# Patient Record
Sex: Male | Born: 1960
Health system: Southern US, Community
[De-identification: ages and names within clinical notes are randomized; demographics above are authoritative.]

## PROBLEM LIST (undated history)

## (undated) DIAGNOSIS — K759 Inflammatory liver disease, unspecified: Secondary | ICD-10-CM

## (undated) DIAGNOSIS — E785 Hyperlipidemia, unspecified: Secondary | ICD-10-CM

## (undated) DIAGNOSIS — J189 Pneumonia, unspecified organism: Secondary | ICD-10-CM

## (undated) DIAGNOSIS — I10 Essential (primary) hypertension: Secondary | ICD-10-CM

## (undated) DIAGNOSIS — R7303 Prediabetes: Secondary | ICD-10-CM

---

## 2018-11-17 ENCOUNTER — Inpatient Hospital Stay (HOSPITAL_COMMUNITY)
Admission: EM | Admit: 2018-11-17 | Discharge: 2018-11-27 | DRG: 177 | Disposition: A | Discharge status: Home / Self Care | Payer: HRSA Program | Attending: Internal Medicine | Admitting: Internal Medicine

## 2018-11-17 ENCOUNTER — Other Ambulatory Visit: Payer: Self-pay

## 2018-11-17 ENCOUNTER — Encounter (HOSPITAL_COMMUNITY): Payer: Self-pay | Admitting: *Deleted

## 2018-11-17 ENCOUNTER — Emergency Department (HOSPITAL_COMMUNITY): Payer: HRSA Program

## 2018-11-17 DIAGNOSIS — R05 Cough: Secondary | ICD-10-CM

## 2018-11-17 DIAGNOSIS — J069 Acute upper respiratory infection, unspecified: Secondary | ICD-10-CM

## 2018-11-17 DIAGNOSIS — R0602 Shortness of breath: Secondary | ICD-10-CM | POA: Diagnosis not present

## 2018-11-17 DIAGNOSIS — R6889 Other general symptoms and signs: Secondary | ICD-10-CM

## 2018-11-17 DIAGNOSIS — J9601 Acute respiratory failure with hypoxia: Secondary | ICD-10-CM | POA: Diagnosis present

## 2018-11-17 DIAGNOSIS — R509 Fever, unspecified: Secondary | ICD-10-CM

## 2018-11-17 DIAGNOSIS — F17210 Nicotine dependence, cigarettes, uncomplicated: Secondary | ICD-10-CM | POA: Diagnosis present

## 2018-11-17 DIAGNOSIS — R059 Cough, unspecified: Secondary | ICD-10-CM

## 2018-11-17 DIAGNOSIS — J1289 Other viral pneumonia: Secondary | ICD-10-CM | POA: Diagnosis present

## 2018-11-17 DIAGNOSIS — Z20822 Contact with and (suspected) exposure to covid-19: Secondary | ICD-10-CM

## 2018-11-17 DIAGNOSIS — J9621 Acute and chronic respiratory failure with hypoxia: Secondary | ICD-10-CM

## 2018-11-17 DIAGNOSIS — E871 Hypo-osmolality and hyponatremia: Secondary | ICD-10-CM | POA: Diagnosis present

## 2018-11-17 DIAGNOSIS — E876 Hypokalemia: Secondary | ICD-10-CM | POA: Diagnosis present

## 2018-11-17 DIAGNOSIS — D6869 Other thrombophilia: Secondary | ICD-10-CM | POA: Diagnosis present

## 2018-11-17 DIAGNOSIS — R6510 Systemic inflammatory response syndrome (SIRS) of non-infectious origin without acute organ dysfunction: Secondary | ICD-10-CM

## 2018-11-17 LAB — CBC WITH DIFFERENTIAL/PLATELET
Abs Immature Granulocytes: 0.04 10*3/uL (ref 0.00–0.07)
Basophils Absolute: 0 10*3/uL (ref 0.0–0.1)
Basophils Relative: 0 %
Eosinophils Absolute: 0 10*3/uL (ref 0.0–0.5)
Eosinophils Relative: 0 %
HCT: 48.4 % (ref 39.0–52.0)
Hemoglobin: 16 g/dL (ref 13.0–17.0)
Immature Granulocytes: 0 %
Lymphocytes Relative: 21 %
Lymphs Abs: 2 10*3/uL (ref 0.7–4.0)
MCH: 32.1 pg (ref 26.0–34.0)
MCHC: 33.1 g/dL (ref 30.0–36.0)
MCV: 97.2 fL (ref 80.0–100.0)
Monocytes Absolute: 0.8 10*3/uL (ref 0.1–1.0)
Monocytes Relative: 9 %
Neutro Abs: 6.5 10*3/uL (ref 1.7–7.7)
Neutrophils Relative %: 70 %
Platelets: 429 10*3/uL — ABNORMAL HIGH (ref 150–400)
RBC: 4.98 MIL/uL (ref 4.22–5.81)
RDW: 12.3 % (ref 11.5–15.5)
WBC: 9.3 10*3/uL (ref 4.0–10.5)
nRBC: 0 % (ref 0.0–0.2)

## 2018-11-17 LAB — TROPONIN I: Troponin I: <0.03 ng/mL (ref <0.03)

## 2018-11-17 LAB — COMPREHENSIVE METABOLIC PANEL
ALT: 68 U/L — ABNORMAL HIGH (ref 0–44)
AST: 64 U/L — ABNORMAL HIGH (ref 15–41)
Albumin: 3.7 g/dL (ref 3.5–5.0)
Alkaline Phosphatase: 82 U/L (ref 38–126)
Anion gap: 13 (ref 5–15)
BUN: 14 mg/dL (ref 6–20)
CO2: 21 mmol/L — ABNORMAL LOW (ref 22–32)
Calcium: 8.8 mg/dL — ABNORMAL LOW (ref 8.9–10.3)
Chloride: 100 mmol/L (ref 98–111)
Creatinine, Ser: 1.13 mg/dL (ref 0.61–1.24)
GFR calc Af Amer: >60 mL/min (ref >60)
GFR calc non Af Amer: >60 mL/min (ref >60)
Glucose, Bld: 132 mg/dL — ABNORMAL HIGH (ref 70–99)
Potassium: 3.7 mmol/L (ref 3.5–5.1)
Sodium: 134 mmol/L — ABNORMAL LOW (ref 135–145)
Total Bilirubin: 1.5 mg/dL — ABNORMAL HIGH (ref 0.3–1.2)
Total Protein: 8.4 g/dL — ABNORMAL HIGH (ref 6.5–8.1)

## 2018-11-17 LAB — D-DIMER, QUANTITATIVE: D-Dimer, Quant: 0.83 ug/mL-FEU — ABNORMAL HIGH (ref 0.00–0.50)

## 2018-11-17 LAB — FERRITIN: Ferritin: 916 ng/mL — ABNORMAL HIGH (ref 24–336)

## 2018-11-17 LAB — LACTATE DEHYDROGENASE: LDH: 223 U/L — ABNORMAL HIGH (ref 98–192)

## 2018-11-17 LAB — C-REACTIVE PROTEIN: CRP: 11.7 mg/dL — ABNORMAL HIGH (ref <1.0)

## 2018-11-17 LAB — SARS CORONAVIRUS 2 BY RT PCR (HOSPITAL ORDER, PERFORMED IN ~~LOC~~ HOSPITAL LAB): SARS Coronavirus 2: POSITIVE — AB

## 2018-11-17 LAB — SEDIMENTATION RATE: Sed Rate: 64 mm/hr — ABNORMAL HIGH (ref 0–16)

## 2018-11-17 MED ORDER — HYDROXYCHLOROQUINE SULFATE 200 MG PO TABS: 200.0000 mg | ORAL_TABLET | Freq: Two times a day (BID) | ORAL | Status: DC

## 2018-11-17 MED ORDER — HYDROXYCHLOROQUINE SULFATE 200 MG PO TABS: 400.0000 mg | ORAL_TABLET | Freq: Two times a day (BID) | ORAL | Status: DC

## 2018-11-17 MED ORDER — AEROCHAMBER PLUS FLO-VU MEDIUM MISC
1.0000 | Freq: Once | Status: AC
  Administered 2018-11-17: 11:00:00 1
  Filled 2018-11-17: qty 1

## 2018-11-17 MED ORDER — ACETAMINOPHEN 325 MG PO TABS
650.0000 mg | ORAL_TABLET | Freq: Four times a day (QID) | ORAL | Status: DC | PRN
  Administered 2018-11-18 – 2018-11-22 (×5): 650 mg via ORAL
  Filled 2018-11-17 (×5): qty 2

## 2018-11-17 MED ORDER — ONDANSETRON HCL 4 MG/2ML IJ SOLN: 4.0000 mg | Freq: Four times a day (QID) | INTRAMUSCULAR | Status: DC | PRN

## 2018-11-17 MED ORDER — SODIUM CHLORIDE 0.9 % IV SOLN
500.0000 mg | Freq: Once | INTRAVENOUS | Status: AC
  Administered 2018-11-17: 12:00:00 500 mg via INTRAVENOUS
  Filled 2018-11-17: qty 500

## 2018-11-17 MED ORDER — HYDROXYCHLOROQUINE SULFATE 200 MG PO TABS
200.0000 mg | ORAL_TABLET | Freq: Two times a day (BID) | ORAL | Status: AC
  Administered 2018-11-18 – 2018-11-22 (×8): 200 mg via ORAL
  Filled 2018-11-17 (×9): qty 1

## 2018-11-17 MED ORDER — ALBUTEROL SULFATE HFA 108 (90 BASE) MCG/ACT IN AERS
2.0000 | INHALATION_SPRAY | Freq: Once | RESPIRATORY_TRACT | Status: AC
  Administered 2018-11-17: 11:00:00 2 via RESPIRATORY_TRACT
  Filled 2018-11-17: qty 6.7

## 2018-11-17 MED ORDER — ENOXAPARIN SODIUM 40 MG/0.4ML ~~LOC~~ SOLN
40.0000 mg | SUBCUTANEOUS | Status: DC
  Administered 2018-11-17 – 2018-11-23 (×7): 40 mg via SUBCUTANEOUS
  Filled 2018-11-17 (×7): qty 0.4

## 2018-11-17 MED ORDER — SODIUM CHLORIDE 0.9 % IV SOLN
1.0000 g | Freq: Once | INTRAVENOUS | Status: AC
  Administered 2018-11-17: 12:00:00 1 g via INTRAVENOUS
  Filled 2018-11-17: qty 10

## 2018-11-17 MED ORDER — SODIUM CHLORIDE 0.9 % IV SOLN
500.0000 mg | INTRAVENOUS | Status: DC
  Administered 2018-11-18 – 2018-11-24 (×7): 500 mg via INTRAVENOUS
  Filled 2018-11-17 (×8): qty 500

## 2018-11-17 MED ORDER — ALBUTEROL SULFATE HFA 108 (90 BASE) MCG/ACT IN AERS
2.0000 | INHALATION_SPRAY | Freq: Four times a day (QID) | RESPIRATORY_TRACT | Status: DC
  Administered 2018-11-17 – 2018-11-18 (×2): 2 via RESPIRATORY_TRACT
  Filled 2018-11-17: qty 6.7

## 2018-11-17 MED ORDER — NICOTINE 21 MG/24HR TD PT24
21.0000 mg | MEDICATED_PATCH | Freq: Every day | TRANSDERMAL | Status: DC
  Administered 2018-11-17 – 2018-11-27 (×11): 21 mg via TRANSDERMAL
  Filled 2018-11-17 (×13): qty 1

## 2018-11-17 MED ORDER — HYDROXYCHLOROQUINE SULFATE 200 MG PO TABS
400.0000 mg | ORAL_TABLET | Freq: Two times a day (BID) | ORAL | Status: AC
  Administered 2018-11-17 – 2018-11-18 (×2): 400 mg via ORAL
  Filled 2018-11-17: qty 2

## 2018-11-17 MED ORDER — SODIUM CHLORIDE 0.9 % IV SOLN
1.0000 g | INTRAVENOUS | Status: DC
  Administered 2018-11-18 – 2018-11-25 (×8): 1 g via INTRAVENOUS
  Filled 2018-11-17: qty 1
  Filled 2018-11-17: qty 10
  Filled 2018-11-17 (×2): qty 1
  Filled 2018-11-17: qty 10
  Filled 2018-11-17 (×3): qty 1

## 2018-11-17 MED ORDER — ALBUTEROL SULFATE HFA 108 (90 BASE) MCG/ACT IN AERS
2.0000 | INHALATION_SPRAY | Freq: Once | RESPIRATORY_TRACT | Status: AC
  Administered 2018-11-17: 12:00:00 2 via RESPIRATORY_TRACT
  Filled 2018-11-17: qty 6.7

## 2018-11-17 MED ORDER — ONDANSETRON HCL 4 MG PO TABS: 4.0000 mg | ORAL_TABLET | Freq: Four times a day (QID) | ORAL | Status: DC | PRN

## 2018-11-17 MED ORDER — POLYVINYL ALCOHOL 1.4 % OP SOLN
1.0000 [drp] | OPHTHALMIC | Status: DC | PRN
  Filled 2018-11-17 (×2): qty 15

## 2018-11-17 NOTE — ED Notes (Signed)
Report given to [facility]

## 2018-11-17 NOTE — ED Notes (Signed)
ED TO INPATIENT HANDOFF REPORT  ED Nurse Name and Phone #: [phone]  S Name/Age/Gender Micheal Watson 58 y.o. male Room/Bed: WA20/WA20  Code Status   Code Status: Not on file  Home/SNF/Other Home Patient oriented to: self, place, time and situation Is this baseline? Yes   Triage Complete: Triage complete  Chief Complaint headache;chest pain  Triage Note Pt complains of cough, shortness of breath, fever, body aches for the past 5 days. Pt states all 4 people that live with him have the same symptoms   Allergies No Known Allergies  Level of Care/Admitting Diagnosis ED Disposition    ED Disposition Condition [place]: [place] [[id number]]  Level of Care: Med-Surg [16]  Diagnosis: Suspected Covid-19 Virus Infection [[id number]]  Admitting Physician: Hosie Poisson [[id number]]  Attending Physician: Hosie Poisson [[id number]]  Possible Covid Disease Patient Isolation: High Risk (Aerosolizing procedure, nebulizer, intubated/ventilation, CPAP/BiPAP)  PT Class (Do Not Modify): Observation [104]  PT Acc Code (Do Not Modify): Observation [10022]       B Medical/Surgery History History reviewed. No pertinent past medical history. History reviewed. No pertinent surgical history.   A IV Location/Drains/Wounds Patient Lines/Drains/Airways Status   Active Line/Drains/Airways    Name:   Placement date:   Placement time:   Site:   Days:   Peripheral IV 11/17/18 Right Antecubital   11/17/18    1014    Antecubital   less than 1          Intake/Output Last 24 hours No intake or output data in the 24 hours ending 11/17/18 1342  Labs/Imaging Results for orders placed or performed during the hospital encounter of 11/17/18 (from the past 48 hour(s))  CBC with Differential     Status: Abnormal   Collection Time: 11/17/18  9:51 AM  Result Value Ref Range   WBC 9.3 4.0 - 10.5 K/uL   RBC 4.98 4.22 - 5.81 MIL/uL   Hemoglobin 16.0 13.0 -  17.0 g/dL   HCT 48.4 39.0 - 52.0 %   MCV 97.2 80.0 - 100.0 fL   MCH 32.1 26.0 - 34.0 pg   MCHC 33.1 30.0 - 36.0 g/dL   RDW 12.3 11.5 - 15.5 %   Platelets 429 (H) 150 - 400 K/uL   nRBC 0.0 0.0 - 0.2 %   Neutrophils Relative % 70 %   Neutro Abs 6.5 1.7 - 7.7 K/uL   Lymphocytes Relative 21 %   Lymphs Abs 2.0 0.7 - 4.0 K/uL   Monocytes Relative 9 %   Monocytes Absolute 0.8 0.1 - 1.0 K/uL   Eosinophils Relative 0 %   Eosinophils Absolute 0.0 0.0 - 0.5 K/uL   Basophils Relative 0 %   Basophils Absolute 0.0 0.0 - 0.1 K/uL   Immature Granulocytes 0 %   Abs Immature Granulocytes 0.04 0.00 - 0.07 K/uL    Comment: Performed at [facility], [place] [address], [place], [place] [address]  Comprehensive metabolic panel     Status: Abnormal   Collection Time: 11/17/18  9:51 AM  Result Value Ref Range   Sodium 134 (L) 135 - 145 mmol/L   Potassium 3.7 3.5 - 5.1 mmol/L   Chloride 100 98 - 111 mmol/L   CO2 21 (L) 22 - 32 mmol/L   Glucose, Bld 132 (H) 70 - 99 mg/dL   BUN 14 6 - 20 mg/dL   Creatinine, Ser 1.13 0.61 - 1.24 mg/dL   Calcium 8.8 (L) 8.9 -  10.3 mg/dL   Total Protein 8.4 (H) 6.5 - 8.1 g/dL   Albumin 3.7 3.5 - 5.0 g/dL   AST 64 (H) 15 - 41 U/L   ALT 68 (H) 0 - 44 U/L   Alkaline Phosphatase 82 38 - 126 U/L   Total Bilirubin 1.5 (H) 0.3 - 1.2 mg/dL   GFR calc non Af Amer >60 >60 mL/min   GFR calc Af Amer >60 >60 mL/min   Anion gap 13 5 - 15    Comment: Performed at [facility], [place] [address] [place], [place] [address]  Troponin I - Once     Status: None   Collection Time: 11/17/18  9:51 AM  Result Value Ref Range   Troponin I <0.03 <0.03 ng/mL    Comment: Performed at [facility], [place] [address], [place], [place] [address]   Dg Chest Port 1 View  Result Date: 11/17/2018 CLINICAL DATA:  Cough, shortness of breath and fever over the last 5 days. EXAM: PORTABLE CHEST 1 VIEW COMPARISON:  None FINDINGS: Heart size is normal.  Mediastinal shadows are normal. There is abnormal density in the left lateral costophrenic angle which could be due to lingular or lower lobe pneumonia. Question areas of hazy patchy infiltrate on the right, not definite. IMPRESSION: Abnormal density at the left lateral costophrenic angle worrisome for lingular or lower lobe pneumonia. Question other scattered areas of hazy infiltrate in the right, not definite. Electronically Signed   By: Nelson Chimes M.D.   On: 11/17/2018 10:33    Pending Labs Unresulted Labs (From admission, onward)    Start     Ordered   11/18/18 0500  CBC with Differential/Platelet  Tomorrow morning,   R     11/17/18 1341   11/18/18 0500  Comprehensive metabolic panel  Tomorrow morning,   R     11/17/18 1341   11/17/18 1339  Culture, blood (Routine X 2) w Reflex to ID Panel  BLOOD CULTURE X 2,   R     11/17/18 1338   11/17/18 1200  SARS Coronavirus 2 [facility] order, Performed in [facility] hospital lab)  (Novel Coronavirus, NAA ([place]) with precautions panel)  Once,   R     11/17/18 1200   Signed and Held  Creatinine, serum  (enoxaparin (LOVENOX)    CrCl >/= 30 ml/min)  Weekly,   R    Comments:  while on enoxaparin therapy    Signed and Held   Signed and Held  Interleukin-6, Plasma  Once,   R     Signed and Held   Signed and Held  Culture, sputum-assessment  Once,   R    Question:  Patient immune status  Answer:  Normal   Signed and Held   Signed and Held  Legionella Pneumophila Serogp 1 Ur Ag  Once,   R     Signed and Held   Signed and Held  Strep pneumoniae urinary antigen  Once,   R     Signed and Held   Visual merchandiser and Held  C-reactive protein  Daily,   R     Signed and Held   Signed and Held  CK  Daily,   R     Signed and Held   Signed and Held  Lactate dehydrogenase  Once,   R     Signed and Held   Signed and Held  Ferritin  Once,   R     Signed and  Held   Signed and Held  D-dimer, quantitative (not at [facility])  Once,   R     Signed and Held    Signed and Held  Sedimentation rate  Once,   R     Signed and Held   Signed and Held  C-reactive protein  Once,   R     Signed and Held          Vitals/Pain Today's Vitals   11/17/18 1140 11/17/18 1300 11/17/18 1307 11/17/18 1330  BP:  (!) 135/91 (!) 135/91   Pulse: 100 96 99 96  Resp: 18 (!) 21 16 (!) 27  Temp:      TempSrc:      SpO2: 91% 91% 92% 96%  PainSc:        Isolation Precautions Droplet and Contact precautions  Medications Medications  azithromycin (ZITHROMAX) 500 mg in sodium chloride 0.9 % 250 mL IVPB (has no administration in time range)  cefTRIAXone (ROCEPHIN) 1 g in sodium chloride 0.9 % 100 mL IVPB (has no administration in time range)  nicotine (NICODERM CQ - dosed in mg/24 hours) patch 21 mg (has no administration in time range)  albuterol (PROVENTIL HFA;VENTOLIN HFA) 108 (90 Base) MCG/ACT inhaler 2 puff (2 puffs Inhalation Given 11/17/18 1033)  AeroChamber Plus Flo-Vu Medium MISC 1 each (1 each Other Given 11/17/18 1033)  albuterol (PROVENTIL HFA;VENTOLIN HFA) 108 (90 Base) MCG/ACT inhaler 2 puff (2 puffs Inhalation Given 11/17/18 1200)  cefTRIAXone (ROCEPHIN) 1 g in sodium chloride 0.9 % 100 mL IVPB (1 g Intravenous New Bag/Given 11/17/18 1200)  azithromycin (ZITHROMAX) 500 mg in sodium chloride 0.9 % 250 mL IVPB (500 mg Intravenous New Bag/Given 11/17/18 1224)    Mobility walks     Focused Assessments Pulmonary Assessment Handoff:  Lung sounds: Bilateral Breath Sounds: Diminished O2 Device: Room Air O2 Flow Rate (L/min): 2 L/min      R Recommendations: See Admitting Provider Note  Report given to:   Additional Notes:

## 2018-11-17 NOTE — ED Notes (Signed)
Inhaler demonstration provided to the pt via video interpreter. Pt verbalizes understanding and denies any questions.

## 2018-11-17 NOTE — ED Provider Notes (Addendum)
[facility] DEPT Provider Note   CSN: [id number] Arrival date & time: 11/17/18  0931    History   Chief Complaint Chief Complaint  Patient presents with  . Cough  . Fever  . Shortness of Breath    HPI Micheal Watson is a 58 y.o. male.     The history is provided by the patient. A language interpreter was used ([place]).     Micheal Watson is a 58 y.o. male, with a history of tobacco use, presenting to the ED with productive cough for the past 3 to 4 days.  Accompanied by shortness of breath, subjective fever, body aches, and chest discomfort.  Chest discomfort is only present with coughing, is mild, and dissipates immediately. He has continued to work.  His housemates have similar symptoms. Denies syncope, N/V/D, consistent or exertional chest pain, dizziness, abdominal pain, upper respiratory systems, hemoptysis, or any other complaints.    History reviewed. No pertinent past medical history.  Patient Active Problem List   Diagnosis Date Noted  . Suspected Covid-19 Virus Infection 11/17/2018    History reviewed. No pertinent surgical history.      Home Medications    Prior to Admission medications   Medication Sig Start Date End Date Taking? Authorizing Provider  polyvinyl alcohol (LIQUIFILM TEARS) 1.4 % ophthalmic solution Place 1 drop into both eyes as needed for dry eyes (allergies).   Yes [provider]    Family History No family history on file.  Social History Social History   Tobacco Use  . Smoking status: Current Every Day Smoker    Packs/day: 0.30    Types: Cigarettes  Substance Use Topics  . Alcohol use: Not on file  . Drug use: Not on file     Allergies   Patient has no known allergies.   Review of Systems Review of Systems  Constitutional: Positive for fever.  HENT: Negative for congestion, rhinorrhea, sinus pressure, sinus pain, sore throat and trouble swallowing.    Respiratory: Positive for cough and shortness of breath.   Cardiovascular: Negative for leg swelling.  Gastrointestinal: Negative for abdominal pain, blood in stool, diarrhea, nausea and vomiting.  Genitourinary: Negative for dysuria and flank pain.  Musculoskeletal: Positive for myalgias. Negative for neck pain and neck stiffness.       Chest soreness with coughing.  Skin: Negative for color change and rash.  Neurological: Negative for dizziness, syncope, weakness, light-headedness and headaches.  All other systems reviewed and are negative.    Physical Exam Updated Vital Signs BP (!) 155/87 (BP Location: Left Arm)   Pulse 84   Temp 98.8 F (37.1 C) (Oral)   Resp (!) 22   Ht 5\' 9"  (1.753 m)   Wt 92.4 kg   SpO2 96%   BMI 30.08 kg/m   Physical Exam Vitals signs and nursing note reviewed.  Constitutional:      General: He is not in acute distress.    Appearance: He is well-developed. He is not diaphoretic.  HENT:     Head: Normocephalic and atraumatic.     Nose: Nose normal.     Mouth/Throat:     Mouth: Mucous membranes are moist.     Pharynx: Oropharynx is clear.  Eyes:     Conjunctiva/sclera: Conjunctivae normal.     Pupils: Pupils are equal, round, and reactive to light.  Neck:     Musculoskeletal: Normal range of motion and neck supple.  Cardiovascular:     Rate and Rhythm:  Normal rate and regular rhythm.     Pulses: Normal pulses.          Radial pulses are 2+ on the right side and 2+ on the left side.       Posterior tibial pulses are 2+ on the right side and 2+ on the left side.     Heart sounds: Normal heart sounds.     Comments: Tactile temperature in the extremities appropriate and equal bilaterally. Pulmonary:     Effort: Pulmonary effort is normal. No respiratory distress.     Breath sounds: Normal breath sounds.     Comments: No increased work of breathing.  Speaks in full sentences without difficulty.  SPO2 92 to 94% on room air. Abdominal:      Palpations: Abdomen is soft.     Tenderness: There is no abdominal tenderness. There is no guarding.  Musculoskeletal:     Right lower leg: No edema.     Left lower leg: No edema.  Lymphadenopathy:     Cervical: No cervical adenopathy.  Skin:    General: Skin is warm and dry.  Neurological:     Mental Status: He is alert and oriented to person, place, and time.  Psychiatric:        Mood and Affect: Mood and affect normal.        Speech: Speech normal.        Behavior: Behavior normal.      ED Treatments / Results  Labs (all labs ordered are listed, but only abnormal results are displayed) Labs Reviewed  SARS CORONAVIRUS 2 (HOSPITAL ORDER, [facility]) - Abnormal; Notable for the following components:      Result Value   SARS Coronavirus 2 POSITIVE (*)    All other components within normal limits  CBC WITH DIFFERENTIAL/PLATELET - Abnormal; Notable for the following components:   Platelets 429 (*)    All other components within normal limits  COMPREHENSIVE METABOLIC PANEL - Abnormal; Notable for the following components:   Sodium 134 (*)    CO2 21 (*)    Glucose, Bld 132 (*)    Calcium 8.8 (*)    Total Protein 8.4 (*)    AST 64 (*)    ALT 68 (*)    Total Bilirubin 1.5 (*)    All other components within normal limits  D-DIMER, QUANTITATIVE (NOT AT [facility]) - Abnormal; Notable for the following components:   D-Dimer, Quant 0.83 (*)    All other components within normal limits  C-REACTIVE PROTEIN - Abnormal; Notable for the following components:   CRP 11.7 (*)    All other components within normal limits  CULTURE, BLOOD (ROUTINE X 2)  CULTURE, BLOOD (ROUTINE X 2)  EXPECTORATED SPUTUM ASSESSMENT W REFEX TO RESP CULTURE  TROPONIN I  INTERLEUKIN-6, PLASMA  LEGIONELLA PNEUMOPHILA SEROGP 1 UR AG  STREP PNEUMONIAE URINARY ANTIGEN  FERRITIN  SEDIMENTATION RATE  LACTATE DEHYDROGENASE    EKG EKG Interpretation  Date/Time:  Tuesday November 17 2018  10:31:11 EDT Ventricular Rate:  85 PR Interval:    QRS Duration: 92 QT Interval:  387 QTC Calculation: 461 R Axis:   48 Text Interpretation:  Sinus rhythm No acute changes No significant change since last tracing No old tracing to compare Confirmed by Varney Biles ([id number]) on 11/17/2018 11:28:21 AM   Radiology Dg Chest Port 1 View  Result Date: 11/17/2018 CLINICAL DATA:  Cough, shortness of breath and fever over the last 5 days. EXAM:  PORTABLE CHEST 1 VIEW COMPARISON:  None FINDINGS: Heart size is normal. Mediastinal shadows are normal. There is abnormal density in the left lateral costophrenic angle which could be due to lingular or lower lobe pneumonia. Question areas of hazy patchy infiltrate on the right, not definite. IMPRESSION: Abnormal density at the left lateral costophrenic angle worrisome for lingular or lower lobe pneumonia. Question other scattered areas of hazy infiltrate in the right, not definite. Electronically Signed   By: Nelson Chimes M.D.   On: 11/17/2018 10:33    Procedures Procedures (including critical care time)  Medications Ordered in ED Medications  polyvinyl alcohol (LIQUIFILM TEARS) 1.4 % ophthalmic solution 1 drop (has no administration in time range)  enoxaparin (LOVENOX) injection 40 mg (has no administration in time range)  albuterol (PROVENTIL HFA;VENTOLIN HFA) 108 (90 Base) MCG/ACT inhaler 2 puff (has no administration in time range)  acetaminophen (TYLENOL) tablet 650 mg (has no administration in time range)  ondansetron (ZOFRAN) tablet 4 mg (has no administration in time range)    Or  ondansetron (ZOFRAN) injection 4 mg (has no administration in time range)  azithromycin (ZITHROMAX) 500 mg in sodium chloride 0.9 % 250 mL IVPB (has no administration in time range)  cefTRIAXone (ROCEPHIN) 1 g in sodium chloride 0.9 % 100 mL IVPB (has no administration in time range)  nicotine (NICODERM CQ - dosed in mg/24 hours) patch 21 mg (21 mg Transdermal Patch  Applied 11/17/18 1439)  albuterol (PROVENTIL HFA;VENTOLIN HFA) 108 (90 Base) MCG/ACT inhaler 2 puff (2 puffs Inhalation Given 11/17/18 1033)  AeroChamber Plus Flo-Vu Medium MISC 1 each (1 each Other Given 11/17/18 1033)  albuterol (PROVENTIL HFA;VENTOLIN HFA) 108 (90 Base) MCG/ACT inhaler 2 puff (2 puffs Inhalation Given 11/17/18 1200)  cefTRIAXone (ROCEPHIN) 1 g in sodium chloride 0.9 % 100 mL IVPB (0 g Intravenous Stopping Infusion hung by another clincian 11/17/18 1300)  azithromycin (ZITHROMAX) 500 mg in sodium chloride 0.9 % 250 mL IVPB (500 mg Intravenous New Bag/Given 11/17/18 1224)     Initial Impression / Assessment and Plan / ED Course  I have reviewed the triage vital signs and the nursing notes.  Pertinent labs & imaging results that were available during my care of the patient were reviewed by me and considered in my medical decision making (see chart for details).  Clinical Course as of Nov 16 1544  Tue Nov 17, 2018  1105 Patient reevaluated following albuterol inhaler treatment.  SP02 at 86%.  No increased work of breathing.  Patient places improvement in his shortness of breath.   [SJ]  1133 This may be due to dehydration with concurrent decrease in CO2 level. COVID-19 infection is a possibility for this patient and some LFT changes have been noted in other patients. He has no pain or tenderness in the right upper quadrant.  Total Bilirubin(!): 1.5 [SJ]  1203 Spoke with Dr. Karleen Hampshire, hospitalist. Agrees to admit the patient.   [SJ]  9150 Resulted after patient admitted and out of the ED.  SARS Coronavirus 2(!): POSITIVE [SJ]    Clinical Course User Index [SJ] Shawny Borkowski C, PA-C       Patient presents with cough and shortness of breath.  He is noted to have SPO2 in the high 80s to low 90s while on room air, though he is nontoxic-appearing and in no noted distress.  Required 2 L supplemental O2 to maintain adequate SPO2.  Patient voiced improvement with 4 puffs from albuterol  inhaler, though there is no improvement  in his SPO2. Chest x-ray with evidence of pneumonia.  There is suspicion for COVID-19 infection in this patient.  Due to his new oxygen requirement, he will be admitted to the hospital.  Findings and plan of care discussed with Varney Biles, MD. Dr. Kathrynn Humble personally evaluated and examined this patient.  Micheal Watson was evaluated in Emergency Department on 11/17/2018 for the symptoms described in the history of present illness. He was evaluated in the context of the global COVID-19 pandemic, which necessitated consideration that the patient might be at risk for infection with the SARS-CoV-2 virus that causes COVID-19. Institutional protocols and algorithms that pertain to the evaluation of patients at risk for COVID-19 are in a state of rapid change based on information released by regulatory bodies including the CDC and federal and state organizations. These policies and algorithms were followed during the patient's care in the ED.  Vitals:   11/17/18 0943 11/17/18 1045 11/17/18 1100 11/17/18 1140  BP: (!) 146/105  122/81   Pulse: 85 94 89 100  Resp: 18 (!) 23 (!) 21 18  Temp: 99.3 F (37.4 C)     TempSrc: Oral     SpO2: 92% (!) 86% (!) 86% 91%     Final Clinical Impressions(s) / ED Diagnoses   Final diagnoses:  Cough  Shortness of breath  Suspected Covid-19 Virus Infection    ED Discharge Orders    None       Layla Maw 11/17/18 1214    Lorayne Bender, PA-C 11/17/18 [place], Ankit, MD 11/20/18 1727

## 2018-11-17 NOTE — ED Provider Notes (Signed)
[facility] DEPT Provider Note   CSN: [id number] Arrival date & time: 11/17/18  0931    History   Chief Complaint Chief Complaint  Patient presents with  . Cough  . Fever  . Shortness of Breath    HPI Micheal Watson is a 58 y.o. male.     The history is provided by the patient. A language interpreter was used ([place]).    Micheal Watson is a 58 y.o. male, with a history of tobacco use, presenting to the ED with productive cough for the past 3 to 4 days.  Accompanied by shortness of breath, subjective fever, body aches, and chest discomfort.  Chest discomfort is only present with coughing, is mild, and dissipates immediately. He has continued to work.  His housemates have similar symptoms. Denies syncope, N/V/D, consistent or exertional chest pain, dizziness, abdominal pain, upper respiratory systems, hemoptysis, or any other complaints.  History reviewed. No pertinent past medical history.  Patient Active Problem List   Diagnosis Date Noted  . Suspected Covid-19 Virus Infection 11/17/2018    History reviewed. No pertinent surgical history.      Home Medications    Prior to Admission medications   Medication Sig Start Date End Date Taking? Authorizing Provider  polyvinyl alcohol (LIQUIFILM TEARS) 1.4 % ophthalmic solution Place 1 drop into both eyes as needed for dry eyes (allergies).   Yes [provider]    Family History No family history on file.  Social History Social History   Tobacco Use  . Smoking status: Current Every Day Smoker    Packs/day: 0.30    Types: Cigarettes  Substance Use Topics  . Alcohol use: Not on file  . Drug use: Not on file     Allergies   Patient has no known allergies.   Review of Systems Review of Systems  Constitutional: Positive for fever. Negative for diaphoresis.  HENT: Negative for congestion, rhinorrhea, sore throat and trouble swallowing.   Respiratory:  Positive for cough and shortness of breath.   Gastrointestinal: Negative for abdominal pain, blood in stool, diarrhea, nausea and vomiting.  Musculoskeletal: Positive for myalgias.       Chest soreness with coughing  Neurological: Negative for dizziness, syncope and weakness.  Psychiatric/Behavioral: Negative for confusion.  All other systems reviewed and are negative.    Physical Exam Updated Vital Signs BP (!) 146/105 (BP Location: Left Arm)   Pulse 85   Temp 99.3 F (37.4 C) (Oral)   Resp 18   SpO2 92%   Physical Exam Vitals signs and nursing note reviewed.  Constitutional:      General: He is not in acute distress.    Appearance: He is well-developed. He is not diaphoretic.  HENT:     Head: Normocephalic and atraumatic.     Nose: Nose normal.     Mouth/Throat:     Mouth: Mucous membranes are moist.     Pharynx: Oropharynx is clear.  Eyes:     Conjunctiva/sclera: Conjunctivae normal.  Neck:     Musculoskeletal: Neck supple.  Cardiovascular:     Rate and Rhythm: Normal rate and regular rhythm.     Pulses: Normal pulses.     Heart sounds: Normal heart sounds.     Comments: Tactile temperature in the extremities appropriate and equal bilaterally. Pulmonary:     Effort: Pulmonary effort is normal. No respiratory distress.     Breath sounds: Normal breath sounds.     Comments: No increased work of  breathing.  Speaks in full sentences without difficulty.  SPO2 92 to 94% on room air. Abdominal:     Palpations: Abdomen is soft.     Tenderness: There is no abdominal tenderness. There is no guarding.  Musculoskeletal:     Right lower leg: No edema.     Left lower leg: No edema.  Lymphadenopathy:     Cervical: No cervical adenopathy.  Skin:    General: Skin is warm and dry.  Neurological:     Mental Status: He is alert.  Psychiatric:        Mood and Affect: Mood and affect normal.        Speech: Speech normal.        Behavior: Behavior normal.      ED Treatments  / Results  Labs (all labs ordered are listed, but only abnormal results are displayed) Labs Reviewed  SARS CORONAVIRUS 2 (HOSPITAL ORDER, [facility]) - Abnormal; Notable for the following components:      Result Value   SARS Coronavirus 2 POSITIVE (*)    All other components within normal limits  CBC WITH DIFFERENTIAL/PLATELET - Abnormal; Notable for the following components:   Platelets 429 (*)    All other components within normal limits  COMPREHENSIVE METABOLIC PANEL - Abnormal; Notable for the following components:   Sodium 134 (*)    CO2 21 (*)    Glucose, Bld 132 (*)    Calcium 8.8 (*)    Total Protein 8.4 (*)    AST 64 (*)    ALT 68 (*)    Total Bilirubin 1.5 (*)    All other components within normal limits  D-DIMER, QUANTITATIVE (NOT AT [facility]) - Abnormal; Notable for the following components:   D-Dimer, Quant 0.83 (*)    All other components within normal limits  C-REACTIVE PROTEIN - Abnormal; Notable for the following components:   CRP 11.7 (*)    All other components within normal limits  CULTURE, BLOOD (ROUTINE X 2)  CULTURE, BLOOD (ROUTINE X 2)  EXPECTORATED SPUTUM ASSESSMENT W REFEX TO RESP CULTURE  TROPONIN I  INTERLEUKIN-6, PLASMA  LEGIONELLA PNEUMOPHILA SEROGP 1 UR AG  STREP PNEUMONIAE URINARY ANTIGEN  FERRITIN  SEDIMENTATION RATE  LACTATE DEHYDROGENASE    EKG EKG Interpretation  Date/Time:  Tuesday November 17 2018 10:31:11 EDT Ventricular Rate:  85 PR Interval:    QRS Duration: 92 QT Interval:  387 QTC Calculation: 461 R Axis:   48 Text Interpretation:  Sinus rhythm No acute changes No significant change since last tracing No old tracing to compare Confirmed by Varney Biles ([id number]) on 11/17/2018 11:28:21 AM   Radiology Dg Chest Port 1 View  Result Date: 11/17/2018 CLINICAL DATA:  Cough, shortness of breath and fever over the last 5 days. EXAM: PORTABLE CHEST 1 VIEW COMPARISON:  None FINDINGS: Heart size is normal.  Mediastinal shadows are normal. There is abnormal density in the left lateral costophrenic angle which could be due to lingular or lower lobe pneumonia. Question areas of hazy patchy infiltrate on the right, not definite. IMPRESSION: Abnormal density at the left lateral costophrenic angle worrisome for lingular or lower lobe pneumonia. Question other scattered areas of hazy infiltrate in the right, not definite. Electronically Signed   By: Nelson Chimes M.D.   On: 11/17/2018 10:33    Procedures Procedures (including critical care time)  Medications Ordered in ED Medications  polyvinyl alcohol (LIQUIFILM TEARS) 1.4 % ophthalmic solution 1 drop (has no  administration in time range)  enoxaparin (LOVENOX) injection 40 mg (has no administration in time range)  albuterol (PROVENTIL HFA;VENTOLIN HFA) 108 (90 Base) MCG/ACT inhaler 2 puff (has no administration in time range)  acetaminophen (TYLENOL) tablet 650 mg (has no administration in time range)  ondansetron (ZOFRAN) tablet 4 mg (has no administration in time range)    Or  ondansetron (ZOFRAN) injection 4 mg (has no administration in time range)  azithromycin (ZITHROMAX) 500 mg in sodium chloride 0.9 % 250 mL IVPB (has no administration in time range)  cefTRIAXone (ROCEPHIN) 1 g in sodium chloride 0.9 % 100 mL IVPB (has no administration in time range)  nicotine (NICODERM CQ - dosed in mg/24 hours) patch 21 mg (21 mg Transdermal Patch Applied 11/17/18 1439)  albuterol (PROVENTIL HFA;VENTOLIN HFA) 108 (90 Base) MCG/ACT inhaler 2 puff (2 puffs Inhalation Given 11/17/18 1033)  AeroChamber Plus Flo-Vu Medium MISC 1 each (1 each Other Given 11/17/18 1033)  albuterol (PROVENTIL HFA;VENTOLIN HFA) 108 (90 Base) MCG/ACT inhaler 2 puff (2 puffs Inhalation Given 11/17/18 1200)  cefTRIAXone (ROCEPHIN) 1 g in sodium chloride 0.9 % 100 mL IVPB (0 g Intravenous Stopping Infusion hung by another clincian 11/17/18 1300)  azithromycin (ZITHROMAX) 500 mg in sodium  chloride 0.9 % 250 mL IVPB (500 mg Intravenous New Bag/Given 11/17/18 1224)     Initial Impression / Assessment and Plan / ED Course  I have reviewed the triage vital signs and the nursing notes.  Pertinent labs & imaging results that were available during my care of the patient were reviewed by me and considered in my medical decision making (see chart for details).  Clinical Course as of Nov 17 1538  Tue Nov 17, 2018  1105 Patient reevaluated following albuterol inhaler treatment.  SP02 at 86%.  No increased work of breathing.  Patient places improvement in his shortness of breath.   [SJ]  1133 This may be due to dehydration with concurrent decrease in CO2 level. COVID-19 infection is a possibility for this patient and some LFT changes have been noted in other patients. He has no pain or tenderness in the right upper quadrant.  Total Bilirubin(!): 1.5 [SJ]  1203 Spoke with Dr. Karleen Hampshire, hospitalist. Agrees to admit the patient.   [SJ]  0630 Resulted after patient admitted and out of the ED.  SARS Coronavirus 2(!): POSITIVE [SJ]    Clinical Course User Index [SJ] Dominica Kent C, PA-C       Patient presents with cough and shortness of breath. Patient is nontoxic appearing, afebrile, not tachycardic, not tachypneic, not hypotensive, and is in no apparent distress.  Despite this, SPO2 noted to range from high 80s to low 90s on room air.  Patient voiced subjective improvement with albuterol inhaler, but no improvement in SPO2 until supplemental oxygen was added to patient's treatment. With 2 L supplemental O2, patient's SPO2 improved to 96%. Chest x-ray with evidence of pneumonia.  Suspicion for COVID-19 infection in this patient.  Thankfully no evidence of leukopenia, lymphopenia, or thrombocytopenia.   Patient admitted to the hospital for new oxygen requirement.  Findings and plan of care discussed with Varney Biles, MD. Dr. Kathrynn Humble personally evaluated and examined this patient.   Jonathyn Parrilla was evaluated in Emergency Department on 11/17/2018 for the symptoms described in the history of present illness. He was evaluated in the context of the global COVID-19 pandemic, which necessitated consideration that the patient might be at risk for infection with the SARS-CoV-2 virus that causes COVID-19. Institutional protocols  and algorithms that pertain to the evaluation of patients at risk for COVID-19 are in a state of rapid change based on information released by regulatory bodies including the CDC and federal and state organizations. These policies and algorithms were followed during the patient's care in the ED.    Final Clinical Impressions(s) / ED Diagnoses   Final diagnoses:  Cough  Shortness of breath  Suspected Covid-19 Virus Infection    ED Discharge Orders    None       Layla Maw 11/17/18 1545    Varney Biles, MD 11/20/18 1721

## 2018-11-17 NOTE — ED Notes (Signed)
Pt O2 saturation was 88-92 % on RA pt reposition upright given two more puffs of inhaler and placed on O2 therapy and o2 saturations improved to 100%. Pt appears comfortable and in breathing unlabored.

## 2018-11-17 NOTE — ED Triage Notes (Signed)
Pt complains of cough, shortness of breath, fever, body aches for the past 5 days. Pt states all 4 people that live with him have the same symptoms

## 2018-11-17 NOTE — ED Notes (Signed)
X-ray at bedside

## 2018-11-17 NOTE — ED Notes (Signed)
EDP at bedside  

## 2018-11-17 NOTE — H&P (Signed)
History and Physical    Micheal Watson HER:[id number] DOB: 03/04/1961 DOA: 11/17/2018  PCP: Patient, No Pcp Per  Patient coming from: Home.   I have personally briefly reviewed patient's old medical records in [place]  Chief Complaint: fever, sob and cough since 2 days.   HPI: Micheal Watson is a 58 y.o. male with no significant past medical history presents to ED with fever , chills, myalgias, sob at rest and on exertion and cough, no productive since 2 to 3 days. Pt reports 3 of his family members have the same symptoms. He reports smoking daily up to a pack of cigarettes a day. He denies any chest pain, nausea, vomiting , diarrhea, abdominal pain, headache, dizziness, lightheadedness. No dysuria. Pt denies any travel history. He denies any pedal edema, syncope or orthopnea.   ED Course: low grade temp, hypoxic on RA, requiring up to 2 lit of [place] oxygen, labs reveal mild hyponatremia, mildly elevated liver function tests and platelets of 429. CXR shows  Abnormal density at the left lateral costophrenic angle worrisome for lingular or lower lobe pneumonia. Question other scattered areas of hazy infiltrate in the right, not definite.  Troponin is negative.  EKG shows sinus rhythm without any ischemic changes.   Review of Systems: As per HPI otherwise 10 point review of systems negative.    History reviewed. No pertinent past medical history.  History reviewed. No pertinent surgical history.   reports that he has been smoking cigarettes. He has been smoking about 0.30 packs per day. He does not have any smokeless tobacco history on file. No history on file for alcohol and drug.  No Known Allergies  Family history is positive for STROKE.    Prior to Admission medications   Medication Sig Start Date End Date Taking? Authorizing Provider  polyvinyl alcohol (LIQUIFILM TEARS) 1.4 % ophthalmic solution Place 1 drop into both eyes as needed for dry eyes (allergies).    Yes [provider]    Physical Exam: Vitals:   11/17/18 1045 11/17/18 1100 11/17/18 1140 11/17/18 1307  BP:  122/81  (!) 135/91  Pulse: 94 89 100 99  Resp: (!) 23 (!) 21 18 16   Temp:      TempSrc:      SpO2: (!) 86% (!) 86% 91% 92%    Constitutional: in mild distress form coughing.  Vitals:   11/17/18 1045 11/17/18 1100 11/17/18 1140 11/17/18 1307  BP:  122/81  (!) 135/91  Pulse: 94 89 100 99  Resp: (!) 23 (!) 21 18 16   Temp:      TempSrc:      SpO2: (!) 86% (!) 86% 91% 92%   Eyes: PERRL, lids and conjunctivae normal ENMT: Mucous membranes are moist. Posterior pharynx clear of any exudate or lesions.Normal dentition.  Neck: normal, supple, no masses, no thyromegaly Respiratory: tachypnea, scattered rales. No wheezing heard.  Cardiovascular: Regular rate and rhythm, no murmurs / rubs / gallops. No extremity edema. 2+ pedal pulses. No carotid bruits.  Abdomen: no tenderness, no masses palpated. No hepatosplenomegaly. Bowel sounds positive.  Musculoskeletal: no clubbing / cyanosis. No joint deformity upper and lower extremities. Good ROM, no contractures. Normal muscle tone.  Skin: no rashes, lesions, ulcers. No induration Neurologic: CN 2-12 grossly intact. Sensation intact, DTR normal. Strength 5/5 in all 4.  Psychiatric:  Alert and oriented x 3. Normal mood.     Labs on Admission: I have personally reviewed following labs and imaging studies  CBC: Recent Labs  Lab 11/17/18 0951  WBC 9.3  NEUTROABS 6.5  HGB 16.0  HCT 48.4  MCV 97.2  PLT 409*   Basic Metabolic Panel: Recent Labs  Lab 11/17/18 0951  NA 134*  K 3.7  CL 100  CO2 21*  GLUCOSE 132*  BUN 14  CREATININE 1.13  CALCIUM 8.8*   GFR: CrCl cannot be calculated (Unknown ideal weight.). Liver Function Tests: Recent Labs  Lab 11/17/18 0951  AST 64*  ALT 68*  ALKPHOS 82  BILITOT 1.5*  PROT 8.4*  ALBUMIN 3.7   No results for input(s): LIPASE, AMYLASE in the last 168 hours. No results  for input(s): AMMONIA in the last 168 hours. Coagulation Profile: No results for input(s): INR, PROTIME in the last 168 hours. Cardiac Enzymes: Recent Labs  Lab 11/17/18 0951  TROPONINI <0.03   BNP (last 3 results) No results for input(s): PROBNP in the last 8760 hours. HbA1C: No results for input(s): HGBA1C in the last 72 hours. CBG: No results for input(s): GLUCAP in the last 168 hours. Lipid Profile: No results for input(s): CHOL, HDL, LDLCALC, TRIG, CHOLHDL, LDLDIRECT in the last 72 hours. Thyroid Function Tests: No results for input(s): TSH, T4TOTAL, FREET4, T3FREE, THYROIDAB in the last 72 hours. Anemia Panel: No results for input(s): VITAMINB12, FOLATE, FERRITIN, TIBC, IRON, RETICCTPCT in the last 72 hours. Urine analysis: No results found for: COLORURINE, APPEARANCEUR, LABSPEC, [place], GLUCOSEU, HGBUR, BILIRUBINUR, KETONESUR, PROTEINUR, UROBILINOGEN, NITRITE, LEUKOCYTESUR  Radiological Exams on Admission: Dg Chest Port 1 View  Result Date: 11/17/2018 CLINICAL DATA:  Cough, shortness of breath and fever over the last 5 days. EXAM: PORTABLE CHEST 1 VIEW COMPARISON:  None FINDINGS: Heart size is normal. Mediastinal shadows are normal. There is abnormal density in the left lateral costophrenic angle which could be due to lingular or lower lobe pneumonia. Question areas of hazy patchy infiltrate on the right, not definite. IMPRESSION: Abnormal density at the left lateral costophrenic angle worrisome for lingular or lower lobe pneumonia. Question other scattered areas of hazy infiltrate in the right, not definite. Electronically Signed   By: Nelson Chimes M.D.   On: 11/17/2018 10:33    EKG: Independently reviewed. Sinus rhythm  Assessment/Plan Active Problems:   Suspected Covid-19 Virus Infection    Lingular pneumonia/ suspected Covid 19 virus infection.  Admit to rule out and start the patient on IV antibiotics, IV rocephin and zithromax.  [place] oxygen to keep sats greater  than 90% Send urine for pneumococcal antigen.  Sputum culture if able.  proning for at least 16 hours a day if possible.  Get ferritin, d dimer, ldh, crp.   Mildly elevated liver enzymes:  Unclear etiology.  Repeat in am.  Pt denies any nausea, vomiting or abdominal pain.      DVT prophylaxis: lovenox.  Code Status: full code.  Family Communication: none at bedside,discussed the plan of care with the patient.  Disposition Plan: pending clinical improvement.  Consults called: none.  Admission status: med surg obs.   Hosie Poisson MD [facility] Pager [phone]   If 7PM-7AM, please contact night-coverage [email or web address] Password [email or web address]  11/17/2018, 1:14 PM

## 2018-11-17 NOTE — Progress Notes (Signed)
[place] [facility] called to clarify appropriate isolation needed for patient. Patient on 2L of O2, no nebs, and minimal coughing at times per RN. Per [place] does not include coughing as a need for Airborne isolation. Per recommendation will follow [place] isolation precaution recommendations and will place patient on droplet/contact isolation with facemask.

## 2018-11-17 NOTE — ED Notes (Signed)
Lab notified the need to add on labs

## 2018-11-18 ENCOUNTER — Encounter (HOSPITAL_COMMUNITY): Payer: Self-pay | Admitting: *Deleted

## 2018-11-18 DIAGNOSIS — R0602 Shortness of breath: Secondary | ICD-10-CM | POA: Diagnosis present

## 2018-11-18 DIAGNOSIS — J9601 Acute respiratory failure with hypoxia: Secondary | ICD-10-CM | POA: Diagnosis present

## 2018-11-18 DIAGNOSIS — R6510 Systemic inflammatory response syndrome (SIRS) of non-infectious origin without acute organ dysfunction: Secondary | ICD-10-CM | POA: Diagnosis present

## 2018-11-18 DIAGNOSIS — J9621 Acute and chronic respiratory failure with hypoxia: Secondary | ICD-10-CM | POA: Diagnosis present

## 2018-11-18 DIAGNOSIS — E876 Hypokalemia: Secondary | ICD-10-CM | POA: Diagnosis present

## 2018-11-18 DIAGNOSIS — J069 Acute upper respiratory infection, unspecified: Secondary | ICD-10-CM | POA: Diagnosis not present

## 2018-11-18 DIAGNOSIS — E871 Hypo-osmolality and hyponatremia: Secondary | ICD-10-CM | POA: Diagnosis present

## 2018-11-18 DIAGNOSIS — R6889 Other general symptoms and signs: Secondary | ICD-10-CM | POA: Diagnosis not present

## 2018-11-18 DIAGNOSIS — F17210 Nicotine dependence, cigarettes, uncomplicated: Secondary | ICD-10-CM | POA: Diagnosis present

## 2018-11-18 DIAGNOSIS — D6869 Other thrombophilia: Secondary | ICD-10-CM | POA: Diagnosis present

## 2018-11-18 DIAGNOSIS — J1289 Other viral pneumonia: Secondary | ICD-10-CM | POA: Diagnosis present

## 2018-11-18 LAB — STREP PNEUMONIAE URINARY ANTIGEN: Strep Pneumo Urinary Antigen: NEGATIVE

## 2018-11-18 LAB — COMPREHENSIVE METABOLIC PANEL
ALT: 63 U/L — ABNORMAL HIGH (ref 0–44)
AST: 50 U/L — ABNORMAL HIGH (ref 15–41)
Albumin: 3 g/dL — ABNORMAL LOW (ref 3.5–5.0)
Alkaline Phosphatase: 72 U/L (ref 38–126)
Anion gap: 10 (ref 5–15)
BUN: 12 mg/dL (ref 6–20)
CO2: 23 mmol/L (ref 22–32)
Calcium: 8.5 mg/dL — ABNORMAL LOW (ref 8.9–10.3)
Chloride: 103 mmol/L (ref 98–111)
Creatinine, Ser: 0.82 mg/dL (ref 0.61–1.24)
GFR calc Af Amer: >60 mL/min (ref >60)
GFR calc non Af Amer: >60 mL/min (ref >60)
Glucose, Bld: 120 mg/dL — ABNORMAL HIGH (ref 70–99)
Potassium: 3.4 mmol/L — ABNORMAL LOW (ref 3.5–5.1)
Sodium: 136 mmol/L (ref 135–145)
Total Bilirubin: 1.2 mg/dL (ref 0.3–1.2)
Total Protein: 7.1 g/dL (ref 6.5–8.1)

## 2018-11-18 LAB — CBC WITH DIFFERENTIAL/PLATELET
Abs Immature Granulocytes: 0.03 K/uL (ref 0.00–0.07)
Basophils Absolute: 0 K/uL (ref 0.0–0.1)
Basophils Relative: 0 %
Eosinophils Absolute: 0 K/uL (ref 0.0–0.5)
Eosinophils Relative: 0 %
HCT: 45.1 % (ref 39.0–52.0)
Hemoglobin: 14.7 g/dL (ref 13.0–17.0)
Immature Granulocytes: 0 %
Lymphocytes Relative: 17 %
Lymphs Abs: 1.2 K/uL (ref 0.7–4.0)
MCH: 32.2 pg (ref 26.0–34.0)
MCHC: 32.6 g/dL (ref 30.0–36.0)
MCV: 98.9 fL (ref 80.0–100.0)
Monocytes Absolute: 0.5 K/uL (ref 0.1–1.0)
Monocytes Relative: 8 %
Neutro Abs: 5.4 K/uL (ref 1.7–7.7)
Neutrophils Relative %: 75 %
Platelets: 384 K/uL (ref 150–400)
RBC: 4.56 MIL/uL (ref 4.22–5.81)
RDW: 12.5 % (ref 11.5–15.5)
WBC: 7.2 K/uL (ref 4.0–10.5)
nRBC: 0 % (ref 0.0–0.2)

## 2018-11-18 LAB — EXPECTORATED SPUTUM ASSESSMENT W GRAM STAIN, RFLX TO RESP C: Special Requests: NORMAL

## 2018-11-18 LAB — C-REACTIVE PROTEIN: CRP: 11.5 mg/dL — ABNORMAL HIGH (ref <1.0)

## 2018-11-18 LAB — INTERLEUKIN-6, PLASMA: Interleukin-6, Plasma: 56.2 pg/mL — ABNORMAL HIGH (ref 0.0–12.2)

## 2018-11-18 LAB — MAGNESIUM: Magnesium: 2 mg/dL (ref 1.7–2.4)

## 2018-11-18 LAB — CK: Total CK: 61 U/L (ref 49–397)

## 2018-11-18 MED ORDER — MAGNESIUM SULFATE IN D5W 1-5 GM/100ML-% IV SOLN
1.0000 g | Freq: Once | INTRAVENOUS | Status: AC
  Administered 2018-11-18: 20:00:00 1 g via INTRAVENOUS
  Filled 2018-11-18: qty 100

## 2018-11-18 MED ORDER — ALBUTEROL SULFATE HFA 108 (90 BASE) MCG/ACT IN AERS
2.0000 | INHALATION_SPRAY | RESPIRATORY_TRACT | Status: DC | PRN
  Administered 2018-11-25: 14:00:00 2 via RESPIRATORY_TRACT

## 2018-11-18 MED ORDER — ALBUTEROL SULFATE HFA 108 (90 BASE) MCG/ACT IN AERS
2.0000 | INHALATION_SPRAY | Freq: Three times a day (TID) | RESPIRATORY_TRACT | Status: DC
  Administered 2018-11-18 – 2018-11-25 (×23): 2 via RESPIRATORY_TRACT

## 2018-11-18 MED ORDER — POTASSIUM CHLORIDE CRYS ER 20 MEQ PO TBCR
30.0000 meq | EXTENDED_RELEASE_TABLET | ORAL | Status: AC
  Administered 2018-11-18 (×2): 30 meq via ORAL
  Filled 2018-11-18 (×2): qty 1

## 2018-11-18 MED ORDER — FUROSEMIDE 10 MG/ML IJ SOLN
60.0000 mg | Freq: Once | INTRAMUSCULAR | Status: AC
  Administered 2018-11-18: 60 mg via INTRAVENOUS
  Filled 2018-11-18: qty 6

## 2018-11-18 NOTE — Progress Notes (Signed)
PROGRESS NOTE                                                                                                                                                                                                             Patient Demographics:    Micheal Watson, is a 58 y.o. male, DOB - 05-13-1961, KVQ:[id number]  Admit date - 11/17/2018   Admitting Physician Hosie Poisson, MD  Outpatient Primary MD for the patient is Patient, No Pcp Per  LOS - 0   Chief Complaint  Patient presents with  . Cough  . Fever  . Shortness of Breath       Brief Narrative    58 y.o. male with no significant past medical history presents to ED with fever , chills, myalgias, sob at rest and on exertion and cough, no productive since 2 to 3 days. Pt reports 3 of his family members have the same symptoms, and was hypoxic, he is COVID-19 positive.   Subjective:    Micheal Watson today for some dyspnea, cough, nonproductive, denies any chest pain. .   Assessment  & Plan :    Active Problems:   Suspected Covid-19 Virus Infection  Hypoxic respiratory failure/pneumonia secondary to COVID-19 infection -Patient hypoxic on presentation, was 2 L nasal, yesterday, it was increased to 4 L nasal cannula, I have  discussed with the patient, and staff, he was encouraged to prone , out of bed to chair, and use incentive spirometry. -Continue with Plaquenil -Continue with IV Rocephin and azithromycin -Continue to follow inflammatory markers, will check D-dimers, CRP, ferritin tomorrow   Mildly elevated LFTs -No abdominal pain, mildly elevated, trending down.  Tobacco abuse -Was counseled, continue with nicotine patch  Hypokalemia -Repleted, recheck in a.m. patient was started on IV diuresis     Code Status : Full  Family Communication  : D/W patient  Disposition Plan  : home when stable  Barriers For Discharge : Remains significantly hypoxic, requiring 4 L  nasal cannula continue with  Consults  :  None  Procedures  : None  DVT Prophylaxis  :  [name] lovenox  Lab Results  Component Value Date   PLT 384 11/18/2018    Antibiotics  :    Anti-infectives (From admission, onward)   Start     Dose/Rate Route Frequency Ordered Stop  11/18/18 2200  hydroxychloroquine (PLAQUENIL) tablet 200 mg  Status:  Discontinued     200 mg Oral 2 times daily 11/17/18 1909 11/17/18 1914   11/18/18 2000  hydroxychloroquine (PLAQUENIL) tablet 200 mg     200 mg Oral 2 times daily 11/17/18 1914 11/22/18 1959   11/18/18 1400  azithromycin (ZITHROMAX) 500 mg in sodium chloride 0.9 % 250 mL IVPB     500 mg 250 mL/hr over 60 Minutes Intravenous Every 24 hours 11/17/18 1308     11/18/18 1400  cefTRIAXone (ROCEPHIN) 1 g in sodium chloride 0.9 % 100 mL IVPB     1 g 200 mL/hr over 30 Minutes Intravenous Every 24 hours 11/17/18 1308     11/17/18 2000  hydroxychloroquine (PLAQUENIL) tablet 400 mg     400 mg Oral 2 times daily 11/17/18 1914 11/18/18 1239   11/17/18 1915  hydroxychloroquine (PLAQUENIL) tablet 400 mg  Status:  Discontinued     400 mg Oral 2 times daily 11/17/18 1909 11/17/18 1914   11/17/18 1145  cefTRIAXone (ROCEPHIN) 1 g in sodium chloride 0.9 % 100 mL IVPB     1 g 200 mL/hr over 30 Minutes Intravenous  Once 11/17/18 1130 11/17/18 1300   11/17/18 1145  azithromycin (ZITHROMAX) 500 mg in sodium chloride 0.9 % 250 mL IVPB     500 mg 250 mL/hr over 60 Minutes Intravenous  Once 11/17/18 1130 11/17/18 1615        Objective:   Vitals:   11/17/18 1439 11/17/18 2102 11/18/18 0610 11/18/18 1243  BP: (!) 155/87 (!) 139/92 135/79 (!) 138/92  Pulse: 84 84 81 92  Resp: (!) 22 20 20  (!) 24  Temp: 98.8 F (37.1 C) 99 F (37.2 C) 99.5 F (37.5 C) 98.7 F (37.1 C)  TempSrc: Oral Oral  Oral  SpO2: 96% 98% 96% 93%  Weight: 92.4 kg     Height: 5\' 9"  (1.753 m)       Wt Readings from Last 3 Encounters:  11/17/18 92.4 kg     Intake/Output Summary (Last  24 hours) at 11/18/2018 1603 Last data filed at 11/18/2018 0229 Gross per 24 hour  Intake 600 ml  Output 450 ml  Net 150 ml     Physical Exam  Awake Alert, Oriented X 3, No new F.N deficits, Normal affect Symmetrical Chest wall movement, diminished air entry at the bases, no wheezing RRR,No Gallops,Rubs or new Murmurs, No Parasternal Heave +ve B.Sounds, Abd Soft, No tenderness,No rebound - guarding or rigidity. No Cyanosis, Clubbing or edema, No new Rash or bruise     Data Review:    CBC Recent Labs  Lab 11/17/18 0951 11/18/18 0535  WBC 9.3 7.2  HGB 16.0 14.7  HCT 48.4 45.1  PLT 429* 384  MCV 97.2 98.9  MCH 32.1 32.2  MCHC 33.1 32.6  RDW 12.3 12.5  LYMPHSABS 2.0 1.2  MONOABS 0.8 0.5  EOSABS 0.0 0.0  BASOSABS 0.0 0.0    Chemistries  Recent Labs  Lab 11/17/18 0951 11/18/18 0535  NA 134* 136  K 3.7 3.4*  CL 100 103  CO2 21* 23  GLUCOSE 132* 120*  BUN 14 12  CREATININE 1.13 0.82  CALCIUM 8.8* 8.5*  MG  --  2.0  AST 64* 50*  ALT 68* 63*  ALKPHOS 82 72  BILITOT 1.5* 1.2   ------------------------------------------------------------------------------------------------------------------ No results for input(s): CHOL, HDL, LDLCALC, TRIG, CHOLHDL, LDLDIRECT in the last 72 hours.  No results found for: HGBA1C ------------------------------------------------------------------------------------------------------------------ No  results for input(s): TSH, T4TOTAL, T3FREE, THYROIDAB in the last 72 hours.  Invalid input(s): FREET3 ------------------------------------------------------------------------------------------------------------------ Recent Labs    11/17/18 0951  FERRITIN 916*    Coagulation profile No results for input(s): INR, PROTIME in the last 168 hours.  Recent Labs    11/17/18 1413  DDIMER 0.83*    Cardiac Enzymes Recent Labs  Lab 11/17/18 0951  TROPONINI <0.03    ------------------------------------------------------------------------------------------------------------------ No results found for: BNP  Inpatient Medications  Scheduled Meds: . albuterol  2 puff Inhalation TID  . enoxaparin (LOVENOX) injection  40 mg Subcutaneous Q24H  . furosemide  60 mg Intravenous Once  . hydroxychloroquine  200 mg Oral BID  . nicotine  21 mg Transdermal Daily  . potassium chloride  30 mEq Oral Q4H   Continuous Infusions: . azithromycin    . cefTRIAXone (ROCEPHIN)  IV    . magnesium sulfate 1 - 4 g bolus IVPB     PRN Meds:.acetaminophen, albuterol, polyvinyl alcohol  Micro Results Recent Results (from the past 240 hour(s))  SARS Coronavirus 2 [facility] order, Performed in [place] hospital lab)     Status: Abnormal   Collection Time: 11/17/18 12:27 PM  Result Value Ref Range Status   SARS Coronavirus 2 POSITIVE (A) NEGATIVE Final    Comment: CRITICAL RESULT CALLED TO, READ BACK BY AND VERIFIED WITH: Simon Rhein RN 15:25 11/17/18 ([email or web address]) (NOTE) If result is NEGATIVE SARS-CoV-2 target nucleic acids are NOT DETECTED. The SARS-CoV-2 RNA is generally detectable in upper and lower  respiratory specimens during the acute phase of infection. The lowest  concentration of SARS-CoV-2 viral copies this assay can detect is 250  copies / mL. A negative result does not preclude SARS-CoV-2 infection  and should not be used as the sole basis for treatment or other  patient management decisions.  A negative result may occur with  improper specimen collection / handling, submission of specimen other  than nasopharyngeal swab, presence of viral mutation(s) within the  areas targeted by this assay, and inadequate number of viral copies  (<250 copies / mL). A negative result must be combined with clinical  observations, patient history, and epidemiological information. If result is POSITIVE SARS-CoV-2 target nucleic acids are DE TECTED. The SARS-CoV-2 RNA is  generally detectable in upper and lower  respiratory specimens during the acute phase of infection.  Positive  results are indicative of active infection with SARS-CoV-2.  Clinical  correlation with patient history and other diagnostic information is  necessary to determine patient infection status.  Positive results do  not rule out bacterial infection or co-infection with other viruses. If result is PRESUMPTIVE POSTIVE SARS-CoV-2 nucleic acids MAY BE PRESENT.   A presumptive positive result was obtained on the submitted specimen  and confirmed on repeat testing.  While 2019 novel coronavirus  (SARS-CoV-2) nucleic acids may be present in the submitted sample  additional confirmatory testing may be necessary for epidemiological  and / or clinical management purposes  to differentiate between  SARS-CoV-2 and other Sarbecovirus currently known to infect humans.  If clinically indicated additional testing with an alternate test  methodology (L [id number]) is advised. The SARS-CoV-2 RNA is generally  detectable in upper and lower respiratory specimens during the acute  phase of infection. The expected result is Negative. Fact Sheet for Patients:  StrictlyIdeas.no Fact Sheet for Healthcare Providers: BankingDealers.co.za This test is not yet approved or cleared by the [place] FDA and has been authorized for detection and/or diagnosis of SARS-CoV-2 by  FDA under an Emergency Use Authorization (EUA).  This EUA will remain in effect (meaning this test can be used) for the duration of the COVID-19 declaration under Section 564(b)(1) of the Act, 21 U.S.C. section 360bbb-3(b)(1), unless the authorization is terminated or revoked sooner. Performed at [facility], [place] [address], [place], [place] [address]   Culture, blood (Routine X 2) w Reflex to ID Panel     Status: None (Preliminary result)   Collection Time: 11/17/18  3:26 PM  Result  Value Ref Range Status   Specimen Description   Final    BLOOD LEFT ANTECUBITAL Performed at [facility] [address], [place], [place] [address]    Special Requests   Final    BOTTLES DRAWN AEROBIC AND ANAEROBIC Blood Culture adequate volume Performed at [facility] [address] [place], [place] [address]    Culture   Final    NO GROWTH < 24 HOURS Performed at [facility] [address] [place], [place] [address]    Report Status PENDING  Incomplete  Culture, blood (Routine X 2) w Reflex to ID Panel     Status: None (Preliminary result)   Collection Time: 11/17/18  3:27 PM  Result Value Ref Range Status   Specimen Description   Final    BLOOD LEFT HAND Performed at [facility] [address] [place], [place] [address]    Special Requests   Final    BOTTLES DRAWN AEROBIC AND ANAEROBIC Blood Culture adequate volume Performed at [facility] [address], [place], [place] [address]    Culture   Final    NO GROWTH < 24 HOURS Performed at [facility] [address], [place], [place] [address]    Report Status PENDING  Incomplete  Culture, sputum-assessment     Status: None   Collection Time: 11/18/18  3:00 AM  Result Value Ref Range Status   Specimen Description SPUTUM  Final   Special Requests Normal  Final   Sputum evaluation   Final    Sputum specimen not acceptable for testing.  Please recollect.   RBV RN Mechele Claude AT 7628 11/18/18 CRUICKSHANK A Performed at [facility], [place] [address], [place], [place] [address]    Report Status 11/18/2018 FINAL  Final    Radiology Reports Dg Chest Port 1 View  Result Date: 11/17/2018 CLINICAL DATA:  Cough, shortness of breath and fever over the last 5 days. EXAM: PORTABLE CHEST 1 VIEW COMPARISON:  None FINDINGS: Heart size is normal. Mediastinal shadows are normal. There is abnormal density in the left lateral  costophrenic angle which could be due to lingular or lower lobe pneumonia. Question areas of hazy patchy infiltrate on the right, not definite. IMPRESSION: Abnormal density at the left lateral costophrenic angle worrisome for lingular or lower lobe pneumonia. Question other scattered areas of hazy infiltrate in the right, not definite. Electronically Signed   By: Nelson Chimes M.D.   On: 11/17/2018 10:33     Phillips Climes M.D on 11/18/2018 at 4:03 PM  Between 7am to 7pm - Pager - [phone]  After 7pm go to [email or web address] - password [facility]  [facility] -  Office  [phone]

## 2018-11-18 NOTE — Progress Notes (Signed)
12 lead EKG showed NSR.  QTC was within Normal Limits.  After the Hydroxychloroquine was administered, the QTc was measured by CMT. The QTc was 470 ms. CMT was notified to check/document the QTc today at 12 noon and 2200 as a 2 hour follow up to the Hydroxychloroquine administration today.

## 2018-11-19 DIAGNOSIS — J9601 Acute respiratory failure with hypoxia: Secondary | ICD-10-CM

## 2018-11-19 LAB — COMPREHENSIVE METABOLIC PANEL
ALT: 72 U/L — ABNORMAL HIGH (ref 0–44)
AST: 57 U/L — ABNORMAL HIGH (ref 15–41)
Albumin: 2.9 g/dL — ABNORMAL LOW (ref 3.5–5.0)
Alkaline Phosphatase: 77 U/L (ref 38–126)
Anion gap: 11 (ref 5–15)
BUN: 11 mg/dL (ref 6–20)
CO2: 21 mmol/L — ABNORMAL LOW (ref 22–32)
Calcium: 8.4 mg/dL — ABNORMAL LOW (ref 8.9–10.3)
Chloride: 104 mmol/L (ref 98–111)
Creatinine, Ser: 0.95 mg/dL (ref 0.61–1.24)
GFR calc Af Amer: >60 mL/min (ref >60)
GFR calc non Af Amer: >60 mL/min (ref >60)
Glucose, Bld: 115 mg/dL — ABNORMAL HIGH (ref 70–99)
Potassium: 4 mmol/L (ref 3.5–5.1)
Sodium: 136 mmol/L (ref 135–145)
Total Bilirubin: 0.9 mg/dL (ref 0.3–1.2)
Total Protein: 7.3 g/dL (ref 6.5–8.1)

## 2018-11-19 LAB — C-REACTIVE PROTEIN: CRP: 11.4 mg/dL — ABNORMAL HIGH (ref <1.0)

## 2018-11-19 LAB — EXPECTORATED SPUTUM ASSESSMENT W GRAM STAIN, RFLX TO RESP C

## 2018-11-19 LAB — CBC
HCT: 46.3 % (ref 39.0–52.0)
Hemoglobin: 14.8 g/dL (ref 13.0–17.0)
MCH: 32 pg (ref 26.0–34.0)
MCHC: 32 g/dL (ref 30.0–36.0)
MCV: 100.2 fL — ABNORMAL HIGH (ref 80.0–100.0)
Platelets: 364 10*3/uL (ref 150–400)
RBC: 4.62 MIL/uL (ref 4.22–5.81)
RDW: 12.4 % (ref 11.5–15.5)
WBC: 7.4 10*3/uL (ref 4.0–10.5)
nRBC: 0 % (ref 0.0–0.2)

## 2018-11-19 LAB — CK: Total CK: 29 U/L — ABNORMAL LOW (ref 49–397)

## 2018-11-19 LAB — FERRITIN: Ferritin: 772 ng/mL — ABNORMAL HIGH (ref 24–336)

## 2018-11-19 LAB — MAGNESIUM: Magnesium: 2.3 mg/dL (ref 1.7–2.4)

## 2018-11-19 LAB — D-DIMER, QUANTITATIVE: D-Dimer, Quant: 1.43 ug/mL-FEU — ABNORMAL HIGH (ref 0.00–0.50)

## 2018-11-19 LAB — EXPECTORATED SPUTUM ASSESSMENT W REFEX TO RESP CULTURE

## 2018-11-19 LAB — LEGIONELLA PNEUMOPHILA SEROGP 1 UR AG: L. pneumophila Serogp 1 Ur Ag: NEGATIVE

## 2018-11-19 MED ORDER — FUROSEMIDE 10 MG/ML IJ SOLN: 40.0000 mg | Freq: Two times a day (BID) | INTRAMUSCULAR | Status: DC

## 2018-11-19 MED ORDER — FUROSEMIDE 10 MG/ML IJ SOLN
40.0000 mg | Freq: Two times a day (BID) | INTRAMUSCULAR | Status: DC
  Administered 2018-11-19 – 2018-11-21 (×5): 40 mg via INTRAVENOUS
  Filled 2018-11-19 (×5): qty 4

## 2018-11-19 NOTE — TOC Initial Note (Signed)
Transition of Care [facility]) - Initial/Assessment Note    Patient Details  Name: Micheal Watson MRN: [id number] Date of Birth: 1960/12/25  [facility] CM/SW Contact:    Purcell Mouton, RN Phone Number: 11/19/2018, 2:05 PM  Clinical Narrative:      Pt admitted with cough, fever and shortness of breath. COVID19 positive.               Expected Discharge Plan: Home/Self Care Barriers to Discharge: No Barriers Identified   Patient Goals and CMS Choice Patient states their goals for this hospitalization and ongoing recovery are:: To get better      Expected Discharge Plan and Services Expected Discharge Plan: Home/Self Care   Discharge Planning Services: CM Consult   Living arrangements for the past 2 months: Single Family Home Expected Discharge Date: (unknown)                        Prior Living Arrangements/Services Living arrangements for the past 2 months: Single Family Home Lives with:: Relatives Patient language and need for interpreter reviewed:: Yes(Spanish) Do you feel safe going back to the place where you live?: Yes               Activities of Daily Living Home Assistive Devices/Equipment: None ADL Screening (condition at time of admission) Patient's cognitive ability adequate to safely complete daily activities?: Yes Is the patient deaf or have difficulty hearing?: No Does the patient have difficulty seeing, even when wearing glasses/contacts?: No Does the patient have difficulty concentrating, remembering, or making decisions?: No Patient able to express need for assistance with ADLs?: Yes Does the patient have difficulty dressing or bathing?: No Independently performs ADLs?: Yes (appropriate for developmental age) Does the patient have difficulty walking or climbing stairs?: No Weakness of Legs: Both Weakness of Arms/Hands: Both  Permission Sought/Granted                  Emotional Assessment               Admission diagnosis:  Shortness of breath [R06.02] Cough [R05] Suspected Covid-19 Virus Infection [R68.89] Patient Active Problem List   Diagnosis Date Noted  . Suspected Covid-19 Virus Infection 11/17/2018   PCP:  Patient, No Pcp Per Pharmacy:   [id number] - [place], [place] [address] [place] [address] Phone: [phone] Fax: [phone]     Social Determinants of Health (SDOH) Interventions    Readmission Risk Interventions No flowsheet data found.

## 2018-11-19 NOTE — Progress Notes (Signed)
PROGRESS NOTE                                                                                                                                                                                                             Patient Demographics:    Micheal Watson, is a 58 y.o. male, DOB - [birth year], EUM:[id number]  Admit date - 11/17/2018   Admitting Physician Hosie Poisson, MD  Outpatient Primary MD for the patient is Patient, No Pcp Per  LOS - 1   Chief Complaint  Patient presents with  . Cough  . Fever  . Shortness of Breath       Brief Narrative    58 y.o. male with no significant past medical history presents to ED with fever , chills, myalgias, sob at rest and on exertion and cough, no productive since 2 to 3 days. Pt reports 3 of his family members have the same symptoms, and was hypoxic, he is COVID-19 positive.   Subjective:    Micheal Watson today reports some dyspnea, but report he is feeling better, denies fever, chill or chest pain .   Assessment  & Plan :    Active Problems:   Suspected Covid-19 Virus Infection  Hypoxic respiratory failure/pneumonia secondary to COVID-19 infection -Patient was on 4 L nasal cannula yesterday, he is on 2 L nasal cannula today , appears to be improving, decreased work of breathing today . -He was encouraged to continue proning, will instruct use incentive spirometry,. -Continue with IV diuresis, renal function are stable, . -Continue with Plaquenil, continue to monitor QTC closely especially he is on azithromycin as well, it is 440 today, keep magnesium > 2, and potassium >4. -Continue with IV Rocephin and azithromycin -Continue to follow inflammatory markers, including ferritin, CRP and D-dimers   Mildly elevated LFTs -No abdominal pain, mildly elevated, trending down.  Tobacco abuse -Was counseled, continue with nicotine patch  Hypokalemia -Repleted     Code Status : Full  Family Communication  : D/W patient  Disposition Plan  : home when stable  Barriers For Discharge : Remains hypoxic, requiring 2 L nasal cannula continue with  Consults  :  None  Procedures  : None  DVT Prophylaxis  :  [place] lovenox  Lab Results  Component Value Date   PLT 364 11/19/2018    Antibiotics  :  Anti-infectives (From admission, onward)   Start     Dose/Rate Route Frequency Ordered Stop   11/18/18 2200  hydroxychloroquine (PLAQUENIL) tablet 200 mg  Status:  Discontinued     200 mg Oral 2 times daily 11/17/18 1909 11/17/18 1914   11/18/18 2000  hydroxychloroquine (PLAQUENIL) tablet 200 mg     200 mg Oral 2 times daily 11/17/18 1914 11/22/18 1959   11/18/18 1400  azithromycin (ZITHROMAX) 500 mg in sodium chloride 0.9 % 250 mL IVPB     500 mg 250 mL/hr over 60 Minutes Intravenous Every 24 hours 11/17/18 1308     11/18/18 1400  cefTRIAXone (ROCEPHIN) 1 g in sodium chloride 0.9 % 100 mL IVPB     1 g 200 mL/hr over 30 Minutes Intravenous Every 24 hours 11/17/18 1308     11/17/18 2000  hydroxychloroquine (PLAQUENIL) tablet 400 mg     400 mg Oral 2 times daily 11/17/18 1914 11/18/18 1239   11/17/18 1915  hydroxychloroquine (PLAQUENIL) tablet 400 mg  Status:  Discontinued     400 mg Oral 2 times daily 11/17/18 1909 11/17/18 1914   11/17/18 1145  cefTRIAXone (ROCEPHIN) 1 g in sodium chloride 0.9 % 100 mL IVPB     1 g 200 mL/hr over 30 Minutes Intravenous  Once 11/17/18 1130 11/17/18 1300   11/17/18 1145  azithromycin (ZITHROMAX) 500 mg in sodium chloride 0.9 % 250 mL IVPB     500 mg 250 mL/hr over 60 Minutes Intravenous  Once 11/17/18 1130 11/17/18 1615        Objective:   Vitals:   11/18/18 1243 11/18/18 1955 11/19/18 0505 11/19/18 1028  BP: (!) 138/92 (!) 136/92 (!) 139/105 117/79  Pulse: 92 93 84 89  Resp: (!) 24 17 18 20   Temp: 98.7 F (37.1 C) (!) 101.2 F (38.4 C) (!) 100.9 F (38.3 C) 98.3 F (36.8 C)  TempSrc: Oral Oral  Oral  SpO2: 93% 92% 98% 94%   Weight:      Height:        Wt Readings from Last 3 Encounters:  11/17/18 92.4 kg     Intake/Output Summary (Last 24 hours) at 11/19/2018 1234 Last data filed at 11/18/2018 2027 Gross per 24 hour  Intake 240 ml  Output 840 ml  Net -600 ml     Physical Exam  Blood pressure 117/79, pulse 89, temperature 98.3 F (36.8 C), temperature source Oral, resp. rate 20, height 5\' 9"  (1.753 m), weight 92.4 kg, SpO2 94 %.  Awake Alert, Oriented X 3, No new F.N deficits, Normal affect Symmetrical Chest wall movement, scattered rails RRR,No Gallops,Rubs or new Murmurs, No Parasternal Heave +ve B.Sounds, Abd Soft, No tenderness, No rebound - guarding or rigidity. No Cyanosis, Clubbing or edema, No new Rash or bruise       Data Review:    CBC Recent Labs  Lab 11/17/18 0951 11/18/18 0535 11/19/18 0444  WBC 9.3 7.2 7.4  HGB 16.0 14.7 14.8  HCT 48.4 45.1 46.3  PLT 429* 384 364  MCV 97.2 98.9 100.2*  MCH 32.1 32.2 32.0  MCHC 33.1 32.6 32.0  RDW 12.3 12.5 12.4  LYMPHSABS 2.0 1.2  --   MONOABS 0.8 0.5  --   EOSABS 0.0 0.0  --   BASOSABS 0.0 0.0  --     Chemistries  Recent Labs  Lab 11/17/18 0951 11/18/18 0535 11/19/18 0444  NA 134* 136 136  K 3.7 3.4* 4.0  CL 100 103 104  CO2 21* 23 21*  GLUCOSE 132* 120* 115*  BUN 14 12 11   CREATININE 1.13 0.82 0.95  CALCIUM 8.8* 8.5* 8.4*  MG  --  2.0 2.3  AST 64* 50* 57*  ALT 68* 63* 72*  ALKPHOS 82 72 77  BILITOT 1.5* 1.2 0.9   ------------------------------------------------------------------------------------------------------------------ No results for input(s): CHOL, HDL, LDLCALC, TRIG, CHOLHDL, LDLDIRECT in the last 72 hours.  No results found for: HGBA1C ------------------------------------------------------------------------------------------------------------------ No results for input(s): TSH, T4TOTAL, T3FREE, THYROIDAB in the last 72 hours.  Invalid input(s): FREET3  ------------------------------------------------------------------------------------------------------------------ Recent Labs    11/17/18 0951 11/19/18 0444  FERRITIN 916* 772*    Coagulation profile No results for input(s): INR, PROTIME in the last 168 hours.  Recent Labs    11/17/18 1413 11/19/18 0444  DDIMER 0.83* 1.43*    Cardiac Enzymes Recent Labs  Lab 11/17/18 0951  TROPONINI <0.03   ------------------------------------------------------------------------------------------------------------------ No results found for: BNP  Inpatient Medications  Scheduled Meds: . albuterol  2 puff Inhalation TID  . enoxaparin (LOVENOX) injection  40 mg Subcutaneous Q24H  . hydroxychloroquine  200 mg Oral BID  . nicotine  21 mg Transdermal Daily   Continuous Infusions: . azithromycin 500 mg (11/18/18 1913)  . cefTRIAXone (ROCEPHIN)  IV 1 g (11/18/18 1743)   PRN Meds:.acetaminophen, albuterol, polyvinyl alcohol  Micro Results Recent Results (from the past 240 hour(s))  SARS Coronavirus 2 [facility] order, Performed in [place] hospital lab)     Status: Abnormal   Collection Time: 11/17/18 12:27 PM  Result Value Ref Range Status   SARS Coronavirus 2 POSITIVE (A) NEGATIVE Final    Comment: CRITICAL RESULT CALLED TO, READ BACK BY AND VERIFIED WITH: Simon Rhein RN 15:25 11/17/18 ([email or web address]) (NOTE) If result is NEGATIVE SARS-CoV-2 target nucleic acids are NOT DETECTED. The SARS-CoV-2 RNA is generally detectable in upper and lower  respiratory specimens during the acute phase of infection. The lowest  concentration of SARS-CoV-2 viral copies this assay can detect is 250  copies / mL. A negative result does not preclude SARS-CoV-2 infection  and should not be used as the sole basis for treatment or other  patient management decisions.  A negative result may occur with  improper specimen collection / handling, submission of specimen other  than nasopharyngeal swab, presence of  viral mutation(s) within the  areas targeted by this assay, and inadequate number of viral copies  (<250 copies / mL). A negative result must be combined with clinical  observations, patient history, and epidemiological information. If result is POSITIVE SARS-CoV-2 target nucleic acids are DE TECTED. The SARS-CoV-2 RNA is generally detectable in upper and lower  respiratory specimens during the acute phase of infection.  Positive  results are indicative of active infection with SARS-CoV-2.  Clinical  correlation with patient history and other diagnostic information is  necessary to determine patient infection status.  Positive results do  not rule out bacterial infection or co-infection with other viruses. If result is PRESUMPTIVE POSTIVE SARS-CoV-2 nucleic acids MAY BE PRESENT.   A presumptive positive result was obtained on the submitted specimen  and confirmed on repeat testing.  While 2019 novel coronavirus  (SARS-CoV-2) nucleic acids may be present in the submitted sample  additional confirmatory testing may be necessary for epidemiological  and / or clinical management purposes  to differentiate between  SARS-CoV-2 and other Sarbecovirus currently known to infect humans.  If clinically indicated additional testing with an alternate test  methodology (L [id number]) is advised. The SARS-CoV-2 RNA  is generally  detectable in upper and lower respiratory specimens during the acute  phase of infection. The expected result is Negative. Fact Sheet for Patients:  StrictlyIdeas.no Fact Sheet for Healthcare Providers: BankingDealers.co.za This test is not yet approved or cleared by the [place] FDA and has been authorized for detection and/or diagnosis of SARS-CoV-2 by FDA under an Emergency Use Authorization (EUA).  This EUA will remain in effect (meaning this test can be used) for the duration of the COVID-19 declaration under Section  564(b)(1) of the Act, 21 U.S.C. section 360bbb-3(b)(1), unless the authorization is terminated or revoked sooner. Performed at [facility], [place] [address] [place], [place] [address]   Culture, blood (Routine X 2) w Reflex to ID Panel     Status: None (Preliminary result)   Collection Time: 11/17/18  3:26 PM  Result Value Ref Range Status   Specimen Description   Final    BLOOD LEFT ANTECUBITAL Performed at [facility] [address], [place], [place] [address]    Special Requests   Final    BOTTLES DRAWN AEROBIC AND ANAEROBIC Blood Culture adequate volume Performed at [facility] [address], [place], [place] [address]    Culture   Final    NO GROWTH 2 DAYS Performed at [facility] [address], [place], [place] [address]    Report Status PENDING  Incomplete  Culture, blood (Routine X 2) w Reflex to ID Panel     Status: None (Preliminary result)   Collection Time: 11/17/18  3:27 PM  Result Value Ref Range Status   Specimen Description   Final    BLOOD LEFT HAND Performed at [facility] [address], [place], [place] [address]    Special Requests   Final    BOTTLES DRAWN AEROBIC AND ANAEROBIC Blood Culture adequate volume Performed at [facility] [address] [place], [place] [address]    Culture   Final    NO GROWTH 2 DAYS Performed at [facility] [address], [place], [place] [address]    Report Status PENDING  Incomplete  Culture, sputum-assessment     Status: None   Collection Time: 11/18/18  3:00 AM  Result Value Ref Range Status   Specimen Description SPUTUM  Final   Special Requests Normal  Final   Sputum evaluation   Final    Sputum specimen not acceptable for testing.  Please recollect.   RBV RN Mechele Claude AT 6759 11/18/18 CRUICKSHANK A Performed at [facility], [place] [address], [place], [place] [address]    Report Status  11/18/2018 FINAL  Final    Radiology Reports Dg Chest Port 1 View  Result Date: 11/17/2018 CLINICAL DATA:  Cough, shortness of breath and fever over the last 5 days. EXAM: PORTABLE CHEST 1 VIEW COMPARISON:  None FINDINGS: Heart size is normal. Mediastinal shadows are normal. There is abnormal density in the left lateral costophrenic angle which could be due to lingular or lower lobe pneumonia. Question areas of hazy patchy infiltrate on the right, not definite. IMPRESSION: Abnormal density at the left lateral costophrenic angle worrisome for lingular or lower lobe pneumonia. Question other scattered areas of hazy infiltrate in the right, not definite. Electronically Signed   By: Nelson Chimes M.D.   On: 11/17/2018 10:33     Phillips Climes M.D on 11/19/2018 at 12:34 PM  Between 7am to 7pm - Pager - [phone]  After 7pm go to [email or web address] - password [facility]  [facility] -  Office  [phone]

## 2018-11-19 NOTE — Plan of Care (Signed)
  Problem: Health Behavior/Discharge Planning: Goal: Ability to manage health-related needs will improve Outcome: Progressing   Problem: Clinical Measurements: Goal: Ability to maintain clinical measurements within normal limits will improve Outcome: Progressing Goal: Will remain free from infection Outcome: Progressing Goal: Diagnostic test results will improve Outcome: Progressing Goal: Respiratory complications will improve Outcome: Progressing Goal: Cardiovascular complication will be avoided Outcome: Progressing   Problem: Nutrition: Goal: Adequate nutrition will be maintained Outcome: Progressing   Problem: Coping: Goal: Level of anxiety will decrease Outcome: Progressing   Problem: Elimination: Goal: Will not experience complications related to bowel motility Outcome: Progressing Goal: Will not experience complications related to urinary retention Outcome: Progressing   Problem: Safety: Goal: Ability to remain free from injury will improve Outcome: Progressing   Problem: Skin Integrity: Goal: Risk for impaired skin integrity will decrease Outcome: Progressing   

## 2018-11-20 LAB — CBC
HCT: 47.7 % (ref 39.0–52.0)
Hemoglobin: 15.4 g/dL (ref 13.0–17.0)
MCH: 32.4 pg (ref 26.0–34.0)
MCHC: 32.3 g/dL (ref 30.0–36.0)
MCV: 100.4 fL — ABNORMAL HIGH (ref 80.0–100.0)
Platelets: 374 10*3/uL (ref 150–400)
RBC: 4.75 MIL/uL (ref 4.22–5.81)
RDW: 12.4 % (ref 11.5–15.5)
WBC: 8.6 10*3/uL (ref 4.0–10.5)
nRBC: 0 % (ref 0.0–0.2)

## 2018-11-20 LAB — COMPREHENSIVE METABOLIC PANEL
ALT: 76 U/L — ABNORMAL HIGH (ref 0–44)
AST: 54 U/L — ABNORMAL HIGH (ref 15–41)
Albumin: 3 g/dL — ABNORMAL LOW (ref 3.5–5.0)
Alkaline Phosphatase: 74 U/L (ref 38–126)
Anion gap: 12 (ref 5–15)
BUN: 12 mg/dL (ref 6–20)
CO2: 21 mmol/L — ABNORMAL LOW (ref 22–32)
Calcium: 8.6 mg/dL — ABNORMAL LOW (ref 8.9–10.3)
Chloride: 103 mmol/L (ref 98–111)
Creatinine, Ser: 0.87 mg/dL (ref 0.61–1.24)
GFR calc Af Amer: >60 mL/min (ref >60)
GFR calc non Af Amer: >60 mL/min (ref >60)
Glucose, Bld: 110 mg/dL — ABNORMAL HIGH (ref 70–99)
Potassium: 3.8 mmol/L (ref 3.5–5.1)
Sodium: 136 mmol/L (ref 135–145)
Total Bilirubin: 1.1 mg/dL (ref 0.3–1.2)
Total Protein: 7.3 g/dL (ref 6.5–8.1)

## 2018-11-20 LAB — C-REACTIVE PROTEIN: CRP: 13.4 mg/dL — ABNORMAL HIGH (ref <1.0)

## 2018-11-20 LAB — CK: Total CK: 29 U/L — ABNORMAL LOW (ref 49–397)

## 2018-11-20 LAB — FERRITIN: Ferritin: 928 ng/mL — ABNORMAL HIGH (ref 24–336)

## 2018-11-20 LAB — MAGNESIUM: Magnesium: 2.2 mg/dL (ref 1.7–2.4)

## 2018-11-20 LAB — D-DIMER, QUANTITATIVE: D-Dimer, Quant: 3.22 ug/mL-FEU — ABNORMAL HIGH (ref 0.00–0.50)

## 2018-11-20 NOTE — Progress Notes (Signed)
Unit leadership attempted to contact  point of contact friend about patients planned transfer to [facility] site today.No answer. Await call back from friend.r

## 2018-11-20 NOTE — Progress Notes (Signed)
Pt arrived from [facility] this afternoon at approximately 1730 this afternoon.  He is standby assist only to help with equipment to go to the bathroom. New PIV was started, due to infiltration and painful to site. Pt is stable and resting comfortable in bed watching TV

## 2018-11-20 NOTE — Progress Notes (Signed)
Patient to transfer to [facility]. Report called to receiving RN. Family notified.  

## 2018-11-20 NOTE — Progress Notes (Signed)
Attempted to update family, went to voice mail left message to return call.

## 2018-11-20 NOTE — Progress Notes (Signed)
PROGRESS NOTE                                                                                                                                                                                                             Micheal Watson Demographics:    Micheal Watson, is a 58 y.o. male, DOB - 1961-06-17, AQT:[id number]  Admit date - 11/17/2018   Admitting Physician Hosie Poisson, MD  Outpatient Primary MD for the Micheal Watson is Micheal Watson, No Pcp Per  LOS - 2   Chief Complaint  Micheal Watson presents with  . Cough  . Fever  . Shortness of Breath       Brief Narrative    58 y.o. male with no significant past medical history presents to ED with fever , chills, myalgias, sob at rest and on exertion and cough, no productive since 2 to 3 days. Pt reports 3 of his family members have the same symptoms, and was hypoxic, he is COVID-19 positive.   Subjective:    Micheal Watson today reports some dyspnea, but report he is feeling better, denies fever, chill or chest pain .   Assessment  & Plan :    Active Problems:   Suspected Covid-19 Virus Infection  Hypoxic respiratory failure/pneumonia secondary to COVID-19 infection -Micheal Watson weaned down to 2 L nasal cannula today from 4 L yesterday, he appears to be improving,. -Micheal Watson was instructed to continue with awake prone, incentive spirometry . -Continue with IV diuresis, will continue with Lasix 40 mg IV twice daily . -Since inflammatory markers trending up including ferritin, CRP, and D-dimers, but given improving respiratory status, so no indication for Actemra currently, but will continue to monitor closely, if he has worsening oxygen requirement then will need Actemra . -Continue with Plaquenil, azithromycin, monitor QTC closely, it is 480 today, keep magnesium more than 2, and potassium more than 4 . -Continue with IV Rocephin and azithromycin  -D Dimer is trending up, will continue to monitor, if level  exceeds 5, will need to increase his DVT prophylaxis to 0.5 mg/kgevery 12 hours   Mildly elevated LFTs -No abdominal pain, mildly elevated, trending down.  Tobacco abuse -Was counseled, continue with nicotine patch  Hypokalemia -Repleted     Code Status : Full  Family Communication  : D/W Micheal Watson  Disposition Plan  : home when stable  Barriers  For Discharge : Remains hypoxic, requiring 2 L nasal cannula , on IV diuresis  Consults  :  None  Procedures  : None  DVT Prophylaxis  :  [place] lovenox  Lab Results  Component Value Date   PLT 374 11/20/2018    Antibiotics  :    Anti-infectives (From admission, onward)   Start     Dose/Rate Route Frequency Ordered Stop   11/18/18 2200  hydroxychloroquine (PLAQUENIL) tablet 200 mg  Status:  Discontinued     200 mg Oral 2 times daily 11/17/18 1909 11/17/18 1914   11/18/18 2000  hydroxychloroquine (PLAQUENIL) tablet 200 mg     200 mg Oral 2 times daily 11/17/18 1914 11/22/18 1959   11/18/18 1400  azithromycin (ZITHROMAX) 500 mg in sodium chloride 0.9 % 250 mL IVPB     500 mg 250 mL/hr over 60 Minutes Intravenous Every 24 hours 11/17/18 1308     11/18/18 1400  cefTRIAXone (ROCEPHIN) 1 g in sodium chloride 0.9 % 100 mL IVPB     1 g 200 mL/hr over 30 Minutes Intravenous Every 24 hours 11/17/18 1308     11/17/18 2000  hydroxychloroquine (PLAQUENIL) tablet 400 mg     400 mg Oral 2 times daily 11/17/18 1914 11/18/18 1239   11/17/18 1915  hydroxychloroquine (PLAQUENIL) tablet 400 mg  Status:  Discontinued     400 mg Oral 2 times daily 11/17/18 1909 11/17/18 1914   11/17/18 1145  cefTRIAXone (ROCEPHIN) 1 g in sodium chloride 0.9 % 100 mL IVPB     1 g 200 mL/hr over 30 Minutes Intravenous  Once 11/17/18 1130 11/17/18 1300   11/17/18 1145  azithromycin (ZITHROMAX) 500 mg in sodium chloride 0.9 % 250 mL IVPB     500 mg 250 mL/hr over 60 Minutes Intravenous  Once 11/17/18 1130 11/17/18 1615        Objective:   Vitals:   11/19/18  2046 11/20/18 0000 11/20/18 0526 11/20/18 1048  BP: (!) 152/92  126/81 123/87  Pulse: 83  89 92  Resp: 20  20   Temp: (!) 101.6 F (38.7 C) 99.1 F (37.3 C) 100.3 F (37.9 C) 98.2 F (36.8 C)  TempSrc: Oral Oral Oral Oral  SpO2: 95%  94% 96%  Weight:      Height:        Wt Readings from Last 3 Encounters:  11/17/18 92.4 kg     Intake/Output Summary (Last 24 hours) at 11/20/2018 1301 Last data filed at 11/20/2018 0046 Gross per 24 hour  Intake 350 ml  Output 1300 ml  Net -950 ml     Physical Exam  Blood pressure 123/87, pulse 92, temperature 98.2 F (36.8 C), temperature source Oral, resp. rate 20, height 5\' 9"  (1.753 m), weight 92.4 kg, SpO2 96 %.  Awake Alert, Oriented X 3, No new F.N deficits, Normal affect Symmetrical Chest wall movement, Good air movement bilaterally, scattered bilateral rales RRR,No Gallops,Rubs or new Murmurs, No Parasternal Heave +ve B.Sounds, Abd Soft, No tenderness, No rebound - guarding or rigidity. No Cyanosis, Clubbing or edema, No new Rash or bruise        Data Review:    CBC Recent Labs  Lab 11/17/18 0951 11/18/18 0535 11/19/18 0444 11/20/18 0442  WBC 9.3 7.2 7.4 8.6  HGB 16.0 14.7 14.8 15.4  HCT 48.4 45.1 46.3 47.7  PLT 429* 384 364 374  MCV 97.2 98.9 100.2* 100.4*  MCH 32.1 32.2 32.0 32.4  MCHC 33.1 32.6 32.0  32.3  RDW 12.3 12.5 12.4 12.4  LYMPHSABS 2.0 1.2  --   --   MONOABS 0.8 0.5  --   --   EOSABS 0.0 0.0  --   --   BASOSABS 0.0 0.0  --   --     Chemistries  Recent Labs  Lab 11/17/18 0951 11/18/18 0535 11/19/18 0444 11/20/18 0442  NA 134* 136 136 136  K 3.7 3.4* 4.0 3.8  CL 100 103 104 103  CO2 21* 23 21* 21*  GLUCOSE 132* 120* 115* 110*  BUN 14 12 11 12   CREATININE 1.13 0.82 0.95 0.87  CALCIUM 8.8* 8.5* 8.4* 8.6*  MG  --  2.0 2.3 2.2  AST 64* 50* 57* 54*  ALT 68* 63* 72* 76*  ALKPHOS 82 72 77 74  BILITOT 1.5* 1.2 0.9 1.1    ------------------------------------------------------------------------------------------------------------------ No results for input(s): CHOL, HDL, LDLCALC, TRIG, CHOLHDL, LDLDIRECT in the last 72 hours.  No results found for: HGBA1C ------------------------------------------------------------------------------------------------------------------ No results for input(s): TSH, T4TOTAL, T3FREE, THYROIDAB in the last 72 hours.  Invalid input(s): FREET3 ------------------------------------------------------------------------------------------------------------------ Recent Labs    11/19/18 0444 11/20/18 0442  FERRITIN 772* 928*    Coagulation profile No results for input(s): INR, PROTIME in the last 168 hours.  Recent Labs    11/19/18 0444 11/20/18 0442  DDIMER 1.43* 3.22*    Cardiac Enzymes Recent Labs  Lab 11/17/18 0951  TROPONINI <0.03   ------------------------------------------------------------------------------------------------------------------ No results found for: BNP  Inpatient Medications  Scheduled Meds: . albuterol  2 puff Inhalation TID  . enoxaparin (LOVENOX) injection  40 mg Subcutaneous Q24H  . furosemide  40 mg Intravenous BID  . hydroxychloroquine  200 mg Oral BID  . nicotine  21 mg Transdermal Daily   Continuous Infusions: . azithromycin 500 mg (11/19/18 1634)  . cefTRIAXone (ROCEPHIN)  IV 1 g (11/19/18 1448)   PRN Meds:.acetaminophen, albuterol, polyvinyl alcohol  Micro Results Recent Results (from the past 240 hour(s))  SARS Coronavirus 2 [facility] order, Performed in [place] hospital lab)     Status: Abnormal   Collection Time: 11/17/18 12:27 PM  Result Value Ref Range Status   SARS Coronavirus 2 POSITIVE (A) NEGATIVE Final    Comment: CRITICAL RESULT CALLED TO, READ BACK BY AND VERIFIED WITH: Simon Rhein RN 15:25 11/17/18 ([email or web address]) (NOTE) If result is NEGATIVE SARS-CoV-2 target nucleic acids are NOT DETECTED. The SARS-CoV-2 RNA  is generally detectable in upper and lower  respiratory specimens during the acute phase of infection. The lowest  concentration of SARS-CoV-2 viral copies this assay can detect is 250  copies / mL. A negative result does not preclude SARS-CoV-2 infection  and should not be used as the sole basis for treatment or other  Micheal Watson management decisions.  A negative result may occur with  improper specimen collection / handling, submission of specimen other  than nasopharyngeal swab, presence of viral mutation(s) within the  areas targeted by this assay, and inadequate number of viral copies  (<250 copies / mL). A negative result must be combined with clinical  observations, Micheal Watson history, and epidemiological information. If result is POSITIVE SARS-CoV-2 target nucleic acids are DE TECTED. The SARS-CoV-2 RNA is generally detectable in upper and lower  respiratory specimens during the acute phase of infection.  Positive  results are indicative of active infection with SARS-CoV-2.  Clinical  correlation with Micheal Watson history and other diagnostic information is  necessary to determine Micheal Watson infection status.  Positive results do  not rule out  bacterial infection or co-infection with other viruses. If result is PRESUMPTIVE POSTIVE SARS-CoV-2 nucleic acids MAY BE PRESENT.   A presumptive positive result was obtained on the submitted specimen  and confirmed on repeat testing.  While 2019 novel coronavirus  (SARS-CoV-2) nucleic acids may be present in the submitted sample  additional confirmatory testing may be necessary for epidemiological  and / or clinical management purposes  to differentiate between  SARS-CoV-2 and other Sarbecovirus currently known to infect humans.  If clinically indicated additional testing with an alternate test  methodology (L [id number]) is advised. The SARS-CoV-2 RNA is generally  detectable in upper and lower respiratory specimens during the acute  phase of infection.  The expected result is Negative. Fact Sheet for Patients:  StrictlyIdeas.no Fact Sheet for Healthcare Providers: BankingDealers.co.za This test is not yet approved or cleared by the [place] FDA and has been authorized for detection and/or diagnosis of SARS-CoV-2 by FDA under an Emergency Use Authorization (EUA).  This EUA will remain in effect (meaning this test can be used) for the duration of the COVID-19 declaration under Section 564(b)(1) of the Act, 21 U.S.C. section 360bbb-3(b)(1), unless the authorization is terminated or revoked sooner. Performed at [facility], [place] [address], [place], [place] [address]   Culture, blood (Routine X 2) w Reflex to ID Panel     Status: None (Preliminary result)   Collection Time: 11/17/18  3:26 PM  Result Value Ref Range Status   Specimen Description   Final    BLOOD LEFT ANTECUBITAL Performed at [facility] [address] [place], [place] [address]    Special Requests   Final    BOTTLES DRAWN AEROBIC AND ANAEROBIC Blood Culture adequate volume Performed at [facility] [address] [place], [place] [address]    Culture   Final    NO GROWTH 3 DAYS Performed at [facility], [place] [address] [place], [place] [address]    Report Status PENDING  Incomplete  Culture, blood (Routine X 2) w Reflex to ID Panel     Status: None (Preliminary result)   Collection Time: 11/17/18  3:27 PM  Result Value Ref Range Status   Specimen Description   Final    BLOOD LEFT HAND Performed at [facility] [address] [place], [place] [address]    Special Requests   Final    BOTTLES DRAWN AEROBIC AND ANAEROBIC Blood Culture adequate volume Performed at [facility] [address] [place], [place] [address]    Culture   Final    NO GROWTH 3 DAYS Performed at [facility], [place] [address]  [place], [place] [address]    Report Status PENDING  Incomplete  Culture, sputum-assessment     Status: None   Collection Time: 11/18/18  3:00 AM  Result Value Ref Range Status   Specimen Description SPUTUM  Final   Special Requests Normal  Final   Sputum evaluation   Final    Sputum specimen not acceptable for testing.  Please recollect.   RBV RN Mechele Claude AT 8341 11/18/18 CRUICKSHANK A Performed at [facility], [place] [address], [place], [place] [address]    Report Status 11/18/2018 FINAL  Final  Expectorated sputum assessment w rflx to resp cult     Status: None   Collection Time: 11/18/18  8:53 PM  Result Value Ref Range Status   Specimen Description EXPECTORATED SPUTUM  Final  Special Requests NONE  Final   Sputum evaluation   Final    THIS SPECIMEN IS ACCEPTABLE FOR SPUTUM CULTURE Performed at [facility] [address], [place], [place] [address]    Report Status 11/19/2018 FINAL  Final  Culture, respiratory     Status: None (Preliminary result)   Collection Time: 11/18/18  8:53 PM  Result Value Ref Range Status   Specimen Description   Final    EXPECTORATED SPUTUM Performed at [facility] [address] [place], [place] [address]    Special Requests   Final    NONE Reflexed from [id number] Performed at [facility] [address] [place], [place] [address]    Gram Stain   Final    MODERATE WBC PRESENT, PREDOMINANTLY PMN MODERATE GRAM NEGATIVE RODS MODERATE GRAM POSITIVE COCCI    Culture   Final    TOO YOUNG TO READ Performed at [facility], [place] [address] [place], [place] [address]    Report Status PENDING  Incomplete    Radiology Reports Dg Chest Port 1 View  Result Date: 11/17/2018 CLINICAL DATA:  Cough, shortness of breath and fever over the last 5 days. EXAM: PORTABLE CHEST 1 VIEW COMPARISON:  None FINDINGS: Heart size is normal. Mediastinal shadows are normal. There is abnormal  density in the left lateral costophrenic angle which could be due to lingular or lower lobe pneumonia. Question areas of hazy patchy infiltrate on the right, not definite. IMPRESSION: Abnormal density at the left lateral costophrenic angle worrisome for lingular or lower lobe pneumonia. Question other scattered areas of hazy infiltrate in the right, not definite. Electronically Signed   By: Nelson Chimes M.D.   On: 11/17/2018 10:33     Phillips Climes M.D on 11/20/2018 at 1:01 PM  Between 7am to 7pm - Pager - [phone]  After 7pm go to [email or web address] - password [facility]  [facility] -  Office  [phone]

## 2018-11-21 DIAGNOSIS — J9621 Acute and chronic respiratory failure with hypoxia: Secondary | ICD-10-CM

## 2018-11-21 LAB — PROCALCITONIN: Procalcitonin: 1.27 ng/mL

## 2018-11-21 MED ORDER — FUROSEMIDE 10 MG/ML IJ SOLN
40.0000 mg | Freq: Two times a day (BID) | INTRAMUSCULAR | Status: AC
  Administered 2018-11-21 – 2018-11-22 (×2): 40 mg via INTRAVENOUS
  Filled 2018-11-21 (×2): qty 4

## 2018-11-21 NOTE — Progress Notes (Signed)
2000- Utilized interpreter services to assure patient understood medications and interventions for disease process. Also determined his contact person is his friend because his family is in [place]. Per patient, the friend is keeping his family updated.

## 2018-11-21 NOTE — Progress Notes (Addendum)
[facility] PROGRESS NOTE    Progress Note  Micheal Watson  PPJ:[id number] DOB: 04-21-1961 DOA: 11/17/2018 PCP: Patient, No Pcp Per     Brief Narrative:   Micheal Watson is an 58 y.o. male tobacco abuse about a pack per day comes to the ED with fever chills myalgia shortness of breath at rest and exertion and a productive cough that started 2 to 3 days prior to admission.  In the ED: Was found to have a low-grade temperature, hypoxia on room air requiring 2 L of oxygen mild hyponatremia mild elevation in LFTs and a platelet count of 429 with a chest x-ray showing a left lower lobe infiltrate  Assessment/Plan:   Acute respiratory failure due to Covid-19 Virus Infection: Patient started on nasal cannula 4 L, has been brought down to 2 L and appears to be stable. Prone patient at least 16 hours a day if possible with a minimum of 2 to 3 hours. COVID inflammatory markers are stable CRP 11.4>>13.4 Ferritin 772>>928 D-dimer 1.43>>3.22 Check pro calcitonin. Oxygen requirement has been improving so no need for a term as this point. Continue Plaquenil, azithromycin monitor QTC, keep magnesium greater than 2 potassium greater than 4. Started empirically on IV Rocephin and azithromycin. Continue IV diuresis, follow strict I's and O's Daily weights.  DVT prophylaxis: lovenox Family Communication:none Disposition Plan/Barrier to D/C: unable to determine Code Status:     Code Status Orders  (From admission, onward)         Start     Ordered   11/17/18 1413  Full code  Continuous     11/17/18 1412        Code Status History    This patient has a current code status but no historical code status.        IV Access:    Peripheral IV   Procedures and diagnostic studies:   No results found.   Medical Consultants:    None.  Anti-Infectives:   IV rocephin and azithro  Subjective:    Vi Cart he relates his breathing has not  worsened compared to yesterday.  Although he still feels mildly nauseated.  Objective:    Vitals:   11/20/18 2300 11/21/18 0537 11/21/18 0755 11/21/18 0811  BP:  126/82 118/82   Pulse:  89 92   Resp:  (!) 23 (!) 25   Temp: 100.2 F (37.9 C) 99.8 F (37.7 C)  99.7 F (37.6 C)  TempSrc: Oral Oral  Oral  SpO2:  92% 93%   Weight:      Height:       No intake or output data in the 24 hours ending 11/21/18 1031 Filed Weights   11/17/18 1439 11/20/18 1732  Weight: 92.4 kg 87.4 kg    Exam: General exam: In no acute distress Respiratory system: Good air movement and clear to auscultation Cardiovascular system: Regular rate and rhythm with positive S1-S2 no murmurs rubs gallops Gastrointestinal system: Positive bowel sounds soft nontender nondistended Central nervous system: Alert and oriented. No focal neurological deficits. Psychiatry: Judgement and insight appear normal. Mood & affect appropriate.     Data Reviewed:    Labs: Basic Metabolic Panel: Recent Labs  Lab 11/17/18 0951 11/18/18 0535 11/19/18 0444 11/20/18 0442  NA 134* 136 136 136  K 3.7 3.4* 4.0 3.8  CL 100 103 104 103  CO2 21* 23 21* 21*  GLUCOSE 132* 120* 115* 110*  BUN 14 12 11 12   CREATININE 1.13 0.82 0.95 0.87  CALCIUM 8.8* 8.5* 8.4* 8.6*  MG  --  2.0 2.3 2.2   GFR Estimated Creatinine Clearance: 101.3 mL/min (by C-G formula based on SCr of 0.87 mg/dL). Liver Function Tests: Recent Labs  Lab 11/17/18 0951 11/18/18 0535 11/19/18 0444 11/20/18 0442  AST 64* 50* 57* 54*  ALT 68* 63* 72* 76*  ALKPHOS 82 72 77 74  BILITOT 1.5* 1.2 0.9 1.1  PROT 8.4* 7.1 7.3 7.3  ALBUMIN 3.7 3.0* 2.9* 3.0*   No results for input(s): LIPASE, AMYLASE in the last 168 hours. No results for input(s): AMMONIA in the last 168 hours. Coagulation profile No results for input(s): INR, PROTIME in the last 168 hours.  CBC: Recent Labs  Lab 11/17/18 0951 11/18/18 0535 11/19/18 0444 11/20/18 0442  WBC 9.3 7.2  7.4 8.6  NEUTROABS 6.5 5.4  --   --   HGB 16.0 14.7 14.8 15.4  HCT 48.4 45.1 46.3 47.7  MCV 97.2 98.9 100.2* 100.4*  PLT 429* 384 364 374   Cardiac Enzymes: Recent Labs  Lab 11/17/18 0951 11/18/18 0535 11/19/18 0444 11/20/18 0442  CKTOTAL  --  61 29* 29*  TROPONINI <0.03  --   --   --    BNP (last 3 results) No results for input(s): PROBNP in the last 8760 hours. CBG: No results for input(s): GLUCAP in the last 168 hours. D-Dimer: Recent Labs    11/19/18 0444 11/20/18 0442  DDIMER 1.43* 3.22*   Hgb A1c: No results for input(s): HGBA1C in the last 72 hours. Lipid Profile: No results for input(s): CHOL, HDL, LDLCALC, TRIG, CHOLHDL, LDLDIRECT in the last 72 hours. Thyroid function studies: No results for input(s): TSH, T4TOTAL, T3FREE, THYROIDAB in the last 72 hours.  Invalid input(s): FREET3 Anemia work up: Recent Labs    11/19/18 0444 11/20/18 0442  FERRITIN 772* 928*   Sepsis Labs: Recent Labs  Lab 11/17/18 0951 11/18/18 0535 11/19/18 0444 11/20/18 0442  WBC 9.3 7.2 7.4 8.6   Microbiology Recent Results (from the past 240 hour(s))  SARS Coronavirus 2 [facility] order, Performed in [place] hospital lab)     Status: Abnormal   Collection Time: 11/17/18 12:27 PM  Result Value Ref Range Status   SARS Coronavirus 2 POSITIVE (A) NEGATIVE Final    Comment: CRITICAL RESULT CALLED TO, READ BACK BY AND VERIFIED WITH: Simon Rhein RN 15:25 11/17/18 ([email or web address]) (NOTE) If result is NEGATIVE SARS-CoV-2 target nucleic acids are NOT DETECTED. The SARS-CoV-2 RNA is generally detectable in upper and lower  respiratory specimens during the acute phase of infection. The lowest  concentration of SARS-CoV-2 viral copies this assay can detect is 250  copies / mL. A negative result does not preclude SARS-CoV-2 infection  and should not be used as the sole basis for treatment or other  patient management decisions.  A negative result may occur with  improper specimen  collection / handling, submission of specimen other  than nasopharyngeal swab, presence of viral mutation(s) within the  areas targeted by this assay, and inadequate number of viral copies  (<250 copies / mL). A negative result must be combined with clinical  observations, patient history, and epidemiological information. If result is POSITIVE SARS-CoV-2 target nucleic acids are DE TECTED. The SARS-CoV-2 RNA is generally detectable in upper and lower  respiratory specimens during the acute phase of infection.  Positive  results are indicative of active infection with SARS-CoV-2.  Clinical  correlation with patient history and other diagnostic information is  necessary to determine  patient infection status.  Positive results do  not rule out bacterial infection or co-infection with other viruses. If result is PRESUMPTIVE POSTIVE SARS-CoV-2 nucleic acids MAY BE PRESENT.   A presumptive positive result was obtained on the submitted specimen  and confirmed on repeat testing.  While 2019 novel coronavirus  (SARS-CoV-2) nucleic acids may be present in the submitted sample  additional confirmatory testing may be necessary for epidemiological  and / or clinical management purposes  to differentiate between  SARS-CoV-2 and other Sarbecovirus currently known to infect humans.  If clinically indicated additional testing with an alternate test  methodology (L [id number]) is advised. The SARS-CoV-2 RNA is generally  detectable in upper and lower respiratory specimens during the acute  phase of infection. The expected result is Negative. Fact Sheet for Patients:  StrictlyIdeas.no Fact Sheet for Healthcare Providers: BankingDealers.co.za This test is not yet approved or cleared by the [place] FDA and has been authorized for detection and/or diagnosis of SARS-CoV-2 by FDA under an Emergency Use Authorization (EUA).  This EUA will remain in effect  (meaning this test can be used) for the duration of the COVID-19 declaration under Section 564(b)(1) of the Act, 21 U.S.C. section 360bbb-3(b)(1), unless the authorization is terminated or revoked sooner. Performed at [facility], [place] [address] [place], [place] [address]   Culture, blood (Routine X 2) w Reflex to ID Panel     Status: None (Preliminary result)   Collection Time: 11/17/18  3:26 PM  Result Value Ref Range Status   Specimen Description   Final    BLOOD LEFT ANTECUBITAL Performed at [facility] [address], [place], [place] [address]    Special Requests   Final    BOTTLES DRAWN AEROBIC AND ANAEROBIC Blood Culture adequate volume Performed at [place] [address] [place], [place] [address]    Culture   Final    NO GROWTH 4 DAYS Performed at [facility], [place] [address] [place], [place] [address]    Report Status PENDING  Incomplete  Culture, blood (Routine X 2) w Reflex to ID Panel     Status: None (Preliminary result)   Collection Time: 11/17/18  3:27 PM  Result Value Ref Range Status   Specimen Description   Final    BLOOD LEFT HAND Performed at [facility] [address], [place], [place] [address]    Special Requests   Final    BOTTLES DRAWN AEROBIC AND ANAEROBIC Blood Culture adequate volume Performed at [facility] [address] [place], [place] [address]    Culture   Final    NO GROWTH 4 DAYS Performed at [facility], [place] [address] [place], [place] [address]    Report Status PENDING  Incomplete  Culture, sputum-assessment     Status: None   Collection Time: 11/18/18  3:00 AM  Result Value Ref Range Status   Specimen Description SPUTUM  Final   Special Requests Normal  Final   Sputum evaluation   Final    Sputum specimen not acceptable for testing.  Please recollect.   RBV RN Mechele Claude AT 7412 11/18/18 CRUICKSHANK A Performed at [facility], [place] [address], [place], [place] [address]    Report Status 11/18/2018 FINAL  Final  Expectorated sputum assessment w rflx to resp cult     Status: None   Collection Time: 11/18/18  8:53 PM  Result Value Ref  Range Status   Specimen Description EXPECTORATED SPUTUM  Final   Special Requests NONE  Final   Sputum evaluation   Final    THIS SPECIMEN IS ACCEPTABLE FOR SPUTUM CULTURE Performed at [facility], [place] [address], [place], [place] [address]    Report Status 11/19/2018 FINAL  Final  Culture, respiratory     Status: None (Preliminary result)   Collection Time: 11/18/18  8:53 PM  Result Value Ref Range Status   Specimen Description   Final    EXPECTORATED SPUTUM Performed at [facility] [address] [place], [place] [address]    Special Requests   Final    NONE Reflexed from [id number] Performed at [facility] [address], [place], [place] [address]    Gram Stain   Final    MODERATE WBC PRESENT, PREDOMINANTLY PMN MODERATE GRAM NEGATIVE RODS MODERATE GRAM POSITIVE COCCI    Culture   Final    TOO YOUNG TO READ Performed at [facility], [place] [address], [place], [place] [address]    Report Status PENDING  Incomplete     Medications:   . albuterol  2 puff Inhalation TID  . enoxaparin (LOVENOX) injection  40 mg Subcutaneous Q24H  . furosemide  40 mg Intravenous BID  . hydroxychloroquine  200 mg Oral BID  . nicotine  21 mg Transdermal Daily   Continuous Infusions: . azithromycin 500 mg (11/20/18 1350)  . cefTRIAXone (ROCEPHIN)  IV 1 g (11/20/18 1356)       LOS: 3 days   Charlynne Cousins  [facility]  11/21/2018, 10:31 AM

## 2018-11-22 LAB — COMPREHENSIVE METABOLIC PANEL
ALT: 87 U/L — ABNORMAL HIGH (ref 0–44)
AST: 74 U/L — ABNORMAL HIGH (ref 15–41)
Albumin: 3.1 g/dL — ABNORMAL LOW (ref 3.5–5.0)
Alkaline Phosphatase: 119 U/L (ref 38–126)
Anion gap: 14 (ref 5–15)
BUN: 21 mg/dL — ABNORMAL HIGH (ref 6–20)
CO2: 24 mmol/L (ref 22–32)
Calcium: 9 mg/dL (ref 8.9–10.3)
Chloride: 97 mmol/L — ABNORMAL LOW (ref 98–111)
Creatinine, Ser: 1.16 mg/dL (ref 0.61–1.24)
GFR calc Af Amer: >60 mL/min (ref >60)
GFR calc non Af Amer: >60 mL/min (ref >60)
Glucose, Bld: 103 mg/dL — ABNORMAL HIGH (ref 70–99)
Potassium: 3.8 mmol/L (ref 3.5–5.1)
Sodium: 135 mmol/L (ref 135–145)
Total Bilirubin: 1.6 mg/dL — ABNORMAL HIGH (ref 0.3–1.2)
Total Protein: 8.2 g/dL — ABNORMAL HIGH (ref 6.5–8.1)

## 2018-11-22 LAB — CBC
HCT: 45.9 % (ref 39.0–52.0)
Hemoglobin: 15 g/dL (ref 13.0–17.0)
MCH: 31.9 pg (ref 26.0–34.0)
MCHC: 32.7 g/dL (ref 30.0–36.0)
MCV: 97.7 fL (ref 80.0–100.0)
Platelets: 372 10*3/uL (ref 150–400)
RBC: 4.7 MIL/uL (ref 4.22–5.81)
RDW: 12.2 % (ref 11.5–15.5)
WBC: 7.9 10*3/uL (ref 4.0–10.5)
nRBC: 0 % (ref 0.0–0.2)

## 2018-11-22 LAB — C-REACTIVE PROTEIN: CRP: 14.5 mg/dL — ABNORMAL HIGH (ref <1.0)

## 2018-11-22 LAB — CULTURE, BLOOD (ROUTINE X 2)
Culture: NO GROWTH
Culture: NO GROWTH
Special Requests: ADEQUATE
Special Requests: ADEQUATE

## 2018-11-22 LAB — PROCALCITONIN: Procalcitonin: 1.5 ng/mL

## 2018-11-22 LAB — CULTURE, RESPIRATORY W GRAM STAIN: Culture: NORMAL

## 2018-11-22 LAB — CULTURE, RESPIRATORY

## 2018-11-22 LAB — CK: Total CK: 27 U/L — ABNORMAL LOW (ref 49–397)

## 2018-11-22 MED ORDER — POTASSIUM CHLORIDE CRYS ER 20 MEQ PO TBCR
40.0000 meq | EXTENDED_RELEASE_TABLET | Freq: Two times a day (BID) | ORAL | Status: AC
  Administered 2018-11-22 (×2): 40 meq via ORAL
  Filled 2018-11-22 (×2): qty 2

## 2018-11-22 MED ORDER — MAGNESIUM OXIDE 400 (241.3 MG) MG PO TABS
400.0000 mg | ORAL_TABLET | Freq: Two times a day (BID) | ORAL | Status: AC
  Administered 2018-11-22 (×2): 400 mg via ORAL
  Filled 2018-11-22 (×2): qty 1

## 2018-11-22 NOTE — Progress Notes (Addendum)
[facility] PROGRESS NOTE    Progress Note  Micheal Watson  KKX:[id number] DOB: 1961-06-23 DOA: 11/17/2018 PCP: Patient, No Pcp Per     Brief Narrative:   Tanush Drees is an 58 y.o. male tobacco abuse about a pack per day comes to the ED with fever chills myalgia shortness of breath at rest and exertion and a productive cough that started 2 to 3 days prior to admission.  In the ED: Was found to have a low-grade temperature, hypoxia on room air requiring 2 L of oxygen mild hyponatremia mild elevation in LFTs and a platelet count of 429 with a chest x-ray showing a left lower lobe infiltrate  Assessment/Plan:   Acute respiratory failure due to Covid-19 Virus Infection: Saturations remained greater than 93% on 2 L. Continue to prone patient at least 16 hours a day if possible with a minimum of 2 to 3 hours. COVID inflammatory markers: CRP 11.4>>13.4>14.5 Ferritin 772>>928 D-dimer 1.43>>3.22 Pro calcitonin is trending up, recheck tmrw a long with a CBC with Dif. Oxygen requirement has been improving so no need for Acterma at this point. Continue Plaquenil, azithromycin monitor QTC, keep magnesium greater than 2 potassium greater than 4. Cont.empirically on IV Rocephin and azithromycin for Possible CAP. Continue IV diuresis, follow strict I's and O's Daily weights.  DVT prophylaxis: lovenox Family Communication:none Disposition Plan/Barrier to D/C: unable to determine Code Status:     Code Status Orders  (From admission, onward)         Start     Ordered   11/17/18 1413  Full code  Continuous     11/17/18 1412        Code Status History    This patient has a current code status but no historical code status.        IV Access:    Peripheral IV   Procedures and diagnostic studies:   No results found.   Medical Consultants:    None.  Anti-Infectives:   IV rocephin and azithro  Subjective:    Ashaun Aleshire nausea has  resolved, he relates his shortness of breath is unchanged compared to yesterday.  Objective:    Vitals:   11/21/18 1554 11/21/18 2026 11/22/18 0349 11/22/18 0500  BP: 124/86 120/84 122/73   Pulse: (!) 104 93    Resp: (!) 29 (!) 27    Temp: 98.4 F (36.9 C) 99 F (37.2 C) (!) 101 F (38.3 C) 99.2 F (37.3 C)  TempSrc: Oral Oral Oral Oral  SpO2: 93% 94%    Weight:      Height:        Intake/Output Summary (Last 24 hours) at 11/22/2018 0739 Last data filed at 11/22/2018 0509 Gross per 24 hour  Intake 861.07 ml  Output 600 ml  Net 261.07 ml   Filed Weights   11/17/18 1439 11/20/18 1732  Weight: 92.4 kg 87.4 kg    Exam: General exam: No acute distress Respiratory system: Good air movement and clear to auscultation. Cardiovascular system: Negative JVD regular rate and rhythm Gastrointestinal system: Positive bowel sounds soft nontender nondistended Central nervous system: Wake alert and oriented x3 nonfocal Extremities: No lower extremity edema Skin: No rashes, lesions or ulcers Psychiatry: And insight appear normal mood and affect are appropriate.     Data Reviewed:    Labs: Basic Metabolic Panel: Recent Labs  Lab 11/17/18 0951 11/18/18 0535 11/19/18 0444 11/20/18 0442  NA 134* 136 136 136  K 3.7 3.4* 4.0 3.8  CL 100  103 104 103  CO2 21* 23 21* 21*  GLUCOSE 132* 120* 115* 110*  BUN 14 12 11 12   CREATININE 1.13 0.82 0.95 0.87  CALCIUM 8.8* 8.5* 8.4* 8.6*  MG  --  2.0 2.3 2.2   GFR Estimated Creatinine Clearance: 101.3 mL/min (by C-G formula based on SCr of 0.87 mg/dL). Liver Function Tests: Recent Labs  Lab 11/17/18 0951 11/18/18 0535 11/19/18 0444 11/20/18 0442  AST 64* 50* 57* 54*  ALT 68* 63* 72* 76*  ALKPHOS 82 72 77 74  BILITOT 1.5* 1.2 0.9 1.1  PROT 8.4* 7.1 7.3 7.3  ALBUMIN 3.7 3.0* 2.9* 3.0*   No results for input(s): LIPASE, AMYLASE in the last 168 hours. No results for input(s): AMMONIA in the last 168 hours. Coagulation  profile No results for input(s): INR, PROTIME in the last 168 hours.  CBC: Recent Labs  Lab 11/17/18 0951 11/18/18 0535 11/19/18 0444 11/20/18 0442  WBC 9.3 7.2 7.4 8.6  NEUTROABS 6.5 5.4  --   --   HGB 16.0 14.7 14.8 15.4  HCT 48.4 45.1 46.3 47.7  MCV 97.2 98.9 100.2* 100.4*  PLT 429* 384 364 374   Cardiac Enzymes: Recent Labs  Lab 11/17/18 0951 11/18/18 0535 11/19/18 0444 11/20/18 0442  CKTOTAL  --  61 29* 29*  TROPONINI <0.03  --   --   --    BNP (last 3 results) No results for input(s): PROBNP in the last 8760 hours. CBG: No results for input(s): GLUCAP in the last 168 hours. D-Dimer: Recent Labs    11/20/18 0442  DDIMER 3.22*   Hgb A1c: No results for input(s): HGBA1C in the last 72 hours. Lipid Profile: No results for input(s): CHOL, HDL, LDLCALC, TRIG, CHOLHDL, LDLDIRECT in the last 72 hours. Thyroid function studies: No results for input(s): TSH, T4TOTAL, T3FREE, THYROIDAB in the last 72 hours.  Invalid input(s): FREET3 Anemia work up: Recent Labs    11/20/18 [place] 928*   Sepsis Labs: Recent Labs  Lab 11/17/18 0951 11/18/18 0535 11/19/18 0444 11/20/18 0442 11/21/18 1400  PROCALCITON  --   --   --   --  1.27  WBC 9.3 7.2 7.4 8.6  --    Microbiology Recent Results (from the past 240 hour(s))  SARS Coronavirus 2 [facility] order, Performed in [place] hospital lab)     Status: Abnormal   Collection Time: 11/17/18 12:27 PM  Result Value Ref Range Status   SARS Coronavirus 2 POSITIVE (A) NEGATIVE Final    Comment: CRITICAL RESULT CALLED TO, READ BACK BY AND VERIFIED WITH: Simon Rhein RN 15:25 11/17/18 ([email or web address]) (NOTE) If result is NEGATIVE SARS-CoV-2 target nucleic acids are NOT DETECTED. The SARS-CoV-2 RNA is generally detectable in upper and lower  respiratory specimens during the acute phase of infection. The lowest  concentration of SARS-CoV-2 viral copies this assay can detect is 250  copies / mL. A negative result does not  preclude SARS-CoV-2 infection  and should not be used as the sole basis for treatment or other  patient management decisions.  A negative result may occur with  improper specimen collection / handling, submission of specimen other  than nasopharyngeal swab, presence of viral mutation(s) within the  areas targeted by this assay, and inadequate number of viral copies  (<250 copies / mL). A negative result must be combined with clinical  observations, patient history, and epidemiological information. If result is POSITIVE SARS-CoV-2 target nucleic acids are DE TECTED. The SARS-CoV-2 RNA is  generally detectable in upper and lower  respiratory specimens during the acute phase of infection.  Positive  results are indicative of active infection with SARS-CoV-2.  Clinical  correlation with patient history and other diagnostic information is  necessary to determine patient infection status.  Positive results do  not rule out bacterial infection or co-infection with other viruses. If result is PRESUMPTIVE POSTIVE SARS-CoV-2 nucleic acids MAY BE PRESENT.   A presumptive positive result was obtained on the submitted specimen  and confirmed on repeat testing.  While 2019 novel coronavirus  (SARS-CoV-2) nucleic acids may be present in the submitted sample  additional confirmatory testing may be necessary for epidemiological  and / or clinical management purposes  to differentiate between  SARS-CoV-2 and other Sarbecovirus currently known to infect humans.  If clinically indicated additional testing with an alternate test  methodology (L [id number]) is advised. The SARS-CoV-2 RNA is generally  detectable in upper and lower respiratory specimens during the acute  phase of infection. The expected result is Negative. Fact Sheet for Patients:  StrictlyIdeas.no Fact Sheet for Healthcare Providers: BankingDealers.co.za This test is not yet approved or cleared by  the [place] FDA and has been authorized for detection and/or diagnosis of SARS-CoV-2 by FDA under an Emergency Use Authorization (EUA).  This EUA will remain in effect (meaning this test can be used) for the duration of the COVID-19 declaration under Section 564(b)(1) of the Act, 21 U.S.C. section 360bbb-3(b)(1), unless the authorization is terminated or revoked sooner. Performed at [facility], [place] [address] [place], [place] [address]   Culture, blood (Routine X 2) w Reflex to ID Panel     Status: None (Preliminary result)   Collection Time: 11/17/18  3:26 PM  Result Value Ref Range Status   Specimen Description   Final    BLOOD LEFT ANTECUBITAL Performed at [facility] [address], [place], [place] [address]    Special Requests   Final    BOTTLES DRAWN AEROBIC AND ANAEROBIC Blood Culture adequate volume Performed at [facility] [address] [place], [place] [address]    Culture   Final    NO GROWTH 4 DAYS Performed at [facility], [place] [address] [place], [place] [address]    Report Status PENDING  Incomplete  Culture, blood (Routine X 2) w Reflex to ID Panel     Status: None (Preliminary result)   Collection Time: 11/17/18  3:27 PM  Result Value Ref Range Status   Specimen Description   Final    BLOOD LEFT HAND Performed at [facility] [address], [place], [place] [address]    Special Requests   Final    BOTTLES DRAWN AEROBIC AND ANAEROBIC Blood Culture adequate volume Performed at [facility] [address] [place], [place] [address]    Culture   Final    NO GROWTH 4 DAYS Performed at [facility], [place] [address], [place], [place] [address]    Report Status PENDING  Incomplete  Culture, sputum-assessment     Status: None   Collection Time: 11/18/18  3:00 AM  Result Value Ref Range Status   Specimen Description SPUTUM  Final   Special Requests  Normal  Final   Sputum evaluation   Final    Sputum specimen not acceptable for testing.  Please recollect.   RBV RN Mechele Claude AT 9924 11/18/18 CRUICKSHANK A Performed at [facility], [address] [place], [place] [address]    Report Status 11/18/2018 FINAL  Final  Expectorated sputum assessment w rflx to resp cult     Status: None   Collection Time: 11/18/18  8:53 PM  Result Value Ref Range Status   Specimen Description EXPECTORATED SPUTUM  Final   Special Requests NONE  Final   Sputum evaluation   Final    THIS SPECIMEN IS ACCEPTABLE FOR SPUTUM CULTURE Performed at [facility], [place] [address], [place], [place] [address]    Report Status 11/19/2018 FINAL  Final  Culture, respiratory     Status: None (Preliminary result)   Collection Time: 11/18/18  8:53 PM  Result Value Ref Range Status   Specimen Description   Final    EXPECTORATED SPUTUM Performed at [facility] [address] [place], [place] [address]    Special Requests   Final    NONE Reflexed from [id number] Performed at [facility] [address] [place], [place] [address]    Gram Stain   Final    MODERATE WBC PRESENT, PREDOMINANTLY PMN MODERATE GRAM NEGATIVE RODS MODERATE GRAM POSITIVE COCCI    Culture   Final    FEW Consistent with normal respiratory flora. Performed at [facility], [place] [address] [place], [place] [address]    Report Status PENDING  Incomplete     Medications:    albuterol  2 puff Inhalation TID   enoxaparin (LOVENOX) injection  40 mg Subcutaneous Q24H   furosemide  40 mg Intravenous BID   hydroxychloroquine  200 mg Oral BID   nicotine  21 mg Transdermal Daily   Continuous Infusions:  azithromycin 250 mL/hr at 11/21/18 1500   cefTRIAXone (ROCEPHIN)  IV 200 mL/hr at 11/21/18 1500       LOS: 4 days   Charlynne Cousins  [facility]  11/22/2018, 7:39 AM

## 2018-11-23 ENCOUNTER — Inpatient Hospital Stay (HOSPITAL_COMMUNITY): Payer: HRSA Program

## 2018-11-23 LAB — COMPREHENSIVE METABOLIC PANEL
ALT: 98 U/L — ABNORMAL HIGH (ref 0–44)
AST: 76 U/L — ABNORMAL HIGH (ref 15–41)
Albumin: 3.1 g/dL — ABNORMAL LOW (ref 3.5–5.0)
Alkaline Phosphatase: 119 U/L (ref 38–126)
Anion gap: 10 (ref 5–15)
BUN: 19 mg/dL (ref 6–20)
CO2: 25 mmol/L (ref 22–32)
Calcium: 9.1 mg/dL (ref 8.9–10.3)
Chloride: 101 mmol/L (ref 98–111)
Creatinine, Ser: 1.03 mg/dL (ref 0.61–1.24)
GFR calc Af Amer: >60 mL/min (ref >60)
GFR calc non Af Amer: >60 mL/min (ref >60)
Glucose, Bld: 103 mg/dL — ABNORMAL HIGH (ref 70–99)
Potassium: 4.8 mmol/L (ref 3.5–5.1)
Sodium: 136 mmol/L (ref 135–145)
Total Bilirubin: 1 mg/dL (ref 0.3–1.2)
Total Protein: 8.1 g/dL (ref 6.5–8.1)

## 2018-11-23 LAB — CBC WITH DIFFERENTIAL/PLATELET
Abs Immature Granulocytes: 0.05 10*3/uL (ref 0.00–0.07)
Basophils Absolute: 0 10*3/uL (ref 0.0–0.1)
Basophils Relative: 1 %
Eosinophils Absolute: 0 10*3/uL (ref 0.0–0.5)
Eosinophils Relative: 1 %
HCT: 47.2 % (ref 39.0–52.0)
Hemoglobin: 15.4 g/dL (ref 13.0–17.0)
Immature Granulocytes: 1 %
Lymphocytes Relative: 12 %
Lymphs Abs: 1 10*3/uL (ref 0.7–4.0)
MCH: 32.7 pg (ref 26.0–34.0)
MCHC: 32.6 g/dL (ref 30.0–36.0)
MCV: 100.2 fL — ABNORMAL HIGH (ref 80.0–100.0)
Monocytes Absolute: 0.6 10*3/uL (ref 0.1–1.0)
Monocytes Relative: 7 %
Neutro Abs: 6.4 10*3/uL (ref 1.7–7.7)
Neutrophils Relative %: 78 %
Platelets: 316 10*3/uL (ref 150–400)
RBC: 4.71 MIL/uL (ref 4.22–5.81)
RDW: 12.2 % (ref 11.5–15.5)
WBC: 8.1 10*3/uL (ref 4.0–10.5)
nRBC: 0 % (ref 0.0–0.2)

## 2018-11-23 LAB — PROCALCITONIN: Procalcitonin: 1.66 ng/mL

## 2018-11-23 LAB — MAGNESIUM: Magnesium: 2.3 mg/dL (ref 1.7–2.4)

## 2018-11-23 NOTE — Progress Notes (Signed)
[facility] PROGRESS NOTE    Progress Note  Micheal Watson  YSA:[id number] DOB: 19-Sep-1960 DOA: 11/17/2018 PCP: Patient, No Pcp Per     Brief Narrative:   Micheal Watson is an 58 y.o. male tobacco abuse about a pack per day comes to the ED with fever chills myalgia shortness of breath at rest and exertion and a productive cough that started 2 to 3 days prior to admission.  In the ED: Was found to have a low-grade temperature, hypoxia on room air requiring 2 L of oxygen mild hyponatremia mild elevation in LFTs and a platelet count of 429 with a chest x-ray showing a left lower lobe infiltrate  Assessment/Plan:   Acute respiratory failure due to Covid-19 Virus Infection: O2 saturations are remained greater than 94% on 2 L of oxygen, the patient continues to spike fevers. We will have to continue to prone patient for at least 16 hours a day, with a minimum of 2 to 3 hours at a time. COVID inflammatory markers: CRP 11.4>>13.4>14.5 Ferritin 772>>928 pending D-dimer 1.43>>3.22>pending His procalcitonin is trending up, he has no new or increasing home oxygen requirement, he spiked a fever yesterday. We will get a chest x-ray. Continue Plaquenil and azithro his QTC is preserved, keep potassium greater than 4, magnesium greater than 2. Cont.empirically on IV Rocephin and azithromycin for Possible CAP, he is on day 6 of IV antibiotics. Stop IV Lasix, continue to monitor strict I's and O's he has remained negative. His blood pressure stable.  He appears to be euvolemic on physical exam.  DVT prophylaxis: lovenox Family Communication:none Disposition Plan/Barrier to D/C: unable to determine Code Status:     Code Status Orders  (From admission, onward)         Start     Ordered   11/17/18 1413  Full code  Continuous     11/17/18 1412        Code Status History    This patient has a current code status but no historical code status.        IV Access:     Peripheral IV   Procedures and diagnostic studies:   No results found.   Medical Consultants:    None.  Anti-Infectives:   IV rocephin and azithro  Subjective:    Micheal Watson he relates no further nausea, relates his breathing is unchanged compared to yesterday.  Objective:    Vitals:   11/23/18 0000 11/23/18 0300 11/23/18 0352 11/23/18 0645  BP:   115/82   Pulse: 82 79  79  Resp: (!) 27 (!) 24  (!) 23  Temp:   (!) 100.5 F (38.1 C)   TempSrc:   Oral   SpO2: 94% 96% 95% 94%  Weight:      Height:        Intake/Output Summary (Last 24 hours) at 11/23/2018 1014 Last data filed at 11/23/2018 0901 Gross per 24 hour  Intake 240 ml  Output 800 ml  Net -560 ml   Filed Weights   11/17/18 1439 11/20/18 1732  Weight: 92.4 kg 87.4 kg    Exam: General exam: In no acute distress Respiratory system: Has good air movement and clear to auscultation. Cardiovascular system: Has a regular rate and rhythm with positive S1-S2 no JVD. Gastrointestinal system: Abdomen is nondistended, soft and nontender.  Central nervous system: Awake alert and oriented x3 nonfocal. Extremities: No lower extremity edema. Skin: No rashes or ulceration. Psychiatry: Judgment and insight appear normal, mood and affect are  appropriate.      Data Reviewed:    Labs: Basic Metabolic Panel: Recent Labs  Lab 11/18/18 0535 11/19/18 0444 11/20/18 0442 11/22/18 0620 11/23/18 0515  NA 136 136 136 135 136  K 3.4* 4.0 3.8 3.8 4.8  CL 103 104 103 97* 101  CO2 23 21* 21* 24 25  GLUCOSE 120* 115* 110* 103* 103*  BUN 12 11 12  21* 19  CREATININE 0.82 0.95 0.87 1.16 1.03  CALCIUM 8.5* 8.4* 8.6* 9.0 9.1  MG 2.0 2.3 2.2  --  2.3   GFR Estimated Creatinine Clearance: 85.6 mL/min (by C-G formula based on SCr of 1.03 mg/dL). Liver Function Tests: Recent Labs  Lab 11/18/18 0535 11/19/18 0444 11/20/18 0442 11/22/18 0620 11/23/18 0515  AST 50* 57* 54* 74* 76*  ALT 63* 72* 76* 87*  98*  ALKPHOS 72 77 74 119 119  BILITOT 1.2 0.9 1.1 1.6* 1.0  PROT 7.1 7.3 7.3 8.2* 8.1  ALBUMIN 3.0* 2.9* 3.0* 3.1* 3.1*   No results for input(s): LIPASE, AMYLASE in the last 168 hours. No results for input(s): AMMONIA in the last 168 hours. Coagulation profile No results for input(s): INR, PROTIME in the last 168 hours.  CBC: Recent Labs  Lab 11/17/18 0951 11/18/18 0535 11/19/18 0444 11/20/18 0442 11/22/18 0620 11/23/18 0515  WBC 9.3 7.2 7.4 8.6 7.9 8.1  NEUTROABS 6.5 5.4  --   --   --  6.4  HGB 16.0 14.7 14.8 15.4 15.0 15.4  HCT 48.4 45.1 46.3 47.7 45.9 47.2  MCV 97.2 98.9 100.2* 100.4* 97.7 100.2*  PLT 429* 384 364 374 372 316   Cardiac Enzymes: Recent Labs  Lab 11/17/18 0951 11/18/18 0535 11/19/18 0444 11/20/18 0442 11/22/18 0620  CKTOTAL  --  61 29* 29* 27*  TROPONINI <0.03  --   --   --   --    BNP (last 3 results) No results for input(s): PROBNP in the last 8760 hours. CBG: No results for input(s): GLUCAP in the last 168 hours. D-Dimer: No results for input(s): DDIMER in the last 72 hours. Hgb A1c: No results for input(s): HGBA1C in the last 72 hours. Lipid Profile: No results for input(s): CHOL, HDL, LDLCALC, TRIG, CHOLHDL, LDLDIRECT in the last 72 hours. Thyroid function studies: No results for input(s): TSH, T4TOTAL, T3FREE, THYROIDAB in the last 72 hours.  Invalid input(s): FREET3 Anemia work up: No results for input(s): VITAMINB12, FOLATE, FERRITIN, TIBC, IRON, RETICCTPCT in the last 72 hours. Sepsis Labs: Recent Labs  Lab 11/19/18 0444 11/20/18 0442 11/21/18 1400 11/22/18 0620 11/23/18 0515  PROCALCITON  --   --  1.27 1.50 1.66  WBC 7.4 8.6  --  7.9 8.1   Microbiology Recent Results (from the past 240 hour(s))  SARS Coronavirus 2 [facility] order, Performed in [place] hospital lab)     Status: Abnormal   Collection Time: 11/17/18 12:27 PM  Result Value Ref Range Status   SARS Coronavirus 2 POSITIVE (A) NEGATIVE Final    Comment:  CRITICAL RESULT CALLED TO, READ BACK BY AND VERIFIED WITH: Simon Rhein RN 15:25 11/17/18 ([email or web address]) (NOTE) If result is NEGATIVE SARS-CoV-2 target nucleic acids are NOT DETECTED. The SARS-CoV-2 RNA is generally detectable in upper and lower  respiratory specimens during the acute phase of infection. The lowest  concentration of SARS-CoV-2 viral copies this assay can detect is 250  copies / mL. A negative result does not preclude SARS-CoV-2 infection  and should not be used as the sole basis  for treatment or other  patient management decisions.  A negative result may occur with  improper specimen collection / handling, submission of specimen other  than nasopharyngeal swab, presence of viral mutation(s) within the  areas targeted by this assay, and inadequate number of viral copies  (<250 copies / mL). A negative result must be combined with clinical  observations, patient history, and epidemiological information. If result is POSITIVE SARS-CoV-2 target nucleic acids are DE TECTED. The SARS-CoV-2 RNA is generally detectable in upper and lower  respiratory specimens during the acute phase of infection.  Positive  results are indicative of active infection with SARS-CoV-2.  Clinical  correlation with patient history and other diagnostic information is  necessary to determine patient infection status.  Positive results do  not rule out bacterial infection or co-infection with other viruses. If result is PRESUMPTIVE POSTIVE SARS-CoV-2 nucleic acids MAY BE PRESENT.   A presumptive positive result was obtained on the submitted specimen  and confirmed on repeat testing.  While 2019 novel coronavirus  (SARS-CoV-2) nucleic acids may be present in the submitted sample  additional confirmatory testing may be necessary for epidemiological  and / or clinical management purposes  to differentiate between  SARS-CoV-2 and other Sarbecovirus currently known to infect humans.  If clinically indicated  additional testing with an alternate test  methodology (L [id number]) is advised. The SARS-CoV-2 RNA is generally  detectable in upper and lower respiratory specimens during the acute  phase of infection. The expected result is Negative. Fact Sheet for Patients:  StrictlyIdeas.no Fact Sheet for Healthcare Providers: BankingDealers.co.za This test is not yet approved or cleared by the [place] FDA and has been authorized for detection and/or diagnosis of SARS-CoV-2 by FDA under an Emergency Use Authorization (EUA).  This EUA will remain in effect (meaning this test can be used) for the duration of the COVID-19 declaration under Section 564(b)(1) of the Act, 21 U.S.C. section 360bbb-3(b)(1), unless the authorization is terminated or revoked sooner. Performed at [facility], [place] [address], [place], [place] [address]   Culture, blood (Routine X 2) w Reflex to ID Panel     Status: None   Collection Time: 11/17/18  3:26 PM  Result Value Ref Range Status   Specimen Description   Final    BLOOD LEFT ANTECUBITAL Performed at [facility] [address], [place], [place] [address]    Special Requests   Final    BOTTLES DRAWN AEROBIC AND ANAEROBIC Blood Culture adequate volume Performed at [facility] [address], [place], [place] [address]    Culture   Final    NO GROWTH 5 DAYS Performed at [facility], [place] [address], [place], [place] [address]    Report Status 11/22/2018 FINAL  Final  Culture, blood (Routine X 2) w Reflex to ID Panel     Status: None   Collection Time: 11/17/18  3:27 PM  Result Value Ref Range Status   Specimen Description   Final    BLOOD LEFT HAND Performed at [facility] [address] [place], [place] [address]    Special Requests   Final    BOTTLES DRAWN AEROBIC AND ANAEROBIC Blood Culture adequate volume Performed at [facility] [address], [place], [place] [address]    Culture   Final    NO GROWTH 5 DAYS Performed at [facility], [place] [address], [place], [place] [address]  Report Status 11/22/2018 FINAL  Final  Culture, sputum-assessment     Status: None   Collection Time: 11/18/18  3:00 AM  Result Value Ref Range Status   Specimen Description SPUTUM  Final   Special Requests Normal  Final   Sputum evaluation   Final    Sputum specimen not acceptable for testing.  Please recollect.   RBV RN Mechele Claude AT 9518 11/18/18 CRUICKSHANK A Performed at [facility], [place] [address], [place], [place] [address]    Report Status 11/18/2018 FINAL  Final  Expectorated sputum assessment w rflx to resp cult     Status: None   Collection Time: 11/18/18  8:53 PM  Result Value Ref Range Status   Specimen Description EXPECTORATED SPUTUM  Final   Special Requests NONE  Final   Sputum evaluation   Final    THIS SPECIMEN IS ACCEPTABLE FOR SPUTUM CULTURE Performed at [facility], [place] [address], [place], [place] [address]    Report Status 11/19/2018 FINAL  Final  Culture, respiratory     Status: None   Collection Time: 11/18/18  8:53 PM  Result Value Ref Range Status   Specimen Description   Final    EXPECTORATED SPUTUM Performed at [facility] [address], [place], [place] [address]    Special Requests   Final    NONE Reflexed from [id number] Performed at [facility] [address], [place], [place] [address]    Gram Stain   Final    MODERATE WBC PRESENT, PREDOMINANTLY PMN MODERATE GRAM NEGATIVE RODS MODERATE GRAM POSITIVE COCCI    Culture   Final    FEW Consistent with normal respiratory flora. Performed at [facility], [place] [address] [place], [place] [address]    Report Status 11/22/2018 FINAL  Final     Medications:   . albuterol  2 puff Inhalation TID  . enoxaparin (LOVENOX) injection  40  mg Subcutaneous Q24H  . nicotine  21 mg Transdermal Daily   Continuous Infusions: . azithromycin 500 mg (11/22/18 1416)  . cefTRIAXone (ROCEPHIN)  IV 1 g (11/22/18 1315)       LOS: 5 days   Charlynne Cousins  [facility]  11/23/2018, 10:14 AM

## 2018-11-23 NOTE — Progress Notes (Signed)
Patient able to wean down to 0.5 liter o2 today. Pt continues to lay prone throughout shift. Able to ambulatre independantly. Appetite very good. No complaints of pain. VSS

## 2018-11-24 LAB — D-DIMER, QUANTITATIVE: D-Dimer, Quant: 16.07 ug/mL-FEU — ABNORMAL HIGH (ref 0.00–0.50)

## 2018-11-24 LAB — COMPREHENSIVE METABOLIC PANEL
ALT: 87 U/L — ABNORMAL HIGH (ref 0–44)
AST: 61 U/L — ABNORMAL HIGH (ref 15–41)
Albumin: 2.8 g/dL — ABNORMAL LOW (ref 3.5–5.0)
Alkaline Phosphatase: 109 U/L (ref 38–126)
Anion gap: 10 (ref 5–15)
BUN: 15 mg/dL (ref 6–20)
CO2: 20 mmol/L — ABNORMAL LOW (ref 22–32)
Calcium: 8.7 mg/dL — ABNORMAL LOW (ref 8.9–10.3)
Chloride: 104 mmol/L (ref 98–111)
Creatinine, Ser: 0.84 mg/dL (ref 0.61–1.24)
GFR calc Af Amer: >60 mL/min (ref >60)
GFR calc non Af Amer: >60 mL/min (ref >60)
Glucose, Bld: 113 mg/dL — ABNORMAL HIGH (ref 70–99)
Potassium: 4.3 mmol/L (ref 3.5–5.1)
Sodium: 134 mmol/L — ABNORMAL LOW (ref 135–145)
Total Bilirubin: 0.7 mg/dL (ref 0.3–1.2)
Total Protein: 7.5 g/dL (ref 6.5–8.1)

## 2018-11-24 LAB — CBC WITH DIFFERENTIAL/PLATELET
Abs Immature Granulocytes: 0.04 10*3/uL (ref 0.00–0.07)
Basophils Absolute: 0 10*3/uL (ref 0.0–0.1)
Basophils Relative: 1 %
Eosinophils Absolute: 0.1 10*3/uL (ref 0.0–0.5)
Eosinophils Relative: 1 %
HCT: 43.3 % (ref 39.0–52.0)
Hemoglobin: 14.3 g/dL (ref 13.0–17.0)
Immature Granulocytes: 1 %
Lymphocytes Relative: 14 %
Lymphs Abs: 1 10*3/uL (ref 0.7–4.0)
MCH: 32.3 pg (ref 26.0–34.0)
MCHC: 33 g/dL (ref 30.0–36.0)
MCV: 97.7 fL (ref 80.0–100.0)
Monocytes Absolute: 0.7 10*3/uL (ref 0.1–1.0)
Monocytes Relative: 9 %
Neutro Abs: 5.8 10*3/uL (ref 1.7–7.7)
Neutrophils Relative %: 74 %
Platelets: 329 10*3/uL (ref 150–400)
RBC: 4.43 MIL/uL (ref 4.22–5.81)
RDW: 12 % (ref 11.5–15.5)
WBC: 7.7 10*3/uL (ref 4.0–10.5)
nRBC: 0 % (ref 0.0–0.2)

## 2018-11-24 LAB — FERRITIN: Ferritin: 948 ng/mL — ABNORMAL HIGH (ref 24–336)

## 2018-11-24 MED ORDER — FUROSEMIDE 10 MG/ML IJ SOLN
20.0000 mg | Freq: Two times a day (BID) | INTRAMUSCULAR | Status: AC
  Administered 2018-11-24 – 2018-11-25 (×2): 20 mg via INTRAVENOUS
  Filled 2018-11-24 (×2): qty 2

## 2018-11-24 MED ORDER — ENOXAPARIN SODIUM 40 MG/0.4ML ~~LOC~~ SOLN
40.0000 mg | Freq: Two times a day (BID) | SUBCUTANEOUS | Status: DC
  Administered 2018-11-24 – 2018-11-27 (×6): 40 mg via SUBCUTANEOUS
  Filled 2018-11-24 (×6): qty 0.4

## 2018-11-24 NOTE — Progress Notes (Addendum)
[facility] PROGRESS NOTE    Progress Note  Micheal Watson  PJA:[id number] DOB: Jun 11, 1961 DOA: 11/17/2018 PCP: Patient, No Pcp Per     Brief Narrative:   Micheal Watson is an 58 y.o. male tobacco abuse about a pack per day comes to the ED with fever chills myalgia shortness of breath at rest and exertion and a productive cough that started 2 to 3 days prior to admission.  In the ED: Was found to have a low-grade temperature, hypoxia on room air requiring 2 L of oxygen mild hyponatremia mild elevation in LFTs and a platelet count of 429 with a chest x-ray showing a left lower lobe infiltrate  Assessment/Plan:   Acute respiratory failure due to Covid-19 Virus Infection: Saturations are improved now  half liter of oxygen with saturations greater 95%. He spiked a temperature yesterday, he is empirically on Rocephin and azithromycin. symptoms started 4.12.2020. I have personally reviewed his chest x-ray from 11/23/2018: showed persistent unchanged reticulonodular pattern at the bases was intervention. COVID inflammatory markers: CRP 11.4>>13.4>14.5 for today is pending. Ferritin 772>928>>928>948 D-dimer 1.43>>3.22>16.0,  d-dimer is significantly will start intermediate dose weight base VTE prophylaxis q 12hrs Continue Plaquenil and azithro his QTc is preserved today is 455, keep potassium greater than 4, magnesium greater than 2. His blood pressure stable.   He appears comfortable laying in bed, he is even in good mood. He relates that his appetite has returned.  He also relates that he has been able to be in the prone position almost all the night and yesterday during the day.  DVT prophylaxis: lovenox Family Communication:none Disposition Plan/Barrier to D/C: unable to determine Code Status:     Code Status Orders  (From admission, onward)         Start     Ordered   11/17/18 1413  Full code  Continuous     11/17/18 1412        Code Status History    This patient has a current code status but no historical code status.        IV Access:    Peripheral IV   Procedures and diagnostic studies:   Dg Chest Port 1 View  Result Date: 11/23/2018 CLINICAL DATA:  58 year old male with fever EXAM: PORTABLE CHEST 1 VIEW COMPARISON:  None. FINDINGS: Cardiomediastinal silhouette within normal limits. Reticulonodular opacity at the periphery of the right lung and minimally at the left lung apex. No pleural effusion or pneumothorax. IMPRESSION: Reticulonodular pattern of opacity may indicate multifocal infection, including atypical pathogen. Electronically Signed   By: Corrie Mckusick D.O.   On: 11/23/2018 10:49     Medical Consultants:    None.  Anti-Infectives:   IV rocephin and azithro  Subjective:    Micheal Watson he relates his nausea is resolved, he relates his breathing is improved, he is in a good mood his appetite has returned.  Objective:    Vitals:   11/23/18 1331 11/23/18 1711 11/23/18 2102 11/24/18 0331  BP: 122/87 (!) 137/95 123/76 (!) 137/92  Pulse:   84 83  Resp:   (!) 26 (!) 24  Temp: 98.7 F (37.1 C) 98.2 F (36.8 C) 99.5 F (37.5 C) 99.2 F (37.3 C)  TempSrc:  Oral Oral Oral  SpO2:   95%   Weight:      Height:        Intake/Output Summary (Last 24 hours) at 11/24/2018 0724 Last data filed at 11/23/2018 2341 Gross per 24 hour  Intake 960  ml  Output 400 ml  Net 560 ml   Filed Weights   11/17/18 1439 11/20/18 1732  Weight: 92.4 kg 87.4 kg    Exam: General exam: Awake alert in no acute distress, no increased work of breathing Respiratory system: Good air movement and clear to auscultation. Cardiovascular system: Regular rate a positive S1-S2, he has no JVD. Gastrointestinal system: Positive bowel sounds soft nontender nondistended Central nervous system: Awake alert and oriented x3 nonfocal Extremities: No lower extremity edema Skin: No rashes or ulceration Psychiatry: Mood and affect are  appropriate, judgment and insight appear normal.  Data Reviewed:    Labs: Basic Metabolic Panel: Recent Labs  Lab 11/18/18 0535 11/19/18 0444 11/20/18 0442 11/22/18 0620 11/23/18 0515 11/24/18 0500  NA 136 136 136 135 136 134*  K 3.4* 4.0 3.8 3.8 4.8 4.3  CL 103 104 103 97* 101 104  CO2 23 21* 21* 24 25 20*  GLUCOSE 120* 115* 110* 103* 103* 113*  BUN 12 11 12  21* 19 15  CREATININE 0.82 0.95 0.87 1.16 1.03 0.84  CALCIUM 8.5* 8.4* 8.6* 9.0 9.1 8.7*  MG 2.0 2.3 2.2  --  2.3  --    GFR Estimated Creatinine Clearance: 104.9 mL/min (by C-G formula based on SCr of 0.84 mg/dL). Liver Function Tests: Recent Labs  Lab 11/19/18 0444 11/20/18 0442 11/22/18 0620 11/23/18 0515 11/24/18 0500  AST 57* 54* 74* 76* 61*  ALT 72* 76* 87* 98* 87*  ALKPHOS 77 74 119 119 109  BILITOT 0.9 1.1 1.6* 1.0 0.7  PROT 7.3 7.3 8.2* 8.1 7.5  ALBUMIN 2.9* 3.0* 3.1* 3.1* 2.8*   No results for input(s): LIPASE, AMYLASE in the last 168 hours. No results for input(s): AMMONIA in the last 168 hours. Coagulation profile No results for input(s): INR, PROTIME in the last 168 hours.  CBC: Recent Labs  Lab 11/17/18 0951 11/18/18 0535 11/19/18 0444 11/20/18 0442 11/22/18 0620 11/23/18 0515 11/24/18 0500  WBC 9.3 7.2 7.4 8.6 7.9 8.1 7.7  NEUTROABS 6.5 5.4  --   --   --  6.4 5.8  HGB 16.0 14.7 14.8 15.4 15.0 15.4 14.3  HCT 48.4 45.1 46.3 47.7 45.9 47.2 43.3  MCV 97.2 98.9 100.2* 100.4* 97.7 100.2* 97.7  PLT 429* 384 364 374 372 316 329   Cardiac Enzymes: Recent Labs  Lab 11/17/18 0951 11/18/18 0535 11/19/18 0444 11/20/18 0442 11/22/18 0620  CKTOTAL  --  61 29* 29* 27*  TROPONINI <0.03  --   --   --   --    BNP (last 3 results) No results for input(s): PROBNP in the last 8760 hours. CBG: No results for input(s): GLUCAP in the last 168 hours. D-Dimer: Recent Labs    11/24/18 0500  DDIMER 16.07*   Hgb A1c: No results for input(s): HGBA1C in the last 72 hours. Lipid Profile: No  results for input(s): CHOL, HDL, LDLCALC, TRIG, CHOLHDL, LDLDIRECT in the last 72 hours. Thyroid function studies: No results for input(s): TSH, T4TOTAL, T3FREE, THYROIDAB in the last 72 hours.  Invalid input(s): FREET3 Anemia work up: Recent Labs    11/24/18 0450  FERRITIN 948*   Sepsis Labs: Recent Labs  Lab 11/20/18 0442 11/21/18 1400 11/22/18 0620 11/23/18 0515 11/24/18 0500  PROCALCITON  --  1.27 1.50 1.66  --   WBC 8.6  --  7.9 8.1 7.7   Microbiology Recent Results (from the past 240 hour(s))  SARS Coronavirus 2 [facility] order, Performed in [facility] hospital lab)  Status: Abnormal   Collection Time: 11/17/18 12:27 PM  Result Value Ref Range Status   SARS Coronavirus 2 POSITIVE (A) NEGATIVE Final    Comment: CRITICAL RESULT CALLED TO, READ BACK BY AND VERIFIED WITH: Simon Rhein RN 15:25 11/17/18 ([email or web address]) (NOTE) If result is NEGATIVE SARS-CoV-2 target nucleic acids are NOT DETECTED. The SARS-CoV-2 RNA is generally detectable in upper and lower  respiratory specimens during the acute phase of infection. The lowest  concentration of SARS-CoV-2 viral copies this assay can detect is 250  copies / mL. A negative result does not preclude SARS-CoV-2 infection  and should not be used as the sole basis for treatment or other  patient management decisions.  A negative result may occur with  improper specimen collection / handling, submission of specimen other  than nasopharyngeal swab, presence of viral mutation(s) within the  areas targeted by this assay, and inadequate number of viral copies  (<250 copies / mL). A negative result must be combined with clinical  observations, patient history, and epidemiological information. If result is POSITIVE SARS-CoV-2 target nucleic acids are DE TECTED. The SARS-CoV-2 RNA is generally detectable in upper and lower  respiratory specimens during the acute phase of infection.  Positive  results are indicative of active infection  with SARS-CoV-2.  Clinical  correlation with patient history and other diagnostic information is  necessary to determine patient infection status.  Positive results do  not rule out bacterial infection or co-infection with other viruses. If result is PRESUMPTIVE POSTIVE SARS-CoV-2 nucleic acids MAY BE PRESENT.   A presumptive positive result was obtained on the submitted specimen  and confirmed on repeat testing.  While 2019 novel coronavirus  (SARS-CoV-2) nucleic acids may be present in the submitted sample  additional confirmatory testing may be necessary for epidemiological  and / or clinical management purposes  to differentiate between  SARS-CoV-2 and other Sarbecovirus currently known to infect humans.  If clinically indicated additional testing with an alternate test  methodology (L [id number]) is advised. The SARS-CoV-2 RNA is generally  detectable in upper and lower respiratory specimens during the acute  phase of infection. The expected result is Negative. Fact Sheet for Patients:  StrictlyIdeas.no Fact Sheet for Healthcare Providers: BankingDealers.co.za This test is not yet approved or cleared by the [place] FDA and has been authorized for detection and/or diagnosis of SARS-CoV-2 by FDA under an Emergency Use Authorization (EUA).  This EUA will remain in effect (meaning this test can be used) for the duration of the COVID-19 declaration under Section 564(b)(1) of the Act, 21 U.S.C. section 360bbb-3(b)(1), unless the authorization is terminated or revoked sooner. Performed at [facility], [place] [address], [place], [place] [address]   Culture, blood (Routine X 2) w Reflex to ID Panel     Status: None   Collection Time: 11/17/18  3:26 PM  Result Value Ref Range Status   Specimen Description   Final    BLOOD LEFT ANTECUBITAL Performed at [place] [address], [place], [place] [address]     Special Requests   Final    BOTTLES DRAWN AEROBIC AND ANAEROBIC Blood Culture adequate volume Performed at [place] [address] [place], [place] [address]    Culture   Final    NO GROWTH 5 DAYS Performed at [facility], [place] [address], [place], [place] [address]    Report Status 11/22/2018 FINAL  Final  Culture, blood (Routine X 2) w Reflex  to ID Panel     Status: None   Collection Time: 11/17/18  3:27 PM  Result Value Ref Range Status   Specimen Description   Final    BLOOD LEFT HAND Performed at [facility], [place] [address], [place], [place] [address]    Special Requests   Final    BOTTLES DRAWN AEROBIC AND ANAEROBIC Blood Culture adequate volume Performed at [place] [address] [place], [place] [address]    Culture   Final    NO GROWTH 5 DAYS Performed at [facility], [place] [address] [place], [place] [address]    Report Status 11/22/2018 FINAL  Final  Culture, sputum-assessment     Status: None   Collection Time: 11/18/18  3:00 AM  Result Value Ref Range Status   Specimen Description SPUTUM  Final   Special Requests Normal  Final   Sputum evaluation   Final    Sputum specimen not acceptable for testing.  Please recollect.   RBV RN Mechele Claude AT 4132 11/18/18 CRUICKSHANK A Performed at [facility], [place] [address], [place], [place] [address]    Report Status 11/18/2018 FINAL  Final  Expectorated sputum assessment w rflx to resp cult     Status: None   Collection Time: 11/18/18  8:53 PM  Result Value Ref Range Status   Specimen Description EXPECTORATED SPUTUM  Final   Special Requests NONE  Final   Sputum evaluation   Final    THIS SPECIMEN IS ACCEPTABLE FOR SPUTUM CULTURE Performed at [facility], [place] [address] [place], [place] [address]    Report Status 11/19/2018 FINAL  Final  Culture, respiratory     Status: None   Collection Time: 11/18/18   8:53 PM  Result Value Ref Range Status   Specimen Description   Final    EXPECTORATED SPUTUM Performed at [place] [address] [place], [place] [address]    Special Requests   Final    NONE Reflexed from [phone] Performed at [place] [address], [place], [place] [address]    Gram Stain   Final    MODERATE WBC PRESENT, PREDOMINANTLY PMN MODERATE GRAM NEGATIVE RODS MODERATE GRAM POSITIVE COCCI    Culture   Final    FEW Consistent with normal respiratory flora. Performed at [facility], [place] [address], [place], [place] [address]    Report Status 11/22/2018 FINAL  Final     Medications:   . albuterol  2 puff Inhalation TID  . enoxaparin (LOVENOX) injection  40 mg Subcutaneous Q24H  . nicotine  21 mg Transdermal Daily   Continuous Infusions: . azithromycin 500 mg (11/23/18 1739)  . cefTRIAXone (ROCEPHIN)  IV Stopped (11/23/18 1957)       LOS: 6 days   Charlynne Cousins  [facility]  11/24/2018, 7:24 AM

## 2018-11-24 NOTE — Progress Notes (Signed)
Patient positioned to a prone position for the night, NAD noted. Will continue to treat and monitor per MD order.

## 2018-11-25 DIAGNOSIS — R6510 Systemic inflammatory response syndrome (SIRS) of non-infectious origin without acute organ dysfunction: Secondary | ICD-10-CM

## 2018-11-25 DIAGNOSIS — J9621 Acute and chronic respiratory failure with hypoxia: Secondary | ICD-10-CM

## 2018-11-25 DIAGNOSIS — R6889 Other general symptoms and signs: Secondary | ICD-10-CM

## 2018-11-25 LAB — COMPREHENSIVE METABOLIC PANEL
ALT: 90 U/L — ABNORMAL HIGH (ref 0–44)
AST: 59 U/L — ABNORMAL HIGH (ref 15–41)
Albumin: 3.1 g/dL — ABNORMAL LOW (ref 3.5–5.0)
Alkaline Phosphatase: 112 U/L (ref 38–126)
Anion gap: 10 (ref 5–15)
BUN: 14 mg/dL (ref 6–20)
CO2: 22 mmol/L (ref 22–32)
Calcium: 8.9 mg/dL (ref 8.9–10.3)
Chloride: 103 mmol/L (ref 98–111)
Creatinine, Ser: 0.89 mg/dL (ref 0.61–1.24)
GFR calc Af Amer: >60 mL/min (ref >60)
GFR calc non Af Amer: >60 mL/min (ref >60)
Glucose, Bld: 114 mg/dL — ABNORMAL HIGH (ref 70–99)
Potassium: 4.2 mmol/L (ref 3.5–5.1)
Sodium: 135 mmol/L (ref 135–145)
Total Bilirubin: 0.7 mg/dL (ref 0.3–1.2)
Total Protein: 7.8 g/dL (ref 6.5–8.1)

## 2018-11-25 LAB — CBC WITH DIFFERENTIAL/PLATELET
Abs Immature Granulocytes: 0.06 10*3/uL (ref 0.00–0.07)
Basophils Absolute: 0.1 10*3/uL (ref 0.0–0.1)
Basophils Relative: 1 %
Eosinophils Absolute: 0 10*3/uL (ref 0.0–0.5)
Eosinophils Relative: 0 %
HCT: 44.4 % (ref 39.0–52.0)
Hemoglobin: 14.7 g/dL (ref 13.0–17.0)
Immature Granulocytes: 1 %
Lymphocytes Relative: 13 %
Lymphs Abs: 1.3 10*3/uL (ref 0.7–4.0)
MCH: 32.3 pg (ref 26.0–34.0)
MCHC: 33.1 g/dL (ref 30.0–36.0)
MCV: 97.6 fL (ref 80.0–100.0)
Monocytes Absolute: 0.6 10*3/uL (ref 0.1–1.0)
Monocytes Relative: 7 %
Neutro Abs: 7.4 10*3/uL (ref 1.7–7.7)
Neutrophils Relative %: 78 %
Platelets: 280 10*3/uL (ref 150–400)
RBC: 4.55 MIL/uL (ref 4.22–5.81)
RDW: 12.1 % (ref 11.5–15.5)
WBC: 9.5 10*3/uL (ref 4.0–10.5)
nRBC: 0 % (ref 0.0–0.2)

## 2018-11-25 LAB — D-DIMER, QUANTITATIVE: D-Dimer, Quant: 15.81 ug/mL-FEU — ABNORMAL HIGH (ref 0.00–0.50)

## 2018-11-25 LAB — MAGNESIUM: Magnesium: 2.1 mg/dL (ref 1.7–2.4)

## 2018-11-25 LAB — FERRITIN: Ferritin: 796 ng/mL — ABNORMAL HIGH (ref 24–336)

## 2018-11-25 LAB — C-REACTIVE PROTEIN: CRP: 7.2 mg/dL — ABNORMAL HIGH (ref <1.0)

## 2018-11-25 NOTE — Progress Notes (Signed)
[facility] PROGRESS NOTE    Progress Note  Micheal Watson  TDD:[id number] DOB: February 24, 1961 DOA: 11/17/2018 PCP: Patient, No Pcp Per     Brief Narrative:   Micheal Watson is an 58 y.o. male tobacco abuse about a pack per day comes to the ED with fever chills myalgia shortness of breath at rest and exertion and a productive cough that started 2 to 3 days prior to admission.  In the ED: Was found to have a low-grade temperature, hypoxia on room air requiring 2 L of oxygen mild hyponatremia mild elevation in LFTs and a platelet count of 429 with a chest x-ray showing a left lower lobe infiltrate  Assessment/Plan:   Acute respiratory failure due to Covid-19 Virus Infection/SIRS: Continues to require half a liter of oxygen to keep saturations above 95%. He has remained afebrile, he has completed 7-day course of IV Rocephin and azithromycin. symptoms started 4.12.2020. COVID inflammatory markers: CRP 11.4>>13.4>14.5>7.2 Ferritin 772>928>>928>948>796 D-dimer 1.43>>3.22>16.0>15.8, due to significant elevated d-dimer he was put on Lovenox intermediate dose prophylaxis every 12 hours. Completed his course of Plaquenil. His blood pressure stable.   He has been able to to be prone for more than 16 hours a day. Ambulating check oxygen saturations with ambulation. Appears to be euvolemic on physical exam fluid has been restricted to 1200 cc daily.   Elevated transaminitis: Likely due to infection. Check an acute hepatitis panel. He drinks alcohol occasionally.   DVT prophylaxis: lovenox Family Communication:none Disposition Plan/Barrier to D/C: unable to determine Code Status:     Code Status Orders  (From admission, onward)         Start     Ordered   11/17/18 1413  Full code  Continuous     11/17/18 1412        Code Status History    This patient has a current code status but no historical code status.        IV Access:    Peripheral IV    Procedures and diagnostic studies:   Dg Chest Port 1 View  Result Date: 11/23/2018 CLINICAL DATA:  58 year old male with fever EXAM: PORTABLE CHEST 1 VIEW COMPARISON:  None. FINDINGS: Cardiomediastinal silhouette within normal limits. Reticulonodular opacity at the periphery of the right lung and minimally at the left lung apex. No pleural effusion or pneumothorax. IMPRESSION: Reticulonodular pattern of opacity may indicate multifocal infection, including atypical pathogen. Electronically Signed   By: Corrie Mckusick D.O.   On: 11/23/2018 10:49     Medical Consultants:    None.  Anti-Infectives:   IV rocephin and azithro  Subjective:    Micheal Watson no further nausea, relates his breathing continues to improve, has had a good appetite.  Objective:    Vitals:   11/24/18 1556 11/24/18 1941 11/24/18 2309 11/25/18 0332  BP: (!) 135/93 (!) 129/93 125/78 112/71  Pulse: 87 91 89 88  Resp: 18 (!) 22 (!) 24 (!) 27  Temp: 99.5 F (37.5 C) 100.1 F (37.8 C) 99 F (37.2 C) 100 F (37.8 C)  TempSrc: Oral Oral Oral Oral  SpO2: 92% 95% 93% 91%  Weight:      Height:        Intake/Output Summary (Last 24 hours) at 11/25/2018 0731 Last data filed at 11/25/2018 0600 Gross per 24 hour  Intake 720 ml  Output 1575 ml  Net -855 ml   Filed Weights   11/17/18 1439 11/20/18 1732  Weight: 92.4 kg 87.4 kg    Exam:  General exam: In no acute distress Respiratory system: Air movement and clear to auscultation. Cardiovascular system: Rate and rhythm with positive S1-S2. Gastrointestinal system: Diminished nondistended, soft nontender positive bowel sounds. Central nervous system: Alert and oriented x3 no focal deficits. Extremities: No lower extremity edema. Skin: No rashes or ulceration.   Data Reviewed:    Labs: Basic Metabolic Panel: Recent Labs  Lab 11/19/18 0444 11/20/18 0442 11/22/18 0620 11/23/18 0515 11/24/18 0500 11/25/18 0500  NA 136 136 135 136 134* 135   K 4.0 3.8 3.8 4.8 4.3 4.2  CL 104 103 97* 101 104 103  CO2 21* 21* 24 25 20* 22  GLUCOSE 115* 110* 103* 103* 113* 114*  BUN 11 12 21* 19 15 14   CREATININE 0.95 0.87 1.16 1.03 0.84 0.89  CALCIUM 8.4* 8.6* 9.0 9.1 8.7* 8.9  MG 2.3 2.2  --  2.3  --   --    GFR Estimated Creatinine Clearance: 99 mL/min (by C-G formula based on SCr of 0.89 mg/dL). Liver Function Tests: Recent Labs  Lab 11/20/18 0442 11/22/18 0620 11/23/18 0515 11/24/18 0500 11/25/18 0500  AST 54* 74* 76* 61* 59*  ALT 76* 87* 98* 87* 90*  ALKPHOS 74 119 119 109 112  BILITOT 1.1 1.6* 1.0 0.7 0.7  PROT 7.3 8.2* 8.1 7.5 7.8  ALBUMIN 3.0* 3.1* 3.1* 2.8* 3.1*   No results for input(s): LIPASE, AMYLASE in the last 168 hours. No results for input(s): AMMONIA in the last 168 hours. Coagulation profile No results for input(s): INR, PROTIME in the last 168 hours.  CBC: Recent Labs  Lab 11/20/18 0442 11/22/18 0620 11/23/18 0515 11/24/18 0500 11/25/18 0500  WBC 8.6 7.9 8.1 7.7 9.5  NEUTROABS  --   --  6.4 5.8 7.4  HGB 15.4 15.0 15.4 14.3 14.7  HCT 47.7 45.9 47.2 43.3 44.4  MCV 100.4* 97.7 100.2* 97.7 97.6  PLT 374 372 316 329 280   Cardiac Enzymes: Recent Labs  Lab 11/19/18 0444 11/20/18 0442 11/22/18 0620  CKTOTAL 29* 29* 27*   BNP (last 3 results) No results for input(s): PROBNP in the last 8760 hours. CBG: No results for input(s): GLUCAP in the last 168 hours. D-Dimer: Recent Labs    11/24/18 0500 11/25/18 0500  DDIMER 16.07* 15.81*   Hgb A1c: No results for input(s): HGBA1C in the last 72 hours. Lipid Profile: No results for input(s): CHOL, HDL, LDLCALC, TRIG, CHOLHDL, LDLDIRECT in the last 72 hours. Thyroid function studies: No results for input(s): TSH, T4TOTAL, T3FREE, THYROIDAB in the last 72 hours.  Invalid input(s): FREET3 Anemia work up: Recent Labs    11/24/18 0450  FERRITIN 948*   Sepsis Labs: Recent Labs  Lab 11/21/18 1400 11/22/18 0620 11/23/18 0515 11/24/18 0500  11/25/18 0500  PROCALCITON 1.27 1.50 1.66  --   --   WBC  --  7.9 8.1 7.7 9.5   Microbiology Recent Results (from the past 240 hour(s))  SARS Coronavirus 2 [facility] order, Performed in [place] hospital lab)     Status: Abnormal   Collection Time: 11/17/18 12:27 PM  Result Value Ref Range Status   SARS Coronavirus 2 POSITIVE (A) NEGATIVE Final    Comment: CRITICAL RESULT CALLED TO, READ BACK BY AND VERIFIED WITH: Simon Rhein RN 15:25 11/17/18 ([email or web address]) (NOTE) If result is NEGATIVE SARS-CoV-2 target nucleic acids are NOT DETECTED. The SARS-CoV-2 RNA is generally detectable in upper and lower  respiratory specimens during the acute phase of infection. The lowest  concentration of  SARS-CoV-2 viral copies this assay can detect is 250  copies / mL. A negative result does not preclude SARS-CoV-2 infection  and should not be used as the sole basis for treatment or other  patient management decisions.  A negative result may occur with  improper specimen collection / handling, submission of specimen other  than nasopharyngeal swab, presence of viral mutation(s) within the  areas targeted by this assay, and inadequate number of viral copies  (<250 copies / mL). A negative result must be combined with clinical  observations, patient history, and epidemiological information. If result is POSITIVE SARS-CoV-2 target nucleic acids are DE TECTED. The SARS-CoV-2 RNA is generally detectable in upper and lower  respiratory specimens during the acute phase of infection.  Positive  results are indicative of active infection with SARS-CoV-2.  Clinical  correlation with patient history and other diagnostic information is  necessary to determine patient infection status.  Positive results do  not rule out bacterial infection or co-infection with other viruses. If result is PRESUMPTIVE POSTIVE SARS-CoV-2 nucleic acids MAY BE PRESENT.   A presumptive positive result was obtained on the submitted  specimen  and confirmed on repeat testing.  While 2019 novel coronavirus  (SARS-CoV-2) nucleic acids may be present in the submitted sample  additional confirmatory testing may be necessary for epidemiological  and / or clinical management purposes  to differentiate between  SARS-CoV-2 and other Sarbecovirus currently known to infect humans.  If clinically indicated additional testing with an alternate test  methodology (L [id number]) is advised. The SARS-CoV-2 RNA is generally  detectable in upper and lower respiratory specimens during the acute  phase of infection. The expected result is Negative. Fact Sheet for Patients:  StrictlyIdeas.no Fact Sheet for Healthcare Providers: BankingDealers.co.za This test is not yet approved or cleared by the [place] FDA and has been authorized for detection and/or diagnosis of SARS-CoV-2 by FDA under an Emergency Use Authorization (EUA).  This EUA will remain in effect (meaning this test can be used) for the duration of the COVID-19 declaration under Section 564(b)(1) of the Act, 21 U.S.C. section 360bbb-3(b)(1), unless the authorization is terminated or revoked sooner. Performed at [facility], [place] [address], [place], [place] [address]   Culture, blood (Routine X 2) w Reflex to ID Panel     Status: None   Collection Time: 11/17/18  3:26 PM  Result Value Ref Range Status   Specimen Description   Final    BLOOD LEFT ANTECUBITAL Performed at [facility] [address] [place], [place] [address]    Special Requests   Final    BOTTLES DRAWN AEROBIC AND ANAEROBIC Blood Culture adequate volume Performed at [facility] [address], [place], [place] [address]    Culture   Final    NO GROWTH 5 DAYS Performed at [facility], [place] [address] [place], [place] [address]    Report Status 11/22/2018 FINAL  Final  Culture, blood (Routine X 2) w  Reflex to ID Panel     Status: None   Collection Time: 11/17/18  3:27 PM  Result Value Ref Range Status   Specimen Description   Final    BLOOD LEFT HAND Performed at [place] [place] [address] [place], [place] [address]    Special Requests   Final    BOTTLES DRAWN AEROBIC AND ANAEROBIC Blood Culture adequate volume Performed at [place] [address] [place],  [place] [address]    Culture   Final    NO GROWTH 5 DAYS Performed at [facility], [place] [address], [place], [place] [address]    Report Status 11/22/2018 FINAL  Final  Culture, sputum-assessment     Status: None   Collection Time: 11/18/18  3:00 AM  Result Value Ref Range Status   Specimen Description SPUTUM  Final   Special Requests Normal  Final   Sputum evaluation   Final    Sputum specimen not acceptable for testing.  Please recollect.   RBV RN Mechele Claude AT 3154 11/18/18 CRUICKSHANK A Performed at [facility], [place] [address] [place], [place] [address]    Report Status 11/18/2018 FINAL  Final  Expectorated sputum assessment w rflx to resp cult     Status: None   Collection Time: 11/18/18  8:53 PM  Result Value Ref Range Status   Specimen Description EXPECTORATED SPUTUM  Final   Special Requests NONE  Final   Sputum evaluation   Final    THIS SPECIMEN IS ACCEPTABLE FOR SPUTUM CULTURE Performed at [facility], [place] [address], [place], [place] [address]    Report Status 11/19/2018 FINAL  Final  Culture, respiratory     Status: None   Collection Time: 11/18/18  8:53 PM  Result Value Ref Range Status   Specimen Description   Final    EXPECTORATED SPUTUM Performed at [facility] [address], [place], [place] [address]    Special Requests   Final    NONE Reflexed from [id number] Performed at [facility] [address], [place], [place] [address]    Gram Stain   Final    MODERATE WBC  PRESENT, PREDOMINANTLY PMN MODERATE GRAM NEGATIVE RODS MODERATE GRAM POSITIVE COCCI    Culture   Final    FEW Consistent with normal respiratory flora. Performed at [facility], [place] [address], [place], [place] [address]    Report Status 11/22/2018 FINAL  Final     Medications:   . albuterol  2 puff Inhalation TID  . enoxaparin (LOVENOX) injection  40 mg Subcutaneous Q12H  . furosemide  20 mg Intravenous BID  . nicotine  21 mg Transdermal Daily   Continuous Infusions: . azithromycin Stopped (11/24/18 1517)  . cefTRIAXone (ROCEPHIN)  IV Stopped (11/24/18 1606)       LOS: 7 days   Charlynne Cousins  [facility]  11/25/2018, 7:31 AM

## 2018-11-26 ENCOUNTER — Inpatient Hospital Stay (HOSPITAL_COMMUNITY): Payer: HRSA Program

## 2018-11-26 DIAGNOSIS — J9601 Acute respiratory failure with hypoxia: Secondary | ICD-10-CM

## 2018-11-26 DIAGNOSIS — R7989 Other specified abnormal findings of blood chemistry: Secondary | ICD-10-CM

## 2018-11-26 LAB — CBC WITH DIFFERENTIAL/PLATELET
Abs Immature Granulocytes: 0.04 10*3/uL (ref 0.00–0.07)
Basophils Absolute: 0 10*3/uL (ref 0.0–0.1)
Basophils Relative: 0 %
Eosinophils Absolute: 0.1 10*3/uL (ref 0.0–0.5)
Eosinophils Relative: 1 %
HCT: 43.5 % (ref 39.0–52.0)
Hemoglobin: 14.2 g/dL (ref 13.0–17.0)
Immature Granulocytes: 1 %
Lymphocytes Relative: 15 %
Lymphs Abs: 1.3 10*3/uL (ref 0.7–4.0)
MCH: 31.8 pg (ref 26.0–34.0)
MCHC: 32.6 g/dL (ref 30.0–36.0)
MCV: 97.5 fL (ref 80.0–100.0)
Monocytes Absolute: 0.6 10*3/uL (ref 0.1–1.0)
Monocytes Relative: 7 %
Neutro Abs: 6.7 10*3/uL (ref 1.7–7.7)
Neutrophils Relative %: 76 %
Platelets: 343 10*3/uL (ref 150–400)
RBC: 4.46 MIL/uL (ref 4.22–5.81)
RDW: 12 % (ref 11.5–15.5)
WBC: 8.8 10*3/uL (ref 4.0–10.5)
nRBC: 0 % (ref 0.0–0.2)

## 2018-11-26 LAB — COMPREHENSIVE METABOLIC PANEL
ALT: 83 U/L — ABNORMAL HIGH (ref 0–44)
AST: 49 U/L — ABNORMAL HIGH (ref 15–41)
Albumin: 3.1 g/dL — ABNORMAL LOW (ref 3.5–5.0)
Alkaline Phosphatase: 106 U/L (ref 38–126)
Anion gap: 10 (ref 5–15)
BUN: 15 mg/dL (ref 6–20)
CO2: 22 mmol/L (ref 22–32)
Calcium: 9 mg/dL (ref 8.9–10.3)
Chloride: 102 mmol/L (ref 98–111)
Creatinine, Ser: 0.85 mg/dL (ref 0.61–1.24)
GFR calc Af Amer: >60 mL/min (ref >60)
GFR calc non Af Amer: >60 mL/min (ref >60)
Glucose, Bld: 107 mg/dL — ABNORMAL HIGH (ref 70–99)
Potassium: 3.8 mmol/L (ref 3.5–5.1)
Sodium: 134 mmol/L — ABNORMAL LOW (ref 135–145)
Total Bilirubin: 0.8 mg/dL (ref 0.3–1.2)
Total Protein: 8.1 g/dL (ref 6.5–8.1)

## 2018-11-26 LAB — HEPATITIS PANEL, ACUTE
HCV Ab: 0.1 s/co ratio (ref 0.0–0.9)
Hep A IgM: NEGATIVE
Hep B C IgM: NEGATIVE
Hepatitis B Surface Ag: NEGATIVE

## 2018-11-26 LAB — MAGNESIUM: Magnesium: 2.2 mg/dL (ref 1.7–2.4)

## 2018-11-26 LAB — FERRITIN: Ferritin: 825 ng/mL — ABNORMAL HIGH (ref 24–336)

## 2018-11-26 LAB — C-REACTIVE PROTEIN: CRP: 7.6 mg/dL — ABNORMAL HIGH (ref <1.0)

## 2018-11-26 LAB — D-DIMER, QUANTITATIVE: D-Dimer, Quant: 13.28 ug/mL-FEU — ABNORMAL HIGH (ref 0.00–0.50)

## 2018-11-26 MED ORDER — POTASSIUM CHLORIDE CRYS ER 20 MEQ PO TBCR
40.0000 meq | EXTENDED_RELEASE_TABLET | Freq: Once | ORAL | Status: AC
  Administered 2018-11-26: 10:00:00 40 meq via ORAL
  Filled 2018-11-26: qty 2

## 2018-11-26 MED ORDER — ALBUTEROL SULFATE HFA 108 (90 BASE) MCG/ACT IN AERS: 2.0000 | INHALATION_SPRAY | Freq: Four times a day (QID) | RESPIRATORY_TRACT | Status: DC | PRN

## 2018-11-26 NOTE — Progress Notes (Signed)
BLE venous duplex has been completed. Refer to [facility] under chart review to view preliminary results.   11/26/2018  4:03 PM Yazlin Ekblad, Bonnye Fava

## 2018-11-26 NOTE — Progress Notes (Signed)
[facility] PROGRESS NOTE    Progress Note  Micheal Watson  IRC:[id number] DOB: 06-27-1961 DOA: 11/17/2018 PCP: Patient, No Pcp Per     Brief Narrative:   Micheal Watson is an 58 y.o. male tobacco abuse about a pack per day comes to the ED with fever chills myalgia shortness of breath at rest and exertion and a productive cough that started 2 to 3 days prior to admission.  In the ED: Was found to have a low-grade temperature, hypoxia on room air requiring 2 L of oxygen mild hyponatremia mild elevation in LFTs and a platelet count of 429 with a chest x-ray showing a left lower lobe infiltrate, COVID-19 test was positive  Subjective:   Micheal Watson has any chest pain, no shortness of breath, reports he has a good appetite  Assessment/Plan:   Acute respiratory failure due to Covid-19 Virus Infection: -Initially requiring 2 to 3 L nasal cannula, has improved on Plaquenil and azithromycin and with IV diuresis, is on 0.5 L nasal cannula this morning, have stopped, was saturating 95% on room air, discussed with staff to ambulate and evaluate his oxygen needs on exertion. -He was treated with total of 7 days of IV Rocephin and azithromycin -D-dimer is significantly elevated at 16 at 1 point, currently trending down, continue with DVT prophylaxis at 0.5 meg per kick every 12 hours. -Continue with IV diuresis, monitor labs closely -Encouraged to continue with incentive spirometry and awake prone today.   Elevated transaminitis: -Ending down, likely due to infection, negative hepatitis panel  Elevated D-dimers -Likely in the setting of hypercoagulable state due to COVID-19 infection, on Lovenox 0.5 meg per cake every 12 hours, will obtain venous Doppler, likely will need subcu Lovenox on discharge.   DVT prophylaxis: lovenox Family Communication:none Disposition Plan/Barrier to D/C: home if remains off oxygen, no hypoxia on exertion, and once venous Dopplers  are obtained Code Status:     Code Status Orders  (From admission, onward)         Start     Ordered   11/17/18 1413  Full code  Continuous     11/17/18 1412        Code Status History    This patient has a current code status but no historical code status.        IV Access:    Peripheral IV   Procedures and diagnostic studies:   No results found.   Medical Consultants:    None.  Anti-Infectives:   IV rocephin and azithro  Objective:    Vitals:   11/25/18 2239 11/26/18 0251 11/26/18 0600 11/26/18 0855  BP: (!) 123/92  (!) 118/92 124/84  Pulse: 88   85  Resp: (!) 22   20  Temp: 99 F (37.2 C) 99.9 F (37.7 C) 99.1 F (37.3 C) 99.2 F (37.3 C)  TempSrc: Oral   Oral  SpO2:    96%  Weight:      Height:        Intake/Output Summary (Last 24 hours) at 11/26/2018 1131 Last data filed at 11/26/2018 1000 Gross per 24 hour  Intake 2257.12 ml  Output 800 ml  Net 1457.12 ml   Filed Weights   11/17/18 1439 11/20/18 1732  Weight: 92.4 kg 87.4 kg    Exam:  Awake Alert, Oriented X 3, No new F.N deficits, Normal affect Symmetrical Chest wall movement, Good air movement bilaterally, CTAB RRR,No Gallops,Rubs or new Murmurs, No Parasternal Heave +ve B.Sounds, Abd Soft, No tenderness,  No rebound - guarding or rigidity. No Cyanosis, Clubbing or edema, No new Rash or bruise    Data Reviewed:    Labs: Basic Metabolic Panel: Recent Labs  Lab 11/20/18 0442 11/22/18 0620 11/23/18 0515 11/24/18 0500 11/25/18 0500 11/26/18 0500  NA 136 135 136 134* 135 134*  K 3.8 3.8 4.8 4.3 4.2 3.8  CL 103 97* 101 104 103 102  CO2 21* 24 25 20* 22 22  GLUCOSE 110* 103* 103* 113* 114* 107*  BUN 12 21* 19 15 14 15   CREATININE 0.87 1.16 1.03 0.84 0.89 0.85  CALCIUM 8.6* 9.0 9.1 8.7* 8.9 9.0  MG 2.2  --  2.3  --  2.1 2.2   GFR Estimated Creatinine Clearance: 103.7 mL/min (by C-G formula based on SCr of 0.85 mg/dL). Liver Function Tests: Recent Labs  Lab  11/22/18 0620 11/23/18 0515 11/24/18 0500 11/25/18 0500 11/26/18 0500  AST 74* 76* 61* 59* 49*  ALT 87* 98* 87* 90* 83*  ALKPHOS 119 119 109 112 106  BILITOT 1.6* 1.0 0.7 0.7 0.8  PROT 8.2* 8.1 7.5 7.8 8.1  ALBUMIN 3.1* 3.1* 2.8* 3.1* 3.1*   No results for input(s): LIPASE, AMYLASE in the last 168 hours. No results for input(s): AMMONIA in the last 168 hours. Coagulation profile No results for input(s): INR, PROTIME in the last 168 hours.  CBC: Recent Labs  Lab 11/22/18 0620 11/23/18 0515 11/24/18 0500 11/25/18 0500 11/26/18 0500  WBC 7.9 8.1 7.7 9.5 8.8  NEUTROABS  --  6.4 5.8 7.4 6.7  HGB 15.0 15.4 14.3 14.7 14.2  HCT 45.9 47.2 43.3 44.4 43.5  MCV 97.7 100.2* 97.7 97.6 97.5  PLT 372 316 329 280 343   Cardiac Enzymes: Recent Labs  Lab 11/20/18 0442 11/22/18 0620  CKTOTAL 29* 27*   BNP (last 3 results) No results for input(s): PROBNP in the last 8760 hours. CBG: No results for input(s): GLUCAP in the last 168 hours. D-Dimer: Recent Labs    11/25/18 0500 11/26/18 0500  DDIMER 15.81* 13.28*   Hgb A1c: No results for input(s): HGBA1C in the last 72 hours. Lipid Profile: No results for input(s): CHOL, HDL, LDLCALC, TRIG, CHOLHDL, LDLDIRECT in the last 72 hours. Thyroid function studies: No results for input(s): TSH, T4TOTAL, T3FREE, THYROIDAB in the last 72 hours.  Invalid input(s): FREET3 Anemia work up: Recent Labs    11/25/18 0500 11/26/18 0500  FERRITIN 796* 825*   Sepsis Labs: Recent Labs  Lab 11/21/18 1400 11/22/18 0620 11/23/18 0515 11/24/18 0500 11/25/18 0500 11/26/18 0500  PROCALCITON 1.27 1.50 1.66  --   --   --   WBC  --  7.9 8.1 7.7 9.5 8.8   Microbiology Recent Results (from the past 240 hour(s))  SARS Coronavirus 2 [facility] order, Performed in [place] hospital lab)     Status: Abnormal   Collection Time: 11/17/18 12:27 PM  Result Value Ref Range Status   SARS Coronavirus 2 POSITIVE (A) NEGATIVE Final    Comment:  CRITICAL RESULT CALLED TO, READ BACK BY AND VERIFIED WITH: Simon Rhein RN 15:25 11/17/18 ([email or web address]) (NOTE) If result is NEGATIVE SARS-CoV-2 target nucleic acids are NOT DETECTED. The SARS-CoV-2 RNA is generally detectable in upper and lower  respiratory specimens during the acute phase of infection. The lowest  concentration of SARS-CoV-2 viral copies this assay can detect is 250  copies / mL. A negative result does not preclude SARS-CoV-2 infection  and should not be used as the sole basis for  treatment or other  patient management decisions.  A negative result may occur with  improper specimen collection / handling, submission of specimen other  than nasopharyngeal swab, presence of viral mutation(s) within the  areas targeted by this assay, and inadequate number of viral copies  (<250 copies / mL). A negative result must be combined with clinical  observations, patient history, and epidemiological information. If result is POSITIVE SARS-CoV-2 target nucleic acids are DE TECTED. The SARS-CoV-2 RNA is generally detectable in upper and lower  respiratory specimens during the acute phase of infection.  Positive  results are indicative of active infection with SARS-CoV-2.  Clinical  correlation with patient history and other diagnostic information is  necessary to determine patient infection status.  Positive results do  not rule out bacterial infection or co-infection with other viruses. If result is PRESUMPTIVE POSTIVE SARS-CoV-2 nucleic acids MAY BE PRESENT.   A presumptive positive result was obtained on the submitted specimen  and confirmed on repeat testing.  While 2019 novel coronavirus  (SARS-CoV-2) nucleic acids may be present in the submitted sample  additional confirmatory testing may be necessary for epidemiological  and / or clinical management purposes  to differentiate between  SARS-CoV-2 and other Sarbecovirus currently known to infect humans.  If clinically indicated  additional testing with an alternate test  methodology (L [id number]) is advised. The SARS-CoV-2 RNA is generally  detectable in upper and lower respiratory specimens during the acute  phase of infection. The expected result is Negative. Fact Sheet for Patients:  StrictlyIdeas.no Fact Sheet for Healthcare Providers: BankingDealers.co.za This test is not yet approved or cleared by the [place] FDA and has been authorized for detection and/or diagnosis of SARS-CoV-2 by FDA under an Emergency Use Authorization (EUA).  This EUA will remain in effect (meaning this test can be used) for the duration of the COVID-19 declaration under Section 564(b)(1) of the Act, 21 U.S.C. section 360bbb-3(b)(1), unless the authorization is terminated or revoked sooner. Performed at [facility], [place] [address] [place], [place] [address]   Culture, blood (Routine X 2) w Reflex to ID Panel     Status: None   Collection Time: 11/17/18  3:26 PM  Result Value Ref Range Status   Specimen Description   Final    BLOOD LEFT ANTECUBITAL Performed at [facility] [address] [place], [place] [address]    Special Requests   Final    BOTTLES DRAWN AEROBIC AND ANAEROBIC Blood Culture adequate volume Performed at [facility] [address], [place], [place] [address]    Culture   Final    NO GROWTH 5 DAYS Performed at [facility], [place] [address] [place], [place] [address]    Report Status 11/22/2018 FINAL  Final  Culture, blood (Routine X 2) w Reflex to ID Panel     Status: None   Collection Time: 11/17/18  3:27 PM  Result Value Ref Range Status   Specimen Description   Final    BLOOD LEFT HAND Performed at [place] [address], [place], [place] [address]    Special Requests   Final    BOTTLES DRAWN AEROBIC AND ANAEROBIC Blood Culture adequate volume Performed at [facility] [address], [place], [place] [address]    Culture   Final    NO GROWTH 5 DAYS Performed at [facility], [place] [address], [place], [place] [address]  Report Status 11/22/2018 FINAL  Final  Culture, sputum-assessment     Status: None   Collection Time: 11/18/18  3:00 AM  Result Value Ref Range Status   Specimen Description SPUTUM  Final   Special Requests Normal  Final   Sputum evaluation   Final    Sputum specimen not acceptable for testing.  Please recollect.   RBV RN Mechele Claude AT 8466 11/18/18 CRUICKSHANK A Performed at [facility], [place] [address], [place], [place] [address]    Report Status 11/18/2018 FINAL  Final  Expectorated sputum assessment w rflx to resp cult     Status: None   Collection Time: 11/18/18  8:53 PM  Result Value Ref Range Status   Specimen Description EXPECTORATED SPUTUM  Final   Special Requests NONE  Final   Sputum evaluation   Final    THIS SPECIMEN IS ACCEPTABLE FOR SPUTUM CULTURE Performed at [facility], [place] [address] [place], [place] [address]    Report Status 11/19/2018 FINAL  Final  Culture, respiratory     Status: None   Collection Time: 11/18/18  8:53 PM  Result Value Ref Range Status   Specimen Description   Final    EXPECTORATED SPUTUM Performed at [facility] [address], [place], [place] [address]    Special Requests   Final    NONE Reflexed from [id number] Performed at [place] [address], [place], [place] [address]    Gram Stain   Final    MODERATE WBC PRESENT, PREDOMINANTLY PMN MODERATE GRAM NEGATIVE RODS MODERATE GRAM POSITIVE COCCI    Culture   Final    FEW Consistent with normal respiratory flora. Performed at [facility], [place] [address] [place], [place] [address]    Report Status 11/22/2018 FINAL  Final     Medications:   . enoxaparin (LOVENOX) injection  40 mg Subcutaneous Q12H  . nicotine  21  mg Transdermal Daily   Continuous Infusions:      LOS: 8 days   Phillips Climes MD [facility] Pager: [email or web address]  11/26/2018, 11:31 AM

## 2018-11-26 NOTE — Progress Notes (Signed)
Called Joses Molasco at [phone] to give updates on patient and answer questions and concerns regarding patient. Mr. Pieter Partridge is a family friend and speaks English so he will transfer all updates to patient family who only speaks spanish. Told Mr. Pieter Partridge to feel free to call with any questions regarding patient.

## 2018-11-26 NOTE — Progress Notes (Signed)
Follow up appointment with [facility] 12/14/2018 at 1:00 PM.

## 2018-11-26 NOTE — Progress Notes (Signed)
Pt has no insurance will need MATCH at discharge for Lovenox.

## 2018-11-27 DIAGNOSIS — J069 Acute upper respiratory infection, unspecified: Secondary | ICD-10-CM

## 2018-11-27 LAB — COMPREHENSIVE METABOLIC PANEL
ALT: 84 U/L — ABNORMAL HIGH (ref 0–44)
AST: 49 U/L — ABNORMAL HIGH (ref 15–41)
Albumin: 3.1 g/dL — ABNORMAL LOW (ref 3.5–5.0)
Alkaline Phosphatase: 101 U/L (ref 38–126)
Anion gap: 9 (ref 5–15)
BUN: 15 mg/dL (ref 6–20)
CO2: 22 mmol/L (ref 22–32)
Calcium: 8.8 mg/dL — ABNORMAL LOW (ref 8.9–10.3)
Chloride: 102 mmol/L (ref 98–111)
Creatinine, Ser: 0.92 mg/dL (ref 0.61–1.24)
GFR calc Af Amer: >60 mL/min (ref >60)
GFR calc non Af Amer: >60 mL/min (ref >60)
Glucose, Bld: 105 mg/dL — ABNORMAL HIGH (ref 70–99)
Potassium: 4.4 mmol/L (ref 3.5–5.1)
Sodium: 133 mmol/L — ABNORMAL LOW (ref 135–145)
Total Bilirubin: 0.8 mg/dL (ref 0.3–1.2)
Total Protein: 7.8 g/dL (ref 6.5–8.1)

## 2018-11-27 LAB — CBC WITH DIFFERENTIAL/PLATELET
Abs Immature Granulocytes: 0.04 10*3/uL (ref 0.00–0.07)
Basophils Absolute: 0.1 10*3/uL (ref 0.0–0.1)
Basophils Relative: 1 %
Eosinophils Absolute: 0.1 10*3/uL (ref 0.0–0.5)
Eosinophils Relative: 1 %
HCT: 42.9 % (ref 39.0–52.0)
Hemoglobin: 14.3 g/dL (ref 13.0–17.0)
Immature Granulocytes: 1 %
Lymphocytes Relative: 19 %
Lymphs Abs: 1.5 10*3/uL (ref 0.7–4.0)
MCH: 32.9 pg (ref 26.0–34.0)
MCHC: 33.3 g/dL (ref 30.0–36.0)
MCV: 98.8 fL (ref 80.0–100.0)
Monocytes Absolute: 0.8 10*3/uL (ref 0.1–1.0)
Monocytes Relative: 10 %
Neutro Abs: 5.5 10*3/uL (ref 1.7–7.7)
Neutrophils Relative %: 68 %
Platelets: 353 10*3/uL (ref 150–400)
RBC: 4.34 MIL/uL (ref 4.22–5.81)
RDW: 11.9 % (ref 11.5–15.5)
WBC: 7.8 10*3/uL (ref 4.0–10.5)
nRBC: 0 % (ref 0.0–0.2)

## 2018-11-27 LAB — FERRITIN: Ferritin: 761 ng/mL — ABNORMAL HIGH (ref 24–336)

## 2018-11-27 LAB — D-DIMER, QUANTITATIVE: D-Dimer, Quant: 11.01 ug/mL-FEU — ABNORMAL HIGH (ref 0.00–0.50)

## 2018-11-27 LAB — C-REACTIVE PROTEIN: CRP: 7.2 mg/dL — ABNORMAL HIGH (ref <1.0)

## 2018-11-27 MED ORDER — PANTOPRAZOLE SODIUM 40 MG PO TBEC: 40.0000 mg | DELAYED_RELEASE_TABLET | Freq: Every day | ORAL | 0 refills | Status: DC

## 2018-11-27 MED ORDER — ENOXAPARIN SODIUM 40 MG/0.4ML ~~LOC~~ SOLN: 40.0000 mg | SUBCUTANEOUS | 0 refills | Status: DC

## 2018-11-27 NOTE — Discharge Summary (Signed)
Micheal Watson, is a 58 y.o. male  DOB [birth year]  MRN [id number].  Admission date:  11/17/2018  Admitting Physician  Hosie Poisson, MD  Discharge Date:  11/27/2018   Primary MD  Patient, No Pcp Per  Recommendations for primary care physician for things to follow:  -Check CBC CMP during next visit -Patient was given [place] [facility] infection prevention recommendations  Admission Diagnosis  Shortness of breath [R06.02] Cough [R05] Suspected Covid-19 Virus Infection [R68.89]   Discharge Diagnosis  Shortness of breath [R06.02] Cough [R05] Suspected Covid-19 Virus Infection [R68.89]    Principal Problem:   COVID-19 virus infection Active Problems:   Acute on chronic respiratory failure with hypoxia (HCC)   SIRS (systemic inflammatory response syndrome) ([place])   Acute respiratory disease due to COVID-19 virus      History reviewed. No pertinent past medical history.  History reviewed. No pertinent surgical history.     History of present illness and  Hospital Course:     Kindly see H&P for history of present illness and admission details, please review complete Labs, Consult reports and Test reports for all details in brief  HPI  from the history and physical done on the day of admission 11/17/2018  HPI: [name] is a 58 y.o. male with no significant past medical history presents to ED with fever , chills, myalgias, sob at rest and on exertion and cough, no productive since 2 to 3 days. Pt reports 3 of his family members have the same symptoms. He reports smoking daily up to a pack of cigarettes a day. He denies any chest pain, nausea, vomiting , diarrhea, abdominal pain, headache, dizziness, lightheadedness. No dysuria. Pt denies any travel history. He denies any pedal edema, syncope or orthopnea.   ED Course: low grade temp, hypoxic on RA, requiring up to  2 lit of [place] oxygen, labs reveal mild hyponatremia, mildly elevated liver function tests and platelets of 429. CXR shows  Abnormal density at the left lateral costophrenic angle worrisome for lingular or lower lobe pneumonia. Question other scattered areas of hazy infiltrate in the right, not definite.  Troponin is negative.  EKG shows sinus rhythm without any ischemic changes  Hospital Course  [name] is an 58 y.o. male tobacco abuse about a pack per day comes to the ED with fever chills myalgia shortness of breath at rest and exertion and a productive cough that started 2 to 3 days prior to admission.  In the ED: Was found to have a low-grade temperature, hypoxia on room air requiring 2 L of oxygen mild hyponatremia mild elevation in LFTs and a platelet count of 429 with a chest x-ray showing a left lower lobe infiltrate, COVID-19 test was positive  Acute respiratory failure due to Covid-19 Virus Infection: -Initially requiring 2 to 3 L nasal cannula, has improved on Plaquenil and azithromycin and with IV diuresis, he is on room or over last 24 hours, duration around 95% . -He was treated with total of 7 days of  IV Rocephin and azithromycin -Encouraged to continue with incentive spirometry and awake prone position even after discharge.   Elevated transaminitis: -Ending down, likely due to infection, negative hepatitis panel  Elevated D-dimers -D-dimer is significantly elevated at 16 at one point, currently trending down, he was treated with DVT prophylaxis dose at 0.5 mg/Kg every 12 hours. -Likely in the setting of hypercoagulable state due to COVID-19 infection, will be discharged on another 10 days of DVT prophylaxis.Lovenox 40 mg subcu daily . -venous Doppler negative for DVT   Discharge Condition:  stable   Follow UP  Follow-up Information    Lanae Boast, FNP Follow up.   Specialty:  Family Medicine Why:  [facility] 12/14/2018 at 1:00 PM.  Contact information: [place] [address] [phone]             Discharge Instructions  and  Discharge Medications    Discharge Instructions    Discharge instructions   Complete by:  As directed    Follow with Primary MD  7 days   Get CBC, CMP, 2 view Chest X ray checked  by Primary MD next visit.    Activity: As tolerated with Full fall precautions use walker/cane & assistance as needed   Disposition Home    Diet: Regular diet  For Heart failure patients - Check your Weight same time everyday, if you gain over 2 pounds, or you develop in leg swelling, experience more shortness of breath or chest pain, call your Primary MD immediately. Follow Cardiac Low Salt Diet and 1.5 lit/day fluid restriction.   On your next visit with your primary care physician please Get Medicines reviewed and adjusted.   Please request your Prim.MD to go over all Hospital Tests and Procedure/Radiological results at the follow up, please get all Hospital records sent to your Prim MD by signing hospital release before you go home.   If you experience worsening of your admission symptoms, develop shortness of breath, life threatening emergency, suicidal or homicidal thoughts you must seek medical attention immediately by calling 911 or calling your MD immediately  if symptoms less severe.  You Must read complete instructions/literature along with all the possible adverse reactions/side effects for all the Medicines you take and that have been prescribed to you. Take any new Medicines after you have completely understood and accpet all the possible adverse reactions/side effects.   Do not drive, operating heavy machinery, perform activities at heights, swimming or participation in water activities or provide baby sitting services if your were admitted for syncope or siezures until you have seen by Primary MD or a Neurologist and advised to do so again.  Do not drive when  taking Pain medications.    Do not take more than prescribed Pain, Sleep and Anxiety Medications  Special Instructions: If you have smoked or chewed Tobacco  in the last 2 yrs please stop smoking, stop any regular Alcohol  and or any Recreational drug use.  Wear Seat belts while driving.   Please note  You were cared for by a hospitalist during your hospital stay. If you have any questions about your discharge medications or the care you received while you were in the hospital after you are discharged, you can call the unit and asked to speak with the hospitalist on call if the hospitalist that took care of you is not available. Once you are discharged, your primary care physician will handle any further medical issues. Please note  that NO REFILLS for any discharge medications will be authorized once you are discharged, as it is imperative that you return to your primary care physician (or establish a relationship with a primary care physician if you do not have one) for your aftercare needs so that they can reassess your need for medications and monitor your lab values.   Increase activity slowly   Complete by:  As directed      Allergies as of 11/27/2018   No Known Allergies     Medication List    TAKE these medications   enoxaparin 40 MG/0.4ML injection Commonly known as:  LOVENOX Inject 0.4 mLs (40 mg total) into the skin daily.   pantoprazole 40 MG tablet Commonly known as:  Protonix Take 1 tablet (40 mg total) by mouth daily.   polyvinyl alcohol 1.4 % ophthalmic solution Commonly known as:  LIQUIFILM TEARS Place 1 drop into both eyes as needed for dry eyes (allergies).         Diet and Activity recommendation: See Discharge Instructions above   Consults obtained -  None   Major procedures and Radiology Reports - PLEASE review detailed and final reports for all details, in brief -     Dg Chest Port 1 View  Result Date: 11/23/2018 CLINICAL DATA:  57 year old  male with fever EXAM: PORTABLE CHEST 1 VIEW COMPARISON:  None. FINDINGS: Cardiomediastinal silhouette within normal limits. Reticulonodular opacity at the periphery of the right lung and minimally at the left lung apex. No pleural effusion or pneumothorax. IMPRESSION: Reticulonodular pattern of opacity may indicate multifocal infection, including atypical pathogen. Electronically Signed   By: Corrie Mckusick D.O.   On: 11/23/2018 10:49   Dg Chest Port 1 View  Result Date: 11/17/2018 CLINICAL DATA:  Cough, shortness of breath and fever over the last 5 days. EXAM: PORTABLE CHEST 1 VIEW COMPARISON:  None FINDINGS: Heart size is normal. Mediastinal shadows are normal. There is abnormal density in the left lateral costophrenic angle which could be due to lingular or lower lobe pneumonia. Question areas of hazy patchy infiltrate on the right, not definite. IMPRESSION: Abnormal density at the left lateral costophrenic angle worrisome for lingular or lower lobe pneumonia. Question other scattered areas of hazy infiltrate in the right, not definite. Electronically Signed   By: Nelson Chimes M.D.   On: 11/17/2018 10:33   Vas [place] Lower Extremity Venous (dvt)  Result Date: 11/26/2018  Lower Venous Study Indications: Elevated d-Dimer, Positive Covid-19 pt, and hypoxia.  Comparison Study: Performing Technologist: Oda Cogan RDMS, RVT  Examination Guidelines: A complete evaluation includes B-mode imaging, spectral Doppler, color Doppler, and power Doppler as needed of all accessible portions of each vessel. Bilateral testing is considered an integral part of a complete examination. Limited examinations for reoccurring indications may be performed as noted.  +---------+---------------+---------+-----------+----------+-------+ RIGHT    CompressibilityPhasicitySpontaneityPropertiesSummary +---------+---------------+---------+-----------+----------+-------+ CFV      Full           Yes      Yes                           +---------+---------------+---------+-----------+----------+-------+ SFJ      Full                                                 +---------+---------------+---------+-----------+----------+-------+ FV Prox  Full                                                 +---------+---------------+---------+-----------+----------+-------+ FV Mid   Full                                                 +---------+---------------+---------+-----------+----------+-------+ FV DistalFull                                                 +---------+---------------+---------+-----------+----------+-------+ PFV      Full                                                 +---------+---------------+---------+-----------+----------+-------+ POP      Full           Yes                                   +---------+---------------+---------+-----------+----------+-------+ PTV      Full                                                 +---------+---------------+---------+-----------+----------+-------+ PERO     Full                                                 +---------+---------------+---------+-----------+----------+-------+   +---------+---------------+---------+-----------+----------+-------+ LEFT     CompressibilityPhasicitySpontaneityPropertiesSummary +---------+---------------+---------+-----------+----------+-------+ CFV      Full           Yes      Yes                          +---------+---------------+---------+-----------+----------+-------+ SFJ      Full                                                 +---------+---------------+---------+-----------+----------+-------+ FV Prox  Full                                                 +---------+---------------+---------+-----------+----------+-------+ FV Mid   Full                                                 +---------+---------------+---------+-----------+----------+-------+ FV  DistalFull                                                 +---------+---------------+---------+-----------+----------+-------+ PFV      Full                                                 +---------+---------------+---------+-----------+----------+-------+ POP      Full           Yes      Yes                          +---------+---------------+---------+-----------+----------+-------+ PTV      Full                                                 +---------+---------------+---------+-----------+----------+-------+ PERO     Full                                                 +---------+---------------+---------+-----------+----------+-------+     Summary: Right: There is no evidence of deep vein thrombosis in the lower extremity. No cystic structure found in the popliteal fossa. Left: There is no evidence of deep vein thrombosis in the lower extremity. No cystic structure found in the popliteal fossa.  *See table(s) above for measurements and observations. Electronically signed by Servando Snare MD on 11/26/2018 at 5:09:41 PM.    Final     Micro Results   Recent Results (from the past 240 hour(s))  SARS Coronavirus 2 [facility] order, Performed in [facility] hospital lab)     Status: Abnormal   Collection Time: 11/17/18 12:27 PM  Result Value Ref Range Status   SARS Coronavirus 2 POSITIVE (A) NEGATIVE Final    Comment: CRITICAL RESULT CALLED TO, READ BACK BY AND VERIFIED WITH: Simon Rhein RN 15:25 11/17/18 ([email or web address]) (NOTE) If result is NEGATIVE SARS-CoV-2 target nucleic acids are NOT DETECTED. The SARS-CoV-2 RNA is generally detectable in upper and lower  respiratory specimens during the acute phase of infection. The lowest  concentration of SARS-CoV-2 viral copies this assay can detect is 250  copies / mL. A negative result does not preclude SARS-CoV-2 infection  and should not be used as the sole basis for treatment or other  patient management decisions.  A  negative result may occur with  improper specimen collection / handling, submission of specimen other  than nasopharyngeal swab, presence of viral mutation(s) within the  areas targeted by this assay, and inadequate number of viral copies  (<250 copies / mL). A negative result must be combined with clinical  observations, patient history, and epidemiological information. If result is POSITIVE SARS-CoV-2 target nucleic acids are DE TECTED. The SARS-CoV-2 RNA is generally detectable in upper and lower  respiratory specimens during the acute phase of infection.  Positive  results are indicative of active infection with SARS-CoV-2.  Clinical  correlation with patient history and other diagnostic information is  necessary to determine patient infection status.  Positive results do  not rule out bacterial infection or co-infection with other viruses. If result is PRESUMPTIVE POSTIVE SARS-CoV-2 nucleic acids MAY BE PRESENT.   A presumptive positive result was obtained on the submitted specimen  and confirmed on repeat testing.  While 2019 novel coronavirus  (SARS-CoV-2) nucleic acids may be present in the submitted sample  additional confirmatory testing may be necessary for epidemiological  and / or clinical management purposes  to differentiate between  SARS-CoV-2 and other Sarbecovirus currently known to infect humans.  If clinically indicated additional testing with an alternate test  methodology (L [id number]) is advised. The SARS-CoV-2 RNA is generally  detectable in upper and lower respiratory specimens during the acute  phase of infection. The expected result is Negative. Fact Sheet for Patients:  StrictlyIdeas.no Fact Sheet for Healthcare Providers: BankingDealers.co.za This test is not yet approved or cleared by the [place] FDA and has been authorized for detection and/or diagnosis of SARS-CoV-2 by FDA under an Emergency Use  Authorization (EUA).  This EUA will remain in effect (meaning this test can be used) for the duration of the COVID-19 declaration under Section 564(b)(1) of the Act, 21 U.S.C. section 360bbb-3(b)(1), unless the authorization is terminated or revoked sooner. Performed at [facility], [place] [address] [place], [place] [address]   Culture, blood (Routine X 2) w Reflex to ID Panel     Status: None   Collection Time: 11/17/18  3:26 PM  Result Value Ref Range Status   Specimen Description   Final    BLOOD LEFT ANTECUBITAL Performed at [place] [address], [place], [place] [address]    Special Requests   Final    BOTTLES DRAWN AEROBIC AND ANAEROBIC Blood Culture adequate volume Performed at [place] [address] [place], [place] [address]    Culture   Final    NO GROWTH 5 DAYS Performed at [facility], [place] [address], [place], [place] [address]    Report Status 11/22/2018 FINAL  Final  Culture, blood (Routine X 2) w Reflex to ID Panel     Status: None   Collection Time: 11/17/18  3:27 PM  Result Value Ref Range Status   Specimen Description   Final    BLOOD LEFT HAND Performed at [place] [address], [place], [place] [address]    Special Requests   Final    BOTTLES DRAWN AEROBIC AND ANAEROBIC Blood Culture adequate volume Performed at [facility] [address], [place], [place] [address]    Culture   Final    NO GROWTH 5 DAYS Performed at [facility], [place] [address] [place], [place] [address]    Report Status 11/22/2018 FINAL  Final  Culture, sputum-assessment     Status: None   Collection Time: 11/18/18  3:00 AM  Result Value Ref Range Status   Specimen Description SPUTUM  Final   Special Requests Normal  Final   Sputum evaluation   Final    Sputum specimen not acceptable for testing.  Please recollect.   RBV RN Mechele Claude AT 4332 11/18/18 CRUICKSHANK A  Performed at [facility], [place] [address], [place], [place] [address]    Report Status 11/18/2018 FINAL  Final  Expectorated sputum assessment w rflx to resp cult     Status: None   Collection Time: 11/18/18  8:53 PM  Result Value  Ref Range Status   Specimen Description EXPECTORATED SPUTUM  Final   Special Requests NONE  Final   Sputum evaluation   Final    THIS SPECIMEN IS ACCEPTABLE FOR SPUTUM CULTURE Performed at [facility], [place] [address], [place], [place] [address]    Report Status 11/19/2018 FINAL  Final  Culture, respiratory     Status: None   Collection Time: 11/18/18  8:53 PM  Result Value Ref Range Status   Specimen Description   Final    EXPECTORATED SPUTUM Performed at [place] [address], [place], [place] [address]    Special Requests   Final    NONE Reflexed from [phone] Performed at [place] [address], [place], [place] [address]    Gram Stain   Final    MODERATE WBC PRESENT, PREDOMINANTLY PMN MODERATE GRAM NEGATIVE RODS MODERATE GRAM POSITIVE COCCI    Culture   Final    FEW Consistent with normal respiratory flora. Performed at [facility], [place] [address] [place], [place] [address]    Report Status 11/22/2018 FINAL  Final       Today   Subjective:   Val Weiskopf today has no headache,no chest abdominal pain,no new weakness tingling or numbness, he denies any complains today.   Objective:   Blood pressure 123/81, pulse 85, temperature 99 F (37.2 C), temperature source Oral, resp. rate (!) 28, height 5\' 9"  (1.753 m), weight 87.4 kg, SpO2 93 %.   Intake/Output Summary (Last 24 hours) at 11/27/2018 0957 Last data filed at 11/26/2018 2200 Gross per 24 hour  Intake -  Output 625 ml  Net -625 ml    Exam Awake Alert, Oriented x 3, No new F.N deficits, Normal affect Symmetrical Chest wall movement, Good air movement bilaterally, CTAB  RRR,No Gallops,Rubs or new Murmurs, No Parasternal Heave +ve B.Sounds, Abd Soft, Non tender,  No rebound -guarding or rigidity. No Cyanosis, Clubbing or edema, No new Rash or bruise  Data Review   CBC w Diff:  Lab Results  Component Value Date   WBC 7.8 11/27/2018   HGB 14.3 11/27/2018   HCT 42.9 11/27/2018   PLT 353 11/27/2018   LYMPHOPCT 19 11/27/2018   MONOPCT 10 11/27/2018   EOSPCT 1 11/27/2018   BASOPCT 1 11/27/2018    CMP:  Lab Results  Component Value Date   NA 133 (L) 11/27/2018   K 4.4 11/27/2018   CL 102 11/27/2018   CO2 22 11/27/2018   BUN 15 11/27/2018   CREATININE 0.92 11/27/2018   PROT 7.8 11/27/2018   ALBUMIN 3.1 (L) 11/27/2018   BILITOT 0.8 11/27/2018   ALKPHOS 101 11/27/2018   AST 49 (H) 11/27/2018   ALT 84 (H) 11/27/2018  .   Total Time in preparing paper work, data evaluation and todays exam - 53 minutes  Phillips Climes M.D on 11/27/2018 at 9:57 AM  [facility]   Office  [phone]

## 2018-11-27 NOTE — Progress Notes (Signed)
Left via wheelchair-by staff @ [phone] with pt

## 2018-11-27 NOTE — Progress Notes (Signed)
Pt discharged to [facility] with family support

## 2018-11-27 NOTE — Progress Notes (Signed)
Pt discharging with [place] and appointment at [facility].

## 2018-11-27 NOTE — Discharge Instructions (Signed)
Person Under Monitoring Name: Micheal Watson  Location: [place] [address]   Infection Prevention Recommendations for Individuals Confirmed to have, or Being Evaluated for, 2019 Novel Coronavirus (COVID-19) Infection Who Receive Care at Home  Individuals who are confirmed to have, or are being evaluated for, COVID-19 should follow the prevention steps below until a healthcare provider or local or state health department says they can return to normal activities.  Stay home except to get medical care You should restrict activities outside your home, except for getting medical care. Do not go to work, school, or public areas, and do not use public transportation or taxis.  Call ahead before visiting your doctor Before your medical appointment, call the healthcare provider and tell them that you have, or are being evaluated for, COVID-19 infection. This will help the healthcare providers office take steps to keep other people from getting infected. Ask your healthcare provider to call the local or state health department.  Monitor your symptoms Seek prompt medical attention if your illness is worsening (e.g., difficulty breathing). Before going to your medical appointment, call the healthcare provider and tell them that you have, or are being evaluated for, COVID-19 infection. Ask your healthcare provider to call the local or state health department.  Wear a facemask You should wear a facemask that covers your nose and mouth when you are in the same room with other people and when you visit a healthcare provider. People who live with or visit you should also wear a facemask while they are in the same room with you.  Separate yourself from other people in your home As much as possible, you should stay in a different room from other people in your home. Also, you should use a separate bathroom, if available.  Avoid sharing household items You should not  share dishes, drinking glasses, cups, eating utensils, towels, bedding, or other items with other people in your home. After using these items, you should wash them thoroughly with soap and water.  Cover your coughs and sneezes Cover your mouth and nose with a tissue when you cough or sneeze, or you can cough or sneeze into your sleeve. Throw used tissues in a lined trash can, and immediately wash your hands with soap and water for at least 20 seconds or use an alcohol-based hand rub.  Wash your [facility] your hands often and thoroughly with soap and water for at least 20 seconds. You can use an alcohol-based hand sanitizer if soap and water are not available and if your hands are not visibly dirty. Avoid touching your eyes, nose, and mouth with unwashed hands.   Prevention Steps for Caregivers and Household Members of Individuals Confirmed to have, or Being Evaluated for, COVID-19 Infection Being Cared for in the Home  If you live with, or provide care at home for, a person confirmed to have, or being evaluated for, COVID-19 infection please follow these guidelines to prevent infection:  Follow healthcare providers instructions Make sure that you understand and can help the patient follow any healthcare provider instructions for all care.  Provide for the patients basic needs You should help the patient with basic needs in the home and provide support for getting groceries, prescriptions, and other personal needs.  Monitor the patients symptoms If they are getting sicker, call his or her medical provider and tell them that the patient has, or is being evaluated for, COVID-19 infection. This will help the healthcare providers office take  steps to keep other people from getting infected. Ask the healthcare provider to call the local or state health department.  Limit the number of people who have contact with the patient  If possible, have only one caregiver for the  patient.  Other household members should stay in another home or place of residence. If this is not possible, they should stay  in another room, or be separated from the patient as much as possible. Use a separate bathroom, if available.  Restrict visitors who do not have an essential need to be in the home.  Keep older adults, very young children, and other sick people away from the patient Keep older adults, very young children, and those who have compromised immune systems or chronic health conditions away from the patient. This includes people with chronic heart, lung, or kidney conditions, diabetes, and cancer.  Ensure good ventilation Make sure that shared spaces in the home have good air flow, such as from an air conditioner or an opened window, weather permitting.  Wash your hands often  Wash your hands often and thoroughly with soap and water for at least 20 seconds. You can use an alcohol based hand sanitizer if soap and water are not available and if your hands are not visibly dirty.  Avoid touching your eyes, nose, and mouth with unwashed hands.  Use disposable paper towels to dry your hands. If not available, use dedicated cloth towels and replace them when they become wet.  Wear a facemask and gloves  Wear a disposable facemask at all times in the room and gloves when you touch or have contact with the patients blood, body fluids, and/or secretions or excretions, such as sweat, saliva, sputum, nasal mucus, vomit, urine, or feces.  Ensure the mask fits over your nose and mouth tightly, and do not touch it during use.  Throw out disposable facemasks and gloves after using them. Do not reuse.  Wash your hands immediately after removing your facemask and gloves.  If your personal clothing becomes contaminated, carefully remove clothing and launder. Wash your hands after handling contaminated clothing.  Place all used disposable facemasks, gloves, and other waste in a lined  container before disposing them with other household waste.  Remove gloves and wash your hands immediately after handling these items.  Do not share dishes, glasses, or other household items with the patient  Avoid sharing household items. You should not share dishes, drinking glasses, cups, eating utensils, towels, bedding, or other items with a patient who is confirmed to have, or being evaluated for, COVID-19 infection.  After the person uses these items, you should wash them thoroughly with soap and water.  Wash laundry thoroughly  Immediately remove and wash clothes or bedding that have blood, body fluids, and/or secretions or excretions, such as sweat, saliva, sputum, nasal mucus, vomit, urine, or feces, on them.  Wear gloves when handling laundry from the patient.  Read and follow directions on labels of laundry or clothing items and detergent. In general, wash and dry with the warmest temperatures recommended on the label.  Clean all areas the individual has used often  Clean all touchable surfaces, such as counters, tabletops, doorknobs, bathroom fixtures, toilets, phones, keyboards, tablets, and bedside tables, every day. Also, clean any surfaces that may have blood, body fluids, and/or secretions or excretions on them.  Wear gloves when cleaning surfaces the patient has come in contact with.  Use a diluted bleach solution (e.g., dilute bleach with 1 part bleach  and 10 parts water) or a household disinfectant with a label that says EPA-registered for coronaviruses. To make a bleach solution at home, add 1 tablespoon of bleach to 1 quart (4 cups) of water. For a larger supply, add  cup of bleach to 1 gallon (16 cups) of water.  Read labels of cleaning products and follow recommendations provided on product labels. Labels contain instructions for safe and effective use of the cleaning product including precautions you should take when applying the product, such as wearing gloves or  eye protection and making sure you have good ventilation during use of the product.  Remove gloves and wash hands immediately after cleaning.  Monitor yourself for signs and symptoms of illness Caregivers and household members are considered close contacts, should monitor their health, and will be asked to limit movement outside of the home to the extent possible. Follow the monitoring steps for close contacts listed on the symptom monitoring form.   ? If you have additional questions, contact your local health department or call the epidemiologist on call at [phone] (available 24/7). ? This guidance is subject to change. For the most up-to-date guidance from [facility], please refer to their website: [email or web address]

## 2018-11-28 NOTE — Progress Notes (Signed)
Pt in bed resting - still awaiting a ride, pt says someone will be here soon.

## 2018-11-28 NOTE — Progress Notes (Signed)
Pt has been attempting to make arrangements for someone to come pick him up. At this time facing difficulty as family wants a friend who drives a taxi to come get him. Will await notification ats to when ride arrives.

## 2018-12-14 ENCOUNTER — Ambulatory Visit: Payer: Self-pay | Admitting: Family Medicine

## 2018-12-23 ENCOUNTER — Telehealth: Payer: Self-pay

## 2018-12-23 NOTE — Telephone Encounter (Signed)
Called, patient answered but hung up. Tried to reach patient regarding appointment on 12/24/2018. Patient needs to be seen via TELE visit due to positive covid-19 test.

## 2018-12-24 ENCOUNTER — Ambulatory Visit: Payer: Self-pay | Admitting: Family Medicine

## 2018-12-30 ENCOUNTER — Telehealth: Payer: Self-pay | Admitting: *Deleted

## 2018-12-30 NOTE — Telephone Encounter (Signed)
Called pt's and left a message for him regarding donating plasma.  Asked him to call [phone] let person answering the phone know it was pertaining to donating plasma.

## 2019-04-09 ENCOUNTER — Inpatient Hospital Stay (HOSPITAL_COMMUNITY): Payer: Self-pay

## 2019-04-09 ENCOUNTER — Inpatient Hospital Stay (HOSPITAL_COMMUNITY)
Admission: EM | Admit: 2019-04-09 | Discharge: 2019-04-11 | DRG: 433 | Disposition: A | Discharge status: Home / Self Care | Payer: Self-pay | Attending: Family Medicine | Admitting: Family Medicine

## 2019-04-09 ENCOUNTER — Other Ambulatory Visit: Payer: Self-pay

## 2019-04-09 ENCOUNTER — Emergency Department (HOSPITAL_COMMUNITY): Payer: Self-pay

## 2019-04-09 ENCOUNTER — Encounter (HOSPITAL_COMMUNITY): Payer: Self-pay

## 2019-04-09 DIAGNOSIS — Z7141 Alcohol abuse counseling and surveillance of alcoholic: Secondary | ICD-10-CM

## 2019-04-09 DIAGNOSIS — R7989 Other specified abnormal findings of blood chemistry: Secondary | ICD-10-CM | POA: Insufficient documentation

## 2019-04-09 DIAGNOSIS — E876 Hypokalemia: Secondary | ICD-10-CM | POA: Diagnosis present

## 2019-04-09 DIAGNOSIS — R945 Abnormal results of liver function studies: Secondary | ICD-10-CM

## 2019-04-09 DIAGNOSIS — E781 Pure hyperglyceridemia: Secondary | ICD-10-CM | POA: Diagnosis present

## 2019-04-09 DIAGNOSIS — I10 Essential (primary) hypertension: Secondary | ICD-10-CM | POA: Diagnosis present

## 2019-04-09 DIAGNOSIS — Z79899 Other long term (current) drug therapy: Secondary | ICD-10-CM

## 2019-04-09 DIAGNOSIS — Z823 Family history of stroke: Secondary | ICD-10-CM

## 2019-04-09 DIAGNOSIS — E7220 Disorder of urea cycle metabolism, unspecified: Secondary | ICD-10-CM | POA: Diagnosis present

## 2019-04-09 DIAGNOSIS — Z6828 Body mass index (BMI) 28.0-28.9, adult: Secondary | ICD-10-CM

## 2019-04-09 DIAGNOSIS — G479 Sleep disorder, unspecified: Secondary | ICD-10-CM | POA: Diagnosis present

## 2019-04-09 DIAGNOSIS — N63 Unspecified lump in unspecified breast: Secondary | ICD-10-CM | POA: Diagnosis present

## 2019-04-09 DIAGNOSIS — E46 Unspecified protein-calorie malnutrition: Secondary | ICD-10-CM | POA: Diagnosis present

## 2019-04-09 DIAGNOSIS — N179 Acute kidney failure, unspecified: Secondary | ICD-10-CM | POA: Diagnosis present

## 2019-04-09 DIAGNOSIS — Z87891 Personal history of nicotine dependence: Secondary | ICD-10-CM

## 2019-04-09 DIAGNOSIS — Z20828 Contact with and (suspected) exposure to other viral communicable diseases: Secondary | ICD-10-CM | POA: Diagnosis present

## 2019-04-09 DIAGNOSIS — N62 Hypertrophy of breast: Secondary | ICD-10-CM

## 2019-04-09 DIAGNOSIS — K7010 Alcoholic hepatitis without ascites: Principal | ICD-10-CM | POA: Diagnosis present

## 2019-04-09 DIAGNOSIS — E785 Hyperlipidemia, unspecified: Secondary | ICD-10-CM | POA: Diagnosis present

## 2019-04-09 DIAGNOSIS — L989 Disorder of the skin and subcutaneous tissue, unspecified: Secondary | ICD-10-CM | POA: Diagnosis present

## 2019-04-09 DIAGNOSIS — R7303 Prediabetes: Secondary | ICD-10-CM | POA: Diagnosis present

## 2019-04-09 DIAGNOSIS — E440 Moderate protein-calorie malnutrition: Secondary | ICD-10-CM | POA: Diagnosis present

## 2019-04-09 DIAGNOSIS — R251 Tremor, unspecified: Secondary | ICD-10-CM | POA: Insufficient documentation

## 2019-04-09 DIAGNOSIS — Z91018 Allergy to other foods: Secondary | ICD-10-CM

## 2019-04-09 DIAGNOSIS — D696 Thrombocytopenia, unspecified: Secondary | ICD-10-CM | POA: Diagnosis present

## 2019-04-09 DIAGNOSIS — Z8619 Personal history of other infectious and parasitic diseases: Secondary | ICD-10-CM

## 2019-04-09 LAB — BASIC METABOLIC PANEL
Anion gap: 16 — ABNORMAL HIGH (ref 5–15)
BUN: <5 mg/dL — ABNORMAL LOW (ref 6–20)
CO2: 21 mmol/L — ABNORMAL LOW (ref 22–32)
Calcium: 9 mg/dL (ref 8.9–10.3)
Chloride: 97 mmol/L — ABNORMAL LOW (ref 98–111)
Creatinine, Ser: 1.39 mg/dL — ABNORMAL HIGH (ref 0.61–1.24)
GFR calc Af Amer: >60 mL/min (ref >60)
GFR calc non Af Amer: 55 mL/min — ABNORMAL LOW (ref >60)
Glucose, Bld: 124 mg/dL — ABNORMAL HIGH (ref 70–99)
Potassium: 3.8 mmol/L (ref 3.5–5.1)
Sodium: 134 mmol/L — ABNORMAL LOW (ref 135–145)

## 2019-04-09 LAB — URINALYSIS, ROUTINE W REFLEX MICROSCOPIC
Glucose, UA: NEGATIVE mg/dL
Hgb urine dipstick: NEGATIVE
Ketones, ur: NEGATIVE mg/dL
Leukocytes,Ua: NEGATIVE
Nitrite: NEGATIVE
Protein, ur: 100 mg/dL — AB
Specific Gravity, Urine: 1.035 — ABNORMAL HIGH (ref 1.005–1.030)
pH: 5 (ref 5.0–8.0)

## 2019-04-09 LAB — SAVE SMEAR(SSMR), FOR PROVIDER SLIDE REVIEW

## 2019-04-09 LAB — CBC
HCT: 39.7 % (ref 39.0–52.0)
Hemoglobin: 13 g/dL (ref 13.0–17.0)
MCH: 35 pg — ABNORMAL HIGH (ref 26.0–34.0)
MCHC: 32.7 g/dL (ref 30.0–36.0)
MCV: 107 fL — ABNORMAL HIGH (ref 80.0–100.0)
Platelets: DECREASED 10*3/uL (ref 150–400)
RBC: 3.71 MIL/uL — ABNORMAL LOW (ref 4.22–5.81)
RDW: 13.8 % (ref 11.5–15.5)
WBC: 10 10*3/uL (ref 4.0–10.5)
nRBC: 0.5 % — ABNORMAL HIGH (ref 0.0–0.2)

## 2019-04-09 LAB — ACETAMINOPHEN LEVEL: Acetaminophen (Tylenol), Serum: <10 ug/mL — ABNORMAL LOW (ref 10–30)

## 2019-04-09 LAB — RETICULOCYTES
Immature Retic Fract: 23.7 % — ABNORMAL HIGH (ref 2.3–15.9)
RBC.: 3.71 MIL/uL — ABNORMAL LOW (ref 4.22–5.81)
Retic Count, Absolute: 99.4 10*3/uL (ref 19.0–186.0)
Retic Ct Pct: 2.7 % (ref 0.4–3.1)

## 2019-04-09 LAB — ETHANOL: Alcohol, Ethyl (B): <10 mg/dL (ref <10)

## 2019-04-09 LAB — SARS CORONAVIRUS 2 BY RT PCR (HOSPITAL ORDER, PERFORMED IN ~~LOC~~ HOSPITAL LAB): SARS Coronavirus 2: NEGATIVE

## 2019-04-09 LAB — HEPATIC FUNCTION PANEL
ALT: 126 U/L — ABNORMAL HIGH (ref 0–44)
AST: 309 U/L — ABNORMAL HIGH (ref 15–41)
Albumin: 3 g/dL — ABNORMAL LOW (ref 3.5–5.0)
Alkaline Phosphatase: 230 U/L — ABNORMAL HIGH (ref 38–126)
Bilirubin, Direct: 4.2 mg/dL — ABNORMAL HIGH (ref 0.0–0.2)
Indirect Bilirubin: 2.6 mg/dL — ABNORMAL HIGH (ref 0.3–0.9)
Total Bilirubin: 6.8 mg/dL — ABNORMAL HIGH (ref 0.3–1.2)
Total Protein: 6.5 g/dL (ref 6.5–8.1)

## 2019-04-09 LAB — CBG MONITORING, ED
Glucose-Capillary: 115 mg/dL — ABNORMAL HIGH (ref 70–99)
Glucose-Capillary: 130 mg/dL — ABNORMAL HIGH (ref 70–99)

## 2019-04-09 LAB — CK: Total CK: 124 U/L (ref 49–397)

## 2019-04-09 LAB — PROTIME-INR
INR: 1.2 (ref 0.8–1.2)
Prothrombin Time: 14.8 seconds (ref 11.4–15.2)

## 2019-04-09 LAB — AMMONIA: Ammonia: 81 umol/L — ABNORMAL HIGH (ref 9–35)

## 2019-04-09 LAB — MAGNESIUM: Magnesium: 1.5 mg/dL — ABNORMAL LOW (ref 1.7–2.4)

## 2019-04-09 LAB — PHOSPHORUS: Phosphorus: 2.2 mg/dL — ABNORMAL LOW (ref 2.5–4.6)

## 2019-04-09 LAB — PLATELET COUNT: Platelets: 106 10*3/uL — ABNORMAL LOW (ref 150–400)

## 2019-04-09 LAB — LACTATE DEHYDROGENASE: LDH: 269 U/L — ABNORMAL HIGH (ref 98–192)

## 2019-04-09 MED ORDER — SODIUM CHLORIDE 0.9 % IV SOLN
INTRAVENOUS | Status: DC
  Administered 2019-04-09 – 2019-04-10 (×2): via INTRAVENOUS

## 2019-04-09 MED ORDER — VITAMIN B-1 100 MG PO TABS
100.0000 mg | ORAL_TABLET | Freq: Every day | ORAL | Status: DC
  Administered 2019-04-10 – 2019-04-11 (×3): 100 mg via ORAL
  Filled 2019-04-09 (×4): qty 1

## 2019-04-09 MED ORDER — ADULT MULTIVITAMIN W/MINERALS CH
1.0000 | ORAL_TABLET | Freq: Every day | ORAL | Status: DC
  Administered 2019-04-10 – 2019-04-11 (×3): 1 via ORAL
  Filled 2019-04-09 (×3): qty 1

## 2019-04-09 MED ORDER — THIAMINE HCL 100 MG/ML IJ SOLN
100.0000 mg | Freq: Every day | INTRAMUSCULAR | Status: DC
  Filled 2019-04-09: qty 2

## 2019-04-09 MED ORDER — LACTULOSE 10 GM/15ML PO SOLN: 10.0000 g | Freq: Two times a day (BID) | ORAL | Status: DC | PRN

## 2019-04-09 MED ORDER — LACTULOSE 10 GM/15ML PO SOLN
10.0000 g | Freq: Two times a day (BID) | ORAL | Status: DC | PRN
  Administered 2019-04-10: 10 g via ORAL
  Filled 2019-04-09: qty 15

## 2019-04-09 MED ORDER — FOLIC ACID 1 MG PO TABS
1.0000 mg | ORAL_TABLET | Freq: Every day | ORAL | Status: DC
  Administered 2019-04-10 – 2019-04-11 (×3): 1 mg via ORAL
  Filled 2019-04-09 (×3): qty 1

## 2019-04-09 MED ORDER — SODIUM CHLORIDE 0.9 % IV BOLUS
1000.0000 mL | Freq: Once | INTRAVENOUS | Status: AC
  Administered 2019-04-09: 16:00:00 1000 mL via INTRAVENOUS

## 2019-04-09 NOTE — ED Notes (Signed)
ED TO INPATIENT HANDOFF REPORT  ED Nurse Name and Phone #: Annie Main [phone]  S Name/Age/Gender [facility] Micheal Watson 58 y.o. male Room/Bed: H019C/H019C  Code Status   Code Status: Full Code  Home/SNF/Other Home Patient oriented to: self, place, time and situation Is this baseline? Yes   Triage Complete: Triage complete  Chief Complaint TREMORS  Triage Note Per interpreter [phone] pt presents with all over tremors and black spots on his skin, looks like bruises x3 days. Pt with notable tremors. Pt reports he last drank 5 days ago, drinks 2 beers occasionally. Pt also reports urinating blood x4 days.    Allergies Allergies  Allergen Reactions  . Pork-Derived Products Other (See Comments)    "I have pain in my stomach if I eat this"    Level of Care/Admitting Diagnosis ED Disposition    ED Disposition Condition [place]: [place] [[id number]]  Level of Care: Med-Surg [16]  Covid Evaluation: Confirmed COVID Negative  Diagnosis: Hyperammonemia [facility]) [id number]  Admitting Physician: Kathrene Alu [id number]  Attending Physician: Madison Hickman A [[id number]]  Estimated length of stay: past midnight tomorrow  Certification:: I certify this patient will need inpatient services for at least 2 midnights  PT Class (Do Not Modify): Inpatient [101]  PT Acc Code (Do Not Modify): Private [1]       B Medical/Surgery History History reviewed. No pertinent past medical history. History reviewed. No pertinent surgical history.   A IV Location/Drains/Wounds Patient Lines/Drains/Airways Status   Active Line/Drains/Airways    Name:   Placement date:   Placement time:   Site:   Days:   Peripheral IV 04/09/19 Right Antecubital   04/09/19    1300    Antecubital   less than 1          Intake/Output Last 24 hours  Intake/Output Summary (Last 24 hours) at 04/09/2019 2354 Last data filed at 04/09/2019 1738 Gross per 24 hour  Intake 1000 ml  Output  -  Net 1000 ml    Labs/Imaging Results for orders placed or performed during the hospital encounter of 04/09/19 (from the past 48 hour(s))  Basic metabolic panel     Status: Abnormal   Collection Time: 04/09/19  1:17 PM  Result Value Ref Range   Sodium 134 (L) 135 - 145 mmol/L   Potassium 3.8 3.5 - 5.1 mmol/L   Chloride 97 (L) 98 - 111 mmol/L   CO2 21 (L) 22 - 32 mmol/L   Glucose, Bld 124 (H) 70 - 99 mg/dL   BUN <5 (L) 6 - 20 mg/dL   Creatinine, Ser 1.39 (H) 0.61 - 1.24 mg/dL   Calcium 9.0 8.9 - 10.3 mg/dL   GFR calc non Af Amer 55 (L) >60 mL/min   GFR calc Af Amer >60 >60 mL/min   Anion gap 16 (H) 5 - 15    Comment: Performed at [facility], [address], [place], [place] [address]  CBC     Status: Abnormal   Collection Time: 04/09/19  1:17 PM  Result Value Ref Range   WBC 10.0 4.0 - 10.5 K/uL   RBC 3.71 (L) 4.22 - 5.81 MIL/uL   Hemoglobin 13.0 13.0 - 17.0 g/dL   HCT 39.7 39.0 - 52.0 %   MCV 107.0 (H) 80.0 - 100.0 fL   MCH 35.0 (H) 26.0 - 34.0 pg   MCHC 32.7 30.0 - 36.0 g/dL   RDW 13.8 11.5 - 15.5 %  Platelets  150 - 400 K/uL    PLATELET CLUMPS NOTED ON SMEAR, COUNT APPEARS DECREASED    Comment: Immature Platelet Fraction may be clinically indicated, consider ordering this additional test [id number]    nRBC 0.5 (H) 0.0 - 0.2 %    Comment: Performed at [facility] [address] [place], [place] [address]  POC CBG, ED     Status: Abnormal   Collection Time: 04/09/19  1:19 PM  Result Value Ref Range   Glucose-Capillary 115 (H) 70 - 99 mg/dL  Magnesium     Status: Abnormal   Collection Time: 04/09/19  3:25 PM  Result Value Ref Range   Magnesium 1.5 (L) 1.7 - 2.4 mg/dL    Comment: Performed at [facility] [address], [place], [place] [address]  Phosphorus     Status: Abnormal   Collection Time: 04/09/19  3:25 PM  Result Value Ref Range   Phosphorus 2.2 (L) 2.5 - 4.6 mg/dL    Comment: Performed at [facility] [address],  [place], [place] [address]  Hepatic function panel     Status: Abnormal   Collection Time: 04/09/19  3:25 PM  Result Value Ref Range   Total Protein 6.5 6.5 - 8.1 g/dL   Albumin 3.0 (L) 3.5 - 5.0 g/dL   AST 309 (H) 15 - 41 U/L   ALT 126 (H) 0 - 44 U/L   Alkaline Phosphatase 230 (H) 38 - 126 U/L   Total Bilirubin 6.8 (H) 0.3 - 1.2 mg/dL   Bilirubin, Direct 4.2 (H) 0.0 - 0.2 mg/dL   Indirect Bilirubin 2.6 (H) 0.3 - 0.9 mg/dL    Comment: Performed at [facility] [address] [place], [place] [address]  CK     Status: None   Collection Time: 04/09/19  3:25 PM  Result Value Ref Range   Total CK 124 49 - 397 U/L    Comment: Performed at [facility], [place] [address], [place], [place] [address]  Lactate dehydrogenase     Status: Abnormal   Collection Time: 04/09/19  3:25 PM  Result Value Ref Range   LDH 269 (H) 98 - 192 U/L    Comment: Performed at [facility], [place] [address], [place], [place] [address]  CBG monitoring, ED     Status: Abnormal   Collection Time: 04/09/19  3:30 PM  Result Value Ref Range   Glucose-Capillary 130 (H) 70 - 99 mg/dL  Urinalysis, Routine w reflex microscopic- may I&O cath if menses     Status: Abnormal   Collection Time: 04/09/19  3:41 PM  Result Value Ref Range   Color, Urine AMBER (A) YELLOW   APPearance TURBID (A) CLEAR   Specific Gravity, Urine 1.035 (H) 1.005 - 1.030   pH 5.0 5.0 - 8.0   Glucose, UA NEGATIVE NEGATIVE mg/dL   Hgb urine dipstick NEGATIVE NEGATIVE   Bilirubin Urine MODERATE (A) NEGATIVE   Ketones, ur NEGATIVE NEGATIVE mg/dL   Protein, ur 100 (A) NEGATIVE mg/dL   Nitrite NEGATIVE NEGATIVE   Leukocytes,Ua NEGATIVE NEGATIVE   Bacteria, UA RARE (A) NONE SEEN    Comment: Performed at [facility] [address], [place], [place] [address]  Ethanol     Status: None   Collection Time: 04/09/19  4:18 PM  Result Value Ref Range   Alcohol, Ethyl (B) <10 <10 mg/dL    Comment: (NOTE) Lowest detectable limit for  serum  alcohol is 10 mg/dL. For medical purposes only. Performed at [facility], [place] [address], [place], [place] [address]   Ammonia     Status: Abnormal   Collection Time: 04/09/19  4:23 PM  Result Value Ref Range   Ammonia 81 (H) 9 - 35 umol/L    Comment: Performed at [facility] [address], [place], [place] [address]  Platelet count     Status: Abnormal   Collection Time: 04/09/19  4:35 PM  Result Value Ref Range   Platelets 106 (L) 150 - 400 K/uL    Comment: PLATELET CLUMPS NOTED ON SMEAR, COUNT APPEARS DECREASED REPEATED TO VERIFY Performed at [facility] [address] [place], [place] [address]   SARS Coronavirus 2 [facility] order, Performed in [facility] hospital lab) Nasopharyngeal Nasopharyngeal Swab     Status: None   Collection Time: 04/09/19  4:35 PM   Specimen: Nasopharyngeal Swab  Result Value Ref Range   SARS Coronavirus 2 NEGATIVE NEGATIVE    Comment: (NOTE) If result is NEGATIVE SARS-CoV-2 target nucleic acids are NOT DETECTED. The SARS-CoV-2 RNA is generally detectable in upper and lower  respiratory specimens during the acute phase of infection. The lowest  concentration of SARS-CoV-2 viral copies this assay can detect is 250  copies / mL. A negative result does not preclude SARS-CoV-2 infection  and should not be used as the sole basis for treatment or other  patient management decisions.  A negative result may occur with  improper specimen collection / handling, submission of specimen other  than nasopharyngeal swab, presence of viral mutation(s) within the  areas targeted by this assay, and inadequate number of viral copies  (<250 copies / mL). A negative result must be combined with clinical  observations, patient history, and epidemiological information. If result is POSITIVE SARS-CoV-2 target nucleic acids are DETECTED. The SARS-CoV-2 RNA is generally detectable in upper and lower  respiratory specimens dur ing the acute phase  of infection.  Positive  results are indicative of active infection with SARS-CoV-2.  Clinical  correlation with patient history and other diagnostic information is  necessary to determine patient infection status.  Positive results do  not rule out bacterial infection or co-infection with other viruses. If result is PRESUMPTIVE POSTIVE SARS-CoV-2 nucleic acids MAY BE PRESENT.   A presumptive positive result was obtained on the submitted specimen  and confirmed on repeat testing.  While 2019 novel coronavirus  (SARS-CoV-2) nucleic acids may be present in the submitted sample  additional confirmatory testing may be necessary for epidemiological  and / or clinical management purposes  to differentiate between  SARS-CoV-2 and other Sarbecovirus currently known to infect humans.  If clinically indicated additional testing with an alternate test  methodology [phone]) is advised. The SARS-CoV-2 RNA is generally  detectable in upper and lower respiratory sp ecimens during the acute  phase of infection. The expected result is Negative. Fact Sheet for Patients:  StrictlyIdeas.no Fact Sheet for Healthcare Providers: BankingDealers.co.za This test is not yet approved or cleared by the [place] FDA and has been authorized for detection and/or diagnosis of SARS-CoV-2 by FDA under an Emergency Use Authorization (EUA).  This EUA will remain in effect (meaning this test can be used) for the duration of the COVID-19 declaration under Section 564(b)(1) of the Act, 21 U.S.C. section 360bbb-3(b)(1), unless the authorization is terminated or revoked sooner. Performed at [facility], [place] [address] [place], [place]  [id number]   Acetaminophen level     Status: Abnormal   Collection Time: 04/09/19  4:56 PM  Result Value Ref Range   Acetaminophen (Tylenol), Serum <10 (L) 10 - 30 ug/mL    Comment: (NOTE) Therapeutic concentrations vary  significantly. A range of 10-30 ug/mL  may be an effective concentration for many patients. However, some  are best treated at concentrations outside of this range. Acetaminophen concentrations >150 ug/mL at 4 hours after ingestion  and >50 ug/mL at 12 hours after ingestion are often associated with  toxic reactions. Performed at [facility], [place] [address], [place], [place] [address]   Protime-INR     Status: None   Collection Time: 04/09/19  5:30 PM  Result Value Ref Range   Prothrombin Time 14.8 11.4 - 15.2 seconds   INR 1.2 0.8 - 1.2    Comment: (NOTE) INR goal varies based on device and disease states. Performed at [facility], [place] [address] [place], [place] [address]   Reticulocytes     Status: Abnormal   Collection Time: 04/09/19  5:37 PM  Result Value Ref Range   Retic Ct Pct 2.7 0.4 - 3.1 %   RBC. 3.71 (L) 4.22 - 5.81 MIL/uL   Retic Count, Absolute 99.4 19.0 - 186.0 K/uL   Immature Retic Fract 23.7 (H) 2.3 - 15.9 %    Comment: Performed at [facility] [address] [place], [place] [address]  Save Smear     Status: None   Collection Time: 04/09/19  5:37 PM  Result Value Ref Range   Smear Review SMEAR STAINED AND AVAILABLE FOR REVIEW     Comment: Performed at [facility], [place] [address], [place], [place] [address]   Ct Abdomen Pelvis Wo Contrast  Addendum Date: 04/09/2019   ADDENDUM REPORT: 04/09/2019 17:32 ADDENDUM: The patient has bilateral gynecomastia, left greater than right. This was discussed with Sophia Caccavale, PA-C. Electronically Signed   By: Claudie Revering M.D.   On: 04/09/2019 17:32   Result Date: 04/09/2019 CLINICAL DATA:  Diffuse tremors and black spots on his skin, possibly bruises, for the past 3 days. Gross hematuria for the past 4 days. EXAM: CT CHEST, ABDOMEN AND PELVIS WITHOUT CONTRAST TECHNIQUE: Multidetector CT imaging of the chest, abdomen and pelvis was performed following the standard protocol without IV contrast.  COMPARISON:  Portable chest dated 11/17/2018. FINDINGS: CT CHEST FINDINGS Cardiovascular: No significant vascular findings. Normal heart size. No pericardial effusion. Mediastinum/Nodes: No enlarged mediastinal, hilar, or axillary lymph nodes. Thyroid gland, trachea, and esophagus demonstrate no significant findings. Lungs/Pleura: Right lung calcified granulomata. No airspace consolidation or pleural fluid Musculoskeletal: Thoracic spine degenerative changes. CT ABDOMEN PELVIS FINDINGS Hepatobiliary: Diffusely enlarged liver with diffuse low density relative to the spleen. Medium density material filling an otherwise normal appearing gallbladder. Pancreas: Unremarkable. No pancreatic ductal dilatation or surrounding inflammatory changes. Spleen: Normal in size without focal abnormality. Adrenals/Urinary Tract: Adrenal glands are unremarkable. Kidneys are normal, without renal calculi, focal lesion, or hydronephrosis. Bladder is unremarkable. Stomach/Bowel: Stomach is within normal limits. Appendix appears normal. No evidence of bowel wall thickening, distention, or inflammatory changes. Vascular/Lymphatic: No significant vascular findings are present. No enlarged abdominal or pelvic lymph nodes. Reproductive: Mildly enlarged prostate gland. Other: No abdominal wall hernia or abnormality. No abdominopelvic ascites. Musculoskeletal: Lumbar spine degenerative changes. Small, rounded lucent lesion with a surrounding sclerotic rim in the medial femoral head on the left and small cystic area or  synovial pit in the lateral femoral head and neck junction. IMPRESSION: 1. No acute abnormality in the chest, abdomen or pelvis. 2. Hepatomegaly with diffuse hepatic steatosis. 3. Medium density material filling an otherwise normal appearing gallbladder. This is compatible with sludge filling the gallbladder. 4. Mildly prostatic hypertrophy. Electronically Signed: By: Claudie Revering M.D. On: 04/09/2019 16:08   Ct Chest Wo  Contrast  Addendum Date: 04/09/2019   ADDENDUM REPORT: 04/09/2019 17:32 ADDENDUM: The patient has bilateral gynecomastia, left greater than right. This was discussed with Sophia Caccavale, PA-C. Electronically Signed   By: Claudie Revering M.D.   On: 04/09/2019 17:32   Result Date: 04/09/2019 CLINICAL DATA:  Diffuse tremors and black spots on his skin, possibly bruises, for the past 3 days. Gross hematuria for the past 4 days. EXAM: CT CHEST, ABDOMEN AND PELVIS WITHOUT CONTRAST TECHNIQUE: Multidetector CT imaging of the chest, abdomen and pelvis was performed following the standard protocol without IV contrast. COMPARISON:  Portable chest dated 11/17/2018. FINDINGS: CT CHEST FINDINGS Cardiovascular: No significant vascular findings. Normal heart size. No pericardial effusion. Mediastinum/Nodes: No enlarged mediastinal, hilar, or axillary lymph nodes. Thyroid gland, trachea, and esophagus demonstrate no significant findings. Lungs/Pleura: Right lung calcified granulomata. No airspace consolidation or pleural fluid Musculoskeletal: Thoracic spine degenerative changes. CT ABDOMEN PELVIS FINDINGS Hepatobiliary: Diffusely enlarged liver with diffuse low density relative to the spleen. Medium density material filling an otherwise normal appearing gallbladder. Pancreas: Unremarkable. No pancreatic ductal dilatation or surrounding inflammatory changes. Spleen: Normal in size without focal abnormality. Adrenals/Urinary Tract: Adrenal glands are unremarkable. Kidneys are normal, without renal calculi, focal lesion, or hydronephrosis. Bladder is unremarkable. Stomach/Bowel: Stomach is within normal limits. Appendix appears normal. No evidence of bowel wall thickening, distention, or inflammatory changes. Vascular/Lymphatic: No significant vascular findings are present. No enlarged abdominal or pelvic lymph nodes. Reproductive: Mildly enlarged prostate gland. Other: No abdominal wall hernia or abnormality. No abdominopelvic  ascites. Musculoskeletal: Lumbar spine degenerative changes. Small, rounded lucent lesion with a surrounding sclerotic rim in the medial femoral head on the left and small cystic area or synovial pit in the lateral femoral head and neck junction. IMPRESSION: 1. No acute abnormality in the chest, abdomen or pelvis. 2. Hepatomegaly with diffuse hepatic steatosis. 3. Medium density material filling an otherwise normal appearing gallbladder. This is compatible with sludge filling the gallbladder. 4. Mildly prostatic hypertrophy. Electronically Signed: By: Claudie Revering M.D. On: 04/09/2019 16:08   US Abdomen Limited Ruq  Result Date: 04/09/2019 CLINICAL DATA:  Elevated liver function tests and bilirubin. EXAM: ULTRASOUND ABDOMEN LIMITED RIGHT UPPER QUADRANT COMPARISON:  Chest, abdomen and pelvis CT obtained earlier today. FINDINGS: Gallbladder: No gallstones or wall thickening visualized. No sonographic Murphy sign noted by sonographer. Thin internal septation in the gallbladder fundus or minimal amount of pericholecystic fluid or wall edema. On the CT earlier today, this has an appearance compatible with a thin internal septation with no wall edema or pericholecystic fluid seen. Common bile duct: Diameter: 3.6 mm. Liver: Diffusely echogenic. Corresponding marked diffuse low density on the previous CT. Portal vein is patent on color Doppler imaging with normal direction of blood flow towards the liver. Other: None. IMPRESSION: 1. No acute abnormality. 2. Marked diffuse hepatic steatosis. 3. Thin internal septation in the gallbladder fundus. Electronically Signed   By: Claudie Revering M.D.   On: 04/09/2019 19:32    Pending Labs Unresulted Labs (From admission, onward)    Start     Ordered   04/10/19 0500  Comprehensive metabolic  panel  Tomorrow morning,   R     04/09/19 1848   04/10/19 0500  CBC  Tomorrow morning,   R     04/09/19 1848   04/09/19 1908  Vitamin B12  Once,   STAT     04/09/19 1908   04/09/19 1908   Folate RBC  Once,   STAT     04/09/19 1908   04/09/19 1905  Ferritin  Once,   STAT     04/09/19 1904   04/09/19 1847  HIV antibody (Routine Testing)  Once,   STAT     04/09/19 1848   04/09/19 1723  Pathologist smear review  Once,   STAT     04/09/19 1723   04/09/19 1657  Hepatitis panel, acute  ONCE - STAT,   STAT     04/09/19 1657   04/09/19 1454  Haptoglobin  Once,   STAT     04/09/19 1455   04/09/19 1308  Urine C&S  Once,   STAT     04/09/19 1307          Vitals/Pain Today's Vitals   04/09/19 1905 04/09/19 2216 04/09/19 2217 04/09/19 2230  BP: (!) 158/102 (!) 136/96  (!) 142/83  Pulse: 81  82   Resp: (!) 24  20 (!) 22  Temp:      TempSrc:      SpO2: 96%  95%   Weight:      Height:      PainSc:        Isolation Precautions No active isolations  Medications Medications  0.9 %  sodium chloride infusion ( Intravenous New Bag/Given 04/09/19 2255)  thiamine (VITAMIN B-1) tablet 100 mg (has no administration in time range)    Or  thiamine (B-1) injection 100 mg (has no administration in time range)  folic acid (FOLVITE) tablet 1 mg (has no administration in time range)  multivitamin with minerals tablet 1 tablet (has no administration in time range)  lactulose (CHRONULAC) 10 GM/15ML solution 10 g (has no administration in time range)  sodium chloride 0.9 % bolus 1,000 mL (0 mLs Intravenous Stopped 04/09/19 1738)    Mobility walks with person assist Low fall risk   Focused Assessments    R Recommendations: See Admitting Provider Note  Report given to:   Additional Notes:

## 2019-04-09 NOTE — ED Notes (Signed)
Patient transported to Ultrasound 

## 2019-04-09 NOTE — ED Notes (Signed)
Patient reports unintentional weight loss, loss of appetite, last vomit was 3 days ago, denies diarrhea, denies fever.

## 2019-04-09 NOTE — Consult Note (Signed)
Referring Provider:  Dr. Madison Hickman Primary Care Physician:  Patient, No Pcp Per Primary Gastroenterologist: None unassigned)  Reason for Consultation: Elevated liver chemistries  (History obtained with the assistance of a video interpreting service)  HPI: Micheal Watson is a 58 y.o. male being admitted through the emergency room with a 4-day history of malaise and tremor, also dark urine and easy bruising.  The patient is 4 months status post a 10-day hospitalization for COVID infection.  He admits to drinking 4 beers about twice a week, no liquor.  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  However, his appetite has been poor and he thinks he has lost a fair amount of weight.  He has also had some nausea, one episode of vomiting a week ago, no abdominal pain.  In the emergency room, it was noted that his liver chemistries were significantly elevated, with bilirubin 6.8, AST 309, ALT 126, alk phos 230.  Platelets were clumped, INR normal.  For comparison, back in April, his AST was 49 and his ALT was 84, with a bilirubin of 0.8.  A CT of the abdomen performed in the emergency room showed hepatomegaly with steatosis, also gynecomastia, but no splenomegaly, varices, or ascites.   History reviewed. No pertinent past medical history.  History reviewed. No pertinent surgical history.  Prior to Admission medications   Medication Sig Start Date End Date Taking? Authorizing Provider  enoxaparin (LOVENOX) 40 MG/0.4ML injection Inject 0.4 mLs (40 mg total) into the skin daily. Patient not taking: Reported on 04/09/2019 11/27/18   Elgergawy, Silver Huguenin, MD  pantoprazole (PROTONIX) 40 MG tablet Take 1 tablet (40 mg total) by mouth daily. Patient not taking: Reported on 04/09/2019 11/27/18 11/27/19  Elgergawy, Silver Huguenin, MD  polyvinyl alcohol (LIQUIFILM TEARS) 1.4 % ophthalmic  solution Place 1 drop into both eyes as needed for dry eyes (allergies).    [provider]    No current facility-administered medications for this encounter.    Current Outpatient Medications  Medication Sig Dispense Refill  . enoxaparin (LOVENOX) 40 MG/0.4ML injection Inject 0.4 mLs (40 mg total) into the skin daily. (Patient not taking: Reported on 04/09/2019) 10 Syringe 0  . pantoprazole (PROTONIX) 40 MG tablet Take 1 tablet (40 mg total) by mouth daily. (Patient not taking: Reported on 04/09/2019) 30 tablet 0  . polyvinyl alcohol (LIQUIFILM TEARS) 1.4 % ophthalmic solution Place 1 drop into both eyes as needed for dry eyes (allergies).      Allergies as of 04/09/2019 - Review Complete 04/09/2019  Allergen Reaction Noted  . Pork-derived products Other (See Comments) 04/09/2019    Family History  Problem Relation Age of Onset  . CVA Father     Social History   Socioeconomic History  . Marital status: Married    Spouse name: Not on file  . Number of children: Not on file  . Years of education: Not on file  . Highest education level: Not on file  Occupational History  . Not on file  Social Needs  . Financial resource strain: Not on file  . Food insecurity    Worry: Not on file    Inability: Not on file  . Transportation needs    Medical: Patient refused    Non-medical: Patient refused  Tobacco Use  . Smoking status: Current Every Day Smoker    Packs/day: 0.30    Types:  Cigarettes  . Smokeless tobacco: Never Used  Substance and Sexual Activity  . Alcohol use: Yes    Alcohol/week: 2.0 standard drinks    Types: 2 Cans of beer per week  . Drug use: Not on file  . Sexual activity: Not on file  Lifestyle  . Physical activity    Days per week: Not on file    Minutes per session: Not on file  . Stress: Not on file  Relationships  . Social Herbalist on phone: Not on file    Gets together: Not on file    Attends religious service: Not on file     Active member of club or organization: Not on file    Attends meetings of clubs or organizations: Not on file    Relationship status: Not on file  . Intimate partner violence    Fear of current or ex partner: Not on file    Emotionally abused: Not on file    Physically abused: Not on file    Forced sexual activity: Not on file  Other Topics Concern  . Not on file  Social History Narrative  . Not on file    Review of Systems: Poor appetite and weight loss per HPI, also tremulousness, recent easy bruising, small nummular patches on his forearms, dark urine; no joint swelling, no chest pain or shortness of breath  Physical Exam: Vital signs in last 24 hours: Temp:  [98.5 F (36.9 C)] 98.5 F (36.9 C) (09/04 1231) Pulse Rate:  [105] 105 (09/04 1231) Resp:  [20-23] 23 (09/04 1415) BP: (148)/(100-115) 148/115 (09/04 1415) SpO2:  [100 %] 100 % (09/04 1231) Weight:  [87.1 kg] 87.1 kg (09/04 1305)   General:   Alert,  Well-developed, well-nourished, pleasant and cooperative in NAD Head:  Normocephalic and atraumatic. Eyes: Perhaps slight scleral icterus, nothing impressive Lungs:  Clear throughout to auscultation.   No wheezes, crackles, or rhonchi. No evident respiratory distress. Heart:   Regular rate and rhythm; no murmurs, clicks, rubs,  or gallops. Abdomen:  Soft, nontender, slightly adipose, but nondistended. No masses, hepatosplenomegaly or ventral hernias noted.  No evident ascites (flank tympany present).    Msk:   Symmetrical without gross deformities. Extremities:   Without cyanosis, or edema.  Questionable clubbing of fingertips. Neurologic:  Alert and coherent; somewhat tremulous (especially intention tremor) but no evident focal neurologic deficits. Skin: Well perfused.  1 cm diameter smooth lesions on his forearms.  Also, bruising, for example in the antecubital fossae but no large ecchymoses noted. Psych:   Alert and cooperative. Normal mood and affect.  Intake/Output  from previous day: No intake/output data recorded. Intake/Output this shift: Total I/O In: 1000 [IV Piggyback:1000] Out: -   Lab Results: Recent Labs    04/09/19 1317 04/09/19 1635  WBC 10.0  --   HGB 13.0  --   HCT 39.7  --   PLT PLATELET CLUMPS NOTED ON SMEAR, COUNT APPEARS DECREASED 106*   BMET Recent Labs    04/09/19 1317  NA 134*  K 3.8  CL 97*  CO2 21*  GLUCOSE 124*  BUN <5*  CREATININE 1.39*  CALCIUM 9.0   LFT Recent Labs    04/09/19 1525  PROT 6.5  ALBUMIN 3.0*  AST 309*  ALT 126*  ALKPHOS 230*  BILITOT 6.8*  BILIDIR 4.2*  IBILI 2.6*   PT/INR Recent Labs    04/09/19 1730  LABPROT 14.8  INR 1.2    Studies/Results: Ct Abdomen  Pelvis Wo Contrast  Addendum Date: 04/09/2019   ADDENDUM REPORT: 04/09/2019 17:32 ADDENDUM: The patient has bilateral gynecomastia, left greater than right. This was discussed with Sophia Caccavale, PA-C. Electronically Signed   By: Claudie Revering M.D.   On: 04/09/2019 17:32   Result Date: 04/09/2019 CLINICAL DATA:  Diffuse tremors and black spots on his skin, possibly bruises, for the past 3 days. Gross hematuria for the past 4 days. EXAM: CT CHEST, ABDOMEN AND PELVIS WITHOUT CONTRAST TECHNIQUE: Multidetector CT imaging of the chest, abdomen and pelvis was performed following the standard protocol without IV contrast. COMPARISON:  Portable chest dated 11/17/2018. FINDINGS: CT CHEST FINDINGS Cardiovascular: No significant vascular findings. Normal heart size. No pericardial effusion. Mediastinum/Nodes: No enlarged mediastinal, hilar, or axillary lymph nodes. Thyroid gland, trachea, and esophagus demonstrate no significant findings. Lungs/Pleura: Right lung calcified granulomata. No airspace consolidation or pleural fluid Musculoskeletal: Thoracic spine degenerative changes. CT ABDOMEN PELVIS FINDINGS Hepatobiliary: Diffusely enlarged liver with diffuse low density relative to the spleen. Medium density material filling an otherwise  normal appearing gallbladder. Pancreas: Unremarkable. No pancreatic ductal dilatation or surrounding inflammatory changes. Spleen: Normal in size without focal abnormality. Adrenals/Urinary Tract: Adrenal glands are unremarkable. Kidneys are normal, without renal calculi, focal lesion, or hydronephrosis. Bladder is unremarkable. Stomach/Bowel: Stomach is within normal limits. Appendix appears normal. No evidence of bowel wall thickening, distention, or inflammatory changes. Vascular/Lymphatic: No significant vascular findings are present. No enlarged abdominal or pelvic lymph nodes. Reproductive: Mildly enlarged prostate gland. Other: No abdominal wall hernia or abnormality. No abdominopelvic ascites. Musculoskeletal: Lumbar spine degenerative changes. Small, rounded lucent lesion with a surrounding sclerotic rim in the medial femoral head on the left and small cystic area or synovial pit in the lateral femoral head and neck junction. IMPRESSION: 1. No acute abnormality in the chest, abdomen or pelvis. 2. Hepatomegaly with diffuse hepatic steatosis. 3. Medium density material filling an otherwise normal appearing gallbladder. This is compatible with sludge filling the gallbladder. 4. Mildly prostatic hypertrophy. Electronically Signed: By: Claudie Revering M.D. On: 04/09/2019 16:08   Ct Chest Wo Contrast  Addendum Date: 04/09/2019   ADDENDUM REPORT: 04/09/2019 17:32 ADDENDUM: The patient has bilateral gynecomastia, left greater than right. This was discussed with Sophia Caccavale, PA-C. Electronically Signed   By: Claudie Revering M.D.   On: 04/09/2019 17:32   Result Date: 04/09/2019 CLINICAL DATA:  Diffuse tremors and black spots on his skin, possibly bruises, for the past 3 days. Gross hematuria for the past 4 days. EXAM: CT CHEST, ABDOMEN AND PELVIS WITHOUT CONTRAST TECHNIQUE: Multidetector CT imaging of the chest, abdomen and pelvis was performed following the standard protocol without IV contrast. COMPARISON:   Portable chest dated 11/17/2018. FINDINGS: CT CHEST FINDINGS Cardiovascular: No significant vascular findings. Normal heart size. No pericardial effusion. Mediastinum/Nodes: No enlarged mediastinal, hilar, or axillary lymph nodes. Thyroid gland, trachea, and esophagus demonstrate no significant findings. Lungs/Pleura: Right lung calcified granulomata. No airspace consolidation or pleural fluid Musculoskeletal: Thoracic spine degenerative changes. CT ABDOMEN PELVIS FINDINGS Hepatobiliary: Diffusely enlarged liver with diffuse low density relative to the spleen. Medium density material filling an otherwise normal appearing gallbladder. Pancreas: Unremarkable. No pancreatic ductal dilatation or surrounding inflammatory changes. Spleen: Normal in size without focal abnormality. Adrenals/Urinary Tract: Adrenal glands are unremarkable. Kidneys are normal, without renal calculi, focal lesion, or hydronephrosis. Bladder is unremarkable. Stomach/Bowel: Stomach is within normal limits. Appendix appears normal. No evidence of bowel wall thickening, distention, or inflammatory changes. Vascular/Lymphatic: No significant vascular findings are present. No  enlarged abdominal or pelvic lymph nodes. Reproductive: Mildly enlarged prostate gland. Other: No abdominal wall hernia or abnormality. No abdominopelvic ascites. Musculoskeletal: Lumbar spine degenerative changes. Small, rounded lucent lesion with a surrounding sclerotic rim in the medial femoral head on the left and small cystic area or synovial pit in the lateral femoral head and neck junction. IMPRESSION: 1. No acute abnormality in the chest, abdomen or pelvis. 2. Hepatomegaly with diffuse hepatic steatosis. 3. Medium density material filling an otherwise normal appearing gallbladder. This is compatible with sludge filling the gallbladder. 4. Mildly prostatic hypertrophy. Electronically Signed: By: Claudie Revering M.D. On: 04/09/2019 16:08    Impression: 1.  Jaundice with  elevated liver chemistries and a mixed hepatocellular and cholestatic pattern with OT/PT split suggestive of alcoholic hepatitis.  Supporting this is the presence of gynecomastia and hepatomegaly with steatosis on CT scanning, as well as the absence of pain symptoms or alternative etiologies for jaundice such as biliary obstruction or infiltrative disease of the liver.  Although the patient does not provide a history suggestive of excessive ethanol consumption, it is possible that he is understating his actual consumption.  Plan: Monitor liver chemistries and watch for alcohol withdrawal.  Keep in mind that, with alcohol abstinence, there is sometimes a transient rise in bilirubin.   LOS: 0 days   Youlanda Mighty Conroy Goracke  04/09/2019, 6:58 PM   Pager [phone] If no answer or after 5 PM call [phone]

## 2019-04-09 NOTE — ED Triage Notes (Signed)
Per interpreter [phone] pt presents with all over tremors and black spots on his skin, looks like bruises x3 days. Pt with notable tremors. Pt reports he last drank 5 days ago, drinks 2 beers occasionally. Pt also reports urinating blood x4 days.

## 2019-04-09 NOTE — H&P (Addendum)
[facility] Admission History and Physical Service Pager: [phone]  Patient name: Micheal Watson Medical record number: [id number] Date of birth: October 06, 1960 Age: 58 y.o. Gender: male  Primary Care Provider: Patient, No Pcp Per Consultants: GI Code Status: FULL Emergency contact: Jose, patient's boss, at [phone]  Chief Complaint: tremor, loss of appetite, ecchymoses  Assessment and Plan: Micheal Watson is a 58 y.o. male presenting with tremor, ecchymoses, jaundice, and loss of appetite. Patient denies any PMH.  Tremor, ecchymoses, jaundice: Patient presents with 4 days of tremors, ecchymoses, jaundice, and lack of appetite.  The findings of icteric sclerae, easy bruising, gynecomastia, hepatomegaly, asterixis in the setting of lab values with a mixed picture of both hepatocellular (AST 309, ALT 126 -2:1 ratio-, INR 1, albumin 3)  and cholestatic injury (bilirubin is 6.8, alk phos 230) as well as elevated ammonia suggest hepatitis secondary to ethanol use disorder. CT of the abdomen performed in the ED confirms hepatomegaly well as steatosis and gynecomastia, but no splenomegaly varices or ascites.  Platelets were clumped on the first test, repeat shows 106,000.  INR is normal.  Patient denies significant alcohol history, UDS does not show ethanol, he may be understating this.  He says he does not drink hard liquor or wine, but states that he drinks "a couple of" beers after work.  He says this is not daily but 2-3 times per week.  Ethanol level is negative.  For the aforementioned reasons other diagnoses are further down on her differential, but initially considered TTP, though his plasmic score of 3 argues against this diagnosis.  Other causes hemolysis, such as microangiopathic disorders, can also be considered, although this would not explain his liver dysfunction.  Other considerations were for obstructive diseases of the liver and gallbladder, such  as a tumor at the head of the pancreas, choledocholithiasis, etc.  However lack of pain in his right upper quadrant, a right upper quadrant ultrasound, and a CT abdomen rule out these possibilities.  GI is following and we appreciate recommendations. -Admit to [facility], attending Dr. Andria Frames -Start lactulose and titrate to 3 bowel movements per day - AM labs: CBC, CMP -Additional labs: ferritin, folate, HIV, Vit B12, hepatitis panel - start thiamine - start folic acid - start MVI - NS @ mIVF  -CIWA protocol without ativan (nurse must page for ativan)  AKI CBC reveals an elevated creatinine today to 1.39.  Per chart review patient's cr baseline from April of this year is between 0.8 and 0.9.  Etiology of AKI unclear at this time, likely due to prerenal causes with decreased oral intake recently. S/p 1 L NS bolus in the ED. -Normal saline _0    Lesion on skin suspicious for cutaneous carcinoma A lesion has been present for more than a month on his right anterior shoulder that has asymmetric borders, variegated colors, not ulcerated, about 1 cm in size.  It does not appear to be a seborrheic keratosis, or a keratoma acanthosis, recommend biopsy.  Can be done in the outpatient setting.    FEN/GI: regular diet Prophylaxis: SCDs  Disposition: to home on resolution of tremor, jaundice, work up of megaly and easy bruising  History of Present Illness:  Micheal Watson is a 58 y.o. male presenting with a tremor, lack of appetite that started four days ago.  He also endorses subjective fever and chills without any measured temperatures.  He says that he has felt more confused recently as well.  He has had some nausea and  vomiting but denies diarrhea.  He has had some chest tightness.  He has been concerned by frequent bruising that has occurred throughout his body.  He notes that his urine has been a very dark yellow.  His bowel movements have been.  His bowel movements have been normal in  appearance.  He denies changes in his gait or joint swelling.  He denies any past medical problems and says that he has not seen a doctor other than his visit to the hospital for COVID infection in April 2020.  He endorses a history of tobacco use but quit six months ago.  He denies taking any medications and does not know of any family history.  He says that his symptoms.  He reports drinking about two beers per week.  He denies daily alcohol and denies using any other drugs.  He works in [employer].  Review Of Systems: Per HPI with the following additions:   Review of Systems  Constitutional: Positive for chills and fever.  HENT: Negative for congestion and sore throat.   Respiratory: Positive for shortness of breath. Negative for cough.   Cardiovascular: Positive for chest pain.  Gastrointestinal: Positive for nausea and vomiting. Negative for abdominal pain and diarrhea.  Genitourinary: Negative for frequency, hematuria and urgency.  Musculoskeletal: Negative for joint pain.  Neurological: Positive for tremors.       Confusion  Endo/Heme/Allergies: Bruises/bleeds easily.    Patient Active Problem List   Diagnosis Date Noted  . Hyperammonemia ([place]) 04/09/2019  . COVID-19 virus infection 11/27/2018  . Acute respiratory disease due to COVID-19 virus 11/27/2018  . SIRS (systemic inflammatory response syndrome) ([place]) 11/25/2018  . Acute on chronic respiratory failure with hypoxia ([place]) 11/21/2018  . Suspected Covid-19 Virus Infection 11/17/2018    Past Medical History: Patient does not report any past medical history, but has not seen a doctor in 13 years.  Past Surgical History: No past surgeries.  Social History: Social History   Tobacco Use  . Smoking status: Current Every Day Smoker    Packs/day: 0.30    Types: Cigarettes  . Smokeless tobacco: Never Used  Substance Use Topics  . Alcohol use: Yes    Alcohol/week: 2.0 standard drinks    Types: 2 Cans of beer per week   . Drug use: Not on file   Additional social history: Patient is from [place] originally.  He has been in this country for 13 years.  He lives with his boss, who is a.  He works in [employer].  He quit smoking cigarettes 6 months ago, after he got coronavirus.  He does not drink hard liquor or wine, states that he drinks a couple of beers 2-3 nights per week.  He does not use recreational drugs. Please also refer to relevant sections of EMR.  Family History: Family History  Problem Relation Age of Onset  . CVA Father     Allergies and Medications: Allergies  Allergen Reactions  . Pork-Derived Products Other (See Comments)    "I have pain in my stomach if I eat this"   No current facility-administered medications on file prior to encounter.    Current Outpatient Medications on File Prior to Encounter  Medication Sig Dispense Refill  . enoxaparin (LOVENOX) 40 MG/0.4ML injection Inject 0.4 mLs (40 mg total) into the skin daily. (Patient not taking: Reported on 04/09/2019) 10 Syringe 0  . pantoprazole (PROTONIX) 40 MG tablet Take 1 tablet (40 mg total) by mouth daily. (Patient not taking: Reported on  04/09/2019) 30 tablet 0  . polyvinyl alcohol (LIQUIFILM TEARS) 1.4 % ophthalmic solution Place 1 drop into both eyes as needed for dry eyes (allergies).      Objective: BP (!) 158/102   Pulse 81   Temp 98.5 F (36.9 C) (Oral)   Resp (!) 24   Ht _0  (1.753 m)   Wt 87.1 kg   SpO2 96%   BMI 28.35 kg/m  Exam: General: Jaundiced, no acute distress Eyes: Icteric sclerae ENTM: Clear oropharynx, moist mucous membranes Neck: Supple Cardiovascular: Distant heart sounds Respiratory: Clear to auscultation bilaterally Gastrointestinal: Hepatomegaly, distended, nontender, soft, no splenomegaly. MSK: Tender 2 cm swelling directly underneath areola on the left Derm: Easy bruising in many sites including left elbow, right abdomen, lateral right eye; approximately 1 cm lesion on right anterior  shoulder with asymmetrical border, variegated color, no ulceration Neuro: Asterixis present Psych: Very pleasant, friendly man, mood and affect are normal  Labs and Imaging: CBC BMET  Recent Labs  Lab 04/09/19 1317 04/09/19 1635  WBC 10.0  --   HGB 13.0  --   HCT 39.7  --   PLT PLATELET CLUMPS NOTED ON SMEAR, COUNT APPEARS DECREASED 106*   Recent Labs  Lab 04/09/19 1317  NA 134*  K 3.8  CL 97*  CO2 21*  BUN <5*  CREATININE 1.39*  GLUCOSE 124*  CALCIUM 9.0     Ct Abdomen Pelvis Wo Contrast  Addendum Date: 04/09/2019   ADDENDUM REPORT: 04/09/2019 17:32 ADDENDUM: The patient has bilateral gynecomastia, left greater than right. This was discussed with Sophia Caccavale, PA-C. Electronically Signed   By: Claudie Revering M.D.   On: 04/09/2019 17:32   Result Date: 04/09/2019 CLINICAL DATA:  Diffuse tremors and black spots on his skin, possibly bruises, for the past 3 days. Gross hematuria for the past 4 days. EXAM: CT CHEST, ABDOMEN AND PELVIS WITHOUT CONTRAST TECHNIQUE: Multidetector CT imaging of the chest, abdomen and pelvis was performed following the standard protocol without IV contrast. COMPARISON:  Portable chest dated 11/17/2018. FINDINGS: CT CHEST FINDINGS Cardiovascular: No significant vascular findings. Normal heart size. No pericardial effusion. Mediastinum/Nodes: No enlarged mediastinal, hilar, or axillary lymph nodes. Thyroid gland, trachea, and esophagus demonstrate no significant findings. Lungs/Pleura: Right lung calcified granulomata. No airspace consolidation or pleural fluid Musculoskeletal: Thoracic spine degenerative changes. CT ABDOMEN PELVIS FINDINGS Hepatobiliary: Diffusely enlarged liver with diffuse low density relative to the spleen. Medium density material filling an otherwise normal appearing gallbladder. Pancreas: Unremarkable. No pancreatic ductal dilatation or surrounding inflammatory changes. Spleen: Normal in size without focal abnormality. Adrenals/Urinary  Tract: Adrenal glands are unremarkable. Kidneys are normal, without renal calculi, focal lesion, or hydronephrosis. Bladder is unremarkable. Stomach/Bowel: Stomach is within normal limits. Appendix appears normal. No evidence of bowel wall thickening, distention, or inflammatory changes. Vascular/Lymphatic: No significant vascular findings are present. No enlarged abdominal or pelvic lymph nodes. Reproductive: Mildly enlarged prostate gland. Other: No abdominal wall hernia or abnormality. No abdominopelvic ascites. Musculoskeletal: Lumbar spine degenerative changes. Small, rounded lucent lesion with a surrounding sclerotic rim in the medial femoral head on the left and small cystic area or synovial pit in the lateral femoral head and neck junction. IMPRESSION: 1. No acute abnormality in the chest, abdomen or pelvis. 2. Hepatomegaly with diffuse hepatic steatosis. 3. Medium density material filling an otherwise normal appearing gallbladder. This is compatible with sludge filling the gallbladder. 4. Mildly prostatic hypertrophy. Electronically Signed: By: Claudie Revering M.D. On: 04/09/2019 16:08   Ct Chest Wo  Contrast  Addendum Date: 04/09/2019   ADDENDUM REPORT: 04/09/2019 17:32 ADDENDUM: The patient has bilateral gynecomastia, left greater than right. This was discussed with Sophia Caccavale, PA-C. Electronically Signed   By: Claudie Revering M.D.   On: 04/09/2019 17:32   Result Date: 04/09/2019 CLINICAL DATA:  Diffuse tremors and black spots on his skin, possibly bruises, for the past 3 days. Gross hematuria for the past 4 days. EXAM: CT CHEST, ABDOMEN AND PELVIS WITHOUT CONTRAST TECHNIQUE: Multidetector CT imaging of the chest, abdomen and pelvis was performed following the standard protocol without IV contrast. COMPARISON:  Portable chest dated 11/17/2018. FINDINGS: CT CHEST FINDINGS Cardiovascular: No significant vascular findings. Normal heart size. No pericardial effusion. Mediastinum/Nodes: No enlarged  mediastinal, hilar, or axillary lymph nodes. Thyroid gland, trachea, and esophagus demonstrate no significant findings. Lungs/Pleura: Right lung calcified granulomata. No airspace consolidation or pleural fluid Musculoskeletal: Thoracic spine degenerative changes. CT ABDOMEN PELVIS FINDINGS Hepatobiliary: Diffusely enlarged liver with diffuse low density relative to the spleen. Medium density material filling an otherwise normal appearing gallbladder. Pancreas: Unremarkable. No pancreatic ductal dilatation or surrounding inflammatory changes. Spleen: Normal in size without focal abnormality. Adrenals/Urinary Tract: Adrenal glands are unremarkable. Kidneys are normal, without renal calculi, focal lesion, or hydronephrosis. Bladder is unremarkable. Stomach/Bowel: Stomach is within normal limits. Appendix appears normal. No evidence of bowel wall thickening, distention, or inflammatory changes. Vascular/Lymphatic: No significant vascular findings are present. No enlarged abdominal or pelvic lymph nodes. Reproductive: Mildly enlarged prostate gland. Other: No abdominal wall hernia or abnormality. No abdominopelvic ascites. Musculoskeletal: Lumbar spine degenerative changes. Small, rounded lucent lesion with a surrounding sclerotic rim in the medial femoral head on the left and small cystic area or synovial pit in the lateral femoral head and neck junction. IMPRESSION: 1. No acute abnormality in the chest, abdomen or pelvis. 2. Hepatomegaly with diffuse hepatic steatosis. 3. Medium density material filling an otherwise normal appearing gallbladder. This is compatible with sludge filling the gallbladder. 4. Mildly prostatic hypertrophy. Electronically Signed: By: Claudie Revering M.D. On: 04/09/2019 16:08   US Abdomen Limited Ruq  Result Date: 04/09/2019 CLINICAL DATA:  Elevated liver function tests and bilirubin. EXAM: ULTRASOUND ABDOMEN LIMITED RIGHT UPPER QUADRANT COMPARISON:  Chest, abdomen and pelvis CT obtained  earlier today. FINDINGS: Gallbladder: No gallstones or wall thickening visualized. No sonographic Murphy sign noted by sonographer. Thin internal septation in the gallbladder fundus or minimal amount of pericholecystic fluid or wall edema. On the CT earlier today, this has an appearance compatible with a thin internal septation with no wall edema or pericholecystic fluid seen. Common bile duct: Diameter: 3.6 mm. Liver: Diffusely echogenic. Corresponding marked diffuse low density on the previous CT. Portal vein is patent on color Doppler imaging with normal direction of blood flow towards the liver. Other: None. IMPRESSION: 1. No acute abnormality. 2. Marked diffuse hepatic steatosis. 3. Thin internal septation in the gallbladder fundus. Electronically Signed   By: Claudie Revering M.D.   On: 04/09/2019 19:32    Gladys Damme, MD PGY-1, [place] Intern pager: [phone], text pages welcome  FPTS Upper-Level Resident Addendum   I have independently interviewed and examined the patient. I have discussed the above with the original author and agree with their documentation. My edits for correction/addition/clarification are in blue. Please see also any attending notes.    Kathrene Alu, MD PGY-3, [facility] 04/09/2019 8:55 PM  FPTS Service pager: [phone] (text pages welcome through [place])

## 2019-04-09 NOTE — ED Provider Notes (Signed)
[facility] [facility] EMERGENCY DEPARTMENT Provider Note   CSN: [id number] Arrival date & time: 04/09/19  1226     History   Chief Complaint Chief Complaint  Patient presents with   Tremors   Urinary Tract Infection    HPI Micheal Watson is a 58 y.o. male presenting for evaluations of tremors, dark urine, and bruising.  Patient states of the past 4 days, he has had persistent full-body tremors.  It is constant, nothing makes it better or worse.  He also reports decreased urination, and his urine is very dark.  He has noticed some bruises on his stomach and arm, but no known injury.  He reports decreased appetite, no nausea or vomiting.  He states he drinks 1-2 beers 3 times a week, no recent increase in alcohol use.  Last drink was 5 or 6 days ago.  He denies tobacco or drug use.  He denies fevers, chills, sore throat, cough, abdominal pain, abnormal bowel movements.  He denies sick contacts.  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  He denies tylenol use.   Additional history obtained from chart review.  Patient admitted with COVID 5 months ago.      HPI  History reviewed. No pertinent past medical history.  Patient Active Problem List   Diagnosis Date Noted   COVID-19 virus infection 11/27/2018   Acute respiratory disease due to COVID-19 virus 11/27/2018   SIRS (systemic inflammatory response syndrome) ([facility]) 11/25/2018   Acute on chronic respiratory failure with hypoxia ([place]) 11/21/2018   Suspected Covid-19 Virus Infection 11/17/2018    History reviewed. No pertinent surgical history.      Home Medications    Prior to Admission medications   Medication Sig Start Date End Date Taking? Authorizing Provider  enoxaparin (LOVENOX) 40 MG/0.4ML injection Inject 0.4 mLs (40 mg total) into the skin daily. 11/27/18   Elgergawy, Silver Huguenin, MD  pantoprazole (PROTONIX) 40 MG tablet Take 1 tablet (40 mg total) by mouth daily. 11/27/18 11/27/19   Elgergawy, Silver Huguenin, MD  polyvinyl alcohol (LIQUIFILM TEARS) 1.4 % ophthalmic solution Place 1 drop into both eyes as needed for dry eyes (allergies).    [provider]    Family History History reviewed. No pertinent family history.  Social History Social History   Tobacco Use   Smoking status: Current Every Day Smoker    Packs/day: 0.30    Types: Cigarettes   Smokeless tobacco: Never Used  Substance Use Topics   Alcohol use: Yes    Alcohol/week: 2.0 standard drinks    Types: 2 Cans of beer per week   Drug use: Not on file     Allergies   Patient has no known allergies.   Review of Systems Review of Systems  Constitutional: Positive for appetite change.  Genitourinary:       Dark urine  Neurological: Positive for tremors.  Hematological:       [place]  All other systems reviewed and are negative.    Physical Exam Updated Vital Signs BP (!) 148/115    Pulse (!) 105    Temp 98.5 F (36.9 C) (Oral)    Resp (!) 23    Ht '5\' 9"'$  (1.753 m)    Wt 87.1 kg    SpO2 100%    BMI 28.35 kg/m   Physical Exam Vitals signs and nursing note reviewed.  Constitutional:      General: He is not in acute distress.  Appearance: He is well-developed.     Comments: nontoxic  HENT:     Head: Normocephalic and atraumatic.  Eyes:     General: Scleral icterus present.     Extraocular Movements: Extraocular movements intact.     Conjunctiva/sclera: Conjunctivae normal.     Pupils: Pupils are equal, round, and reactive to light.     Comments: Mild scleral icterus  Neck:     Musculoskeletal: Normal range of motion and neck supple.  Cardiovascular:     Rate and Rhythm: Normal rate and regular rhythm.     Pulses: Normal pulses.     Comments: Heart rate normal on my exam Pulmonary:     Effort: Pulmonary effort is normal. No respiratory distress.     Breath sounds: Normal breath sounds. No wheezing.  Chest:       Comments: Approximately 3 cm palpable nodule under  the nipple.  No erythema.  Tender to palpation.  No drainage.  No nipple drainage. Abdominal:     General: There is no distension.     Palpations: Abdomen is soft. There is no mass.     Tenderness: There is no abdominal tenderness. There is no guarding or rebound.       Comments: Contusion without tenderness. No tenderness palpation elsewhere in the abdomen.  Soft no rigidity, guarding, distention.  Musculoskeletal: Normal range of motion.  Skin:    General: Skin is warm and dry.     Capillary Refill: Capillary refill takes less than 2 seconds.  Neurological:     Mental Status: He is alert and oriented to person, place, and time.     Comments: Tremors noted in all 4 extremities, both intentional unintentional.  No other obvious neuro deficits, however difficult to assess due to tremors.      ED Treatments / Results  Labs (all labs ordered are listed, but only abnormal results are displayed) Labs Reviewed  URINALYSIS, ROUTINE W REFLEX MICROSCOPIC - Abnormal; Notable for the following components:      Result Value   Color, Urine AMBER (*)    APPearance TURBID (*)    Specific Gravity, Urine 1.035 (*)    Bilirubin Urine MODERATE (*)    Protein, ur 100 (*)    Bacteria, UA RARE (*)    All other components within normal limits  BASIC METABOLIC PANEL - Abnormal; Notable for the following components:   Sodium 134 (*)    Chloride 97 (*)    CO2 21 (*)    Glucose, Bld 124 (*)    BUN <5 (*)    Creatinine, Ser 1.39 (*)    GFR calc non Af Amer 55 (*)    Anion gap 16 (*)    All other components within normal limits  CBC - Abnormal; Notable for the following components:   RBC 3.71 (*)    MCV 107.0 (*)    MCH 35.0 (*)    nRBC 0.5 (*)    All other components within normal limits  MAGNESIUM - Abnormal; Notable for the following components:   Magnesium 1.5 (*)    All other components within normal limits  PHOSPHORUS - Abnormal; Notable for the following components:   Phosphorus 2.2  (*)    All other components within normal limits  HEPATIC FUNCTION PANEL - Abnormal; Notable for the following components:   Albumin 3.0 (*)    AST 309 (*)    ALT 126 (*)    Alkaline Phosphatase 230 (*)    Total  Bilirubin 6.8 (*)    Bilirubin, Direct 4.2 (*)    Indirect Bilirubin 2.6 (*)    All other components within normal limits  LACTATE DEHYDROGENASE - Abnormal; Notable for the following components:   LDH 269 (*)    All other components within normal limits  AMMONIA - Abnormal; Notable for the following components:   Ammonia 81 (*)    All other components within normal limits  CBG MONITORING, ED - Abnormal; Notable for the following components:   Glucose-Capillary 115 (*)    All other components within normal limits  CBG MONITORING, ED - Abnormal; Notable for the following components:   Glucose-Capillary 130 (*)    All other components within normal limits  URINE CULTURE  SARS CORONAVIRUS 2 (HOSPITAL ORDER, [facility])  CK  ETHANOL  HAPTOGLOBIN  PLATELET COUNT  ACETAMINOPHEN LEVEL  PROTIME-INR  HEPATITIS PANEL, ACUTE  RETICULOCYTES  PATHOLOGIST SMEAR REVIEW  SAVE SMEAR ([facility])    EKG None  Radiology Ct Abdomen Pelvis Wo Contrast  Addendum Date: 04/09/2019   ADDENDUM REPORT: 04/09/2019 17:32 ADDENDUM: The patient has bilateral gynecomastia, left greater than right. This was discussed with Fleur Audino, PA-C. Electronically Signed   By: Claudie Revering M.D.   On: 04/09/2019 17:32   Result Date: 04/09/2019 CLINICAL DATA:  Diffuse tremors and black spots on his skin, possibly bruises, for the past 3 days. Gross hematuria for the past 4 days. EXAM: CT CHEST, ABDOMEN AND PELVIS WITHOUT CONTRAST TECHNIQUE: Multidetector CT imaging of the chest, abdomen and pelvis was performed following the standard protocol without IV contrast. COMPARISON:  Portable chest dated 11/17/2018. FINDINGS: CT CHEST FINDINGS Cardiovascular: No significant vascular findings.  Normal heart size. No pericardial effusion. Mediastinum/Nodes: No enlarged mediastinal, hilar, or axillary lymph nodes. Thyroid gland, trachea, and esophagus demonstrate no significant findings. Lungs/Pleura: Right lung calcified granulomata. No airspace consolidation or pleural fluid Musculoskeletal: Thoracic spine degenerative changes. CT ABDOMEN PELVIS FINDINGS Hepatobiliary: Diffusely enlarged liver with diffuse low density relative to the spleen. Medium density material filling an otherwise normal appearing gallbladder. Pancreas: Unremarkable. No pancreatic ductal dilatation or surrounding inflammatory changes. Spleen: Normal in size without focal abnormality. Adrenals/Urinary Tract: Adrenal glands are unremarkable. Kidneys are normal, without renal calculi, focal lesion, or hydronephrosis. Bladder is unremarkable. Stomach/Bowel: Stomach is within normal limits. Appendix appears normal. No evidence of bowel wall thickening, distention, or inflammatory changes. Vascular/Lymphatic: No significant vascular findings are present. No enlarged abdominal or pelvic lymph nodes. Reproductive: Mildly enlarged prostate gland. Other: No abdominal wall hernia or abnormality. No abdominopelvic ascites. Musculoskeletal: Lumbar spine degenerative changes. Small, rounded lucent lesion with a surrounding sclerotic rim in the medial femoral head on the left and small cystic area or synovial pit in the lateral femoral head and neck junction. IMPRESSION: 1. No acute abnormality in the chest, abdomen or pelvis. 2. Hepatomegaly with diffuse hepatic steatosis. 3. Medium density material filling an otherwise normal appearing gallbladder. This is compatible with sludge filling the gallbladder. 4. Mildly prostatic hypertrophy. Electronically Signed: By: Claudie Revering M.D. On: 04/09/2019 16:08   Ct Chest Wo Contrast  Addendum Date: 04/09/2019   ADDENDUM REPORT: 04/09/2019 17:32 ADDENDUM: The patient has bilateral gynecomastia, left  greater than right. This was discussed with Tanika Bracco, PA-C. Electronically Signed   By: Claudie Revering M.D.   On: 04/09/2019 17:32   Result Date: 04/09/2019 CLINICAL DATA:  Diffuse tremors and black spots on his skin, possibly bruises, for the past 3 days. Gross hematuria  for the past 4 days. EXAM: CT CHEST, ABDOMEN AND PELVIS WITHOUT CONTRAST TECHNIQUE: Multidetector CT imaging of the chest, abdomen and pelvis was performed following the standard protocol without IV contrast. COMPARISON:  Portable chest dated 11/17/2018. FINDINGS: CT CHEST FINDINGS Cardiovascular: No significant vascular findings. Normal heart size. No pericardial effusion. Mediastinum/Nodes: No enlarged mediastinal, hilar, or axillary lymph nodes. Thyroid gland, trachea, and esophagus demonstrate no significant findings. Lungs/Pleura: Right lung calcified granulomata. No airspace consolidation or pleural fluid Musculoskeletal: Thoracic spine degenerative changes. CT ABDOMEN PELVIS FINDINGS Hepatobiliary: Diffusely enlarged liver with diffuse low density relative to the spleen. Medium density material filling an otherwise normal appearing gallbladder. Pancreas: Unremarkable. No pancreatic ductal dilatation or surrounding inflammatory changes. Spleen: Normal in size without focal abnormality. Adrenals/Urinary Tract: Adrenal glands are unremarkable. Kidneys are normal, without renal calculi, focal lesion, or hydronephrosis. Bladder is unremarkable. Stomach/Bowel: Stomach is within normal limits. Appendix appears normal. No evidence of bowel wall thickening, distention, or inflammatory changes. Vascular/Lymphatic: No significant vascular findings are present. No enlarged abdominal or pelvic lymph nodes. Reproductive: Mildly enlarged prostate gland. Other: No abdominal wall hernia or abnormality. No abdominopelvic ascites. Musculoskeletal: Lumbar spine degenerative changes. Small, rounded lucent lesion with a surrounding sclerotic rim in the  medial femoral head on the left and small cystic area or synovial pit in the lateral femoral head and neck junction. IMPRESSION: 1. No acute abnormality in the chest, abdomen or pelvis. 2. Hepatomegaly with diffuse hepatic steatosis. 3. Medium density material filling an otherwise normal appearing gallbladder. This is compatible with sludge filling the gallbladder. 4. Mildly prostatic hypertrophy. Electronically Signed: By: Claudie Revering M.D. On: 04/09/2019 16:08    Procedures Procedures (including critical care time)  Medications Ordered in ED Medications  sodium chloride 0.9 % bolus 1,000 mL (1,000 mLs Intravenous New Bag/Given 04/09/19 1530)     Initial Impression / Assessment and Plan / ED Course  I have reviewed the triage vital signs and the nursing notes.  Pertinent labs & imaging results that were available during my care of the patient were reviewed by me and considered in my medical decision making (see chart for details).        Patient presenting for evaluation of tremors, dark urine, and bruising.  Physical exam shows patient appears nontoxic.  He does have tremors x4 without other obvious neuro deficits.  Will obtain labs and urine.  CT abdomen pelvis to rule out urinary obstruction due to decreased urine and [employer] color.  CT chest added on due to patient's mass of left breast.  Labs show slight AKI, although BUN is not elevated.  LFTs elevated with an AST, ALT, and alk phos of 309, 126, 230 respectively.  T bili 6.8.  UA shows bili, but no infection.  CK negative.  Discussed with attending, will add on LDH and haptoglobin to assess for possible ITP.  Unfortunately platelets were clumped, will need to reorder.  CT shows hepatomegaly with gallbladder sludge. Otherwise no acute findings. bilat gynecomastia, worse on L. I discussed with radiologist, this is likely the mass palpated on exam. Will order RUQ [place] and consult with GI.   Discussed with Dr. Cristina Gong from [facility] who  will consult on pt. No acute interventions. Per GI, likely alcohol related.   I confirmed with pt amount of alcohol, pt maintains he is drinking ~2 beers 2-3 times a week. No tylenol use. Will call for admission.   Discussed with family med, pt to be admitted.    Final  Clinical Impressions(s) / ED Diagnoses   Final diagnoses:  Elevated LFTs  Hyperbilirubinemia  Tremor of unknown origin    ED Discharge Orders    None       Franchot Heidelberg, PA-C 04/09/19 1737    Carmin Muskrat, MD 04/10/19 [phone]

## 2019-04-10 ENCOUNTER — Encounter (HOSPITAL_COMMUNITY): Payer: Self-pay

## 2019-04-10 LAB — CBC
HCT: 35.3 % — ABNORMAL LOW (ref 39.0–52.0)
Hemoglobin: 12 g/dL — ABNORMAL LOW (ref 13.0–17.0)
MCH: 35.6 pg — ABNORMAL HIGH (ref 26.0–34.0)
MCHC: 34 g/dL (ref 30.0–36.0)
MCV: 104.7 fL — ABNORMAL HIGH (ref 80.0–100.0)
Platelets: 95 10*3/uL — ABNORMAL LOW (ref 150–400)
RBC: 3.37 MIL/uL — ABNORMAL LOW (ref 4.22–5.81)
RDW: 13.7 % (ref 11.5–15.5)
WBC: 8.4 10*3/uL (ref 4.0–10.5)
nRBC: 0.6 % — ABNORMAL HIGH (ref 0.0–0.2)

## 2019-04-10 LAB — HEPATITIS PANEL, ACUTE
HCV Ab: <0.1 s/co ratio (ref 0.0–0.9)
Hep A IgM: NEGATIVE
Hep B C IgM: NEGATIVE
Hepatitis B Surface Ag: NEGATIVE

## 2019-04-10 LAB — VITAMIN B12: Vitamin B-12: 1288 pg/mL — ABNORMAL HIGH (ref 180–914)

## 2019-04-10 LAB — COMPREHENSIVE METABOLIC PANEL
ALT: 104 U/L — ABNORMAL HIGH (ref 0–44)
AST: 237 U/L — ABNORMAL HIGH (ref 15–41)
Albumin: 2.8 g/dL — ABNORMAL LOW (ref 3.5–5.0)
Alkaline Phosphatase: 201 U/L — ABNORMAL HIGH (ref 38–126)
Anion gap: 11 (ref 5–15)
BUN: <5 mg/dL — ABNORMAL LOW (ref 6–20)
CO2: 23 mmol/L (ref 22–32)
Calcium: 8.4 mg/dL — ABNORMAL LOW (ref 8.9–10.3)
Chloride: 100 mmol/L (ref 98–111)
Creatinine, Ser: 0.93 mg/dL (ref 0.61–1.24)
GFR calc Af Amer: >60 mL/min (ref >60)
GFR calc non Af Amer: >60 mL/min (ref >60)
Glucose, Bld: 123 mg/dL — ABNORMAL HIGH (ref 70–99)
Potassium: 3.2 mmol/L — ABNORMAL LOW (ref 3.5–5.1)
Sodium: 134 mmol/L — ABNORMAL LOW (ref 135–145)
Total Bilirubin: 7.4 mg/dL — ABNORMAL HIGH (ref 0.3–1.2)
Total Protein: 5.9 g/dL — ABNORMAL LOW (ref 6.5–8.1)

## 2019-04-10 LAB — HAPTOGLOBIN: Haptoglobin: 183 mg/dL (ref 29–370)

## 2019-04-10 LAB — IRON AND TIBC
Iron: 126 ug/dL (ref 45–182)
Saturation Ratios: 89 % — ABNORMAL HIGH (ref 17.9–39.5)
TIBC: 141 ug/dL — ABNORMAL LOW (ref 250–450)
UIBC: 15 ug/dL

## 2019-04-10 LAB — HEMOGLOBIN A1C
Hgb A1c MFr Bld: 6 % — ABNORMAL HIGH (ref 4.8–5.6)
Mean Plasma Glucose: 125.5 mg/dL

## 2019-04-10 LAB — URINE CULTURE: Culture: NO GROWTH

## 2019-04-10 LAB — GAMMA GT: GGT: 2189 U/L — ABNORMAL HIGH (ref 7–50)

## 2019-04-10 LAB — FERRITIN: Ferritin: 2298 ng/mL — ABNORMAL HIGH (ref 24–336)

## 2019-04-10 MED ORDER — POTASSIUM CHLORIDE CRYS ER 20 MEQ PO TBCR
40.0000 meq | EXTENDED_RELEASE_TABLET | Freq: Once | ORAL | Status: AC
  Administered 2019-04-10: 40 meq via ORAL
  Filled 2019-04-10: qty 2

## 2019-04-10 MED ORDER — TRAZODONE HCL 50 MG PO TABS
50.0000 mg | ORAL_TABLET | Freq: Every evening | ORAL | Status: DC | PRN
  Administered 2019-04-10: 50 mg via ORAL
  Filled 2019-04-10: qty 1

## 2019-04-10 MED ORDER — ENSURE ENLIVE PO LIQD
237.0000 mL | Freq: Two times a day (BID) | ORAL | Status: DC
  Administered 2019-04-10 – 2019-04-11 (×2): 237 mL via ORAL

## 2019-04-10 NOTE — Progress Notes (Signed)
Mild improvement in transaminases, minimal rise in bilirubin, normal INR, and mildly elevated ammonia levels are all noted.  The patient denies pain.  He seems quite alert and appropriate.  The abdomen remains without overt hepatosplenomegaly but I do think he may be developing some ascites, with some flank dullness to percussion.  I note palmar erythema on exam today.  No new suggestions.  Cleotis Nipper, M.D. Pager [phone] If no answer or after 5 PM call [phone]

## 2019-04-10 NOTE — Progress Notes (Addendum)
Family Medicine Teaching Service Daily Progress Note Intern Pager: [phone]  Patient name: Micheal Watson Medical record number: [id number] Date of birth: 1960-09-03 Age: 58 y.o. Gender: male  Primary Care Provider: Patient, No Pcp Per Consultants: GI Code Status: full  Pt Overview and Major Events to Date:  Admitted 9/4  Assessment and Plan:  Likely alcoholic hepatitis Although ethanol level is negative on admission and patient reports drinking alcohol only a few times per week, elevated LFTs as well as AST/ALT ratio of >2, hyperbilirubinemia, thrombocytopenia, and gynecomastia on exam suggest that this is a likely cause of his thrombocytopenia, hyperammonemia, and tremor.  Titus panel is negative, haptoglobin is within normal limits, making a hemolytic process less likely.  Ferritin level is increased to 2298, although this is also an acute phase reactant.  Ferritin has been elevated in his previous hospitalization, but he was acutely ill at that time as well.  Reassuring that PT/INR is normal.  CIWA scores were 5 and 5 overnight.  Patient had 2 bowel movements overnight and received lactulose this morning.  CMP is significant for hypokalemia to 3.2 and continued elevated AST and ALT, although these are decreased from admission.  Bilirubin is increased to 7.4, which, according to the GI note, can be expected after cessation of alcohol.  Maddrey score indicates good prognosis and no current need for steroids. -Continue lactulose and titrate to 3 bowel movements per day -GGT and HIV -Continue thiamine -Continue folic acid -Continue MVI -Regular diet, stop fluid administration -Supplement potassium with K-Dur 40 mg x2 -CIWA protocol without ativan (nurse must page for ativan)  AKI, resolved Creatinine is 0.93 on 9/5, improved from 1.39 after fluid administration.  This indicates that his AKI was prerenal due to reduced p.o. intake. -Discontinue fluids, can restart if oral intake  remains low   Lesion on skin suspicious for cutaneous carcinoma A lesion has been present for more than a month on his right anterior shoulder that has asymmetric borders, variegated colors, not ulcerated, about 1 cm in size.   Recommend biopsy, can be done in the outpatient setting.   -Care management consult to set up PCP  Difficulty sleeping Patient reports that he did not sleep very much on the night of 9/4.  He is interested in something to help him sleep. -Start trazodone 50 mg nightly as needed  FEN/GI: Regular diet PPx: SCDs  Disposition: home after continued workup  Subjective: Patient reports that he is overall feeling well but did not sleep well last night.  He is not in pain.  Objective: Temp:  [98.5 F (36.9 C)-99.5 F (37.5 C)] 99.5 F (37.5 C) (09/05 0745) Pulse Rate:  [79-105] 94 (09/05 0745) Resp:  [16-24] 16 (09/05 0745) BP: (132-158)/(83-115) 137/84 (09/05 0745) SpO2:  [95 %-100 %] 96 % (09/05 0745) Weight:  [87.1 kg] 87.1 kg (09/04 1305) Physical Exam: General: Jaundiced appearing gentleman lying comfortably in bed, slight tremor noted Cardiovascular: RRR, no MRG Respiratory: CTAB, comfortable work of breathing on room air Abdomen: Distended abdomen, soft, nontender, nontender hepatomegaly palpated Extremities: No pedal edema, moves all extremities spontaneously, ecchymosis on left arm  Laboratory: Recent Labs  Lab 04/09/19 1317 04/09/19 1635 04/10/19 0423  WBC 10.0  --  8.4  HGB 13.0  --  12.0*  HCT 39.7  --  35.3*  PLT PLATELET CLUMPS NOTED ON SMEAR, COUNT APPEARS DECREASED 106* 95*   Recent Labs  Lab 04/09/19 1317 04/09/19 1525 04/10/19 0423  NA 134*  --  134*  K 3.8  --  3.2*  CL 97*  --  100  CO2 21*  --  23  BUN <5*  --  <5*  CREATININE 1.39*  --  0.93  CALCIUM 9.0  --  8.4*  PROT  --  6.5 5.9*  BILITOT  --  6.8* 7.4*  ALKPHOS  --  230* 201*  ALT  --  126* 104*  AST  --  309* 237*  GLUCOSE 124*  --  123*    Imaging/Diagnostic  Tests: Ct Abdomen Pelvis Wo Contrast  Addendum Date: 04/09/2019   ADDENDUM REPORT: 04/09/2019 17:32 ADDENDUM: The patient has bilateral gynecomastia, left greater than right. This was discussed with Sophia Caccavale, PA-C. Electronically Signed   By: Claudie Revering M.D.   On: 04/09/2019 17:32   Result Date: 04/09/2019 CLINICAL DATA:  Diffuse tremors and black spots on his skin, possibly bruises, for the past 3 days. Gross hematuria for the past 4 days. EXAM: CT CHEST, ABDOMEN AND PELVIS WITHOUT CONTRAST TECHNIQUE: Multidetector CT imaging of the chest, abdomen and pelvis was performed following the standard protocol without IV contrast. COMPARISON:  Portable chest dated 11/17/2018. FINDINGS: CT CHEST FINDINGS Cardiovascular: No significant vascular findings. Normal heart size. No pericardial effusion. Mediastinum/Nodes: No enlarged mediastinal, hilar, or axillary lymph nodes. Thyroid gland, trachea, and esophagus demonstrate no significant findings. Lungs/Pleura: Right lung calcified granulomata. No airspace consolidation or pleural fluid Musculoskeletal: Thoracic spine degenerative changes. CT ABDOMEN PELVIS FINDINGS Hepatobiliary: Diffusely enlarged liver with diffuse low density relative to the spleen. Medium density material filling an otherwise normal appearing gallbladder. Pancreas: Unremarkable. No pancreatic ductal dilatation or surrounding inflammatory changes. Spleen: Normal in size without focal abnormality. Adrenals/Urinary Tract: Adrenal glands are unremarkable. Kidneys are normal, without renal calculi, focal lesion, or hydronephrosis. Bladder is unremarkable. Stomach/Bowel: Stomach is within normal limits. Appendix appears normal. No evidence of bowel wall thickening, distention, or inflammatory changes. Vascular/Lymphatic: No significant vascular findings are present. No enlarged abdominal or pelvic lymph nodes. Reproductive: Mildly enlarged prostate gland. Other: No abdominal wall hernia or  abnormality. No abdominopelvic ascites. Musculoskeletal: Lumbar spine degenerative changes. Small, rounded lucent lesion with a surrounding sclerotic rim in the medial femoral head on the left and small cystic area or synovial pit in the lateral femoral head and neck junction. IMPRESSION: 1. No acute abnormality in the chest, abdomen or pelvis. 2. Hepatomegaly with diffuse hepatic steatosis. 3. Medium density material filling an otherwise normal appearing gallbladder. This is compatible with sludge filling the gallbladder. 4. Mildly prostatic hypertrophy. Electronically Signed: By: Claudie Revering M.D. On: 04/09/2019 16:08   Ct Chest Wo Contrast  Addendum Date: 04/09/2019   ADDENDUM REPORT: 04/09/2019 17:32 ADDENDUM: The patient has bilateral gynecomastia, left greater than right. This was discussed with Sophia Caccavale, PA-C. Electronically Signed   By: Claudie Revering M.D.   On: 04/09/2019 17:32   Result Date: 04/09/2019 CLINICAL DATA:  Diffuse tremors and black spots on his skin, possibly bruises, for the past 3 days. Gross hematuria for the past 4 days. EXAM: CT CHEST, ABDOMEN AND PELVIS WITHOUT CONTRAST TECHNIQUE: Multidetector CT imaging of the chest, abdomen and pelvis was performed following the standard protocol without IV contrast. COMPARISON:  Portable chest dated 11/17/2018. FINDINGS: CT CHEST FINDINGS Cardiovascular: No significant vascular findings. Normal heart size. No pericardial effusion. Mediastinum/Nodes: No enlarged mediastinal, hilar, or axillary lymph nodes. Thyroid gland, trachea, and esophagus demonstrate no significant findings. Lungs/Pleura: Right lung calcified granulomata. No airspace consolidation or pleural fluid Musculoskeletal: Thoracic spine  degenerative changes. CT ABDOMEN PELVIS FINDINGS Hepatobiliary: Diffusely enlarged liver with diffuse low density relative to the spleen. Medium density material filling an otherwise normal appearing gallbladder. Pancreas: Unremarkable. No  pancreatic ductal dilatation or surrounding inflammatory changes. Spleen: Normal in size without focal abnormality. Adrenals/Urinary Tract: Adrenal glands are unremarkable. Kidneys are normal, without renal calculi, focal lesion, or hydronephrosis. Bladder is unremarkable. Stomach/Bowel: Stomach is within normal limits. Appendix appears normal. No evidence of bowel wall thickening, distention, or inflammatory changes. Vascular/Lymphatic: No significant vascular findings are present. No enlarged abdominal or pelvic lymph nodes. Reproductive: Mildly enlarged prostate gland. Other: No abdominal wall hernia or abnormality. No abdominopelvic ascites. Musculoskeletal: Lumbar spine degenerative changes. Small, rounded lucent lesion with a surrounding sclerotic rim in the medial femoral head on the left and small cystic area or synovial pit in the lateral femoral head and neck junction. IMPRESSION: 1. No acute abnormality in the chest, abdomen or pelvis. 2. Hepatomegaly with diffuse hepatic steatosis. 3. Medium density material filling an otherwise normal appearing gallbladder. This is compatible with sludge filling the gallbladder. 4. Mildly prostatic hypertrophy. Electronically Signed: By: Claudie Revering M.D. On: 04/09/2019 16:08   US Abdomen Limited Ruq  Result Date: 04/09/2019 CLINICAL DATA:  Elevated liver function tests and bilirubin. EXAM: ULTRASOUND ABDOMEN LIMITED RIGHT UPPER QUADRANT COMPARISON:  Chest, abdomen and pelvis CT obtained earlier today. FINDINGS: Gallbladder: No gallstones or wall thickening visualized. No sonographic Murphy sign noted by sonographer. Thin internal septation in the gallbladder fundus or minimal amount of pericholecystic fluid or wall edema. On the CT earlier today, this has an appearance compatible with a thin internal septation with no wall edema or pericholecystic fluid seen. Common bile duct: Diameter: 3.6 mm. Liver: Diffusely echogenic. Corresponding marked diffuse low density on  the previous CT. Portal vein is patent on color Doppler imaging with normal direction of blood flow towards the liver. Other: None. IMPRESSION: 1. No acute abnormality. 2. Marked diffuse hepatic steatosis. 3. Thin internal septation in the gallbladder fundus. Electronically Signed   By: Claudie Revering M.D.   On: 04/09/2019 19:32     Kathrene Alu, MD 04/10/2019, 7:56 AM PGY-3, [place] Intern pager: [phone], text pages welcome

## 2019-04-10 NOTE — Progress Notes (Signed)
Initial Nutrition Assessment  DOCUMENTATION CODES:   Not applicable  INTERVENTION:  Continue MVI with minerals daily Continue Folvite daily Continue Thiamine daily  -Ensure Enlive po BID, each supplement provides 350 kcal and 20 grams of protein  NUTRITION DIAGNOSIS:   Increased nutrient needs related to acute illness as evidenced by estimated needs(pt reported decreased appetitie and intake 4 days PTA).   GOAL:   Patient will meet greater than or equal to 90% of their needs   MONITOR:   PO intake, Weight trends, Supplement acceptance, Labs, I & O's  REASON FOR ASSESSMENT:   Malnutrition Screening Tool    ASSESSMENT:  RD working remotely.  58 year old with past medical history significant of COVID diagnosis in April who presented to ED with juandice and tremor. He reports decreased appetite and intake x 4 days, subjective fever with chills, confusion, chest tightness, frequent bruising throughout body, and dark yellow urine.  CT of abdomen showed hepatomegaly with steatosis and gynecomastia; elevated liver chemistries suggestive of alcoholic hepatitis per GI  Per chart review pt denies significant EtOH history; reports a couple of beers after work 2-3 times/week  Eating 100% of meals at this time; RD will order Ensure BID to assist with calorie and protein needs.  Current wt 87.1 kg (191.6 lb) 11/20/18: 87.4 kg No weight history available - patient reported he has not been to a doctor in 13 years besides recent 10 day hospitalization for COVID in April  I/O: +1720   Medications reviewed and include: folic acid, MVI with minerals, thiamine, lactulose  Labs: CBGS 115-130 Na 134 Potassium 3.2 Ca corrects for low albumin 9.4 AST 237 ALT 104     NUTRITION - FOCUSED PHYSICAL EXAM: Unable to complete at this time; RD working remotely.    Diet Order:   Diet Order            Diet regular Room service appropriate? No; Fluid consistency: Thin  Diet effective  now              EDUCATION NEEDS:   No education needs have been identified at this time  Skin:  Skin Assessment: Reviewed RN Assessment  Last BM:  9/5  Height:   Ht Readings from Last 1 Encounters:  04/09/19 5\' 9"  (1.753 m)    Weight:   Wt Readings from Last 1 Encounters:  04/09/19 87.1 kg    Ideal Body Weight:  72.7 kg  BMI:  Body mass index is 28.35 kg/m.  Estimated Nutritional Needs:   Kcal:  [id number]  Protein:  114-123  Fluid:  >2.2L  Lajuan Lines, RD, LDN [facility] Telephone [phone] After Hours/Weekend Pager: [phone]

## 2019-04-11 DIAGNOSIS — E46 Unspecified protein-calorie malnutrition: Secondary | ICD-10-CM | POA: Diagnosis present

## 2019-04-11 LAB — CBC
HCT: 37 % — ABNORMAL LOW (ref 39.0–52.0)
Hemoglobin: 12.7 g/dL — ABNORMAL LOW (ref 13.0–17.0)
MCH: 35.5 pg — ABNORMAL HIGH (ref 26.0–34.0)
MCHC: 34.3 g/dL (ref 30.0–36.0)
MCV: 103.4 fL — ABNORMAL HIGH (ref 80.0–100.0)
Platelets: 118 10*3/uL — ABNORMAL LOW (ref 150–400)
RBC: 3.58 MIL/uL — ABNORMAL LOW (ref 4.22–5.81)
RDW: 13.9 % (ref 11.5–15.5)
WBC: 10 10*3/uL (ref 4.0–10.5)
nRBC: 0.8 % — ABNORMAL HIGH (ref 0.0–0.2)

## 2019-04-11 LAB — COMPREHENSIVE METABOLIC PANEL
ALT: 96 U/L — ABNORMAL HIGH (ref 0–44)
AST: 180 U/L — ABNORMAL HIGH (ref 15–41)
Albumin: 2.8 g/dL — ABNORMAL LOW (ref 3.5–5.0)
Alkaline Phosphatase: 215 U/L — ABNORMAL HIGH (ref 38–126)
Anion gap: 10 (ref 5–15)
BUN: 5 mg/dL — ABNORMAL LOW (ref 6–20)
CO2: 27 mmol/L (ref 22–32)
Calcium: 8.8 mg/dL — ABNORMAL LOW (ref 8.9–10.3)
Chloride: 100 mmol/L (ref 98–111)
Creatinine, Ser: 0.82 mg/dL (ref 0.61–1.24)
GFR calc Af Amer: >60 mL/min (ref >60)
GFR calc non Af Amer: >60 mL/min (ref >60)
Glucose, Bld: 105 mg/dL — ABNORMAL HIGH (ref 70–99)
Potassium: 3.5 mmol/L (ref 3.5–5.1)
Sodium: 137 mmol/L (ref 135–145)
Total Bilirubin: 5.2 mg/dL — ABNORMAL HIGH (ref 0.3–1.2)
Total Protein: 6.2 g/dL — ABNORMAL LOW (ref 6.5–8.1)

## 2019-04-11 LAB — LIPID PANEL
Cholesterol: 295 mg/dL — ABNORMAL HIGH (ref 0–200)
HDL: 14 mg/dL — ABNORMAL LOW (ref >40)
LDL Cholesterol: UNDETERMINED mg/dL (ref 0–99)
Total CHOL/HDL Ratio: 21.1 RATIO
Triglycerides: 499 mg/dL — ABNORMAL HIGH (ref <150)
VLDL: UNDETERMINED mg/dL (ref 0–40)

## 2019-04-11 LAB — HIV ANTIBODY (ROUTINE TESTING W REFLEX): HIV Screen 4th Generation wRfx: NONREACTIVE

## 2019-04-11 LAB — LDL CHOLESTEROL, DIRECT: Direct LDL: 173.5 mg/dL — ABNORMAL HIGH (ref 0–99)

## 2019-04-11 MED ORDER — ATORVASTATIN CALCIUM 40 MG PO TABS: 40.0000 mg | ORAL_TABLET | Freq: Every day | ORAL | 0 refills | Status: DC

## 2019-04-11 MED ORDER — AMLODIPINE BESYLATE 10 MG PO TABS
10.0000 mg | ORAL_TABLET | Freq: Every day | ORAL | Status: DC
  Administered 2019-04-11: 10 mg via ORAL
  Filled 2019-04-11: qty 1

## 2019-04-11 MED ORDER — NON FORMULARY: Freq: Every day | Status: DC

## 2019-04-11 MED ORDER — MELATONIN 3 MG PO TABS
3.0000 mg | ORAL_TABLET | Freq: Every day | ORAL | Status: DC
  Filled 2019-04-11: qty 1

## 2019-04-11 MED ORDER — AMLODIPINE BESYLATE 10 MG PO TABS: 10.0000 mg | ORAL_TABLET | Freq: Every day | ORAL | 0 refills | Status: DC

## 2019-04-11 MED ORDER — ATORVASTATIN CALCIUM 40 MG PO TABS: 40.0000 mg | ORAL_TABLET | Freq: Every day | ORAL | Status: DC

## 2019-04-11 NOTE — Progress Notes (Signed)
CSW left SA resources on the patient's chart.   CSW is signing off.   Domenic Schwab, MSW, [place] Worker [facility]  [phone]

## 2019-04-11 NOTE — Discharge Instructions (Signed)
Te tratamos de hepatitis alcohlica en el hospital. Es muy importante que deje de beber alcohol para mejorar. Tambin es importante que consulte a un mdico habitual para prevenir problemas de salud en el futuro. Su presin arterial y Museum/gallery exhibitions officer, por lo que estamos comenzando a tomar dos medicamentos llamados amlodipina y [place], [place] deber tomar una vez al da. Recoja estos en su farmacia ([facility] en [place]). Son baratos.  Estos medicamentos ayudarn a prevenir ataques cardacos y accidentes cerebrovasculares. Consulte al mdico en la cita que se indica en este documento. Esperamos que se siga sintiendo mejor.

## 2019-04-11 NOTE — Discharge Summary (Signed)
[facility] Discharge Summary  Patient name: Micheal Watson Medical record number: [id number] Date of birth: [birth year] Age: 58 y.o. Gender: male Date of Admission: 04/09/2019  Date of Discharge: 04/11/2019 Admitting Physician: Zenia Resides, MD  Primary Care Provider: Patient, No Pcp Per Consultants: GI  Indication for Hospitalization: alcoholic hepatitis  Discharge Diagnoses/Problem List:  alcoholic hepatitis Hyperlipidemia with hypertriglyceridemia Hypertension Prediabetes  Disposition: Home  Discharge Condition: Stable, improved  Discharge Exam:  General: Jaundiced, pleasant gentleman lying comfortably in bed eating breakfast, mild tremor noted Cardiovascular: RRR, no MRG Respiratory: CTA B, comfortable work of breathing on room air Abdomen: Abdomen softer on palpation, normal active bowel sounds, no ascites Extremities: Areas of ecchymosis most pronounced on left antecubital area, no pedal edema  Brief Hospital Course:  Micheal Watson was admitted on 04/09/2019 after presenting to the emergency department with scleral icterus and jaundice, easy bruising, and tremor.  Work-up revealed elevated LFTs with an AST to ALT ratio of 2:1, ammonia of 80, thrombocytopenia to about 100, and labs negative for hemolytic process.  CT chest/abdomen/pelvis significant only for hepatomegaly, and RUQ showed sludge in the gallbladder but no stones.  Ultrasound patient endorsed drinking alcohol a few times a week, although he may have understated his consumption.  Iron studies were obtained to look into possible hemosiderosis or hemochromatosis, but they were confounded by his liver disease.  GI was consulted and recommended trending LFTs, which slowly decreased during the 48 hours of hospitalization.  Patient's appetite also improved and his tremor improved slightly as well.  He was given lactulose to prevent encephalopathy.  His Maddrey score was well below 32, so  he did not qualify for corticosteroids and had a reassuring short-term prognosis overall.  His last alcoholic drink was 1 week prior to admission, so delirium tremens was thought to be unlikely.  He was noted to be hypertensive throughout his admission with most measurements in the 140/90 range, and his LDL was 157 with triglycerides of 499.  His hemoglobin A1c was 6.0.  He was started on amlodipine and atorvastatin on discharge and was agreeable to taking these medications daily.  He was counseled extensively on the importance of cessation of alcohol in order to improve his liver disease.  He was agreeable to this and seemed motivated at discharge, especially since he has recently quit smoking as well.  He has not seen a physician outpatient in over 13 years, so he was encouraged to see establish care and was given the contact information of community health and wellness.  Issues for Follow Up:  1. Please repeat CMP to monitor LFTs 2. Please continue to encourage abstinence from alcohol 3. Please monitor lipids and blood pressure and adjust medications as necessary 4. Please repeat anemia panel when no longer having acute liver inflammation  Significant Procedures: none  Significant Labs and Imaging:  Recent Labs  Lab 04/09/19 1317 04/09/19 1635 04/10/19 0423 04/11/19 0621  WBC 10.0  --  8.4 10.0  HGB 13.0  --  12.0* 12.7*  HCT 39.7  --  35.3* 37.0*  PLT PLATELET CLUMPS NOTED ON SMEAR, COUNT APPEARS DECREASED 106* 95* 118*   Recent Labs  Lab 04/09/19 1317 04/09/19 1525 04/10/19 0423 04/11/19 0621  NA 134*  --  134* 137  K 3.8  --  3.2* 3.5  CL 97*  --  100 100  CO2 21*  --  23 27  GLUCOSE 124*  --  123* 105*  BUN <5*  --  <  5* 5*  CREATININE 1.39*  --  0.93 0.82  CALCIUM 9.0  --  8.4* 8.8*  MG  --  1.5*  --   --   PHOS  --  2.2*  --   --   ALKPHOS  --  230* 201* 215*  AST  --  309* 237* 180*  ALT  --  126* 104* 96*  ALBUMIN  --  3.0* 2.8* 2.8*   Ct Abdomen Pelvis Wo  Contrast  Addendum Date: 04/09/2019   ADDENDUM REPORT: 04/09/2019 17:32 ADDENDUM: The patient has bilateral gynecomastia, left greater than right. This was discussed with Sophia Caccavale, PA-C. Electronically Signed   By: Claudie Revering M.D.   On: 04/09/2019 17:32   Result Date: 04/09/2019 CLINICAL DATA:  Diffuse tremors and black spots on his skin, possibly bruises, for the past 3 days. Gross hematuria for the past 4 days. EXAM: CT CHEST, ABDOMEN AND PELVIS WITHOUT CONTRAST TECHNIQUE: Multidetector CT imaging of the chest, abdomen and pelvis was performed following the standard protocol without IV contrast. COMPARISON:  Portable chest dated 11/17/2018. FINDINGS: CT CHEST FINDINGS Cardiovascular: No significant vascular findings. Normal heart size. No pericardial effusion. Mediastinum/Nodes: No enlarged mediastinal, hilar, or axillary lymph nodes. Thyroid gland, trachea, and esophagus demonstrate no significant findings. Lungs/Pleura: Right lung calcified granulomata. No airspace consolidation or pleural fluid Musculoskeletal: Thoracic spine degenerative changes. CT ABDOMEN PELVIS FINDINGS Hepatobiliary: Diffusely enlarged liver with diffuse low density relative to the spleen. Medium density material filling an otherwise normal appearing gallbladder. Pancreas: Unremarkable. No pancreatic ductal dilatation or surrounding inflammatory changes. Spleen: Normal in size without focal abnormality. Adrenals/Urinary Tract: Adrenal glands are unremarkable. Kidneys are normal, without renal calculi, focal lesion, or hydronephrosis. Bladder is unremarkable. Stomach/Bowel: Stomach is within normal limits. Appendix appears normal. No evidence of bowel wall thickening, distention, or inflammatory changes. Vascular/Lymphatic: No significant vascular findings are present. No enlarged abdominal or pelvic lymph nodes. Reproductive: Mildly enlarged prostate gland. Other: No abdominal wall hernia or abnormality. No abdominopelvic  ascites. Musculoskeletal: Lumbar spine degenerative changes. Small, rounded lucent lesion with a surrounding sclerotic rim in the medial femoral head on the left and small cystic area or synovial pit in the lateral femoral head and neck junction. IMPRESSION: 1. No acute abnormality in the chest, abdomen or pelvis. 2. Hepatomegaly with diffuse hepatic steatosis. 3. Medium density material filling an otherwise normal appearing gallbladder. This is compatible with sludge filling the gallbladder. 4. Mildly prostatic hypertrophy. Electronically Signed: By: Claudie Revering M.D. On: 04/09/2019 16:08   Ct Chest Wo Contrast  Addendum Date: 04/09/2019   ADDENDUM REPORT: 04/09/2019 17:32 ADDENDUM: The patient has bilateral gynecomastia, left greater than right. This was discussed with Sophia Caccavale, PA-C. Electronically Signed   By: Claudie Revering M.D.   On: 04/09/2019 17:32   Result Date: 04/09/2019 CLINICAL DATA:  Diffuse tremors and black spots on his skin, possibly bruises, for the past 3 days. Gross hematuria for the past 4 days. EXAM: CT CHEST, ABDOMEN AND PELVIS WITHOUT CONTRAST TECHNIQUE: Multidetector CT imaging of the chest, abdomen and pelvis was performed following the standard protocol without IV contrast. COMPARISON:  Portable chest dated 11/17/2018. FINDINGS: CT CHEST FINDINGS Cardiovascular: No significant vascular findings. Normal heart size. No pericardial effusion. Mediastinum/Nodes: No enlarged mediastinal, hilar, or axillary lymph nodes. Thyroid gland, trachea, and esophagus demonstrate no significant findings. Lungs/Pleura: Right lung calcified granulomata. No airspace consolidation or pleural fluid Musculoskeletal: Thoracic spine degenerative changes. CT ABDOMEN PELVIS FINDINGS Hepatobiliary: Diffusely enlarged liver with diffuse  low density relative to the spleen. Medium density material filling an otherwise normal appearing gallbladder. Pancreas: Unremarkable. No pancreatic ductal dilatation or  surrounding inflammatory changes. Spleen: Normal in size without focal abnormality. Adrenals/Urinary Tract: Adrenal glands are unremarkable. Kidneys are normal, without renal calculi, focal lesion, or hydronephrosis. Bladder is unremarkable. Stomach/Bowel: Stomach is within normal limits. Appendix appears normal. No evidence of bowel wall thickening, distention, or inflammatory changes. Vascular/Lymphatic: No significant vascular findings are present. No enlarged abdominal or pelvic lymph nodes. Reproductive: Mildly enlarged prostate gland. Other: No abdominal wall hernia or abnormality. No abdominopelvic ascites. Musculoskeletal: Lumbar spine degenerative changes. Small, rounded lucent lesion with a surrounding sclerotic rim in the medial femoral head on the left and small cystic area or synovial pit in the lateral femoral head and neck junction. IMPRESSION: 1. No acute abnormality in the chest, abdomen or pelvis. 2. Hepatomegaly with diffuse hepatic steatosis. 3. Medium density material filling an otherwise normal appearing gallbladder. This is compatible with sludge filling the gallbladder. 4. Mildly prostatic hypertrophy. Electronically Signed: By: Claudie Revering M.D. On: 04/09/2019 16:08   US Abdomen Limited Ruq  Result Date: 04/09/2019 CLINICAL DATA:  Elevated liver function tests and bilirubin. EXAM: ULTRASOUND ABDOMEN LIMITED RIGHT UPPER QUADRANT COMPARISON:  Chest, abdomen and pelvis CT obtained earlier today. FINDINGS: Gallbladder: No gallstones or wall thickening visualized. No sonographic Murphy sign noted by sonographer. Thin internal septation in the gallbladder fundus or minimal amount of pericholecystic fluid or wall edema. On the CT earlier today, this has an appearance compatible with a thin internal septation with no wall edema or pericholecystic fluid seen. Common bile duct: Diameter: 3.6 mm. Liver: Diffusely echogenic. Corresponding marked diffuse low density on the previous CT. Portal vein is  patent on color Doppler imaging with normal direction of blood flow towards the liver. Other: None. IMPRESSION: 1. No acute abnormality. 2. Marked diffuse hepatic steatosis. 3. Thin internal septation in the gallbladder fundus. Electronically Signed   By: Claudie Revering M.D.   On: 04/09/2019 19:32    Results/Tests Pending at Time of Discharge: none  Discharge Medications:  Allergies as of 04/11/2019      Reactions   Pork-derived Products Other (See Comments)   "I have pain in my stomach if I eat this"      Medication List    STOP taking these medications   enoxaparin 40 MG/0.4ML injection Commonly known as: LOVENOX   pantoprazole 40 MG tablet Commonly known as: Protonix   polyvinyl alcohol 1.4 % ophthalmic solution Commonly known as: LIQUIFILM TEARS     TAKE these medications   amLODipine 10 MG tablet Commonly known as: NORVASC Take 1 tablet (10 mg total) by mouth daily. Start taking on: April 12, 2019   atorvastatin 40 MG tablet Commonly known as: LIPITOR Take 1 tablet (40 mg total) by mouth daily at 6 PM.       Discharge Instructions: Please refer to Patient Instructions section of EMR for full details.  Patient was counseled important signs and symptoms that should prompt return to medical care, changes in medications, dietary instructions, activity restrictions, and follow up appointments.   Follow-Up Appointments: Follow-up Information    [facility]. Schedule an appointment as soon as possible for a visit in 3 day(s).   Contact information: [place] [phone]          Kathrene Alu, MD 04/11/2019, 2:56 PM PGY-3, [place]

## 2019-04-11 NOTE — Progress Notes (Signed)
Family Medicine Teaching Service Daily Progress Note Intern Pager: [phone]  Patient name: Micheal Watson Medical record number: [id number] Date of birth: [birth year] Age: 58 y.o. Gender: male  Primary Care Provider: Patient, No Pcp Per Consultants: GI Code Status: full  Pt Overview and Major Events to Date:  Admitted 9/4 9/5 resolution of AKI, trending LFTs  Assessment and Plan:  Alcoholic hepatitis Maddrey score <32, indicating reassuring short term prognosis and no need for corticosteroids.  Trending LFTs per GI recommendations.  Discussed with patient the importance of cessation of alcohol, and he is agreeable to this and thinks that he can do this since he has also quit smoking recently.  Last drink was on Saturday, August 29, making DTs less likely.  Will await their recommendations for timing of discharge.  Treating with lactulose to prevent encephalopathy. -Continue lactulose and titrate to 3 bowel movements per day -Alcohol cessation counseling -Continue thiamine -Continue folic acid -Continue MVI -Regular diet supplemented by Ensure -CIWA protocol without ativan (nurse must page for ativan)   Protein calorie malnutrition Likely acute on chronic due to recently reduced p.o. intake.  Chronic protein calorie malnutrition often seen in those with alcoholic liver disease.  Albumin low at 2.8, total protein low at 6.2 on CMP.  Recorded p.o. intake of 720 mL over last 24 hours -Nutrition following, patient consuming Ensure 2 times daily between meals  Hypertension Although patient does not have a history of hypertension, he has not seen a doctor in 13 years.  Blood pressure has been elevated throughout admission, last reading 140/96. -Start amlodipine 10 mg nightly  Hypertriglyceridemia and hyperlipidemia Triglycerides are 499 on fasting lipid panel, which can often be caused by hepatocellular disease.  LDL is also elevated to 157.  Patient is amenable to starting a  medication once daily for his cholesterol. -Alcohol cessation counseling -Start atorvastatin 40 mg   Lesion on skin suspicious for cutaneous carcinoma A lesion has been present for more than a month on his right anterior shoulder that has asymmetric borders, variegated colors, not ulcerated, about 1 cm in size.   Recommend biopsy, can be done in the outpatient setting.   -Care management consult to set up PCP  Difficulty sleeping Patient reports that he continues to not sleep very well. -Continue trazodone 50 mg nightly as needed -Melatonin 5 mg FEN/GI: Regular diet PPx: SCDs  Disposition: home after continued workup  Subjective: Patient reports that his appetite has improved greatly, and his tremors are somewhat better as well.  He is amenable to finding a doctor and starting medications for his cholesterol and hypertension.  He is also motivated to quit drinking and says that he has confidence that he can do this.  Objective: Temp:  [98.5 F (36.9 C)-99.4 F (37.4 C)] 98.6 F (37 C) (09/06 0738) Pulse Rate:  [74-90] 90 (09/06 0738) Resp:  [13-18] 13 (09/06 0738) BP: (136-147)/(96-99) 140/96 (09/06 0738) SpO2:  [95 %-99 %] 95 % (09/06 0738) Physical Exam: General: Jaundiced, pleasant gentleman lying comfortably in bed eating breakfast, mild tremor noted Cardiovascular: RRR, no MRG Respiratory: CTA B, comfortable work of breathing on room air Abdomen: Abdomen softer on palpation, normal active bowel sounds, no ascites Extremities: Areas of ecchymosis most pronounced on left antecubital area, no pedal edema  Laboratory: Recent Labs  Lab 04/09/19 1317 04/09/19 1635 04/10/19 0423 04/11/19 0621  WBC 10.0  --  8.4 10.0  HGB 13.0  --  12.0* 12.7*  HCT 39.7  --  35.3* 37.0*  PLT PLATELET CLUMPS NOTED ON SMEAR, COUNT APPEARS DECREASED 106* 95* 118*   Recent Labs  Lab 04/09/19 1317 04/09/19 1525 04/10/19 0423 04/11/19 0621  NA 134*  --  134* 137  K 3.8  --  3.2* 3.5  CL  97*  --  100 100  CO2 21*  --  23 27  BUN <5*  --  <5* 5*  CREATININE 1.39*  --  0.93 0.82  CALCIUM 9.0  --  8.4* 8.8*  PROT  --  6.5 5.9* 6.2*  BILITOT  --  6.8* 7.4* 5.2*  ALKPHOS  --  230* 201* 215*  ALT  --  126* 104* 96*  AST  --  309* 237* 180*  GLUCOSE 124*  --  123* 105*    Imaging/Diagnostic Tests: Ct Abdomen Pelvis Wo Contrast  Addendum Date: 04/09/2019   ADDENDUM REPORT: 04/09/2019 17:32 ADDENDUM: The patient has bilateral gynecomastia, left greater than right. This was discussed with Sophia Caccavale, PA-C. Electronically Signed   By: Claudie Revering M.D.   On: 04/09/2019 17:32   Result Date: 04/09/2019 CLINICAL DATA:  Diffuse tremors and black spots on his skin, possibly bruises, for the past 3 days. Gross hematuria for the past 4 days. EXAM: CT CHEST, ABDOMEN AND PELVIS WITHOUT CONTRAST TECHNIQUE: Multidetector CT imaging of the chest, abdomen and pelvis was performed following the standard protocol without IV contrast. COMPARISON:  Portable chest dated 11/17/2018. FINDINGS: CT CHEST FINDINGS Cardiovascular: No significant vascular findings. Normal heart size. No pericardial effusion. Mediastinum/Nodes: No enlarged mediastinal, hilar, or axillary lymph nodes. Thyroid gland, trachea, and esophagus demonstrate no significant findings. Lungs/Pleura: Right lung calcified granulomata. No airspace consolidation or pleural fluid Musculoskeletal: Thoracic spine degenerative changes. CT ABDOMEN PELVIS FINDINGS Hepatobiliary: Diffusely enlarged liver with diffuse low density relative to the spleen. Medium density material filling an otherwise normal appearing gallbladder. Pancreas: Unremarkable. No pancreatic ductal dilatation or surrounding inflammatory changes. Spleen: Normal in size without focal abnormality. Adrenals/Urinary Tract: Adrenal glands are unremarkable. Kidneys are normal, without renal calculi, focal lesion, or hydronephrosis. Bladder is unremarkable. Stomach/Bowel: Stomach is within  normal limits. Appendix appears normal. No evidence of bowel wall thickening, distention, or inflammatory changes. Vascular/Lymphatic: No significant vascular findings are present. No enlarged abdominal or pelvic lymph nodes. Reproductive: Mildly enlarged prostate gland. Other: No abdominal wall hernia or abnormality. No abdominopelvic ascites. Musculoskeletal: Lumbar spine degenerative changes. Small, rounded lucent lesion with a surrounding sclerotic rim in the medial femoral head on the left and small cystic area or synovial pit in the lateral femoral head and neck junction. IMPRESSION: 1. No acute abnormality in the chest, abdomen or pelvis. 2. Hepatomegaly with diffuse hepatic steatosis. 3. Medium density material filling an otherwise normal appearing gallbladder. This is compatible with sludge filling the gallbladder. 4. Mildly prostatic hypertrophy. Electronically Signed: By: Claudie Revering M.D. On: 04/09/2019 16:08   Ct Chest Wo Contrast  Addendum Date: 04/09/2019   ADDENDUM REPORT: 04/09/2019 17:32 ADDENDUM: The patient has bilateral gynecomastia, left greater than right. This was discussed with Sophia Caccavale, PA-C. Electronically Signed   By: Claudie Revering M.D.   On: 04/09/2019 17:32   Result Date: 04/09/2019 CLINICAL DATA:  Diffuse tremors and black spots on his skin, possibly bruises, for the past 3 days. Gross hematuria for the past 4 days. EXAM: CT CHEST, ABDOMEN AND PELVIS WITHOUT CONTRAST TECHNIQUE: Multidetector CT imaging of the chest, abdomen and pelvis was performed following the standard protocol without IV contrast. COMPARISON:  Portable chest dated 11/17/2018.  FINDINGS: CT CHEST FINDINGS Cardiovascular: No significant vascular findings. Normal heart size. No pericardial effusion. Mediastinum/Nodes: No enlarged mediastinal, hilar, or axillary lymph nodes. Thyroid gland, trachea, and esophagus demonstrate no significant findings. Lungs/Pleura: Right lung calcified granulomata. No airspace  consolidation or pleural fluid Musculoskeletal: Thoracic spine degenerative changes. CT ABDOMEN PELVIS FINDINGS Hepatobiliary: Diffusely enlarged liver with diffuse low density relative to the spleen. Medium density material filling an otherwise normal appearing gallbladder. Pancreas: Unremarkable. No pancreatic ductal dilatation or surrounding inflammatory changes. Spleen: Normal in size without focal abnormality. Adrenals/Urinary Tract: Adrenal glands are unremarkable. Kidneys are normal, without renal calculi, focal lesion, or hydronephrosis. Bladder is unremarkable. Stomach/Bowel: Stomach is within normal limits. Appendix appears normal. No evidence of bowel wall thickening, distention, or inflammatory changes. Vascular/Lymphatic: No significant vascular findings are present. No enlarged abdominal or pelvic lymph nodes. Reproductive: Mildly enlarged prostate gland. Other: No abdominal wall hernia or abnormality. No abdominopelvic ascites. Musculoskeletal: Lumbar spine degenerative changes. Small, rounded lucent lesion with a surrounding sclerotic rim in the medial femoral head on the left and small cystic area or synovial pit in the lateral femoral head and neck junction. IMPRESSION: 1. No acute abnormality in the chest, abdomen or pelvis. 2. Hepatomegaly with diffuse hepatic steatosis. 3. Medium density material filling an otherwise normal appearing gallbladder. This is compatible with sludge filling the gallbladder. 4. Mildly prostatic hypertrophy. Electronically Signed: By: Claudie Revering M.D. On: 04/09/2019 16:08   US Abdomen Limited Ruq  Result Date: 04/09/2019 CLINICAL DATA:  Elevated liver function tests and bilirubin. EXAM: ULTRASOUND ABDOMEN LIMITED RIGHT UPPER QUADRANT COMPARISON:  Chest, abdomen and pelvis CT obtained earlier today. FINDINGS: Gallbladder: No gallstones or wall thickening visualized. No sonographic Murphy sign noted by sonographer. Thin internal septation in the gallbladder fundus or  minimal amount of pericholecystic fluid or wall edema. On the CT earlier today, this has an appearance compatible with a thin internal septation with no wall edema or pericholecystic fluid seen. Common bile duct: Diameter: 3.6 mm. Liver: Diffusely echogenic. Corresponding marked diffuse low density on the previous CT. Portal vein is patent on color Doppler imaging with normal direction of blood flow towards the liver. Other: None. IMPRESSION: 1. No acute abnormality. 2. Marked diffuse hepatic steatosis. 3. Thin internal septation in the gallbladder fundus. Electronically Signed   By: Claudie Revering M.D.   On: 04/09/2019 19:32     Kathrene Alu, MD 04/11/2019, 8:16 AM PGY-3, [place] Intern pager: [phone], text pages welcome

## 2019-04-11 NOTE — Care Management (Signed)
Patient provided with number for [facility] to call to establish a PCP. Provided with Good Rx card to assist with Rxs cost.

## 2019-04-11 NOTE — Progress Notes (Signed)
Patient seen and examined.  He seems happy and in good spirits.  He still has a slight tremor.  There is bilateral flank tympany, suggesting the absence of ascites; his abdomen is a little bit protuberant, perhaps sort of a "beer belly."  No peripheral edema.  Hepatitis serologies came back negative.  Agree with plan for discharge.  His prognosis will be covered primarily by the degree to which he avoids alcohol in the future.  He would be a candidate for colon cancer screening, for which a referral to my office could be made, and/or, perhaps more expediently, he could have annual FIT testing.  Cleotis Nipper, M.D. Pager [phone] If no answer or after 5 PM call [phone]

## 2019-04-13 LAB — FOLATE RBC
Folate, Hemolysate: 357 ng/mL
Folate, RBC: 1029 ng/mL (ref >498)
Hematocrit: 34.7 % — ABNORMAL LOW (ref 37.5–51.0)

## 2019-04-14 LAB — PATHOLOGIST SMEAR REVIEW: Path Review: 9082020

## 2019-05-03 ENCOUNTER — Other Ambulatory Visit: Payer: Self-pay | Admitting: Family Medicine

## 2019-11-21 ENCOUNTER — Emergency Department (HOSPITAL_COMMUNITY): Payer: Self-pay

## 2019-11-21 ENCOUNTER — Other Ambulatory Visit: Payer: Self-pay

## 2019-11-21 ENCOUNTER — Observation Stay (HOSPITAL_COMMUNITY)
Admission: EM | Admit: 2019-11-21 | Discharge: 2019-11-23 | Disposition: A | Discharge status: Home / Self Care | Payer: Self-pay | Attending: General Surgery | Admitting: General Surgery

## 2019-11-21 ENCOUNTER — Encounter (HOSPITAL_COMMUNITY): Payer: Self-pay | Admitting: Emergency Medicine

## 2019-11-21 DIAGNOSIS — E785 Hyperlipidemia, unspecified: Secondary | ICD-10-CM | POA: Insufficient documentation

## 2019-11-21 DIAGNOSIS — Z20822 Contact with and (suspected) exposure to covid-19: Secondary | ICD-10-CM | POA: Insufficient documentation

## 2019-11-21 DIAGNOSIS — R7303 Prediabetes: Secondary | ICD-10-CM | POA: Insufficient documentation

## 2019-11-21 DIAGNOSIS — Z79899 Other long term (current) drug therapy: Secondary | ICD-10-CM | POA: Insufficient documentation

## 2019-11-21 DIAGNOSIS — Z87891 Personal history of nicotine dependence: Secondary | ICD-10-CM | POA: Insufficient documentation

## 2019-11-21 DIAGNOSIS — I10 Essential (primary) hypertension: Secondary | ICD-10-CM | POA: Insufficient documentation

## 2019-11-21 DIAGNOSIS — K812 Acute cholecystitis with chronic cholecystitis: Principal | ICD-10-CM | POA: Insufficient documentation

## 2019-11-21 DIAGNOSIS — R1010 Upper abdominal pain, unspecified: Secondary | ICD-10-CM

## 2019-11-21 DIAGNOSIS — K810 Acute cholecystitis: Secondary | ICD-10-CM | POA: Diagnosis present

## 2019-11-21 DIAGNOSIS — K821 Hydrops of gallbladder: Secondary | ICD-10-CM | POA: Insufficient documentation

## 2019-11-21 HISTORY — DX: Inflammatory liver disease, unspecified: K75.9

## 2019-11-21 HISTORY — DX: Prediabetes: R73.03

## 2019-11-21 HISTORY — DX: Pneumonia, unspecified organism: J18.9

## 2019-11-21 HISTORY — DX: Essential (primary) hypertension: I10

## 2019-11-21 HISTORY — DX: Hyperlipidemia, unspecified: E78.5

## 2019-11-21 LAB — URINALYSIS, ROUTINE W REFLEX MICROSCOPIC
Bacteria, UA: NONE SEEN
Bilirubin Urine: NEGATIVE
Glucose, UA: NEGATIVE mg/dL
Ketones, ur: NEGATIVE mg/dL
Leukocytes,Ua: NEGATIVE
Nitrite: NEGATIVE
Protein, ur: NEGATIVE mg/dL
Specific Gravity, Urine: 1.016 (ref 1.005–1.030)
pH: 6 (ref 5.0–8.0)

## 2019-11-21 LAB — PROTIME-INR
INR: 1.3 — ABNORMAL HIGH (ref 0.8–1.2)
Prothrombin Time: 15.9 seconds — ABNORMAL HIGH (ref 11.4–15.2)

## 2019-11-21 LAB — COMPREHENSIVE METABOLIC PANEL
ALT: 42 U/L (ref 0–44)
AST: 83 U/L — ABNORMAL HIGH (ref 15–41)
Albumin: 3.3 g/dL — ABNORMAL LOW (ref 3.5–5.0)
Alkaline Phosphatase: 168 U/L — ABNORMAL HIGH (ref 38–126)
Anion gap: 14 (ref 5–15)
BUN: 5 mg/dL — ABNORMAL LOW (ref 6–20)
CO2: 23 mmol/L (ref 22–32)
Calcium: 8.7 mg/dL — ABNORMAL LOW (ref 8.9–10.3)
Chloride: 99 mmol/L (ref 98–111)
Creatinine, Ser: 0.74 mg/dL (ref 0.61–1.24)
GFR calc Af Amer: >60 mL/min (ref >60)
GFR calc non Af Amer: >60 mL/min (ref >60)
Glucose, Bld: 161 mg/dL — ABNORMAL HIGH (ref 70–99)
Potassium: 3.5 mmol/L (ref 3.5–5.1)
Sodium: 136 mmol/L (ref 135–145)
Total Bilirubin: 1.6 mg/dL — ABNORMAL HIGH (ref 0.3–1.2)
Total Protein: 7.7 g/dL (ref 6.5–8.1)

## 2019-11-21 LAB — CBC
HCT: 46.4 % (ref 39.0–52.0)
Hemoglobin: 15.3 g/dL (ref 13.0–17.0)
MCH: 33.8 pg (ref 26.0–34.0)
MCHC: 33 g/dL (ref 30.0–36.0)
MCV: 102.7 fL — ABNORMAL HIGH (ref 80.0–100.0)
Platelets: 195 10*3/uL (ref 150–400)
RBC: 4.52 MIL/uL (ref 4.22–5.81)
RDW: 13 % (ref 11.5–15.5)
WBC: 15.8 10*3/uL — ABNORMAL HIGH (ref 4.0–10.5)
nRBC: 0 % (ref 0.0–0.2)

## 2019-11-21 LAB — LIPASE, BLOOD: Lipase: 44 U/L (ref 11–51)

## 2019-11-21 LAB — RESPIRATORY PANEL BY RT PCR (FLU A&B, COVID)
Influenza A by PCR: NEGATIVE
Influenza B by PCR: NEGATIVE
SARS Coronavirus 2 by RT PCR: NEGATIVE

## 2019-11-21 LAB — TROPONIN I (HIGH SENSITIVITY): Troponin I (High Sensitivity): 8 ng/L (ref <18)

## 2019-11-21 MED ORDER — DIPHENHYDRAMINE HCL 25 MG PO CAPS: 25.0000 mg | ORAL_CAPSULE | Freq: Four times a day (QID) | ORAL | Status: DC | PRN

## 2019-11-21 MED ORDER — POTASSIUM CHLORIDE IN NACL 20-0.9 MEQ/L-% IV SOLN
INTRAVENOUS | Status: DC
  Filled 2019-11-21 (×4): qty 1000

## 2019-11-21 MED ORDER — HYDROMORPHONE HCL 1 MG/ML IJ SOLN
1.0000 mg | Freq: Once | INTRAMUSCULAR | Status: AC
  Administered 2019-11-21: 1 mg via INTRAVENOUS
  Filled 2019-11-21: qty 1

## 2019-11-21 MED ORDER — ATORVASTATIN CALCIUM 40 MG PO TABS
40.0000 mg | ORAL_TABLET | Freq: Every day | ORAL | Status: DC
  Administered 2019-11-22: 40 mg via ORAL
  Filled 2019-11-21: qty 1

## 2019-11-21 MED ORDER — ONDANSETRON HCL 4 MG/2ML IJ SOLN: 4.0000 mg | Freq: Four times a day (QID) | INTRAMUSCULAR | Status: DC | PRN

## 2019-11-21 MED ORDER — DIPHENHYDRAMINE HCL 50 MG/ML IJ SOLN: 25.0000 mg | Freq: Four times a day (QID) | INTRAMUSCULAR | Status: DC | PRN

## 2019-11-21 MED ORDER — IOHEXOL 300 MG/ML  SOLN
100.0000 mL | Freq: Once | INTRAMUSCULAR | Status: AC | PRN
  Administered 2019-11-21: 100 mL via INTRAVENOUS

## 2019-11-21 MED ORDER — ONDANSETRON 4 MG PO TBDP: 4.0000 mg | ORAL_TABLET | Freq: Four times a day (QID) | ORAL | Status: DC | PRN

## 2019-11-21 MED ORDER — ACETAMINOPHEN 650 MG RE SUPP: 650.0000 mg | Freq: Four times a day (QID) | RECTAL | Status: DC | PRN

## 2019-11-21 MED ORDER — HYDROMORPHONE HCL 1 MG/ML IJ SOLN: 0.5000 mg | Freq: Once | INTRAMUSCULAR | Status: DC

## 2019-11-21 MED ORDER — AMLODIPINE BESYLATE 10 MG PO TABS
10.0000 mg | ORAL_TABLET | Freq: Every day | ORAL | Status: DC
  Administered 2019-11-21 – 2019-11-23 (×3): 10 mg via ORAL
  Filled 2019-11-21: qty 1
  Filled 2019-11-21: qty 2
  Filled 2019-11-21: qty 1

## 2019-11-21 MED ORDER — ACETAMINOPHEN 325 MG PO TABS: 650.0000 mg | ORAL_TABLET | Freq: Four times a day (QID) | ORAL | Status: DC | PRN

## 2019-11-21 MED ORDER — SODIUM CHLORIDE 0.9 % IV SOLN
2.0000 g | INTRAVENOUS | Status: DC
  Administered 2019-11-21: 2 g via INTRAVENOUS
  Filled 2019-11-21 (×2): qty 20

## 2019-11-21 MED ORDER — ACETAMINOPHEN 500 MG PO TABS
1000.0000 mg | ORAL_TABLET | ORAL | Status: AC
  Administered 2019-11-22: 1000 mg via ORAL
  Filled 2019-11-21: qty 2

## 2019-11-21 MED ORDER — SODIUM CHLORIDE 0.9 % IV BOLUS
1000.0000 mL | Freq: Once | INTRAVENOUS | Status: AC
  Administered 2019-11-21: 1000 mL via INTRAVENOUS

## 2019-11-21 MED ORDER — GABAPENTIN 300 MG PO CAPS
300.0000 mg | ORAL_CAPSULE | ORAL | Status: AC
  Administered 2019-11-22: 300 mg via ORAL
  Filled 2019-11-21: qty 1

## 2019-11-21 MED ORDER — SODIUM CHLORIDE 0.9% FLUSH
3.0000 mL | Freq: Once | INTRAVENOUS | Status: AC
  Administered 2019-11-21: 3 mL via INTRAVENOUS

## 2019-11-21 MED ORDER — MORPHINE SULFATE (PF) 4 MG/ML IV SOLN
3.0000 mg | Freq: Once | INTRAVENOUS | Status: AC
  Administered 2019-11-21: 3 mg via INTRAVENOUS
  Filled 2019-11-21: qty 1

## 2019-11-21 MED ORDER — HYDROMORPHONE HCL 1 MG/ML IJ SOLN
1.0000 mg | INTRAMUSCULAR | Status: DC | PRN
  Administered 2019-11-21 – 2019-11-22 (×2): 1 mg via INTRAVENOUS
  Filled 2019-11-21 (×2): qty 1

## 2019-11-21 MED ORDER — TECHNETIUM TC 99M MEBROFENIN IV KIT
5.4000 | PACK | Freq: Once | INTRAVENOUS | Status: AC | PRN
  Administered 2019-11-21: 5.4 via INTRAVENOUS

## 2019-11-21 NOTE — ED Triage Notes (Signed)
Per EMS, pt from home, began to have generalized abdominal pain tonight that radiates to his left flank.  Pt c/o nausea was given 4mg  Zofran by EMS, no relief.    20G L hand 150/90 HR 90 95% RA CBG 168

## 2019-11-21 NOTE — H&P (Signed)
Micheal Watson is an 59 y.o. male.   Chief Complaint: RUQ abdominal pain HPI: This is a 59 year old male with a history of heavy EtOH use presents with abdominal pain since last night.  Some shortness of breath.  He had a similar episode in 2020 with abnormal LFT's with T. Bili up to 7.4.  He was given a diagnosis of alcoholic hepatitis.  No stones were seen on [place] but sludge was noted on CT scan.  He was evaluated by EDP today.  [place] was unremarkable, but CT scan Showed pericholecystic edema.  HIDA scan showed no filling of the gallbladder, but normal hepatic uptake.  He continues to have pain.  PMH - HTN, Prediabetes Hyperlipidemia Hx of EtOH hepatitis  History reviewed. No pertinent surgical history.  Family History  Problem Relation Age of Onset  . CVA Father    Social History:  reports that he quit smoking about 13 months ago. His smoking use included cigarettes. He smoked 0.30 packs per day. He has never used smokeless tobacco. He reports current alcohol use of about 2.0 standard drinks of alcohol per week. No history on file for drug.  Allergies:  Allergies  Allergen Reactions  . Pork-Derived Products Other (See Comments)    "I have pain in my stomach if I eat this"    Prior to Admission medications   Medication Sig Start Date End Date Taking? Authorizing Provider  amLODipine (NORVASC) 10 MG tablet TAKE 1 TABLET BY MOUTH EVERY DAY 05/03/19   Kathrene Alu, MD  atorvastatin (LIPITOR) 40 MG tablet TAKE 1 TABLET (40 MG TOTAL) BY MOUTH DAILY AT 6 PM. 05/03/19   Kathrene Alu, MD     Results for orders placed or performed during the hospital encounter of 11/21/19 (from the past 48 hour(s))  Urinalysis, Routine w reflex microscopic     Status: Abnormal   Collection Time: 11/21/19 12:44 AM  Result Value Ref Range   Color, Urine YELLOW YELLOW   APPearance CLEAR CLEAR   Specific Gravity, Urine 1.016 1.005 - 1.030   pH 6.0 5.0 - 8.0   Glucose, UA NEGATIVE NEGATIVE mg/dL    Hgb urine dipstick SMALL (A) NEGATIVE   Bilirubin Urine NEGATIVE NEGATIVE   Ketones, ur NEGATIVE NEGATIVE mg/dL   Protein, ur NEGATIVE NEGATIVE mg/dL   Nitrite NEGATIVE NEGATIVE   Leukocytes,Ua NEGATIVE NEGATIVE   RBC / HPF 0-5 0 - 5 RBC/hpf   WBC, UA 0-5 0 - 5 WBC/hpf   Bacteria, UA NONE SEEN NONE SEEN   Mucus PRESENT     Comment: Performed at [facility], [address] [place], [place] [address]  Lipase, blood     Status: None   Collection Time: 11/21/19 12:46 AM  Result Value Ref Range   Lipase 44 11 - 51 U/L    Comment: Performed at [facility] [address], [place], [place] [address]  Comprehensive metabolic panel     Status: Abnormal   Collection Time: 11/21/19 12:46 AM  Result Value Ref Range   Sodium 136 135 - 145 mmol/L   Potassium 3.5 3.5 - 5.1 mmol/L   Chloride 99 98 - 111 mmol/L   CO2 23 22 - 32 mmol/L   Glucose, Bld 161 (H) 70 - 99 mg/dL    Comment: Glucose reference range applies only to samples taken after fasting for at least 8 hours.   BUN 5 (L) 6 - 20 mg/dL   Creatinine, Ser 0.74 0.61 -  1.24 mg/dL   Calcium 8.7 (L) 8.9 - 10.3 mg/dL   Total Protein 7.7 6.5 - 8.1 g/dL   Albumin 3.3 (L) 3.5 - 5.0 g/dL   AST 83 (H) 15 - 41 U/L   ALT 42 0 - 44 U/L   Alkaline Phosphatase 168 (H) 38 - 126 U/L   Total Bilirubin 1.6 (H) 0.3 - 1.2 mg/dL   GFR calc non Af Amer >60 >60 mL/min   GFR calc Af Amer >60 >60 mL/min   Anion gap 14 5 - 15    Comment: Performed at [facility] [address], [place], [place] [address]  CBC     Status: Abnormal   Collection Time: 11/21/19 12:46 AM  Result Value Ref Range   WBC 15.8 (H) 4.0 - 10.5 K/uL   RBC 4.52 4.22 - 5.81 MIL/uL   Hemoglobin 15.3 13.0 - 17.0 g/dL   HCT 46.4 39.0 - 52.0 %   MCV 102.7 (H) 80.0 - 100.0 fL   MCH 33.8 26.0 - 34.0 pg   MCHC 33.0 30.0 - 36.0 g/dL   RDW 13.0 11.5 - 15.5 %   Platelets 195 150 - 400 K/uL   nRBC 0.0 0.0 - 0.2 %    Comment: Performed at [facility], [place] [address], [place], [place] [address]  Troponin I (High Sensitivity)     Status: None   Collection Time: 11/21/19  8:57 AM  Result Value Ref Range   Troponin I (High Sensitivity) 8 <18 ng/L    Comment: (NOTE) Elevated high sensitivity troponin I (hsTnI) values and significant  changes across serial measurements may suggest ACS but many other  chronic and acute conditions are known to elevate hsTnI results.  Refer to the "Links" section for chest pain algorithms and additional  guidance. Performed at [facility], [place] [address], [place], [place] [address]    NM Hepatobiliary Liver Func  Result Date: 11/21/2019 CLINICAL DATA:  Cholecystitis.  Generalized abdominal pain. EXAM: NUCLEAR MEDICINE HEPATOBILIARY IMAGING TECHNIQUE: Sequential images of the abdomen were obtained out to 60 minutes following intravenous administration of radiopharmaceutical. RADIOPHARMACEUTICALS:  5.4 mCi Tc-47m  Choletec IV COMPARISON:  CT and ultrasound earlier this day. FINDINGS: Prompt uptake and biliary excretion of activity by the liver is seen. Gallbladder activity not visualized. There is activity within the common bile duct and small bowel. Patient was administered 3 mg morphine IV. Imaging obtained for 30 minutes after morphine injection. Gallbladder is not visualized after morphine injection. IMPRESSION: 1. Gallbladder is not visualized either before or after morphine injection compatible with obstruction of the cystic duct and acute cholecystitis. 2. Normal hepatic uptake. There is activity within the common bile duct in small bowel which confirms common bile duct patency. Electronically Signed   By: Keith Rake M.D.   On: 11/21/2019 17:34   CT ABDOMEN PELVIS W CONTRAST  Result Date: 11/21/2019 CLINICAL DATA:  Generalized abdominal pain radiating to left flank since last night. Nausea. EXAM: CT ABDOMEN AND PELVIS WITH CONTRAST TECHNIQUE: Multidetector CT imaging of the abdomen and pelvis was performed  using the standard protocol following bolus administration of intravenous contrast. CONTRAST:  140mL OMNIPAQUE IOHEXOL 300 MG/ML  SOLN COMPARISON:  Abdominal ultrasound of earlier today. Prior CT of 04/09/2019. FINDINGS: Lower chest: Motion degradation at the lung bases. Mild nodularity at the anterior right lung base is unchanged and likely due to tiny granulomas. Mild cardiomegaly, without pericardial or pleural effusion. Hepatobiliary: Mild motion continuing  into the upper abdomen. Moderate hepatic steatosis and marked hepatomegaly at 22.5 cm craniocaudal. Caudate and lateral segment left liver lobe prominence, nonspecific. Vague area of hypoattenuation within the right hepatic lobe at 1.6 cm on 34/3 may represent more focal steatosis. The gallbladder is distended. No calcified stones. Subtle pericholecystic edema, including on 43/3. No biliary duct dilatation. Pancreas: No pancreatic duct dilatation or focal pancreatic abnormality. Spleen: Normal in size, without focal abnormality. Adrenals/Urinary Tract: Normal adrenal glands. Normal kidneys, without hydronephrosis. Normal urinary bladder. Stomach/Bowel: Normal stomach, without wall thickening. Normal colon, appendix, and terminal ileum. Normal small bowel. Vascular/Lymphatic: Aortic atherosclerosis. No abdominopelvic adenopathy. Reproductive: Normal prostate. Other: Trace upper pelvic fluid, including on 74/3. No free intraperitoneal air. Musculoskeletal: Presumed degenerative cyst in the left femoral head. Heterotopic ossification about the right iliac is unchanged. IMPRESSION: 1. Mildly motion degraded exam. 2. Gallbladder distension with edema, felt to be centered about the gallbladder. Given absence of stones on ultrasound, acalculous cholecystitis is a consideration and nuclear medicine hepatobiliary study should be considered. Less likely explanations for right upper quadrant edema, including focal pancreatitis, warrant laboratory correlation. 3. Hepatic  steatosis and marked hepatomegaly. Caudate and lateral segment left liver lobe prominence is nonspecific. Correlate with risk factors for mild cirrhosis. 4. More focal hypoattenuation in the right hepatic lobe may simply represent focal steatosis. If the patient is eventually diagnosed with cirrhosis, consider nonemergent outpatient pre and post contrast abdominal MRI. 5. Trace upper pelvic fluid, presumably secondary 6. Aortic Atherosclerosis (ICD10-I70.0). Electronically Signed   By: Abigail Miyamoto M.D.   On: 11/21/2019 12:00   DG Chest Portable 1 View  Result Date: 11/21/2019 CLINICAL DATA:  Chest and abdominal pain EXAM: PORTABLE CHEST 1 VIEW COMPARISON:  11/17/2018 chest radiograph. FINDINGS: Stable cardiomediastinal silhouette with top-normal heart size. No pneumothorax. No right pleural effusion. Stable blunting of the left costophrenic angle. No pulmonary edema. No acute consolidative airspace disease. IMPRESSION: Chronic blunting of the left costophrenic angle, favor scarring or atelectasis. Otherwise no active disease in the chest. Electronically Signed   By: Ilona Sorrel M.D.   On: 11/21/2019 09:58   US Abdomen Limited RUQ  Result Date: 11/21/2019 CLINICAL DATA:  Abdominal pain since yesterday. EXAM: ULTRASOUND ABDOMEN LIMITED RIGHT UPPER QUADRANT COMPARISON:  04/09/2019 and the CT of 04/09/2019 FINDINGS: Gallbladder: No gallstones or wall thickening visualized. No sonographic Murphy sign noted by sonographer. Common bile duct: Diameter: Normal, 3 mm. Liver: Moderately increased in echogenicity with mild heterogeneity. Portal vein is patent on color Doppler imaging with normal direction of blood flow towards the liver. Other: None. IMPRESSION: 1.  No acute process or explanation for abdominal pain. 2. Heterogeneous hepatic steatosis. Electronically Signed   By: Abigail Miyamoto M.D.   On: 11/21/2019 09:57    Review of Systems  HENT: Negative for ear discharge, ear pain, hearing loss and tinnitus.    Eyes: Negative for photophobia and pain.  Respiratory: Negative for cough and shortness of breath.   Cardiovascular: Negative for chest pain.  Gastrointestinal: Positive for abdominal pain. Negative for nausea and vomiting.  Genitourinary: Negative for dysuria, flank pain, frequency and urgency.  Musculoskeletal: Negative for back pain, myalgias and neck pain.  Neurological: Negative for dizziness and headaches.  Hematological: Does not bruise/bleed easily.  Psychiatric/Behavioral: The patient is not nervous/anxious.     Blood pressure (!) 141/83, pulse 77, temperature 98.7 F (37.1 C), temperature source Oral, resp. rate (!) 24, SpO2 96 %. Physical Exam  Constitutional:  WDWN in NAD, conversant, no  obvious deformities; lying in bed comfortably Eyes:  Pupils equal, round; sclera anicteric; moist conjunctiva; no lid lag HENT:  Oral mucosa moist; good dentition  Neck:  No masses palpated, trachea midline; no thyromegaly Lungs:  CTA bilaterally; normal respiratory effort CV:  Regular rate and rhythm; no murmurs; extremities well-perfused with no edema Abd:  +bowel sounds, soft, protuberant with some RUQ tenderness Musc:  Unable to assess gait; no apparent clubbing or cyanosis in extremities Lymphatic:  No palpable cervical or axillary lymphadenopathy Skin:  Warm, dry; no sign of jaundice Psychiatric - alert and oriented x 4; calm mood and affect  Assessment/Plan Probable acute cholecystitis  Admit for IV antibiotics, hydration NPO Probable laparoscopic cholecystectomy with cholangiogram tomorrow by Dr. [place] Silos.  Will discuss further in AM after rechecking labs.  Maia Petties, MD 11/21/2019, 7:03 PM

## 2019-11-21 NOTE — ED Notes (Signed)
Patient transported to US 

## 2019-11-21 NOTE — ED Provider Notes (Addendum)
HIDA scan consistent with acute cholecystitis.  Discussed with Dr. Georgette Dover from general surgery.  He is going to come and see the patient for admission.  Have ordered an INR for him and ordered a rapid COVID-19 test.  Patient still tender to palpation in the right upper quadrant.  HIDA scan also showed that the common bile duct appears to be patent.   Fredia Sorrow, MD 11/21/19 [place]    Fredia Sorrow, MD 11/21/19 1925

## 2019-11-21 NOTE — ED Provider Notes (Signed)
[facility] EMERGENCY DEPARTMENT Provider Note   CSN: [id number] Arrival date & time: 11/21/19  0024     History Chief Complaint  Patient presents with  . Abdominal Pain    Micheal Watson is a 59 y.o. male.  HPI 59 year old male presents with upper abdominal/lower chest pain.  History is taken with the help of the Spanish interpreter line.  The patient developed pain last night before going to bed.  Has been having severe pain that feels sharp.  No pain in his back.  It has caused him to be short of breath.  No vomiting or diarrhea.  No fevers.  He was admitted and the fall 2020 but states this pain is not similar.   History reviewed. No pertinent past medical history.  Patient Active Problem List   Diagnosis Date Noted  . Protein-calorie malnutrition ([place]) 04/11/2019  . Hyperammonemia ([place]) 04/09/2019  . Elevated LFTs   . Gynecomastia, male   . Hyperbilirubinemia   . Tremor of unknown origin   . COVID-19 virus infection 11/27/2018  . Acute respiratory disease due to COVID-19 virus 11/27/2018  . SIRS (systemic inflammatory response syndrome) ([place]) 11/25/2018  . Acute on chronic respiratory failure with hypoxia ([place]) 11/21/2018  . Suspected COVID-19 virus infection 11/17/2018    History reviewed. No pertinent surgical history.     Family History  Problem Relation Age of Onset  . CVA Father     Social History   Tobacco Use  . Smoking status: Former Smoker    Packs/day: 0.30    Types: Cigarettes    Quit date: 10/08/2018    Years since quitting: 1.1  . Smokeless tobacco: Never Used  Substance Use Topics  . Alcohol use: Yes    Alcohol/week: 2.0 standard drinks    Types: 2 Cans of beer per week  . Drug use: Not on file    Home Medications Prior to Admission medications   Medication Sig Start Date End Date Taking? Authorizing Provider  amLODipine (NORVASC) 10 MG tablet TAKE 1 TABLET BY MOUTH EVERY DAY 05/03/19   Kathrene Alu, MD    atorvastatin (LIPITOR) 40 MG tablet TAKE 1 TABLET (40 MG TOTAL) BY MOUTH DAILY AT 6 PM. 05/03/19   Winfrey, Alcario Drought, MD    Allergies    Pork-derived products  Review of Systems   Review of Systems  Constitutional: Negative for fever.  Respiratory: Positive for shortness of breath.   Cardiovascular: Positive for chest pain.  Gastrointestinal: Positive for abdominal pain. Negative for diarrhea and vomiting.  Musculoskeletal: Negative for back pain.  All other systems reviewed and are negative.   Physical Exam Updated Vital Signs BP 136/85   Pulse 72   Temp 98.7 F (37.1 C) (Oral)   Resp 16   SpO2 94%   Physical Exam Vitals and nursing note reviewed.  Constitutional:      Appearance: He is well-developed. He is not ill-appearing or diaphoretic.  HENT:     Head: Normocephalic and atraumatic.     Right Ear: External ear normal.     Left Ear: External ear normal.     Nose: Nose normal.  Eyes:     General:        Right eye: No discharge.        Left eye: No discharge.  Cardiovascular:     Rate and Rhythm: Normal rate and regular rhythm.     Heart sounds: Normal heart sounds.  Pulmonary:     Effort:  Pulmonary effort is normal.     Breath sounds: Normal breath sounds.  Abdominal:     Palpations: Abdomen is soft.     Tenderness: There is abdominal tenderness in the right upper quadrant, epigastric area and left upper quadrant. There is no right CVA tenderness or left CVA tenderness.  Musculoskeletal:     Cervical back: Neck supple.  Skin:    General: Skin is warm and dry.  Neurological:     Mental Status: He is alert.  Psychiatric:        Mood and Affect: Mood is not anxious.     ED Results / Procedures / Treatments   Labs (all labs ordered are listed, but only abnormal results are displayed) Labs Reviewed  COMPREHENSIVE METABOLIC PANEL - Abnormal; Notable for the following components:      Result Value   Glucose, Bld 161 (*)    BUN 5 (*)    Calcium 8.7 (*)     Albumin 3.3 (*)    AST 83 (*)    Alkaline Phosphatase 168 (*)    Total Bilirubin 1.6 (*)    All other components within normal limits  CBC - Abnormal; Notable for the following components:   WBC 15.8 (*)    MCV 102.7 (*)    All other components within normal limits  URINALYSIS, ROUTINE W REFLEX MICROSCOPIC - Abnormal; Notable for the following components:   Hgb urine dipstick SMALL (*)    All other components within normal limits  LIPASE, BLOOD  TROPONIN I (HIGH SENSITIVITY)    EKG EKG Interpretation  Date/Time:  Sunday November 21 2019 00:26:54 EDT Ventricular Rate:  102 PR Interval:  148 QRS Duration: 82 QT Interval:  350 QTC Calculation: 456 R Axis:   31 Text Interpretation: Sinus tachycardia Nonspecific T wave abnormality Abnormal ECG no significant change since April 2020 Confirmed by Sherwood Gambler [id number]) on 11/21/2019 9:42:15 AM   Radiology CT ABDOMEN PELVIS W CONTRAST  Result Date: 11/21/2019 CLINICAL DATA:  Generalized abdominal pain radiating to left flank since last night. Nausea. EXAM: CT ABDOMEN AND PELVIS WITH CONTRAST TECHNIQUE: Multidetector CT imaging of the abdomen and pelvis was performed using the standard protocol following bolus administration of intravenous contrast. CONTRAST:  163mL OMNIPAQUE IOHEXOL 300 MG/ML  SOLN COMPARISON:  Abdominal ultrasound of earlier today. Prior CT of 04/09/2019. FINDINGS: Lower chest: Motion degradation at the lung bases. Mild nodularity at the anterior right lung base is unchanged and likely due to tiny granulomas. Mild cardiomegaly, without pericardial or pleural effusion. Hepatobiliary: Mild motion continuing into the upper abdomen. Moderate hepatic steatosis and marked hepatomegaly at 22.5 cm craniocaudal. Caudate and lateral segment left liver lobe prominence, nonspecific. Vague area of hypoattenuation within the right hepatic lobe at 1.6 cm on 34/3 may represent more focal steatosis. The gallbladder is distended. No calcified  stones. Subtle pericholecystic edema, including on 43/3. No biliary duct dilatation. Pancreas: No pancreatic duct dilatation or focal pancreatic abnormality. Spleen: Normal in size, without focal abnormality. Adrenals/Urinary Tract: Normal adrenal glands. Normal kidneys, without hydronephrosis. Normal urinary bladder. Stomach/Bowel: Normal stomach, without wall thickening. Normal colon, appendix, and terminal ileum. Normal small bowel. Vascular/Lymphatic: Aortic atherosclerosis. No abdominopelvic adenopathy. Reproductive: Normal prostate. Other: Trace upper pelvic fluid, including on 74/3. No free intraperitoneal air. Musculoskeletal: Presumed degenerative cyst in the left femoral head. Heterotopic ossification about the right iliac is unchanged. IMPRESSION: 1. Mildly motion degraded exam. 2. Gallbladder distension with edema, felt to be centered about the gallbladder. Given absence of  stones on ultrasound, acalculous cholecystitis is a consideration and nuclear medicine hepatobiliary study should be considered. Less likely explanations for right upper quadrant edema, including focal pancreatitis, warrant laboratory correlation. 3. Hepatic steatosis and marked hepatomegaly. Caudate and lateral segment left liver lobe prominence is nonspecific. Correlate with risk factors for mild cirrhosis. 4. More focal hypoattenuation in the right hepatic lobe may simply represent focal steatosis. If the patient is eventually diagnosed with cirrhosis, consider nonemergent outpatient pre and post contrast abdominal MRI. 5. Trace upper pelvic fluid, presumably secondary 6. Aortic Atherosclerosis (ICD10-I70.0). Electronically Signed   By: Abigail Miyamoto M.D.   On: 11/21/2019 12:00   DG Chest Portable 1 View  Result Date: 11/21/2019 CLINICAL DATA:  Chest and abdominal pain EXAM: PORTABLE CHEST 1 VIEW COMPARISON:  11/17/2018 chest radiograph. FINDINGS: Stable cardiomediastinal silhouette with top-normal heart size. No pneumothorax.  No right pleural effusion. Stable blunting of the left costophrenic angle. No pulmonary edema. No acute consolidative airspace disease. IMPRESSION: Chronic blunting of the left costophrenic angle, favor scarring or atelectasis. Otherwise no active disease in the chest. Electronically Signed   By: Ilona Sorrel M.D.   On: 11/21/2019 09:58   US Abdomen Limited RUQ  Result Date: 11/21/2019 CLINICAL DATA:  Abdominal pain since yesterday. EXAM: ULTRASOUND ABDOMEN LIMITED RIGHT UPPER QUADRANT COMPARISON:  04/09/2019 and the CT of 04/09/2019 FINDINGS: Gallbladder: No gallstones or wall thickening visualized. No sonographic Murphy sign noted by sonographer. Common bile duct: Diameter: Normal, 3 mm. Liver: Moderately increased in echogenicity with mild heterogeneity. Portal vein is patent on color Doppler imaging with normal direction of blood flow towards the liver. Other: None. IMPRESSION: 1.  No acute process or explanation for abdominal pain. 2. Heterogeneous hepatic steatosis. Electronically Signed   By: Abigail Miyamoto M.D.   On: 11/21/2019 09:57    Procedures Procedures (including critical care time)  Medications Ordered in ED Medications  sodium chloride flush (NS) 0.9 % injection 3 mL (3 mLs Intravenous Given 11/21/19 0853)  sodium chloride 0.9 % bolus 1,000 mL (0 mLs Intravenous Stopped 11/21/19 1000)  HYDROmorphone (DILAUDID) injection 1 mg (1 mg Intravenous Given 11/21/19 0858)  iohexol (OMNIPAQUE) 300 MG/ML solution 100 mL (100 mLs Intravenous Contrast Given 11/21/19 1128)  technetium TC 68M mebrofenin (CHOLETEC) injection 5.4 millicurie (5.4 millicuries Intravenous Contrast Given 11/21/19 1500)    ED Course  I have reviewed the triage vital signs and the nursing notes.  Pertinent labs & imaging results that were available during my care of the patient were reviewed by me and considered in my medical decision making (see chart for details).    MDM Rules/Calculators/A&P                       Patient is still having pain though is much better.  I discussed his CT results with Dr. Gershon Crane, who advises HIDA scan and eval from there.  Discussed with radiology and we can get HIDA scan today.  Otherwise, pain seems to be decently controlled and will hold off on narcotics as this can interfere with the results.  Care to Dr. Rogene Houston. Final Clinical Impression(s) / ED Diagnoses Final diagnoses:  Upper abdominal pain    Rx / DC Orders ED Discharge Orders    None       Sherwood Gambler, MD 11/21/19 1549

## 2019-11-21 NOTE — ED Notes (Signed)
Patient transported to Nuc Med 

## 2019-11-22 ENCOUNTER — Observation Stay (HOSPITAL_COMMUNITY): Payer: Self-pay | Admitting: Registered Nurse

## 2019-11-22 ENCOUNTER — Encounter (HOSPITAL_COMMUNITY)
Admission: EM | Disposition: A | Discharge status: Home / Self Care | Payer: Self-pay | Source: Home / Self Care | Attending: Emergency Medicine

## 2019-11-22 ENCOUNTER — Encounter (HOSPITAL_COMMUNITY): Payer: Self-pay

## 2019-11-22 HISTORY — PX: CHOLECYSTECTOMY: SHX55

## 2019-11-22 LAB — COMPREHENSIVE METABOLIC PANEL
ALT: 41 U/L (ref 0–44)
AST: 82 U/L — ABNORMAL HIGH (ref 15–41)
Albumin: 2.7 g/dL — ABNORMAL LOW (ref 3.5–5.0)
Alkaline Phosphatase: 170 U/L — ABNORMAL HIGH (ref 38–126)
Anion gap: 10 (ref 5–15)
BUN: 6 mg/dL (ref 6–20)
CO2: 28 mmol/L (ref 22–32)
Calcium: 8.1 mg/dL — ABNORMAL LOW (ref 8.9–10.3)
Chloride: 101 mmol/L (ref 98–111)
Creatinine, Ser: 0.78 mg/dL (ref 0.61–1.24)
GFR calc Af Amer: >60 mL/min (ref >60)
GFR calc non Af Amer: >60 mL/min (ref >60)
Glucose, Bld: 100 mg/dL — ABNORMAL HIGH (ref 70–99)
Potassium: 3.9 mmol/L (ref 3.5–5.1)
Sodium: 139 mmol/L (ref 135–145)
Total Bilirubin: 1.8 mg/dL — ABNORMAL HIGH (ref 0.3–1.2)
Total Protein: 6.3 g/dL — ABNORMAL LOW (ref 6.5–8.1)

## 2019-11-22 LAB — CBC
HCT: 44.5 % (ref 39.0–52.0)
Hemoglobin: 14.4 g/dL (ref 13.0–17.0)
MCH: 33.9 pg (ref 26.0–34.0)
MCHC: 32.4 g/dL (ref 30.0–36.0)
MCV: 104.7 fL — ABNORMAL HIGH (ref 80.0–100.0)
Platelets: 175 10*3/uL (ref 150–400)
RBC: 4.25 MIL/uL (ref 4.22–5.81)
RDW: 13.2 % (ref 11.5–15.5)
WBC: 11.5 10*3/uL — ABNORMAL HIGH (ref 4.0–10.5)
nRBC: 0 % (ref 0.0–0.2)

## 2019-11-22 LAB — SURGICAL PCR SCREEN
MRSA, PCR: NEGATIVE
Staphylococcus aureus: NEGATIVE

## 2019-11-22 LAB — GLUCOSE, CAPILLARY: Glucose-Capillary: 93 mg/dL (ref 70–99)

## 2019-11-22 SURGERY — LAPAROSCOPIC CHOLECYSTECTOMY WITH INTRAOPERATIVE CHOLANGIOGRAM
Anesthesia: General | Site: Abdomen

## 2019-11-22 MED ORDER — FENTANYL CITRATE (PF) 250 MCG/5ML IJ SOLN
INTRAMUSCULAR | Status: DC | PRN
  Administered 2019-11-22: 100 ug via INTRAVENOUS

## 2019-11-22 MED ORDER — ONDANSETRON HCL 4 MG/2ML IJ SOLN
INTRAMUSCULAR | Status: AC
  Filled 2019-11-22: qty 2

## 2019-11-22 MED ORDER — INDOCYANINE GREEN 25 MG IV SOLR
INTRAVENOUS | Status: DC | PRN
  Administered 2019-11-22: 2.5 mg via INTRAVENOUS

## 2019-11-22 MED ORDER — ROCURONIUM BROMIDE 10 MG/ML (PF) SYRINGE
PREFILLED_SYRINGE | INTRAVENOUS | Status: AC
  Filled 2019-11-22: qty 10

## 2019-11-22 MED ORDER — MIDAZOLAM HCL 2 MG/2ML IJ SOLN
INTRAMUSCULAR | Status: AC
  Filled 2019-11-22: qty 2

## 2019-11-22 MED ORDER — DEXAMETHASONE SODIUM PHOSPHATE 10 MG/ML IJ SOLN
INTRAMUSCULAR | Status: AC
  Filled 2019-11-22: qty 1

## 2019-11-22 MED ORDER — DEXAMETHASONE SODIUM PHOSPHATE 10 MG/ML IJ SOLN
INTRAMUSCULAR | Status: DC | PRN
  Administered 2019-11-22: 5 mg via INTRAVENOUS

## 2019-11-22 MED ORDER — HYDROMORPHONE HCL 1 MG/ML IJ SOLN
0.2500 mg | INTRAMUSCULAR | Status: DC | PRN
  Administered 2019-11-22: 0.25 mg via INTRAVENOUS

## 2019-11-22 MED ORDER — MIDAZOLAM HCL 5 MG/5ML IJ SOLN
INTRAMUSCULAR | Status: DC | PRN
  Administered 2019-11-22: 2 mg via INTRAVENOUS

## 2019-11-22 MED ORDER — BUPIVACAINE HCL 0.25 % IJ SOLN
INTRAMUSCULAR | Status: DC | PRN
  Administered 2019-11-22: 20 mL

## 2019-11-22 MED ORDER — HYDROMORPHONE HCL 1 MG/ML IJ SOLN
1.0000 mg | INTRAMUSCULAR | Status: DC | PRN
  Administered 2019-11-22: 1 mg via INTRAVENOUS
  Filled 2019-11-22: qty 1

## 2019-11-22 MED ORDER — 0.9 % SODIUM CHLORIDE (POUR BTL) OPTIME
TOPICAL | Status: DC | PRN
  Administered 2019-11-22: 1000 mL

## 2019-11-22 MED ORDER — SODIUM CHLORIDE 0.9 % IR SOLN
Status: DC | PRN
  Administered 2019-11-22 (×2): 1

## 2019-11-22 MED ORDER — ROCURONIUM BROMIDE 10 MG/ML (PF) SYRINGE
PREFILLED_SYRINGE | INTRAVENOUS | Status: DC | PRN
  Administered 2019-11-22: 50 mg via INTRAVENOUS
  Administered 2019-11-22: 20 mg via INTRAVENOUS

## 2019-11-22 MED ORDER — HYDROMORPHONE HCL 1 MG/ML IJ SOLN
INTRAMUSCULAR | Status: AC
  Filled 2019-11-22: qty 1

## 2019-11-22 MED ORDER — SODIUM CHLORIDE 0.9 % IV SOLN
2.0000 g | INTRAVENOUS | Status: AC
  Administered 2019-11-22: 2 g via INTRAVENOUS
  Filled 2019-11-22: qty 20

## 2019-11-22 MED ORDER — ONDANSETRON HCL 4 MG/2ML IJ SOLN
INTRAMUSCULAR | Status: DC | PRN
  Administered 2019-11-22: 4 mg via INTRAVENOUS

## 2019-11-22 MED ORDER — PHENYLEPHRINE 40 MCG/ML (10ML) SYRINGE FOR IV PUSH (FOR BLOOD PRESSURE SUPPORT)
PREFILLED_SYRINGE | INTRAVENOUS | Status: AC
  Filled 2019-11-22: qty 10

## 2019-11-22 MED ORDER — ORAL CARE MOUTH RINSE: 15.0000 mL | Freq: Two times a day (BID) | OROMUCOSAL | Status: DC

## 2019-11-22 MED ORDER — LACTATED RINGERS IV SOLN: INTRAVENOUS | Status: DC

## 2019-11-22 MED ORDER — SUGAMMADEX SODIUM 200 MG/2ML IV SOLN
INTRAVENOUS | Status: DC | PRN
  Administered 2019-11-22 (×2): 100 mg via INTRAVENOUS

## 2019-11-22 MED ORDER — PROPOFOL 10 MG/ML IV BOLUS
INTRAVENOUS | Status: AC
  Filled 2019-11-22: qty 20

## 2019-11-22 MED ORDER — BUPIVACAINE HCL (PF) 0.25 % IJ SOLN
INTRAMUSCULAR | Status: AC
  Filled 2019-11-22: qty 30

## 2019-11-22 MED ORDER — FENTANYL CITRATE (PF) 250 MCG/5ML IJ SOLN
INTRAMUSCULAR | Status: AC
  Filled 2019-11-22: qty 5

## 2019-11-22 MED ORDER — LIDOCAINE 2% (20 MG/ML) 5 ML SYRINGE
INTRAMUSCULAR | Status: DC | PRN
  Administered 2019-11-22: 100 mg via INTRAVENOUS

## 2019-11-22 MED ORDER — LIDOCAINE 2% (20 MG/ML) 5 ML SYRINGE
INTRAMUSCULAR | Status: AC
  Filled 2019-11-22: qty 5

## 2019-11-22 MED ORDER — PROPOFOL 10 MG/ML IV BOLUS
INTRAVENOUS | Status: DC | PRN
  Administered 2019-11-22: 150 mg via INTRAVENOUS

## 2019-11-22 MED ORDER — OXYCODONE HCL 5 MG PO TABS
5.0000 mg | ORAL_TABLET | ORAL | Status: DC | PRN
  Administered 2019-11-22 – 2019-11-23 (×3): 5 mg via ORAL
  Filled 2019-11-22 (×3): qty 1

## 2019-11-22 MED ORDER — ALBUTEROL SULFATE HFA 108 (90 BASE) MCG/ACT IN AERS
INHALATION_SPRAY | RESPIRATORY_TRACT | Status: DC | PRN
  Administered 2019-11-22: 4 via RESPIRATORY_TRACT

## 2019-11-22 MED ORDER — PHENYLEPHRINE 40 MCG/ML (10ML) SYRINGE FOR IV PUSH (FOR BLOOD PRESSURE SUPPORT)
PREFILLED_SYRINGE | INTRAVENOUS | Status: DC | PRN
  Administered 2019-11-22 (×2): 80 ug via INTRAVENOUS

## 2019-11-22 SURGICAL SUPPLY — 46 items
APPLIER CLIP 5 13 M/L LIGAMAX5 (MISCELLANEOUS) ×3
BLADE CLIPPER SURG (BLADE) IMPLANT
CANISTER SUCT 3000ML PPV (MISCELLANEOUS) ×3 IMPLANT
CHLORAPREP W/TINT 26 (MISCELLANEOUS) ×3 IMPLANT
CLIP APPLIE 5 13 M/L LIGAMAX5 (MISCELLANEOUS) ×1 IMPLANT
COVER MAYO STAND STRL (DRAPES) ×3 IMPLANT
COVER SURGICAL LIGHT HANDLE (MISCELLANEOUS) ×3 IMPLANT
COVER WAND RF STERILE (DRAPES) ×3 IMPLANT
DERMABOND ADVANCED (GAUZE/BANDAGES/DRESSINGS) ×2
DERMABOND ADVANCED .7 DNX12 (GAUZE/BANDAGES/DRESSINGS) ×1 IMPLANT
DRAPE C-ARM 42X120 X-RAY (DRAPES) ×3 IMPLANT
ELECT REM PT RETURN 9FT ADLT (ELECTROSURGICAL) ×3
ELECTRODE REM PT RTRN 9FT ADLT (ELECTROSURGICAL) ×1 IMPLANT
ENDOLOOP SUT PDS II  0 18 (SUTURE) ×4
ENDOLOOP SUT PDS II 0 18 (SUTURE) ×2 IMPLANT
FILTER SMOKE EVAC LAPAROSHD (FILTER) IMPLANT
GLOVE BIO SURGEON STRL SZ8 (GLOVE) ×3 IMPLANT
GLOVE BIOGEL PI IND STRL 8 (GLOVE) ×1 IMPLANT
GLOVE BIOGEL PI INDICATOR 8 (GLOVE) ×2
GOWN STRL REUS W/ TWL LRG LVL3 (GOWN DISPOSABLE) ×2 IMPLANT
GOWN STRL REUS W/ TWL XL LVL3 (GOWN DISPOSABLE) ×1 IMPLANT
GOWN STRL REUS W/TWL LRG LVL3 (GOWN DISPOSABLE) ×4
GOWN STRL REUS W/TWL XL LVL3 (GOWN DISPOSABLE) ×2
KIT BASIN OR (CUSTOM PROCEDURE TRAY) ×3 IMPLANT
KIT TURNOVER KIT B (KITS) ×3 IMPLANT
L-HOOK LAP DISP 36CM (ELECTROSURGICAL) ×3
LHOOK LAP DISP 36CM (ELECTROSURGICAL) ×1 IMPLANT
NEEDLE 22X1 1/2 (OR ONLY) (NEEDLE) ×3 IMPLANT
NS IRRIG 1000ML POUR BTL (IV SOLUTION) ×3 IMPLANT
PAD ARMBOARD 7.5X6 YLW CONV (MISCELLANEOUS) ×3 IMPLANT
PENCIL BUTTON HOLSTER BLD 10FT (ELECTRODE) ×3 IMPLANT
POUCH RETRIEVAL ECOSAC 10 (ENDOMECHANICALS) ×1 IMPLANT
POUCH RETRIEVAL ECOSAC 10MM (ENDOMECHANICALS) ×2
SCISSORS LAP 5X35 DISP (ENDOMECHANICALS) ×3 IMPLANT
SET CHOLANGIOGRAPH 5 50 .035 (SET/KITS/TRAYS/PACK) ×3 IMPLANT
SET IRRIG TUBING LAPAROSCOPIC (IRRIGATION / IRRIGATOR) ×3 IMPLANT
SET TUBE SMOKE EVAC HIGH FLOW (TUBING) ×3 IMPLANT
SLEEVE ENDOPATH XCEL 5M (ENDOMECHANICALS) ×9 IMPLANT
SPECIMEN JAR SMALL (MISCELLANEOUS) ×3 IMPLANT
SUT VIC AB 4-0 PS2 27 (SUTURE) ×3 IMPLANT
TOWEL GREEN STERILE (TOWEL DISPOSABLE) ×3 IMPLANT
TOWEL GREEN STERILE FF (TOWEL DISPOSABLE) ×3 IMPLANT
TRAY LAPAROSCOPIC MC (CUSTOM PROCEDURE TRAY) ×3 IMPLANT
TROCAR XCEL BLUNT TIP 100MML (ENDOMECHANICALS) ×3 IMPLANT
TROCAR XCEL NON-BLD 5MMX100MML (ENDOMECHANICALS) ×3 IMPLANT
WATER STERILE IRR 1000ML POUR (IV SOLUTION) ×3 IMPLANT

## 2019-11-22 NOTE — Progress Notes (Signed)
Subjective/Chief Complaint: RUQ pain Reports bruise at IV stick site R arm  Objective: Vital signs in last 24 hours: Temp:  [98.6 F (37 C)-99 F (37.2 C)] 98.6 F (37 C) (04/19 0617) Pulse Rate:  [69-82] 70 (04/19 0617) Resp:  [14-24] 18 (04/19 0617) BP: (117-160)/(69-95) 140/84 (04/19 0617) SpO2:  [93 %-97 %] 95 % (04/19 0617) Weight:  [95.1 kg] 95.1 kg (04/18 2256)    Intake/Output from previous day: 04/18 0701 - 04/19 0700 In: 1824.9 [I.V.:724.8; IV Piggyback:1100.1] Out: 800 [Urine:800] Intake/Output this shift: No intake/output data recorded.  General appearance: alert and cooperative Resp: clear to auscultation bilaterally Cardio: regular rate and rhythm GI: soft, tender RUQ Extremities: sub cut hemorrhage at site previous vein stick R antecubital  Lab Results:  Recent Labs    11/21/19 0046  WBC 15.8*  HGB 15.3  HCT 46.4  PLT 195   BMET Recent Labs    11/21/19 0046  NA 136  K 3.5  CL 99  CO2 23  GLUCOSE 161*  BUN 5*  CREATININE 0.74  CALCIUM 8.7*   PT/INR Recent Labs    11/21/19 1835  LABPROT 15.9*  INR 1.3*   ABG No results for input(s): PHART, HCO3 in the last 72 hours.  Invalid input(s): PCO2, PO2  Studies/Results: NM Hepatobiliary Liver Func  Result Date: 11/21/2019 CLINICAL DATA:  Cholecystitis.  Generalized abdominal pain. EXAM: NUCLEAR MEDICINE HEPATOBILIARY IMAGING TECHNIQUE: Sequential images of the abdomen were obtained out to 60 minutes following intravenous administration of radiopharmaceutical. RADIOPHARMACEUTICALS:  5.4 mCi Tc-51m  Choletec IV COMPARISON:  CT and ultrasound earlier this day. FINDINGS: Prompt uptake and biliary excretion of activity by the liver is seen. Gallbladder activity not visualized. There is activity within the common bile duct and small bowel. Patient was administered 3 mg morphine IV. Imaging obtained for 30 minutes after morphine injection. Gallbladder is not visualized after morphine injection.  IMPRESSION: 1. Gallbladder is not visualized either before or after morphine injection compatible with obstruction of the cystic duct and acute cholecystitis. 2. Normal hepatic uptake. There is activity within the common bile duct in small bowel which confirms common bile duct patency. Electronically Signed   By: Keith Rake M.D.   On: 11/21/2019 17:34   CT ABDOMEN PELVIS W CONTRAST  Result Date: 11/21/2019 CLINICAL DATA:  Generalized abdominal pain radiating to left flank since last night. Nausea. EXAM: CT ABDOMEN AND PELVIS WITH CONTRAST TECHNIQUE: Multidetector CT imaging of the abdomen and pelvis was performed using the standard protocol following bolus administration of intravenous contrast. CONTRAST:  144mL OMNIPAQUE IOHEXOL 300 MG/ML  SOLN COMPARISON:  Abdominal ultrasound of earlier today. Prior CT of 04/09/2019. FINDINGS: Lower chest: Motion degradation at the lung bases. Mild nodularity at the anterior right lung base is unchanged and likely due to tiny granulomas. Mild cardiomegaly, without pericardial or pleural effusion. Hepatobiliary: Mild motion continuing into the upper abdomen. Moderate hepatic steatosis and marked hepatomegaly at 22.5 cm craniocaudal. Caudate and lateral segment left liver lobe prominence, nonspecific. Vague area of hypoattenuation within the right hepatic lobe at 1.6 cm on 34/3 may represent more focal steatosis. The gallbladder is distended. No calcified stones. Subtle pericholecystic edema, including on 43/3. No biliary duct dilatation. Pancreas: No pancreatic duct dilatation or focal pancreatic abnormality. Spleen: Normal in size, without focal abnormality. Adrenals/Urinary Tract: Normal adrenal glands. Normal kidneys, without hydronephrosis. Normal urinary bladder. Stomach/Bowel: Normal stomach, without wall thickening. Normal colon, appendix, and terminal ileum. Normal small bowel. Vascular/Lymphatic: Aortic atherosclerosis. No  abdominopelvic adenopathy.  Reproductive: Normal prostate. Other: Trace upper pelvic fluid, including on 74/3. No free intraperitoneal air. Musculoskeletal: Presumed degenerative cyst in the left femoral head. Heterotopic ossification about the right iliac is unchanged. IMPRESSION: 1. Mildly motion degraded exam. 2. Gallbladder distension with edema, felt to be centered about the gallbladder. Given absence of stones on ultrasound, acalculous cholecystitis is a consideration and nuclear medicine hepatobiliary study should be considered. Less likely explanations for right upper quadrant edema, including focal pancreatitis, warrant laboratory correlation. 3. Hepatic steatosis and marked hepatomegaly. Caudate and lateral segment left liver lobe prominence is nonspecific. Correlate with risk factors for mild cirrhosis. 4. More focal hypoattenuation in the right hepatic lobe may simply represent focal steatosis. If the patient is eventually diagnosed with cirrhosis, consider nonemergent outpatient pre and post contrast abdominal MRI. 5. Trace upper pelvic fluid, presumably secondary 6. Aortic Atherosclerosis (ICD10-I70.0). Electronically Signed   By: Abigail Miyamoto M.D.   On: 11/21/2019 12:00   DG Chest Portable 1 View  Result Date: 11/21/2019 CLINICAL DATA:  Chest and abdominal pain EXAM: PORTABLE CHEST 1 VIEW COMPARISON:  11/17/2018 chest radiograph. FINDINGS: Stable cardiomediastinal silhouette with top-normal heart size. No pneumothorax. No right pleural effusion. Stable blunting of the left costophrenic angle. No pulmonary edema. No acute consolidative airspace disease. IMPRESSION: Chronic blunting of the left costophrenic angle, favor scarring or atelectasis. Otherwise no active disease in the chest. Electronically Signed   By: Ilona Sorrel M.D.   On: 11/21/2019 09:58   US Abdomen Limited RUQ  Result Date: 11/21/2019 CLINICAL DATA:  Abdominal pain since yesterday. EXAM: ULTRASOUND ABDOMEN LIMITED RIGHT UPPER QUADRANT COMPARISON:   04/09/2019 and the CT of 04/09/2019 FINDINGS: Gallbladder: No gallstones or wall thickening visualized. No sonographic Murphy sign noted by sonographer. Common bile duct: Diameter: Normal, 3 mm. Liver: Moderately increased in echogenicity with mild heterogeneity. Portal vein is patent on color Doppler imaging with normal direction of blood flow towards the liver. Other: None. IMPRESSION: 1.  No acute process or explanation for abdominal pain. 2. Heterogeneous hepatic steatosis. Electronically Signed   By: Abigail Miyamoto M.D.   On: 11/21/2019 09:57    Anti-infectives: Anti-infectives (From admission, onward)   Start     Dose/Rate Route Frequency Ordered Stop   11/21/19 1945  cefTRIAXone (ROCEPHIN) 2 g in sodium chloride 0.9 % 100 mL IVPB     2 g 200 mL/hr over 30 Minutes Intravenous Every 24 hours 11/21/19 1931        Assessment/Plan: Cholecystitis - for laparoscopic cholecystectomy, possible cholangiogram. I discussed the procedure, risks,and benefits with him. I discussed the expected post-op course. He agrees.  LOS: 0 days    Zenovia Jarred 11/22/2019

## 2019-11-22 NOTE — Progress Notes (Signed)
Received pt back from the OR/PACU,  Pt noted to have 5 incision on abd with liquid glue.  No drainage or blood noted.  Pt is having 3/10 pain per interpreter.  Pt will be medicated and pt made comfortable as possible.  Will continue to monitor pt throughout the shift.  Pt is made aware that he will have to get out of the bed to the chair and walk also.  Pt verbalizes understanding

## 2019-11-22 NOTE — Op Note (Signed)
  11/21/2019 - 11/22/2019  2:37 PM  PATIENT:  Micheal Watson  58 y.o. male  PRE-OPERATIVE DIAGNOSIS:  CHOLECYSTITIS  POST-OPERATIVE DIAGNOSIS:  CHOLECYSTITIS WITH HYDROPS  PROCEDURE:  Procedure(s): LAPAROSCOPIC CHOLECYSTECTOMY  SURGEON:  Surgeon(s): Georganna Skeans, MD  ASSISTANTS: Alferd Apa, PA-C   ANESTHESIA:   local and general  EBL:  Total I/O In: 1060 [P.O.:60; I.V.:1000] Out: 55 [Urine:400; Blood:20]  BLOOD ADMINISTERED:none  DRAINS: none   SPECIMEN:  Excision  DISPOSITION OF SPECIMEN:  PATHOLOGY  COUNTS:  YES  DICTATION: .Dragon Dictation Findings: Acute cholecystitis with hydrops Procedure in detail: Informed consent was obtained.  He received intravenous antibiotics.  He was brought to the operating room and general endotracheal anesthesia was administered by the anesthesia staff.  His abdomen was prepped and draped in a sterile fashion.  Timeout procedure was performed.The supraumbilical region was infiltrated with local. Supraumbilical incision was made. Subcutaneous tissues were dissected down revealing the anterior fascia. This was divided sharply along the midline. Peritoneal cavity was entered under direct vision without complication. A 0 Vicryl pursestring was placed around the fascial opening. Hassan trocar was inserted into the abdomen. The abdomen was insufflated with carbon dioxide in standard fashion. Under direct vision a 5 mm epigastric port and two 5 mm right-sided ports were placed.  Local was used at each port site.  Laparoscopic exploration revealed his liver was extremely large and extended low so I moved to the epigastric port caudal.  The dome of the gallbladder was retracted superior medially.  Omental adhesions were gradually taken down off the ball gallbladder until we reveal the infundibulum.  Dissection began laterally and progressed medially identifying the cystic duct which was somewhat large and the cystic artery.  The gallbladder  spontaneously ruptured revealing clear bile which is consistent with hydrops.  This was suctioned out completely.  Dissection continued until he had a critical view of safety around the cystic duct.  Cystic artery was clipped twice proximally and once distally and divided.  The cystic duct was then clipped 3 times proximally and once distally and divided.  It was kind of wide so I then placed an Endoloop of PDS to further secure the closure of the cystic duct.  The gallbladder was taken off the liver bed using Bovie cautery.  Hemostasis was ensured in the liver bed.  The gallbladder was placed in a bag and removed from the abdomen.  It was sent to pathology.  The liver bed was irrigated and hemostasis was ensured.  Clips remain in good position.  Ports were removed under direct vision.  Pneumoperitoneum was released.  Supraumbilical fascia was closed by tying the pursestring.  All wounds were irrigated and the skin of each was closed with 4-0 Vicryl followed by Dermabond.  All counts were correct.  He tolerated the procedure well without apparent complication was taken to recovery in stable condition.  PATIENT DISPOSITION:  PACU - hemodynamically stable.   Delay start of Pharmacological VTE agent (>24hrs) due to surgical blood loss or risk of bleeding:  no  Georganna Skeans, MD, MPH, FACS Pager: [phone]  4/19/20212:37 PM

## 2019-11-22 NOTE — Anesthesia Preprocedure Evaluation (Addendum)
Anesthesia Evaluation  Patient identified by MRN, date of birth, ID band Patient awake    Reviewed: Allergy & Precautions, H&P , NPO status , Patient's Chart, lab work & pertinent test results  Airway Mallampati: II  TM Distance: >3 FB Neck ROM: Full    Dental no notable dental hx. (+) Teeth Intact, Dental Advisory Given   Pulmonary neg pulmonary ROS, former smoker,    Pulmonary exam normal breath sounds clear to auscultation       Cardiovascular hypertension, Pt. on medications  Rhythm:Regular Rate:Normal     Neuro/Psych negative neurological ROS  negative psych ROS   GI/Hepatic negative GI ROS, Neg liver ROS,   Endo/Other  negative endocrine ROS  Renal/GU negative Renal ROS  negative genitourinary   Musculoskeletal   Abdominal   Peds  Hematology negative hematology ROS (+)   Anesthesia Other Findings   Reproductive/Obstetrics negative OB ROS                            Anesthesia Physical Anesthesia Plan  ASA: II  Anesthesia Plan: General   Post-op Pain Management:    Induction: Intravenous  PONV Risk Score and Plan: 3 and Ondansetron, Dexamethasone and Midazolam  Airway Management Planned: Oral ETT  Additional Equipment:   Intra-op Plan:   Post-operative Plan: Extubation in OR  Informed Consent: I have reviewed the patients History and Physical, chart, labs and discussed the procedure including the risks, benefits and alternatives for the proposed anesthesia with the patient or authorized representative who has indicated his/her understanding and acceptance.     Dental advisory given  Plan Discussed with: CRNA  Anesthesia Plan Comments:         Anesthesia Quick Evaluation  

## 2019-11-22 NOTE — Anesthesia Procedure Notes (Signed)
Procedure Name: Intubation Date/Time: 11/22/2019 1:36 PM Performed by: Trinna Post., CRNA Pre-anesthesia Checklist: Patient identified, Emergency Drugs available, Suction available, Patient being monitored and Timeout performed Patient Re-evaluated:Patient Re-evaluated prior to induction Oxygen Delivery Method: Circle system utilized Preoxygenation: Pre-oxygenation with 100% oxygen Induction Type: IV induction Ventilation: Mask ventilation without difficulty Laryngoscope Size: Mac and 4 Grade View: Grade I Tube type: Oral Tube size: 7.5 mm Number of attempts: 1 Airway Equipment and Method: Stylet Placement Confirmation: ETT inserted through vocal cords under direct vision,  positive ETCO2 and breath sounds checked- equal and bilateral Secured at: 22 cm Tube secured with: Tape Dental Injury: Teeth and Oropharynx as per pre-operative assessment

## 2019-11-22 NOTE — TOC Initial Note (Signed)
Transition of Care [facility]) - Initial/Assessment Note    Patient Details  Name: Micheal Watson MRN: [id number] Date of Birth: 06/26/1961  [facility] CM/SW Contact:    Marilu Favre, RN Phone Number: 11/22/2019, 4:12 PM  Clinical Narrative:                 Spoke to patient at bedside via status interpreter Lya [phone].   Confirmed face sheet information. Patient lives with his boss and can return there at discharge.   Patient does not have insurance or PCP.   Discussed [facility], scheduled follow up appointment . Patient in agreement. Information placed on AVS.   Discussed MATCH program. If patient discharged on week day will send prescriptions to [facility] and have filled through [facility] at $3 per prescription. Patient states he can afford $3 per prescription. If discharged after hours or on weekend patient will need paper prescriptions and will be provided with [facility] letter with pharmacies who will honor [facility] letter.   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   Patient states his boss is able to provide transportation home at discharge.   Will continue to follow.  Expected Discharge Plan: Home/Self Care Barriers to Discharge: Continued Medical Work up   Patient Goals and CMS Choice Patient states their goals for this hospitalization and ongoing recovery are:: to return to home CMS Medicare.gov Compare Post Acute Care list provided to:: Patient Choice offered to / list presented to : NA  Expected Discharge Plan and Services Expected Discharge Plan: Home/Self Care   Discharge Planning Services: CM Consult, [facility], [place], Medication Assistance   Living arrangements for the past 2 months: Single Family Home                 DME Arranged: N/A DME Agency: NA       HH Arranged: NA          Prior Living Arrangements/Services Living arrangements for the past 2 months: Single  Family Home Lives with:: Other (Comment)(Boss) Patient language and need for interpreter reviewed:: Yes Do you feel safe going back to the place where you live?: Yes      Need for Family Participation in Patient Care: Yes (Comment) Care giver support system in place?: Yes (comment)   Criminal Activity/Legal Involvement Pertinent to Current Situation/Hospitalization: No - Comment as needed  Activities of Daily Living Home Assistive Devices/Equipment: None ADL Screening (condition at time of admission) Patient's cognitive ability adequate to safely complete daily activities?: Yes Is the patient deaf or have difficulty hearing?: No Does the patient have difficulty seeing, even when wearing glasses/contacts?: No Does the patient have difficulty concentrating, remembering, or making decisions?: Yes Patient able to express need for assistance with ADLs?: Yes Does the patient have difficulty dressing or bathing?: No Independently performs ADLs?: Yes (appropriate for developmental age) Does the patient have difficulty walking or climbing stairs?: No Weakness of Legs: Both Weakness of Arms/Hands: None  Permission Sought/Granted   Permission granted to share information with : No              Emotional Assessment Appearance:: Appears stated age Attitude/Demeanor/Rapport: Engaged Affect (typically observed): Accepting Orientation: : Oriented to Self, Oriented to Place, Oriented to  Time, Oriented to Situation Alcohol / Substance Use: Not Applicable Psych Involvement: No (comment)  Admission diagnosis:  Acute cholecystitis [K81.0] Acute cholecystitis without calculus [K81.0] Upper abdominal pain [R10.10] Patient Active Problem List   Diagnosis  Date Noted  . Acute cholecystitis without calculus 11/21/2019  . Protein-calorie malnutrition ([place]) 04/11/2019  . Hyperammonemia ([place]) 04/09/2019  . Elevated LFTs   . Gynecomastia, male   . Hyperbilirubinemia   . Tremor of unknown origin    . COVID-19 virus infection 11/27/2018  . Acute respiratory disease due to COVID-19 virus 11/27/2018  . SIRS (systemic inflammatory response syndrome) ([place]) 11/25/2018  . Acute on chronic respiratory failure with hypoxia ([place]) 11/21/2018  . Suspected COVID-19 virus infection 11/17/2018   PCP:  Patient, No Pcp Per Pharmacy:   [facility] [place], [place] [address] Phone: [phone] Fax: [phone]  [facility] [place], [place] [address] [place] [address] Phone: [phone] Fax: [phone]     Social Determinants of Health (SDOH) Interventions    Readmission Risk Interventions No flowsheet data found.

## 2019-11-22 NOTE — Progress Notes (Signed)
Pt refused to get OOB, says he is hurting and sore.  Explained to pt the importance of moving asap after surgery,  Still refused,  May get oob with next shift

## 2019-11-22 NOTE — Anesthesia Postprocedure Evaluation (Signed)
Anesthesia Post Note  Patient: Micheal Watson  Procedure(s) Performed: LAPAROSCOPIC CHOLECYSTECTOMY (N/A Abdomen)     Patient location during evaluation: PACU Anesthesia Type: General Level of consciousness: awake and alert Pain management: pain level controlled Vital Signs Assessment: post-procedure vital signs reviewed and stable Respiratory status: spontaneous breathing, nonlabored ventilation and respiratory function stable Cardiovascular status: blood pressure returned to baseline and stable Postop Assessment: no apparent nausea or vomiting Anesthetic complications: no    Last Vitals:  Vitals:   11/22/19 1600 11/22/19 1704  BP: 138/88 (!) 145/92  Pulse: 80 84  Resp: 18 18  Temp: 36.6 C 36.4 C  SpO2: 96% 94%    Last Pain:  Vitals:   11/22/19 1704  TempSrc: Oral  PainSc: 5                  Khup Sapia,W. EDMOND

## 2019-11-22 NOTE — Transfer of Care (Signed)
Immediate Anesthesia Transfer of Care Note  Patient: Micheal Watson  Procedure(s) Performed: LAPAROSCOPIC CHOLECYSTECTOMY (N/A Abdomen)  Patient Location: PACU  Anesthesia Type:General  Level of Consciousness: awake, alert  and oriented  Airway & Oxygen Therapy: Patient Spontanous Breathing and Patient connected to face mask oxygen  Post-op Assessment: Report given to RN and Post -op Vital signs reviewed and stable  Post vital signs: Reviewed and stable  Last Vitals:  Vitals Value Taken Time  BP 140/81 11/22/19 1455  Temp    Pulse 79 11/22/19 1457  Resp 18 11/22/19 1457  SpO2 93 % 11/22/19 1457  Vitals shown include unvalidated device data.  Last Pain:  Vitals:   11/22/19 1202  TempSrc: Oral  PainSc:          Complications: No apparent anesthesia complications

## 2019-11-22 NOTE — Plan of Care (Signed)

## 2019-11-22 NOTE — Progress Notes (Signed)
Pt ambulated 228ft. Described some dizziness and blurry vision upon standing. Pt abdomen firm especially around the umbilicus . Abdomen also is more distended. Pt has not had bowel movement yet. Provider paged and made aware.

## 2019-11-23 LAB — SURGICAL PATHOLOGY

## 2019-11-23 MED ORDER — POLYETHYLENE GLYCOL 3350 17 G PO PACK: 17.0000 g | PACK | Freq: Every day | ORAL | 0 refills | Status: DC | PRN

## 2019-11-23 MED ORDER — DOCUSATE SODIUM 100 MG PO CAPS: 100.0000 mg | ORAL_CAPSULE | Freq: Two times a day (BID) | ORAL | 2 refills | Status: DC | PRN

## 2019-11-23 MED ORDER — ACETAMINOPHEN 325 MG PO TABS: 650.0000 mg | ORAL_TABLET | Freq: Four times a day (QID) | ORAL | Status: DC | PRN

## 2019-11-23 MED ORDER — OXYCODONE HCL 5 MG PO TABS: 5.0000 mg | ORAL_TABLET | Freq: Four times a day (QID) | ORAL | 0 refills | Status: DC | PRN

## 2019-11-23 MED ORDER — ONDANSETRON 4 MG PO TBDP: 4.0000 mg | ORAL_TABLET | Freq: Four times a day (QID) | ORAL | 0 refills | Status: DC | PRN

## 2019-11-23 MED FILL — oxyCODONE HCL 5 MG TABS: 5 | 4 days supply | Qty: 15 | Fill #0

## 2019-11-23 MED FILL — ONDANSETRON ODT 4 MG TABLET: 4 | 5 days supply | Qty: 20 | Fill #0

## 2019-11-23 NOTE — Discharge Summary (Signed)
    Patient ID: Micheal Watson [id number] 1961/07/28 59 y.o.  Admit date: 11/21/2019 Discharge date: 11/23/2019  Admitting Diagnosis: Acute cholecystitis  Discharge Diagnosis Acute Cholecystitis with hydrops  Consultants None  Reason for Admission: This is a 60 year old male with a history of heavy EtOH use presents with abdominal pain since last night.  Some shortness of breath.  He had a similar episode in 2020 with abnormal LFT's with T. Bili up to 7.4.  He was given a diagnosis of alcoholic hepatitis.  No stones were seen on [place] but sludge was noted on CT scan.  He was evaluated by EDP today.  [place] was unremarkable, but CT scan showed pericholecystic edema.  HIDA scan showed no filling of the gallbladder, but normal hepatic uptake.  He continues to have pain.  Procedures Dr. Grandville Silos - 11/22/19 - Laparoscopic Cholecystectomy   Hospital Course:  The patient was admitted and underwent a laparoscopic cholecystectomy.  The patient tolerated the procedure well.  On POD 1, the patient was tolerating a diet, voiding well, mobilizing, and pain was controlled with oral pain medications.  The patient was stable for DC home at this time with appropriate follow up made. A letter was provided for work.    Allergies as of 11/23/2019      Reactions   Pork-derived Products Other (See Comments)   "I have pain in my stomach if I eat this"      Medication List    TAKE these medications   acetaminophen 325 MG tablet Commonly known as: TYLENOL Take 2 tablets (650 mg total) by mouth every 6 (six) hours as needed for mild pain or moderate pain.   amLODipine 10 MG tablet Commonly known as: NORVASC TAKE 1 TABLET BY MOUTH EVERY DAY   atorvastatin 40 MG tablet Commonly known as: LIPITOR TAKE 1 TABLET (40 MG TOTAL) BY MOUTH DAILY AT 6 PM.   docusate sodium 100 MG capsule Commonly known as: Colace Take 1 capsule (100 mg total) by mouth 2 (two) times daily as needed.   ondansetron 4 MG  disintegrating tablet Commonly known as: ZOFRAN-ODT Take 1 tablet (4 mg total) by mouth every 6 (six) hours as needed for nausea.   oxyCODONE 5 MG immediate release tablet Commonly known as: Oxy IR/ROXICODONE Take 1 tablet (5 mg total) by mouth every 6 (six) hours as needed for breakthrough pain.   polyethylene glycol 17 g packet Commonly known as: MIRALAX / GLYCOLAX Take 17 g by mouth daily as needed.        Follow-up Information    [place] [facility]. Call.   Why: follow up appointment Dec 14, 2019 at 2:30 pm  Contact information: [address] [place] [phone]       Surgery, [place]. Go on 12/14/2019.   Specialty: General Surgery Why: 05/11 a las 10:15 am. [place] 30 minutos antes de su cita para el papeleo. Sheela Stack una copia de su identificacin con foto y tarjeta de seguro.  Contact information: [address] [place] [address] [phone]           Signed: Alferd Apa, [facility] Surgery 11/23/2019, 3:00 PM Please see Amion for pager number during day hours 7:00am-4:30pm

## 2019-11-23 NOTE — Plan of Care (Signed)
  Problem: Education: Goal: Knowledge of General Education information will improve Description: Including pain rating scale, medication(s)/side effects and non-pharmacologic comfort measures Outcome: Completed/Met   Problem: Health Behavior/Discharge Planning: Goal: Ability to manage health-related needs will improve Outcome: Completed/Met   Problem: Clinical Measurements: Goal: Ability to maintain clinical measurements within normal limits will improve Outcome: Completed/Met Goal: Will remain free from infection Outcome: Completed/Met Goal: Diagnostic test results will improve Outcome: Completed/Met Goal: Respiratory complications will improve Outcome: Completed/Met Goal: Cardiovascular complication will be avoided Outcome: Completed/Met   Problem: Activity: Goal: Risk for activity intolerance will decrease Outcome: Completed/Met   Problem: Nutrition: Goal: Adequate nutrition will be maintained Outcome: Completed/Met   Problem: Coping: Goal: Level of anxiety will decrease Outcome: Completed/Met   Problem: Elimination: Goal: Will not experience complications related to bowel motility Outcome: Completed/Met Goal: Will not experience complications related to urinary retention Outcome: Completed/Met   Problem: Pain Managment: Goal: General experience of comfort will improve Outcome: Completed/Met   Problem: Safety: Goal: Ability to remain free from injury will improve Outcome: Completed/Met   Problem: Skin Integrity: Goal: Risk for impaired skin integrity will decrease Outcome: Completed/Met  Discharge instructions reviewed with patient with aide of visual translator.  This discussion included, but was not all inclusive of, the following:  current prescription medications, new medications and rational for taking, recommended activity (including lifting restrictions), incision care (including monitoring of incision sites for inflammation / swelling / induration /  redness), activity recommendation, pain medication use and use of alternative methods of pain control (I.e. ice packs), follow-up appointments, etc.  Patient currently waiting for his employer to pick him up.

## 2019-11-23 NOTE — Progress Notes (Signed)
1 Day Post-Op   Subjective/Chief Complaint: Eating Quite sore Has not walked yet   Objective: Vital signs in last 24 hours: Temp:  [97.6 F (36.4 C)-98.6 F (37 C)] 98.1 F (36.7 C) (04/20 0634) Pulse Rate:  [71-84] 72 (04/20 0634) Resp:  [16-20] 18 (04/20 0634) BP: (126-145)/(75-92) 144/82 (04/20 0634) SpO2:  [91 %-96 %] 95 % (04/20 0634)    Intake/Output from previous day: 04/19 0701 - 04/20 0700 In: 3254.6 [P.O.:670; I.V.:2584.6] Out: [id number] [Urine:3150; Blood:20] Intake/Output this shift: No intake/output data recorded.  General appearance: cooperative Resp: clear to auscultation bilaterally GI: soft, incisions OK, some contusion lateral to umbilical incision  Lab Results:  Recent Labs    11/21/19 0046 11/22/19 0709  WBC 15.8* 11.5*  HGB 15.3 14.4  HCT 46.4 44.5  PLT 195 175   BMET Recent Labs    11/21/19 0046 11/22/19 0709  NA 136 139  K 3.5 3.9  CL 99 101  CO2 23 28  GLUCOSE 161* 100*  BUN 5* 6  CREATININE 0.74 0.78  CALCIUM 8.7* 8.1*   PT/INR Recent Labs    11/21/19 1835  LABPROT 15.9*  INR 1.3*   ABG No results for input(s): PHART, HCO3 in the last 72 hours.  Invalid input(s): PCO2, PO2  Studies/Results: NM Hepatobiliary Liver Func  Result Date: 11/21/2019 CLINICAL DATA:  Cholecystitis.  Generalized abdominal pain. EXAM: NUCLEAR MEDICINE HEPATOBILIARY IMAGING TECHNIQUE: Sequential images of the abdomen were obtained out to 60 minutes following intravenous administration of radiopharmaceutical. RADIOPHARMACEUTICALS:  5.4 mCi Tc-30m  Choletec IV COMPARISON:  CT and ultrasound earlier this day. FINDINGS: Prompt uptake and biliary excretion of activity by the liver is seen. Gallbladder activity not visualized. There is activity within the common bile duct and small bowel. Patient was administered 3 mg morphine IV. Imaging obtained for 30 minutes after morphine injection. Gallbladder is not visualized after morphine injection. IMPRESSION: 1.  Gallbladder is not visualized either before or after morphine injection compatible with obstruction of the cystic duct and acute cholecystitis. 2. Normal hepatic uptake. There is activity within the common bile duct in small bowel which confirms common bile duct patency. Electronically Signed   By: Keith Rake M.D.   On: 11/21/2019 17:34   CT ABDOMEN PELVIS W CONTRAST  Result Date: 11/21/2019 CLINICAL DATA:  Generalized abdominal pain radiating to left flank since last night. Nausea. EXAM: CT ABDOMEN AND PELVIS WITH CONTRAST TECHNIQUE: Multidetector CT imaging of the abdomen and pelvis was performed using the standard protocol following bolus administration of intravenous contrast. CONTRAST:  169mL OMNIPAQUE IOHEXOL 300 MG/ML  SOLN COMPARISON:  Abdominal ultrasound of earlier today. Prior CT of 04/09/2019. FINDINGS: Lower chest: Motion degradation at the lung bases. Mild nodularity at the anterior right lung base is unchanged and likely due to tiny granulomas. Mild cardiomegaly, without pericardial or pleural effusion. Hepatobiliary: Mild motion continuing into the upper abdomen. Moderate hepatic steatosis and marked hepatomegaly at 22.5 cm craniocaudal. Caudate and lateral segment left liver lobe prominence, nonspecific. Vague area of hypoattenuation within the right hepatic lobe at 1.6 cm on 34/3 may represent more focal steatosis. The gallbladder is distended. No calcified stones. Subtle pericholecystic edema, including on 43/3. No biliary duct dilatation. Pancreas: No pancreatic duct dilatation or focal pancreatic abnormality. Spleen: Normal in size, without focal abnormality. Adrenals/Urinary Tract: Normal adrenal glands. Normal kidneys, without hydronephrosis. Normal urinary bladder. Stomach/Bowel: Normal stomach, without wall thickening. Normal colon, appendix, and terminal ileum. Normal small bowel. Vascular/Lymphatic: Aortic atherosclerosis. No abdominopelvic adenopathy.  Reproductive: Normal  prostate. Other: Trace upper pelvic fluid, including on 74/3. No free intraperitoneal air. Musculoskeletal: Presumed degenerative cyst in the left femoral head. Heterotopic ossification about the right iliac is unchanged. IMPRESSION: 1. Mildly motion degraded exam. 2. Gallbladder distension with edema, felt to be centered about the gallbladder. Given absence of stones on ultrasound, acalculous cholecystitis is a consideration and nuclear medicine hepatobiliary study should be considered. Less likely explanations for right upper quadrant edema, including focal pancreatitis, warrant laboratory correlation. 3. Hepatic steatosis and marked hepatomegaly. Caudate and lateral segment left liver lobe prominence is nonspecific. Correlate with risk factors for mild cirrhosis. 4. More focal hypoattenuation in the right hepatic lobe may simply represent focal steatosis. If the patient is eventually diagnosed with cirrhosis, consider nonemergent outpatient pre and post contrast abdominal MRI. 5. Trace upper pelvic fluid, presumably secondary 6. Aortic Atherosclerosis (ICD10-I70.0). Electronically Signed   By: Abigail Miyamoto M.D.   On: 11/21/2019 12:00   DG Chest Portable 1 View  Result Date: 11/21/2019 CLINICAL DATA:  Chest and abdominal pain EXAM: PORTABLE CHEST 1 VIEW COMPARISON:  11/17/2018 chest radiograph. FINDINGS: Stable cardiomediastinal silhouette with top-normal heart size. No pneumothorax. No right pleural effusion. Stable blunting of the left costophrenic angle. No pulmonary edema. No acute consolidative airspace disease. IMPRESSION: Chronic blunting of the left costophrenic angle, favor scarring or atelectasis. Otherwise no active disease in the chest. Electronically Signed   By: Ilona Sorrel M.D.   On: 11/21/2019 09:58   US Abdomen Limited RUQ  Result Date: 11/21/2019 CLINICAL DATA:  Abdominal pain since yesterday. EXAM: ULTRASOUND ABDOMEN LIMITED RIGHT UPPER QUADRANT COMPARISON:  04/09/2019 and the CT of  04/09/2019 FINDINGS: Gallbladder: No gallstones or wall thickening visualized. No sonographic Murphy sign noted by sonographer. Common bile duct: Diameter: Normal, 3 mm. Liver: Moderately increased in echogenicity with mild heterogeneity. Portal vein is patent on color Doppler imaging with normal direction of blood flow towards the liver. Other: None. IMPRESSION: 1.  No acute process or explanation for abdominal pain. 2. Heterogeneous hepatic steatosis. Electronically Signed   By: Abigail Miyamoto M.D.   On: 11/21/2019 09:57    Anti-infectives: Anti-infectives (From admission, onward)   Start     Dose/Rate Route Frequency Ordered Stop   11/22/19 2000  cefTRIAXone (ROCEPHIN) 2 g in sodium chloride 0.9 % 100 mL IVPB     2 g 200 mL/hr over 30 Minutes Intravenous Every 24 hours 11/22/19 1601 11/22/19 2039   11/21/19 1945  cefTRIAXone (ROCEPHIN) 2 g in sodium chloride 0.9 % 100 mL IVPB  Status:  Discontinued     2 g 200 mL/hr over 30 Minutes Intravenous Every 24 hours 11/21/19 1931 11/22/19 1601      Assessment/Plan: Cholecystitis with hydrops - S/P lap chole 4/19 Ambulate Will check this PM for D/C  LOS: 0 days    Zenovia Jarred 11/23/2019

## 2019-11-28 ENCOUNTER — Inpatient Hospital Stay (HOSPITAL_COMMUNITY)
Admission: EM | Admit: 2019-11-28 | Discharge: 2019-12-09 | DRG: 421 | Disposition: A | Discharge status: Home / Self Care | Payer: Self-pay | Attending: General Surgery | Admitting: General Surgery

## 2019-11-28 ENCOUNTER — Emergency Department (HOSPITAL_COMMUNITY): Payer: Self-pay

## 2019-11-28 ENCOUNTER — Encounter (HOSPITAL_COMMUNITY): Payer: Self-pay | Admitting: Emergency Medicine

## 2019-11-28 ENCOUNTER — Other Ambulatory Visit: Payer: Self-pay

## 2019-11-28 DIAGNOSIS — E785 Hyperlipidemia, unspecified: Secondary | ICD-10-CM | POA: Diagnosis present

## 2019-11-28 DIAGNOSIS — D72829 Elevated white blood cell count, unspecified: Secondary | ICD-10-CM

## 2019-11-28 DIAGNOSIS — Z91018 Allergy to other foods: Secondary | ICD-10-CM

## 2019-11-28 DIAGNOSIS — E669 Obesity, unspecified: Secondary | ICD-10-CM | POA: Diagnosis present

## 2019-11-28 DIAGNOSIS — K8309 Other cholangitis: Secondary | ICD-10-CM

## 2019-11-28 DIAGNOSIS — Z6831 Body mass index (BMI) 31.0-31.9, adult: Secondary | ICD-10-CM

## 2019-11-28 DIAGNOSIS — R188 Other ascites: Secondary | ICD-10-CM | POA: Diagnosis present

## 2019-11-28 DIAGNOSIS — Z7289 Other problems related to lifestyle: Secondary | ICD-10-CM

## 2019-11-28 DIAGNOSIS — K831 Obstruction of bile duct: Principal | ICD-10-CM | POA: Diagnosis present

## 2019-11-28 DIAGNOSIS — Z79899 Other long term (current) drug therapy: Secondary | ICD-10-CM

## 2019-11-28 DIAGNOSIS — I10 Essential (primary) hypertension: Secondary | ICD-10-CM | POA: Diagnosis present

## 2019-11-28 DIAGNOSIS — R7303 Prediabetes: Secondary | ICD-10-CM | POA: Diagnosis present

## 2019-11-28 DIAGNOSIS — Z87891 Personal history of nicotine dependence: Secondary | ICD-10-CM

## 2019-11-28 DIAGNOSIS — K838 Other specified diseases of biliary tract: Secondary | ICD-10-CM | POA: Diagnosis present

## 2019-11-28 DIAGNOSIS — Z8616 Personal history of COVID-19: Secondary | ICD-10-CM

## 2019-11-28 DIAGNOSIS — Z20822 Contact with and (suspected) exposure to covid-19: Secondary | ICD-10-CM | POA: Diagnosis present

## 2019-11-28 DIAGNOSIS — K429 Umbilical hernia without obstruction or gangrene: Secondary | ICD-10-CM | POA: Diagnosis present

## 2019-11-28 LAB — CBC
HCT: 49.9 % (ref 39.0–52.0)
Hemoglobin: 16.4 g/dL (ref 13.0–17.0)
MCH: 33.5 pg (ref 26.0–34.0)
MCHC: 32.9 g/dL (ref 30.0–36.0)
MCV: 102 fL — ABNORMAL HIGH (ref 80.0–100.0)
Platelets: 349 10*3/uL (ref 150–400)
RBC: 4.89 MIL/uL (ref 4.22–5.81)
RDW: 13.6 % (ref 11.5–15.5)
WBC: 17.7 10*3/uL — ABNORMAL HIGH (ref 4.0–10.5)
nRBC: 0 % (ref 0.0–0.2)

## 2019-11-28 LAB — URINALYSIS, ROUTINE W REFLEX MICROSCOPIC
Bilirubin Urine: NEGATIVE
Glucose, UA: NEGATIVE mg/dL
Hgb urine dipstick: NEGATIVE
Ketones, ur: NEGATIVE mg/dL
Leukocytes,Ua: NEGATIVE
Nitrite: NEGATIVE
Protein, ur: NEGATIVE mg/dL
Specific Gravity, Urine: 1.017 (ref 1.005–1.030)
pH: 7 (ref 5.0–8.0)

## 2019-11-28 LAB — COMPREHENSIVE METABOLIC PANEL
ALT: 64 U/L — ABNORMAL HIGH (ref 0–44)
AST: 99 U/L — ABNORMAL HIGH (ref 15–41)
Albumin: 2.9 g/dL — ABNORMAL LOW (ref 3.5–5.0)
Alkaline Phosphatase: 241 U/L — ABNORMAL HIGH (ref 38–126)
Anion gap: 11 (ref 5–15)
BUN: 9 mg/dL (ref 6–20)
CO2: 21 mmol/L — ABNORMAL LOW (ref 22–32)
Calcium: 8.7 mg/dL — ABNORMAL LOW (ref 8.9–10.3)
Chloride: 101 mmol/L (ref 98–111)
Creatinine, Ser: 0.98 mg/dL (ref 0.61–1.24)
GFR calc Af Amer: >60 mL/min (ref >60)
GFR calc non Af Amer: >60 mL/min (ref >60)
Glucose, Bld: 154 mg/dL — ABNORMAL HIGH (ref 70–99)
Potassium: 3.9 mmol/L (ref 3.5–5.1)
Sodium: 133 mmol/L — ABNORMAL LOW (ref 135–145)
Total Bilirubin: 7.9 mg/dL — ABNORMAL HIGH (ref 0.3–1.2)
Total Protein: 6.9 g/dL (ref 6.5–8.1)

## 2019-11-28 LAB — LACTIC ACID, PLASMA
Lactic Acid, Venous: 2.4 mmol/L (ref 0.5–1.9)
Lactic Acid, Venous: 1.9 mmol/L (ref 0.5–1.9)

## 2019-11-28 LAB — PROTIME-INR
INR: 1.2 (ref 0.8–1.2)
Prothrombin Time: 15.1 seconds (ref 11.4–15.2)

## 2019-11-28 LAB — RESPIRATORY PANEL BY RT PCR (FLU A&B, COVID)
Influenza A by PCR: NEGATIVE
Influenza B by PCR: NEGATIVE
SARS Coronavirus 2 by RT PCR: NEGATIVE

## 2019-11-28 LAB — APTT: aPTT: 33 seconds (ref 24–36)

## 2019-11-28 LAB — LIPASE, BLOOD: Lipase: 66 U/L — ABNORMAL HIGH (ref 11–51)

## 2019-11-28 MED ORDER — HYDRALAZINE HCL 20 MG/ML IJ SOLN: 10.0000 mg | INTRAMUSCULAR | Status: DC | PRN

## 2019-11-28 MED ORDER — MORPHINE SULFATE (PF) 4 MG/ML IV SOLN
4.0000 mg | Freq: Once | INTRAVENOUS | Status: DC
  Filled 2019-11-28 (×2): qty 1

## 2019-11-28 MED ORDER — PANTOPRAZOLE SODIUM 40 MG IV SOLR
40.0000 mg | Freq: Every day | INTRAVENOUS | Status: DC
  Administered 2019-11-28 – 2019-11-30 (×2): 40 mg via INTRAVENOUS
  Filled 2019-11-28 (×2): qty 40

## 2019-11-28 MED ORDER — ONDANSETRON 4 MG PO TBDP: 4.0000 mg | ORAL_TABLET | Freq: Four times a day (QID) | ORAL | Status: DC | PRN

## 2019-11-28 MED ORDER — LACTATED RINGERS IV BOLUS (SEPSIS)
1000.0000 mL | Freq: Once | INTRAVENOUS | Status: AC
  Administered 2019-11-28: 1000 mL via INTRAVENOUS

## 2019-11-28 MED ORDER — AMLODIPINE BESYLATE 10 MG PO TABS
10.0000 mg | ORAL_TABLET | Freq: Every day | ORAL | Status: DC
  Administered 2019-11-28 – 2019-12-09 (×12): 10 mg via ORAL
  Filled 2019-11-28 (×9): qty 1
  Filled 2019-11-28: qty 2
  Filled 2019-11-28 (×2): qty 1

## 2019-11-28 MED ORDER — ONDANSETRON HCL 4 MG/2ML IJ SOLN: 4.0000 mg | Freq: Four times a day (QID) | INTRAMUSCULAR | Status: DC | PRN

## 2019-11-28 MED ORDER — IOHEXOL 300 MG/ML  SOLN
100.0000 mL | Freq: Once | INTRAMUSCULAR | Status: AC | PRN
  Administered 2019-11-28: 100 mL via INTRAVENOUS

## 2019-11-28 MED ORDER — ONDANSETRON HCL 4 MG/2ML IJ SOLN
4.0000 mg | Freq: Once | INTRAMUSCULAR | Status: DC
  Filled 2019-11-28: qty 2

## 2019-11-28 MED ORDER — SODIUM CHLORIDE 0.9% FLUSH
3.0000 mL | Freq: Once | INTRAVENOUS | Status: AC
  Administered 2019-11-28: 3 mL via INTRAVENOUS

## 2019-11-28 MED ORDER — DEXTROSE-NACL 5-0.9 % IV SOLN: INTRAVENOUS | Status: DC

## 2019-11-28 MED ORDER — SODIUM CHLORIDE 0.9 % IV SOLN
2.0000 g | Freq: Three times a day (TID) | INTRAVENOUS | Status: DC
  Administered 2019-11-28 – 2019-12-03 (×15): 2 g via INTRAVENOUS
  Filled 2019-11-28 (×15): qty 2

## 2019-11-28 MED ORDER — METRONIDAZOLE IN NACL 5-0.79 MG/ML-% IV SOLN
500.0000 mg | Freq: Once | INTRAVENOUS | Status: AC
  Administered 2019-11-28: 500 mg via INTRAVENOUS
  Filled 2019-11-28: qty 100

## 2019-11-28 MED ORDER — LACTATED RINGERS IV BOLUS (SEPSIS)
1000.0000 mL | Freq: Once | INTRAVENOUS | Status: AC
  Administered 2019-11-28: 18:00:00 1000 mL via INTRAVENOUS

## 2019-11-28 MED ORDER — HYDROMORPHONE HCL 1 MG/ML IJ SOLN
1.0000 mg | INTRAMUSCULAR | Status: DC | PRN
  Administered 2019-11-29: 1 mg via INTRAVENOUS
  Filled 2019-11-28: qty 1

## 2019-11-28 NOTE — H&P (Signed)
Micheal Watson is an 59 y.o. male.   Chief Complaint: Abdominal pain HPI: Patient is a 59 year old male postop day 6 from lap chole by Dr. Grandville Silos.  Patient states that he has had 4 days of abdominal pain, umbilical bile leakage, and dark urine.  Patient came today to the ER secondary to continued abdominal pain.  Patient denies any nausea or vomiting.  Patient states he has been having normal bowel movements and tolerating p.o. well.  Patient states that he has been having significant drainage from his umbilical port site.  The drainage is soaked up several T-shirts on an overnight basis.  Upon evaluation the ER he underwent CT scan.  CT scan was significant for umbilical hernia, mild enhancement of the common bile duct and cystic duct remnant walls to the cholecystectomy site.  There was no overt fluid collection intra-abdominally.  I did review the CT scan personally.  Patient had a leukocytosis.  Patient's T bili was 7.9.  Patient also with elevated transaminases.    Past Medical History:  Diagnosis Date  . Hepatitis   . Hyperlipidemia   . Hypertension   . Pneumonia   . Pre-diabetes     Past Surgical History:  Procedure Laterality Date  . CHOLECYSTECTOMY N/A 11/22/2019   Procedure: LAPAROSCOPIC CHOLECYSTECTOMY;  Surgeon: Georganna Skeans, MD;  Location: [facility] OR;  Service: General;  Laterality: N/A;    Family History  Problem Relation Age of Onset  . CVA Father    Social History:  reports that he quit smoking about 13 months ago. His smoking use included cigarettes. He smoked 0.30 packs per day. He has never used smokeless tobacco. He reports current alcohol use of about 2.0 standard drinks of alcohol per week. No history on file for drug.  Allergies:  Allergies  Allergen Reactions  . Pork-Derived Products Other (See Comments)    "I have pain in my stomach if I eat this"    (Not in a hospital admission)   Results for orders placed or performed during the hospital  encounter of 11/28/19 (from the past 48 hour(s))  Lipase, blood     Status: Abnormal   Collection Time: 11/28/19  1:24 PM  Result Value Ref Range   Lipase 66 (H) 11 - 51 U/L    Comment: Performed at [facility], [address], [place], [place] [address]  Comprehensive metabolic panel     Status: Abnormal   Collection Time: 11/28/19  1:24 PM  Result Value Ref Range   Sodium 133 (L) 135 - 145 mmol/L   Potassium 3.9 3.5 - 5.1 mmol/L   Chloride 101 98 - 111 mmol/L   CO2 21 (L) 22 - 32 mmol/L   Glucose, Bld 154 (H) 70 - 99 mg/dL    Comment: Glucose reference range applies only to samples taken after fasting for at least 8 hours.   BUN 9 6 - 20 mg/dL   Creatinine, Ser 0.98 0.61 - 1.24 mg/dL   Calcium 8.7 (L) 8.9 - 10.3 mg/dL   Total Protein 6.9 6.5 - 8.1 g/dL   Albumin 2.9 (L) 3.5 - 5.0 g/dL   AST 99 (H) 15 - 41 U/L   ALT 64 (H) 0 - 44 U/L   Alkaline Phosphatase 241 (H) 38 - 126 U/L   Total Bilirubin 7.9 (H) 0.3 - 1.2 mg/dL   GFR calc non Af Amer >60 >60 mL/min   GFR calc Af Amer >60 >60 mL/min   Anion gap 11 5 -  15    Comment: Performed at [facility], [place] [address] [place], [place] [address]  CBC     Status: Abnormal   Collection Time: 11/28/19  1:24 PM  Result Value Ref Range   WBC 17.7 (H) 4.0 - 10.5 K/uL   RBC 4.89 4.22 - 5.81 MIL/uL   Hemoglobin 16.4 13.0 - 17.0 g/dL   HCT 49.9 39.0 - 52.0 %   MCV 102.0 (H) 80.0 - 100.0 fL   MCH 33.5 26.0 - 34.0 pg   MCHC 32.9 30.0 - 36.0 g/dL   RDW 13.6 11.5 - 15.5 %   Platelets 349 150 - 400 K/uL   nRBC 0.0 0.0 - 0.2 %    Comment: Performed at [facility], [place] [address], [place], [place] [address]  Lactic acid, plasma     Status: Abnormal   Collection Time: 11/28/19  1:24 PM  Result Value Ref Range   Lactic Acid, Venous 2.4 (HH) 0.5 - 1.9 mmol/L    Comment: CRITICAL RESULT CALLED TO, READ BACK BY AND VERIFIED WITH: Felicita Gage RN AT 1409 11/28/19 BY WOOLLENK Performed at [facility], [address] [place], [place] [address]   Lactic acid, plasma     Status: None   Collection Time: 11/28/19  5:35 PM  Result Value Ref Range   Lactic Acid, Venous 1.9 0.5 - 1.9 mmol/L    Comment: Performed at [facility] [address] [place], [place] [address]  APTT     Status: None   Collection Time: 11/28/19  5:35 PM  Result Value Ref Range   aPTT 33 24 - 36 seconds    Comment: Performed at [facility] [address] [place], [place] [address]  Protime-INR     Status: None   Collection Time: 11/28/19  5:35 PM  Result Value Ref Range   Prothrombin Time 15.1 11.4 - 15.2 seconds   INR 1.2 0.8 - 1.2    Comment: (NOTE) INR goal varies based on device and disease states. Performed at [facility], [place] [address] [place], [place] [address]   Respiratory Panel by RT PCR (Flu A&B, Covid) - Nasopharyngeal Swab     Status: None   Collection Time: 11/28/19  6:32 PM   Specimen: Nasopharyngeal Swab  Result Value Ref Range   SARS Coronavirus 2 by RT PCR NEGATIVE NEGATIVE    Comment: (NOTE) SARS-CoV-2 target nucleic acids are NOT DETECTED. The SARS-CoV-2 RNA is generally detectable in upper respiratoy specimens during the acute phase of infection. The lowest concentration of SARS-CoV-2 viral copies this assay can detect is 131 copies/mL. A negative result does not preclude SARS-Cov-2 infection and should not be used as the sole basis for treatment or other patient management decisions. A negative result may occur with  improper specimen collection/handling, submission of specimen other than nasopharyngeal swab, presence of viral mutation(s) within the areas targeted by this assay, and inadequate number of viral copies (<131 copies/mL). A negative result must be combined with clinical observations, patient history, and epidemiological information. The expected result is Negative. Fact Sheet for Patients:  PinkCheek.be Fact Sheet for Healthcare  Providers:  GravelBags.it This test is not yet ap proved or cleared by the [place] FDA and  has been authorized for detection and/or diagnosis of SARS-CoV-2 by FDA under an Emergency Use Authorization (EUA). This EUA will remain  in effect (meaning this test can be used) for the duration  of the COVID-19 declaration under Section 564(b)(1) of the Act, 21 U.S.C. section 360bbb-3(b)(1), unless the authorization is terminated or revoked sooner.    Influenza A by PCR NEGATIVE NEGATIVE   Influenza B by PCR NEGATIVE NEGATIVE    Comment: (NOTE) The Xpert Xpress SARS-CoV-2/FLU/RSV assay is intended as an aid in  the diagnosis of influenza from Nasopharyngeal swab specimens and  should not be used as a sole basis for treatment. Nasal washings and  aspirates are unacceptable for Xpert Xpress SARS-CoV-2/FLU/RSV  testing. Fact Sheet for Patients: PinkCheek.be Fact Sheet for Healthcare Providers: GravelBags.it This test is not yet approved or cleared by the [place] FDA and  has been authorized for detection and/or diagnosis of SARS-CoV-2 by  FDA under an Emergency Use Authorization (EUA). This EUA will remain  in effect (meaning this test can be used) for the duration of the  Covid-19 declaration under Section 564(b)(1) of the Act, 21  U.S.C. section 360bbb-3(b)(1), unless the authorization is  terminated or revoked. Performed at [facility], [place] [address], [place], [place] [address]    CT ABDOMEN PELVIS W CONTRAST  Result Date: 11/28/2019 CLINICAL DATA:  59 year old male with abdominal and pelvic pain, recent cholecystectomy on 11/22/2019. Drainage from abdominal wound. EXAM: CT ABDOMEN AND PELVIS WITH CONTRAST TECHNIQUE: Multidetector CT imaging of the abdomen and pelvis was performed using the standard protocol following bolus administration of intravenous contrast. CONTRAST:  155mL  OMNIPAQUE IOHEXOL 300 MG/ML  SOLN COMPARISON:  11/21/2019 CT and prior studies FINDINGS: Lower chest: No acute abnormality Hepatobiliary: Probable hepatic steatosis noted. Equivocal nodularity of the hepatic contour is present. No focal hepatic lesions are present. Cholecystectomy identified. Stranding in the cholecystectomy bed noted without focal collection. Equivocal enhancement of the CBD and cystic duct remnant walls noted. No biliary dilatation. Pancreas: Unremarkable Spleen: Unremarkable Adrenals/Urinary Tract: The kidneys, adrenal glands and bladder are unremarkable. Stomach/Bowel: Stomach is within normal limits. Appendix appears normal. No evidence of bowel wall thickening, distention, or inflammatory changes. Vascular/Lymphatic: No significant vascular findings are present. No enlarged abdominal or pelvic lymph nodes. Reproductive: Mild prostate enlargement again noted. Other: Stranding in the periumbilical subcutaneous tissues noted without focal abscess or gas. A small umbilical hernia containing fat is noted. No ascites. Musculoskeletal: No acute or suspicious bony abnormalities identified. IMPRESSION: 1. Stranding in the periumbilical subcutaneous tissues and cholecystectomy bed without focal abscess or gas. 2. Mild enhancement of the CBD and cystic duct remnant walls near the cholecystectomy site which may be postoperative inflammation but cholangitis is not excluded. No biliary dilatation. 3. Probable hepatic steatosis and equivocal nodularity of the hepatic contour. Correlate with any signs of cirrhosis. 4. Small umbilical hernia containing fat. Electronically Signed   By: Margarette Canada M.D.   On: 11/28/2019 18:48   DG Chest Port 1 View  Result Date: 11/28/2019 CLINICAL DATA:  Sepsis. EXAM: PORTABLE CHEST 1 VIEW COMPARISON:  11/21/2019 and prior radiographs FINDINGS: UPPER limits normal heart size and elevated RIGHT hemidiaphragm again noted. There is no evidence of focal airspace disease,  pulmonary edema, suspicious pulmonary nodule/mass, pleural effusion, or pneumothorax. No acute bony abnormalities are identified. IMPRESSION: No evidence of acute cardiopulmonary disease. Electronically Signed   By: Margarette Canada M.D.   On: 11/28/2019 17:24    Review of Systems  Constitutional: Negative for chills and fever.  HENT: Negative for ear discharge, hearing loss and sore throat.   Eyes: Negative for discharge.  Respiratory: Negative for cough and shortness of breath.  Cardiovascular: Negative for chest pain and leg swelling.  Gastrointestinal: Positive for abdominal pain. Negative for constipation, diarrhea, nausea and vomiting.  Musculoskeletal: Negative for myalgias and neck pain.  Skin: Negative for rash.  Allergic/Immunologic: Negative for environmental allergies.  Neurological: Negative for dizziness and seizures.  Hematological: Does not bruise/bleed easily.  Psychiatric/Behavioral: Negative for suicidal ideas.  All other systems reviewed and are negative.   Blood pressure 135/87, pulse 77, temperature 99.1 F (37.3 C), temperature source Rectal, resp. rate 15, SpO2 95 %. Physical Exam  Constitutional: He is oriented to person, place, and time. Vital signs are normal. He appears well-developed and well-nourished.  Conversant No acute distress  Eyes: Lids are normal. No scleral icterus.  Pupils are equal round and reactive No lid lag Moist conjunctiva  Neck: No tracheal tenderness present. No thyromegaly present.  No cervical lymphadenopathy  Cardiovascular: Normal rate, regular rhythm and intact distal pulses.  No murmur heard. Respiratory: Effort normal and breath sounds normal. He has no wheezes. He has no rales.  GI: Soft. He exhibits distension. There is no hepatosplenomegaly. There is abdominal tenderness (at umb). There is rebound and guarding. No hernia.  Bilious drainage from umb port site   Neurological: He is alert and oriented to person, place, and time.   Normal gait and station  Skin: Skin is warm. No rash noted. No cyanosis. Nails show no clubbing.  Normal skin turgor  Psychiatric: Judgment normal.  Appropriate affect     Assessment/Plan 59 year old male status post lap chole with likely bile leak 1.  Agree with antibiotics. 2.  We will admit, keep n.p.o.  Patient will likely require GI consult for likely ERCP and stent placement. 3.  We will discuss with oncoming surgeon Dr. Ninfa Linden.  Ralene Ok, MD 11/28/2019, 7:58 PM

## 2019-11-28 NOTE — ED Provider Notes (Signed)
[place] EMERGENCY DEPARTMENT Provider Note   CSN: [id number] Arrival date & time: 11/28/19  1234     History Chief Complaint  Patient presents with  . wound infection    Micheal Watson is a 59 y.o. male.  The history is provided by the patient and medical records. The history is limited by a language barrier. A language interpreter was used.     59 year old male with history of alcohol abuse, recent acute cholecystitis requiring cholecystectomy on 4/19, hepatitis, hypertension, prediabetes, presenting for evaluation of abdominal pain.  Patient is Spanish-speaking, history obtained through language interpreter.  Patient report he had his surgery 6 days ago.  After being discharged, he noticed persistent drainage coming from one of his surgical site near his belly.  It is oozing out fluid, and he endorsed progressive worsening sharp pain throughout his abdomen.  Pain is been waxing waning but sometimes pretty intense.  He worries that he is having infection.  He does endorse some subjective fever.  He denies nausea vomiting or diarrhea.  Last bowel movement was today and was normal.  No pain with eating.  Denies any recent alcohol use.  He does notice his abdomen is more distended than usual and at nighttime when he lays down he feels short of breath but improves when he sits up.  Denies any specific treatment tried at home aside from medication that was prescribed to him.  Past Medical History:  Diagnosis Date  . Hepatitis   . Hyperlipidemia   . Hypertension   . Pneumonia   . Pre-diabetes     Patient Active Problem List   Diagnosis Date Noted  . Acute cholecystitis without calculus 11/21/2019  . Protein-calorie malnutrition ([place]) 04/11/2019  . Hyperammonemia ([place]) 04/09/2019  . Elevated LFTs   . Gynecomastia, male   . Hyperbilirubinemia   . Tremor of unknown origin   . COVID-19 virus infection 11/27/2018  . Acute respiratory disease due to COVID-19  virus 11/27/2018  . SIRS (systemic inflammatory response syndrome) ([place]) 11/25/2018  . Acute on chronic respiratory failure with hypoxia ([place]) 11/21/2018  . Suspected COVID-19 virus infection 11/17/2018    Past Surgical History:  Procedure Laterality Date  . CHOLECYSTECTOMY N/A 11/22/2019   Procedure: LAPAROSCOPIC CHOLECYSTECTOMY;  Surgeon: Georganna Skeans, MD;  Location: [facility] OR;  Service: General;  Laterality: N/A;       Family History  Problem Relation Age of Onset  . CVA Father     Social History   Tobacco Use  . Smoking status: Former Smoker    Packs/day: 0.30    Types: Cigarettes    Quit date: 10/08/2018    Years since quitting: 1.1  . Smokeless tobacco: Never Used  Substance Use Topics  . Alcohol use: Yes    Alcohol/week: 2.0 standard drinks    Types: 2 Cans of beer per week  . Drug use: Not on file    Home Medications Prior to Admission medications   Medication Sig Start Date End Date Taking? Authorizing Provider  acetaminophen (TYLENOL) 325 MG tablet Take 2 tablets (650 mg total) by mouth every 6 (six) hours as needed for mild pain or moderate pain. 11/23/19   Maczis, Barth Kirks, PA-C  amLODipine (NORVASC) 10 MG tablet TAKE 1 TABLET BY MOUTH EVERY DAY Patient not taking: Reported on 11/22/2019 05/03/19   Kathrene Alu, MD  atorvastatin (LIPITOR) 40 MG tablet TAKE 1 TABLET (40 MG TOTAL) BY MOUTH DAILY AT 6 PM. Patient not taking: Reported on  11/22/2019 05/03/19   Kathrene Alu, MD  docusate sodium (COLACE) 100 MG capsule Take 1 capsule (100 mg total) by mouth 2 (two) times daily as needed. 11/23/19 11/22/20  Maczis, Barth Kirks, PA-C  ondansetron (ZOFRAN-ODT) 4 MG disintegrating tablet Take 1 tablet (4 mg total) by mouth every 6 (six) hours as needed for nausea. 11/23/19   Maczis, Barth Kirks, PA-C  oxyCODONE (OXY IR/ROXICODONE) 5 MG immediate release tablet Take 1 tablet (5 mg total) by mouth every 6 (six) hours as needed for breakthrough pain. 11/23/19   Maczis, Barth Kirks,  PA-C  polyethylene glycol (MIRALAX / GLYCOLAX) 17 g packet Take 17 g by mouth daily as needed. 11/23/19   Maczis, Barth Kirks, PA-C    Allergies    Pork-derived products  Review of Systems   Review of Systems  All other systems reviewed and are negative.   Physical Exam Updated Vital Signs BP 130/84 (BP Location: Right Arm)   Pulse (!) 103   Temp 98.4 F (36.9 C) (Oral)   Resp 18   SpO2 96%   Physical Exam Vitals and nursing note reviewed.  Constitutional:      General: He is not in acute distress.    Appearance: He is well-developed.  HENT:     Head: Atraumatic.  Eyes:     Conjunctiva/sclera: Conjunctivae normal.  Cardiovascular:     Rate and Rhythm: Tachycardia present.     Pulses: Normal pulses.     Heart sounds: Normal heart sounds.  Pulmonary:     Effort: Pulmonary effort is normal.     Breath sounds: Normal breath sounds. No wheezing or rhonchi.  Abdominal:     General: There is distension.     Tenderness: There is abdominal tenderness. There is no guarding or rebound.     Comments: Abdomen is distended.  Laparoscopic surgical scars noted without surrounding skin infection however the surgical wound at the umbilicus is oozing out yellow/bilious content.  Bowel sounds present.  Musculoskeletal:     Cervical back: Neck supple.  Skin:    Findings: No rash.  Neurological:     Mental Status: He is alert.     ED Results / Procedures / Treatments   Labs (all labs ordered are listed, but only abnormal results are displayed) Labs Reviewed  LIPASE, BLOOD - Abnormal; Notable for the following components:      Result Value   Lipase 66 (*)    All other components within normal limits  COMPREHENSIVE METABOLIC PANEL - Abnormal; Notable for the following components:   Sodium 133 (*)    CO2 21 (*)    Glucose, Bld 154 (*)    Calcium 8.7 (*)    Albumin 2.9 (*)    AST 99 (*)    ALT 64 (*)    Alkaline Phosphatase 241 (*)    Total Bilirubin 7.9 (*)    All other  components within normal limits  CBC - Abnormal; Notable for the following components:   WBC 17.7 (*)    MCV 102.0 (*)    All other components within normal limits  LACTIC ACID, PLASMA - Abnormal; Notable for the following components:   Lactic Acid, Venous 2.4 (*)    All other components within normal limits  CULTURE, BLOOD (ROUTINE X 2)  CULTURE, BLOOD (ROUTINE X 2)  URINE CULTURE  RESPIRATORY PANEL BY RT PCR (FLU A&B, COVID)  LACTIC ACID, PLASMA  APTT  PROTIME-INR  URINALYSIS, ROUTINE W REFLEX MICROSCOPIC  EKG EKG Interpretation  Date/Time:  Sunday November 28 2019 17:31:26 EDT Ventricular Rate:  83 PR Interval:    QRS Duration: 94 QT Interval:  390 QTC Calculation: 459 R Axis:   64 Text Interpretation: Normal sinus rhythm Low voltage, precordial leads Abnormal R-wave progression, late transition Confirmed by Deno Etienne [phone]) on 11/28/2019 6:18:51 PM   Radiology CT ABDOMEN PELVIS W CONTRAST  Result Date: 11/28/2019 CLINICAL DATA:  59 year old male with abdominal and pelvic pain, recent cholecystectomy on 11/22/2019. Drainage from abdominal wound. EXAM: CT ABDOMEN AND PELVIS WITH CONTRAST TECHNIQUE: Multidetector CT imaging of the abdomen and pelvis was performed using the standard protocol following bolus administration of intravenous contrast. CONTRAST:  175m OMNIPAQUE IOHEXOL 300 MG/ML  SOLN COMPARISON:  11/21/2019 CT and prior studies FINDINGS: Lower chest: No acute abnormality Hepatobiliary: Probable hepatic steatosis noted. Equivocal nodularity of the hepatic contour is present. No focal hepatic lesions are present. Cholecystectomy identified. Stranding in the cholecystectomy bed noted without focal collection. Equivocal enhancement of the CBD and cystic duct remnant walls noted. No biliary dilatation. Pancreas: Unremarkable Spleen: Unremarkable Adrenals/Urinary Tract: The kidneys, adrenal glands and bladder are unremarkable. Stomach/Bowel: Stomach is within normal limits.  Appendix appears normal. No evidence of bowel wall thickening, distention, or inflammatory changes. Vascular/Lymphatic: No significant vascular findings are present. No enlarged abdominal or pelvic lymph nodes. Reproductive: Mild prostate enlargement again noted. Other: Stranding in the periumbilical subcutaneous tissues noted without focal abscess or gas. A small umbilical hernia containing fat is noted. No ascites. Musculoskeletal: No acute or suspicious bony abnormalities identified. IMPRESSION: 1. Stranding in the periumbilical subcutaneous tissues and cholecystectomy bed without focal abscess or gas. 2. Mild enhancement of the CBD and cystic duct remnant walls near the cholecystectomy site which may be postoperative inflammation but cholangitis is not excluded. No biliary dilatation. 3. Probable hepatic steatosis and equivocal nodularity of the hepatic contour. Correlate with any signs of cirrhosis. 4. Small umbilical hernia containing fat. Electronically Signed   By: JMargarette CanadaM.D.   On: 11/28/2019 18:48   DG Chest Port 1 View  Result Date: 11/28/2019 CLINICAL DATA:  Sepsis. EXAM: PORTABLE CHEST 1 VIEW COMPARISON:  11/21/2019 and prior radiographs FINDINGS: UPPER limits normal heart size and elevated RIGHT hemidiaphragm again noted. There is no evidence of focal airspace disease, pulmonary edema, suspicious pulmonary nodule/mass, pleural effusion, or pneumothorax. No acute bony abnormalities are identified. IMPRESSION: No evidence of acute cardiopulmonary disease. Electronically Signed   By: JMargarette CanadaM.D.   On: 11/28/2019 17:24    Procedures .Critical Care Performed by: TDomenic Moras PA-C Authorized by: TDomenic Moras PA-C   Critical care provider statement:    Critical care time (minutes):  40   Critical care was time spent personally by me on the following activities:  Discussions with consultants, evaluation of patient's response to treatment, examination of patient, ordering and performing  treatments and interventions, ordering and review of laboratory studies, ordering and review of radiographic studies, pulse oximetry, re-evaluation of patient's condition, obtaining history from patient or surrogate and review of old charts   (including critical care time)  Medications Ordered in ED Medications  ceFEPIme (MAXIPIME) 2 g in sodium chloride 0.9 % 100 mL IVPB (0 g Intravenous Stopped 11/28/19 1923)  metroNIDAZOLE (FLAGYL) IVPB 500 mg (has no administration in time range)  morphine 4 MG/ML injection 4 mg (has no administration in time range)  ondansetron (ZOFRAN) injection 4 mg (has no administration in time range)  sodium chloride flush (NS) 0.9 %  injection 3 mL (3 mLs Intravenous Given 11/28/19 1807)  lactated ringers bolus 1,000 mL (0 mLs Intravenous Stopped 11/28/19 1924)    And  lactated ringers bolus 1,000 mL (1,000 mLs Intravenous New Bag/Given 11/28/19 1744)    And  lactated ringers bolus 1,000 mL (1,000 mLs Intravenous New Bag/Given 11/28/19 1803)  iohexol (OMNIPAQUE) 300 MG/ML solution 100 mL (100 mLs Intravenous Contrast Given 11/28/19 1809)    ED Course  I have reviewed the triage vital signs and the nursing notes.  Pertinent labs & imaging results that were available during my care of the patient were reviewed by me and considered in my medical decision making (see chart for details).    MDM Rules/Calculators/A&P                      BP 131/86 (BP Location: Right Arm)   Pulse 83   Temp 99.1 F (37.3 C) (Rectal)   Resp 18   SpO2 94%   Final Clinical Impression(s) / ED Diagnoses Final diagnoses:  Cholangitis    Rx / DC Orders ED Discharge Orders    None     4:59 PM Patient recently diagnosed with acute cholecystitis status post laparoscopic cholecystectomy 6 days ago by Dr. Grandville Silos.  He is here due to abdominal pain as well as drainage coming from his surgical site.  It appears he has yellow/bilious drainage coming from his surgical site near the  umbilicus as well as diffuse abdominal discomfort on palpation.  Labs remarkable for mildly worsening transaminitis with AST 99, ALT 64, alk phos 241, and a total bili of 7.9.  Evidence of leukocytosis with a white count of 17.7.  Mildly elevated lipase of 66.  He also has an elevated lactic acid of 2.4.  Patient meets sepsis criteria, will initiate appropriate antibiotic, fluid resuscitation at 30 mL/kg, code sepsis initiated, abdominal pelvis CT scan ordered, will consult surgery as well.  7:19 PM Sepsis - Repeat Assessment  Sepsis hemodynamic reassessment completed at: 7:20 PM Improved lactic acid to 1.9 from 2.4 after IVF.  Abdominal pelvis CT scan demonstrate stranding in the periumbilical subcutaneous tissue and cholecystectomy but without focal abscess or gas.  Mild enhancement of the CBD and cystic duct remnants wall near the cholecystectomy site which may be post operative inflammation but cholangitis is not excluded.  No biliary dilatation.  We will consult surgery for evaluation and admission.  7:24 PM Appreciate consultation from general surgery, Dr. Rosendo Gros, who evaluated patient in the ER and determine disposition.  Micheal Watson was evaluated in Emergency Department on 11/28/2019 for the symptoms described in the history of present illness. He was evaluated in the context of the global COVID-19 pandemic, which necessitated consideration that the patient might be at risk for infection with the SARS-CoV-2 virus that causes COVID-19. Institutional protocols and algorithms that pertain to the evaluation of patients at risk for COVID-19 are in a state of rapid change based on information released by regulatory bodies including the CDC and federal and state organizations. These policies and algorithms were followed during the patient's care in the ED.    Domenic Moras, PA-C 11/28/19 [place], [place], DO 11/28/19 1934    Deno Etienne, DO 11/28/19 2001

## 2019-11-28 NOTE — ED Triage Notes (Signed)
Pt had cholecystectomy on 4/19.  C/o severe abd pain and yellow drainage from abd wound.

## 2019-11-28 NOTE — ED Notes (Signed)
To CT

## 2019-11-29 ENCOUNTER — Inpatient Hospital Stay (HOSPITAL_COMMUNITY): Payer: Self-pay | Admitting: Certified Registered"

## 2019-11-29 ENCOUNTER — Encounter (HOSPITAL_COMMUNITY): Admission: EM | Disposition: A | Discharge status: Home / Self Care | Payer: Self-pay | Source: Home / Self Care

## 2019-11-29 ENCOUNTER — Encounter (HOSPITAL_COMMUNITY): Payer: Self-pay | Admitting: General Surgery

## 2019-11-29 ENCOUNTER — Inpatient Hospital Stay (HOSPITAL_COMMUNITY): Payer: Self-pay

## 2019-11-29 HISTORY — PX: BILIARY STENT PLACEMENT: SHX5538

## 2019-11-29 HISTORY — PX: BILIARY DILATION: SHX6850

## 2019-11-29 HISTORY — PX: SPHINCTEROTOMY: SHX5544

## 2019-11-29 HISTORY — PX: BILIARY BRUSHING: SHX6843

## 2019-11-29 HISTORY — PX: ERCP: SHX5425

## 2019-11-29 LAB — COMPREHENSIVE METABOLIC PANEL
ALT: 51 U/L — ABNORMAL HIGH (ref 0–44)
AST: 72 U/L — ABNORMAL HIGH (ref 15–41)
Albumin: 2.4 g/dL — ABNORMAL LOW (ref 3.5–5.0)
Alkaline Phosphatase: 189 U/L — ABNORMAL HIGH (ref 38–126)
Anion gap: 9 (ref 5–15)
BUN: 8 mg/dL (ref 6–20)
CO2: 26 mmol/L (ref 22–32)
Calcium: 8.3 mg/dL — ABNORMAL LOW (ref 8.9–10.3)
Chloride: 102 mmol/L (ref 98–111)
Creatinine, Ser: 0.84 mg/dL (ref 0.61–1.24)
GFR calc Af Amer: >60 mL/min (ref >60)
GFR calc non Af Amer: >60 mL/min (ref >60)
Glucose, Bld: 118 mg/dL — ABNORMAL HIGH (ref 70–99)
Potassium: 3.9 mmol/L (ref 3.5–5.1)
Sodium: 137 mmol/L (ref 135–145)
Total Bilirubin: 7 mg/dL — ABNORMAL HIGH (ref 0.3–1.2)
Total Protein: 5.4 g/dL — ABNORMAL LOW (ref 6.5–8.1)

## 2019-11-29 LAB — CBC
HCT: 42.8 % (ref 39.0–52.0)
Hemoglobin: 13.9 g/dL (ref 13.0–17.0)
MCH: 32.7 pg (ref 26.0–34.0)
MCHC: 32.5 g/dL (ref 30.0–36.0)
MCV: 100.7 fL — ABNORMAL HIGH (ref 80.0–100.0)
Platelets: 291 10*3/uL (ref 150–400)
RBC: 4.25 MIL/uL (ref 4.22–5.81)
RDW: 13.7 % (ref 11.5–15.5)
WBC: 15.4 10*3/uL — ABNORMAL HIGH (ref 4.0–10.5)
nRBC: 0 % (ref 0.0–0.2)

## 2019-11-29 SURGERY — ERCP, WITH INTERVENTION IF INDICATED
Anesthesia: General

## 2019-11-29 MED ORDER — ONDANSETRON HCL 4 MG/2ML IJ SOLN
INTRAMUSCULAR | Status: DC | PRN
  Administered 2019-11-29: 4 mg via INTRAVENOUS

## 2019-11-29 MED ORDER — SUCCINYLCHOLINE CHLORIDE 20 MG/ML IJ SOLN
INTRAMUSCULAR | Status: DC | PRN
Start: 2019-11-29 — End: 2019-11-29
  Administered 2019-11-29: 160 mg via INTRAVENOUS

## 2019-11-29 MED ORDER — PROPOFOL 10 MG/ML IV BOLUS
INTRAVENOUS | Status: DC | PRN
  Administered 2019-11-29: 180 mg via INTRAVENOUS

## 2019-11-29 MED ORDER — GLUCAGON HCL RDNA (DIAGNOSTIC) 1 MG IJ SOLR
INTRAMUSCULAR | Status: AC
  Filled 2019-11-29: qty 1

## 2019-11-29 MED ORDER — DEXAMETHASONE SODIUM PHOSPHATE 10 MG/ML IJ SOLN
INTRAMUSCULAR | Status: DC | PRN
  Administered 2019-11-29: 4 mg via INTRAVENOUS

## 2019-11-29 MED ORDER — INDOMETHACIN 50 MG RE SUPP
RECTAL | Status: AC
  Filled 2019-11-29: qty 2

## 2019-11-29 MED ORDER — FENTANYL CITRATE (PF) 250 MCG/5ML IJ SOLN
INTRAMUSCULAR | Status: DC | PRN
  Administered 2019-11-29: 100 ug via INTRAVENOUS

## 2019-11-29 MED ORDER — SODIUM CHLORIDE 0.9 % IV SOLN
INTRAVENOUS | Status: DC | PRN
  Administered 2019-11-29: 13:00:00 40 mL

## 2019-11-29 MED ORDER — ROCURONIUM BROMIDE 10 MG/ML (PF) SYRINGE
PREFILLED_SYRINGE | INTRAVENOUS | Status: DC | PRN
  Administered 2019-11-29: 30 mg via INTRAVENOUS

## 2019-11-29 MED ORDER — CIPROFLOXACIN IN D5W 400 MG/200ML IV SOLN
INTRAVENOUS | Status: AC
  Filled 2019-11-29: qty 200

## 2019-11-29 MED ORDER — PHENYLEPHRINE 40 MCG/ML (10ML) SYRINGE FOR IV PUSH (FOR BLOOD PRESSURE SUPPORT)
PREFILLED_SYRINGE | INTRAVENOUS | Status: DC | PRN
  Administered 2019-11-29: 120 ug via INTRAVENOUS
  Administered 2019-11-29: 80 ug via INTRAVENOUS

## 2019-11-29 MED ORDER — LACTATED RINGERS IV SOLN: INTRAVENOUS | Status: DC

## 2019-11-29 NOTE — Progress Notes (Signed)
Patient to endo

## 2019-11-29 NOTE — Progress Notes (Signed)
Subjective/Chief Complaint: Patient reports better pain control this morning   Objective: Vital signs in last 24 hours: Temp:  [98.1 F (36.7 C)-99.1 F (37.3 C)] 98.1 F (36.7 C) (04/26 0426) Pulse Rate:  [65-103] 65 (04/26 0426) Resp:  [15-19] 16 (04/26 0426) BP: (110-135)/(75-88) 110/75 (04/26 0426) SpO2:  [93 %-98 %] 94 % (04/26 0426) Weight:  [96.4 kg] 96.4 kg (04/25 2356) Last BM Date: 11/28/19  Intake/Output from previous day: 04/25 0701 - 04/26 0700 In: 1100 [IV Piggyback:1100] Out: 250 [Urine:250] Intake/Output this shift: No intake/output data recorded.  Exam: Awake and alert Jaundiced Abdomen obese, soft, mildly tender without frank peritonitis No drainage from incision this morning  Lab Results:  Recent Labs    11/28/19 1324 11/29/19 0148  WBC 17.7* 15.4*  HGB 16.4 13.9  HCT 49.9 42.8  PLT 349 291   BMET Recent Labs    11/28/19 1324 11/29/19 0148  NA 133* 137  K 3.9 3.9  CL 101 102  CO2 21* 26  GLUCOSE 154* 118*  BUN 9 8  CREATININE 0.98 0.84  CALCIUM 8.7* 8.3*   PT/INR Recent Labs    11/28/19 1735  LABPROT 15.1  INR 1.2   ABG No results for input(s): PHART, HCO3 in the last 72 hours.  Invalid input(s): PCO2, PO2  Studies/Results: CT ABDOMEN PELVIS W CONTRAST  Result Date: 11/28/2019 CLINICAL DATA:  59 year old male with abdominal and pelvic pain, recent cholecystectomy on 11/22/2019. Drainage from abdominal wound. EXAM: CT ABDOMEN AND PELVIS WITH CONTRAST TECHNIQUE: Multidetector CT imaging of the abdomen and pelvis was performed using the standard protocol following bolus administration of intravenous contrast. CONTRAST:  170mL OMNIPAQUE IOHEXOL 300 MG/ML  SOLN COMPARISON:  11/21/2019 CT and prior studies FINDINGS: Lower chest: No acute abnormality Hepatobiliary: Probable hepatic steatosis noted. Equivocal nodularity of the hepatic contour is present. No focal hepatic lesions are present. Cholecystectomy identified. Stranding in  the cholecystectomy bed noted without focal collection. Equivocal enhancement of the CBD and cystic duct remnant walls noted. No biliary dilatation. Pancreas: Unremarkable Spleen: Unremarkable Adrenals/Urinary Tract: The kidneys, adrenal glands and bladder are unremarkable. Stomach/Bowel: Stomach is within normal limits. Appendix appears normal. No evidence of bowel wall thickening, distention, or inflammatory changes. Vascular/Lymphatic: No significant vascular findings are present. No enlarged abdominal or pelvic lymph nodes. Reproductive: Mild prostate enlargement again noted. Other: Stranding in the periumbilical subcutaneous tissues noted without focal abscess or gas. A small umbilical hernia containing fat is noted. No ascites. Musculoskeletal: No acute or suspicious bony abnormalities identified. IMPRESSION: 1. Stranding in the periumbilical subcutaneous tissues and cholecystectomy bed without focal abscess or gas. 2. Mild enhancement of the CBD and cystic duct remnant walls near the cholecystectomy site which may be postoperative inflammation but cholangitis is not excluded. No biliary dilatation. 3. Probable hepatic steatosis and equivocal nodularity of the hepatic contour. Correlate with any signs of cirrhosis. 4. Small umbilical hernia containing fat. Electronically Signed   By: Margarette Canada M.D.   On: 11/28/2019 18:48   DG Chest Port 1 View  Result Date: 11/28/2019 CLINICAL DATA:  Sepsis. EXAM: PORTABLE CHEST 1 VIEW COMPARISON:  11/21/2019 and prior radiographs FINDINGS: UPPER limits normal heart size and elevated RIGHT hemidiaphragm again noted. There is no evidence of focal airspace disease, pulmonary edema, suspicious pulmonary nodule/mass, pleural effusion, or pneumothorax. No acute bony abnormalities are identified. IMPRESSION: No evidence of acute cardiopulmonary disease. Electronically Signed   By: Margarette Canada M.D.   On: 11/28/2019 17:24    Anti-infectives:  Anti-infectives (From admission,  onward)   Start     Dose/Rate Route Frequency Ordered Stop   11/28/19 1745  ceFEPIme (MAXIPIME) 2 g in sodium chloride 0.9 % 100 mL IVPB     2 g 200 mL/hr over 30 Minutes Intravenous Every 8 hours 11/28/19 1705     11/28/19 1715  metroNIDAZOLE (FLAGYL) IVPB 500 mg     500 mg 100 mL/hr over 60 Minutes Intravenous  Once 11/28/19 1705 11/28/19 2130      Assessment/Plan: s/p Procedure(s): ENDOSCOPIC RETROGRADE CHOLANGIOPANCREATOGRAPHY (ERCP) (N/A)  Suspected post op bile leak given CT, Labs, and bilious drainage from the umbilicus.  Tibili still 7. Hopefully for ERCP today.  LOS: 1 day    Coralie Keens 11/29/2019

## 2019-11-29 NOTE — Consult Note (Signed)
Reason for Consult: Probable bile leak jaundice Referring Physician: Surgical team  Micheal Watson is an 59 y.o. male.  HPI: Patient seen and examined and discussed with the surgical team in his hospital computer chart reviewed and he was interviewed via the video interpreter and is actually doing better today and he was concerned that he had urine like substance coming from his bellybutton and he is still having some pain but has no other specific complaints today  Past Medical History:  Diagnosis Date  . Hepatitis   . Hyperlipidemia   . Hypertension   . Pneumonia   . Pre-diabetes     Past Surgical History:  Procedure Laterality Date  . CHOLECYSTECTOMY N/A 11/22/2019   Procedure: LAPAROSCOPIC CHOLECYSTECTOMY;  Surgeon: Georganna Skeans, MD;  Location: [facility] OR;  Service: General;  Laterality: N/A;    Family History  Problem Relation Age of Onset  . CVA Father     Social History:  reports that he quit smoking about 13 months ago. His smoking use included cigarettes. He smoked 0.30 packs per day. He has never used smokeless tobacco. He reports current alcohol use of about 2.0 standard drinks of alcohol per week. No history on file for drug.  Allergies:  Allergies  Allergen Reactions  . Pork-Derived Products Other (See Comments)    "I have pain in my stomach if I eat this"    Medications: I have reviewed the patient's current medications.  Results for orders placed or performed during the hospital encounter of 11/28/19 (from the past 48 hour(s))  Lipase, blood     Status: Abnormal   Collection Time: 11/28/19  1:24 PM  Result Value Ref Range   Lipase 66 (H) 11 - 51 U/L    Comment: Performed at [facility] [address], [place], [place] [address]  Comprehensive metabolic panel     Status: Abnormal   Collection Time: 11/28/19  1:24 PM  Result Value Ref Range   Sodium 133 (L) 135 - 145 mmol/L   Potassium 3.9 3.5 - 5.1 mmol/L   Chloride 101 98 - 111 mmol/L   CO2  21 (L) 22 - 32 mmol/L   Glucose, Bld 154 (H) 70 - 99 mg/dL    Comment: Glucose reference range applies only to samples taken after fasting for at least 8 hours.   BUN 9 6 - 20 mg/dL   Creatinine, Ser 0.98 0.61 - 1.24 mg/dL   Calcium 8.7 (L) 8.9 - 10.3 mg/dL   Total Protein 6.9 6.5 - 8.1 g/dL   Albumin 2.9 (L) 3.5 - 5.0 g/dL   AST 99 (H) 15 - 41 U/L   ALT 64 (H) 0 - 44 U/L   Alkaline Phosphatase 241 (H) 38 - 126 U/L   Total Bilirubin 7.9 (H) 0.3 - 1.2 mg/dL   GFR calc non Af Amer >60 >60 mL/min   GFR calc Af Amer >60 >60 mL/min   Anion gap 11 5 - 15    Comment: Performed at [facility] [address], [place] [address]  CBC     Status: Abnormal   Collection Time: 11/28/19  1:24 PM  Result Value Ref Range   WBC 17.7 (H) 4.0 - 10.5 K/uL   RBC 4.89 4.22 - 5.81 MIL/uL   Hemoglobin 16.4 13.0 - 17.0 g/dL   HCT 49.9 39.0 - 52.0 %   MCV 102.0 (H) 80.0 - 100.0 fL   MCH 33.5 26.0 - 34.0 pg   MCHC  32.9 30.0 - 36.0 g/dL   RDW 13.6 11.5 - 15.5 %   Platelets 349 150 - 400 K/uL   nRBC 0.0 0.0 - 0.2 %    Comment: Performed at [facility], [place] [address] [place], [place] [address]  Lactic acid, plasma     Status: Abnormal   Collection Time: 11/28/19  1:24 PM  Result Value Ref Range   Lactic Acid, Venous 2.4 (HH) 0.5 - 1.9 mmol/L    Comment: CRITICAL RESULT CALLED TO, READ BACK BY AND VERIFIED WITH: Felicita Gage RN AT 1409 11/28/19 BY WOOLLENK Performed at [facility], [address], [place], [place] [address]   Blood Culture (routine x 2)     Status: None (Preliminary result)   Collection Time: 11/28/19  5:33 PM   Specimen: BLOOD RIGHT HAND  Result Value Ref Range   Specimen Description BLOOD RIGHT HAND    Special Requests      BOTTLES DRAWN AEROBIC AND ANAEROBIC Blood Culture adequate volume   Culture      NO GROWTH < 24 HOURS Performed at [facility], [place] [address] [place], [place] [address]    Report Status PENDING   Lactic acid, plasma      Status: None   Collection Time: 11/28/19  5:35 PM  Result Value Ref Range   Lactic Acid, Venous 1.9 0.5 - 1.9 mmol/L    Comment: Performed at [facility] [address] [place], [place] [address]  APTT     Status: None   Collection Time: 11/28/19  5:35 PM  Result Value Ref Range   aPTT 33 24 - 36 seconds    Comment: Performed at [facility] [address], [place], [place] [address]  Protime-INR     Status: None   Collection Time: 11/28/19  5:35 PM  Result Value Ref Range   Prothrombin Time 15.1 11.4 - 15.2 seconds   INR 1.2 0.8 - 1.2    Comment: (NOTE) INR goal varies based on device and disease states. Performed at [facility], [place] [address] [place], [place] [address]   Respiratory Panel by RT PCR (Flu A&B, Covid) - Nasopharyngeal Swab     Status: None   Collection Time: 11/28/19  6:32 PM   Specimen: Nasopharyngeal Swab  Result Value Ref Range   SARS Coronavirus 2 by RT PCR NEGATIVE NEGATIVE    Comment: (NOTE) SARS-CoV-2 target nucleic acids are NOT DETECTED. The SARS-CoV-2 RNA is generally detectable in upper respiratoy specimens during the acute phase of infection. The lowest concentration of SARS-CoV-2 viral copies this assay can detect is 131 copies/mL. A negative result does not preclude SARS-Cov-2 infection and should not be used as the sole basis for treatment or other patient management decisions. A negative result may occur with  improper specimen collection/handling, submission of specimen other than nasopharyngeal swab, presence of viral mutation(s) within the areas targeted by this assay, and inadequate number of viral copies (<131 copies/mL). A negative result must be combined with clinical observations, patient history, and epidemiological information. The expected result is Negative. Fact Sheet for Patients:  PinkCheek.be Fact Sheet for Healthcare Providers:   GravelBags.it This test is not yet ap proved or cleared by the [place] FDA and  has been authorized for detection and/or diagnosis of SARS-CoV-2 by FDA under an Emergency Use Authorization (EUA). This EUA will remain  in effect (meaning this test can be used) for the duration  of the COVID-19 declaration under Section 564(b)(1) of the Act, 21 U.S.C. section 360bbb-3(b)(1), unless the authorization is terminated or revoked sooner.    Influenza A by PCR NEGATIVE NEGATIVE   Influenza B by PCR NEGATIVE NEGATIVE    Comment: (NOTE) The Xpert Xpress SARS-CoV-2/FLU/RSV assay is intended as an aid in  the diagnosis of influenza from Nasopharyngeal swab specimens and  should not be used as a sole basis for treatment. Nasal washings and  aspirates are unacceptable for Xpert Xpress SARS-CoV-2/FLU/RSV  testing. Fact Sheet for Patients: PinkCheek.be Fact Sheet for Healthcare Providers: GravelBags.it This test is not yet approved or cleared by the [place] FDA and  has been authorized for detection and/or diagnosis of SARS-CoV-2 by  FDA under an Emergency Use Authorization (EUA). This EUA will remain  in effect (meaning this test can be used) for the duration of the  Covid-19 declaration under Section 564(b)(1) of the Act, 21  U.S.C. section 360bbb-3(b)(1), unless the authorization is  terminated or revoked. Performed at [facility], [place] [address], [place], [place] [address]   Urinalysis, Routine w reflex microscopic     Status: None   Collection Time: 11/28/19  9:50 PM  Result Value Ref Range   Color, Urine YELLOW YELLOW   APPearance CLEAR CLEAR   Specific Gravity, Urine 1.017 1.005 - 1.030   pH 7.0 5.0 - 8.0   Glucose, UA NEGATIVE NEGATIVE mg/dL   Hgb urine dipstick NEGATIVE NEGATIVE   Bilirubin Urine NEGATIVE NEGATIVE   Ketones, ur NEGATIVE NEGATIVE mg/dL   Protein, ur NEGATIVE  NEGATIVE mg/dL   Nitrite NEGATIVE NEGATIVE   Leukocytes,Ua NEGATIVE NEGATIVE    Comment: Performed at [facility] [address], [place], [place] [address]  Comprehensive metabolic panel     Status: Abnormal   Collection Time: 11/29/19  1:48 AM  Result Value Ref Range   Sodium 137 135 - 145 mmol/L   Potassium 3.9 3.5 - 5.1 mmol/L   Chloride 102 98 - 111 mmol/L   CO2 26 22 - 32 mmol/L   Glucose, Bld 118 (H) 70 - 99 mg/dL    Comment: Glucose reference range applies only to samples taken after fasting for at least 8 hours.   BUN 8 6 - 20 mg/dL   Creatinine, Ser 0.84 0.61 - 1.24 mg/dL   Calcium 8.3 (L) 8.9 - 10.3 mg/dL   Total Protein 5.4 (L) 6.5 - 8.1 g/dL   Albumin 2.4 (L) 3.5 - 5.0 g/dL   AST 72 (H) 15 - 41 U/L   ALT 51 (H) 0 - 44 U/L   Alkaline Phosphatase 189 (H) 38 - 126 U/L   Total Bilirubin 7.0 (H) 0.3 - 1.2 mg/dL   GFR calc non Af Amer >60 >60 mL/min   GFR calc Af Amer >60 >60 mL/min   Anion gap 9 5 - 15    Comment: Performed at [facility] [address], [place], [place] [address]  CBC     Status: Abnormal   Collection Time: 11/29/19  1:48 AM  Result Value Ref Range   WBC 15.4 (H) 4.0 - 10.5 K/uL   RBC 4.25 4.22 - 5.81 MIL/uL   Hemoglobin 13.9 13.0 - 17.0 g/dL   HCT 42.8 39.0 - 52.0 %   MCV 100.7 (H) 80.0 - 100.0 fL   MCH 32.7 26.0 - 34.0 pg   MCHC 32.5 30.0 - 36.0 g/dL   RDW 13.7 11.5 - 15.5 %  Platelets 291 150 - 400 K/uL   nRBC 0.0 0.0 - 0.2 %    Comment: Performed at [facility], [place] [address] [place], [place] [address]    CT ABDOMEN PELVIS W CONTRAST  Result Date: 11/28/2019 CLINICAL DATA:  59 year old male with abdominal and pelvic pain, recent cholecystectomy on 11/22/2019. Drainage from abdominal wound. EXAM: CT ABDOMEN AND PELVIS WITH CONTRAST TECHNIQUE: Multidetector CT imaging of the abdomen and pelvis was performed using the standard protocol following bolus administration of intravenous contrast. CONTRAST:  136mL OMNIPAQUE  IOHEXOL 300 MG/ML  SOLN COMPARISON:  11/21/2019 CT and prior studies FINDINGS: Lower chest: No acute abnormality Hepatobiliary: Probable hepatic steatosis noted. Equivocal nodularity of the hepatic contour is present. No focal hepatic lesions are present. Cholecystectomy identified. Stranding in the cholecystectomy bed noted without focal collection. Equivocal enhancement of the CBD and cystic duct remnant walls noted. No biliary dilatation. Pancreas: Unremarkable Spleen: Unremarkable Adrenals/Urinary Tract: The kidneys, adrenal glands and bladder are unremarkable. Stomach/Bowel: Stomach is within normal limits. Appendix appears normal. No evidence of bowel wall thickening, distention, or inflammatory changes. Vascular/Lymphatic: No significant vascular findings are present. No enlarged abdominal or pelvic lymph nodes. Reproductive: Mild prostate enlargement again noted. Other: Stranding in the periumbilical subcutaneous tissues noted without focal abscess or gas. A small umbilical hernia containing fat is noted. No ascites. Musculoskeletal: No acute or suspicious bony abnormalities identified. IMPRESSION: 1. Stranding in the periumbilical subcutaneous tissues and cholecystectomy bed without focal abscess or gas. 2. Mild enhancement of the CBD and cystic duct remnant walls near the cholecystectomy site which may be postoperative inflammation but cholangitis is not excluded. No biliary dilatation. 3. Probable hepatic steatosis and equivocal nodularity of the hepatic contour. Correlate with any signs of cirrhosis. 4. Small umbilical hernia containing fat. Electronically Signed   By: Margarette Canada M.D.   On: 11/28/2019 18:48   DG Chest Port 1 View  Result Date: 11/28/2019 CLINICAL DATA:  Sepsis. EXAM: PORTABLE CHEST 1 VIEW COMPARISON:  11/21/2019 and prior radiographs FINDINGS: UPPER limits normal heart size and elevated RIGHT hemidiaphragm again noted. There is no evidence of focal airspace disease, pulmonary edema,  suspicious pulmonary nodule/mass, pleural effusion, or pneumothorax. No acute bony abnormalities are identified. IMPRESSION: No evidence of acute cardiopulmonary disease. Electronically Signed   By: Margarette Canada M.D.   On: 11/28/2019 17:24    Review of Systems negative except above Blood pressure 110/75, pulse 65, temperature 98.1 F (36.7 C), temperature source Oral, resp. rate 16, height 5\' 9"  (1.753 m), weight 96.4 kg, SpO2 94 %. Physical Exam vital signs stable afebrile exam please see preassessment evaluation upper quadrants little tender nontender in the lower quadrants occasional bowel sounds no rebound tenderness labs and CT reviewed Assessment/Plan: Probable bile leak and patient with jaundice Plan: Risk benefits methods and success rate of ERCP with probable stenting was discussed via the translator as above and he is in agreement to proceed later this morning with further work-up and plans pending those findings  Enders E 11/29/2019, 9:46 AM

## 2019-11-29 NOTE — Anesthesia Procedure Notes (Signed)
Procedure Name: Intubation Date/Time: 11/29/2019 11:43 AM Performed by: Oletta Lamas, CRNA Pre-anesthesia Checklist: Patient identified, Emergency Drugs available, Suction available and Patient being monitored Patient Re-evaluated:Patient Re-evaluated prior to induction Oxygen Delivery Method: Circle system utilized Preoxygenation: Pre-oxygenation with 100% oxygen Induction Type: IV induction Ventilation: Mask ventilation without difficulty Laryngoscope Size: Miller and 2 Grade View: Grade I Tube type: Oral Tube size: 7.5 mm Number of attempts: 1 Airway Equipment and Method: Stylet Placement Confirmation: ETT inserted through vocal cords under direct vision,  positive ETCO2 and breath sounds checked- equal and bilateral Secured at: 22 cm Tube secured with: Tape Dental Injury: Teeth and Oropharynx as per pre-operative assessment

## 2019-11-29 NOTE — Progress Notes (Signed)
Patient skin appears jaundice especially upper body, including bilateral UE. RN will continue to monitor.

## 2019-11-29 NOTE — Transfer of Care (Signed)
Immediate Anesthesia Transfer of Care Note  Patient: Micheal Watson  Procedure(s) Performed: ENDOSCOPIC RETROGRADE CHOLANGIOPANCREATOGRAPHY (ERCP) (N/A ) SPHINCTEROTOMY BILIARY DILATION BILIARY BRUSHING BILIARY STENT PLACEMENT  Patient Location: Endoscopy Unit  Anesthesia Type:General  Level of Consciousness: awake, alert  and patient cooperative  Airway & Oxygen Therapy: Patient Spontanous Breathing and Patient connected to face mask oxygen  Post-op Assessment: Report given to RN, Post -op Vital signs reviewed and stable and Patient moving all extremities  Post vital signs: Reviewed and stable  Last Vitals:  Vitals Value Taken Time  BP    Temp    Pulse 77 11/29/19 1247  Resp 19 11/29/19 1247  SpO2 99 % 11/29/19 1247  Vitals shown include unvalidated device data.  Last Pain:  Vitals:   11/29/19 1247  TempSrc: (P) Temporal  PainSc:          Complications: No apparent anesthesia complications

## 2019-11-29 NOTE — Anesthesia Postprocedure Evaluation (Signed)
Anesthesia Post Note  Patient: Micheal Watson  Procedure(s) Performed: ENDOSCOPIC RETROGRADE CHOLANGIOPANCREATOGRAPHY (ERCP) (N/A ) SPHINCTEROTOMY BILIARY DILATION BILIARY BRUSHING BILIARY STENT PLACEMENT     Patient location during evaluation: PACU Anesthesia Type: General Level of consciousness: awake and alert and oriented Pain management: pain level controlled Vital Signs Assessment: post-procedure vital signs reviewed and stable Respiratory status: spontaneous breathing, nonlabored ventilation and respiratory function stable Cardiovascular status: blood pressure returned to baseline and stable Postop Assessment: no apparent nausea or vomiting Anesthetic complications: no    Last Vitals:  Vitals:   11/29/19 1305 11/29/19 1329  BP: 129/80 123/74  Pulse: 76 75  Resp: 16 15  Temp:  36.7 C  SpO2: 95% 93%    Last Pain:  Vitals:   11/29/19 1329  TempSrc: Oral  PainSc:                  Darryl Blumenstein A.

## 2019-11-29 NOTE — Anesthesia Preprocedure Evaluation (Addendum)
Anesthesia Evaluation  Patient identified by MRN, date of birth, ID band Patient awake    Reviewed: Allergy & Precautions, NPO status , Patient's Chart, lab work & pertinent test results  Airway Mallampati: III  TM Distance: >3 FB Neck ROM: Full    Dental  (+) Dental Advisory Given   Pulmonary former smoker,    breath sounds clear to auscultation       Cardiovascular hypertension,  Rhythm:Regular     Neuro/Psych    GI/Hepatic (+) Hepatitis -  Endo/Other  Morbid obesity  Renal/GU      Musculoskeletal   Abdominal   Peds  Hematology   Anesthesia Other Findings   Reproductive/Obstetrics                            Anesthesia Physical Anesthesia Plan  ASA: III  Anesthesia Plan: General   Post-op Pain Management:    Induction: Intravenous and Rapid sequence  PONV Risk Score and Plan: 2 and Ondansetron and Dexamethasone  Airway Management Planned: Oral ETT  Additional Equipment:   Intra-op Plan:   Post-operative Plan: Extubation in OR  Informed Consent: I have reviewed the patients History and Physical, chart, labs and discussed the procedure including the risks, benefits and alternatives for the proposed anesthesia with the patient or authorized representative who has indicated his/her understanding and acceptance.     Dental advisory given  Plan Discussed with: CRNA and Surgeon  Anesthesia Plan Comments:         Anesthesia Quick Evaluation

## 2019-11-29 NOTE — Op Note (Signed)
[facility] Patient Name: Micheal Watson Procedure Date : 11/29/2019 MRN: [id number] Attending MD: Clarene Essex , MD Date of Birth: [birth year] CSN: [id number] Age: 59 Admit Type: Inpatient Procedure:                ERCP Indications:              Suspected bile leak, Jaundice abnormal liver test                            abnormal CT scan Providers:                Clarene Essex, MD, Carmie End, RN, Elspeth Cho                            Tech., Technician, Gala Lewandowsky, CRNA Referring MD:              Medicines:                General Anesthesia Complications:            No immediate complications. Estimated Blood Loss:     Estimated blood loss: none. Procedure:                Pre-Anesthesia Assessment:                           - Prior to the procedure, a History and Physical                            was performed, and patient medications and                            allergies were reviewed. The patient's tolerance of                            previous anesthesia was also reviewed. The risks                            and benefits of the procedure and the sedation                            options and risks were discussed with the patient.                            All questions were answered, and informed consent                            was obtained. Prior Anticoagulants: The patient has                            taken no previous anticoagulant or antiplatelet                            agents. ASA Grade Assessment: II - A patient with  mild systemic disease. After reviewing the risks                            and benefits, the patient was deemed in                            satisfactory condition to undergo the procedure.                           After obtaining informed consent, the scope was                            passed under direct vision. Throughout the                            procedure, the patient's  blood pressure, pulse, and                            oxygen saturations were monitored continuously. The                            [id number]) Olympus Duodensocope was                            introduced through the mouth, and used to inject                            contrast into and used to cannulate the bile duct.                            The ERCP was accomplished without difficulty. The                            patient tolerated the procedure well. Findings:      The major papilla was normal. Deep selective was obtained and there was       no pancreatic duct injection or wire advancement throughout the       procedure and an obvious distal duct stricture was confirmed on initial       cholangiogram but no obvious leak or stones seen and we proceeded with a       biliary sphincterotomy was made with a Hydratome sphincterotome using       ERBE electrocautery. There was no post-sphincterotomy bleeding. We       proceeded until we had adequate biliary drainage and could get the fully       bowed sphincterotome easily in and out of the duct and the biliary tree       was swept with a 9-12 mm adjustable balloon starting at the bifurcation.       We withdrew the 12 mm balloon to the top of the stricture and then       decreased it to 9 mm and the balloon was withdrawn through the patent       sphincterotomy site and nothing was found. Prior to that on occlusion       cholangiogram again no obvious leak was seen and  then we proceeded with       brushings and cells for cytology were obtained by brushing in the lower       third of the main bile duct i.e from the stricture. And then using the       measuring device on the brush a 5 cm stent would not be long enough to       cover the stricture in the endoscopy unit did not have a 7 cm 10 French       stent so we proceeded with placing one 10 Fr by 9 cm plastic stent with       a single external flap and a single internal flap was  placed 8 cm into       the common bile duct. Bile flowed through the stent. The stent was in       good position. There was no obvious complication the wire and introducer       was removed and the scope was removed and the patient tolerated the       procedure well Impression:               - The major papilla appeared normal. No pancreatic                            duct injection or wire advancement was done                            throughout the procedure there was a obvious distal                            duct stricture approximately 3 cm long                           - A biliary sphincterotomy was performed.                           - The biliary tree was swept and nothing additional                            was found.                           - Cells for cytology obtained in the lower third of                            the main duct.                           - One plastic stent was placed into the common bile                            duct. Recommendation:           - Clear liquid diet today. May slowly advance                            tomorrow if no delayed complications and okay with  surgical team                           - Continue present medications. Have surgery                            evaluate his wound since anesthesia had questions                            about possible infection                           - Return to GI clinic in 1 month.                           - Telephone GI clinic for pathology results in 1                            week.                           - Telephone GI clinic if symptomatic PRN.                           - Check liver enzymes (AST, ALT, alkaline                            phosphatase, bilirubin) and hemogram with white                            blood cell count and platelets in the morning. Will                            need following back to normal as an outpatient Procedure Code(s):         --- Professional ---                           [phone], Endoscopic retrograde                            cholangiopancreatography (ERCP); with placement of                            endoscopic stent into biliary or pancreatic duct,                            including pre- and post-dilation and guide wire                            passage, when performed, including sphincterotomy,                            when performed, each stent Diagnosis Code(s):        --- Professional ---  R17, Unspecified jaundice CPT copyright 2019 [employer]. All rights reserved. The codes documented in this report are preliminary and upon coder review may  be revised to meet current compliance requirements. Clarene Essex, MD 11/29/2019 12:43:17 PM This report has been signed electronically. Number of Addenda: 0

## 2019-11-29 NOTE — Plan of Care (Signed)
  Problem: Pain Managment: Goal: General experience of comfort will improve Outcome: Progressing   Problem: Safety: Goal: Ability to remain free from injury will improve Outcome: Progressing   Problem: Skin Integrity: Goal: Risk for impaired skin integrity will decrease Outcome: Progressing   

## 2019-11-30 LAB — CBC
HCT: 43.9 % (ref 39.0–52.0)
Hemoglobin: 14.5 g/dL (ref 13.0–17.0)
MCH: 33 pg (ref 26.0–34.0)
MCHC: 33 g/dL (ref 30.0–36.0)
MCV: 99.8 fL (ref 80.0–100.0)
Platelets: 314 10*3/uL (ref 150–400)
RBC: 4.4 MIL/uL (ref 4.22–5.81)
RDW: 13.6 % (ref 11.5–15.5)
WBC: 15.3 10*3/uL — ABNORMAL HIGH (ref 4.0–10.5)
nRBC: 0 % (ref 0.0–0.2)

## 2019-11-30 LAB — COMPREHENSIVE METABOLIC PANEL
ALT: 45 U/L — ABNORMAL HIGH (ref 0–44)
AST: 51 U/L — ABNORMAL HIGH (ref 15–41)
Albumin: 2.4 g/dL — ABNORMAL LOW (ref 3.5–5.0)
Alkaline Phosphatase: 174 U/L — ABNORMAL HIGH (ref 38–126)
Anion gap: 9 (ref 5–15)
BUN: 9 mg/dL (ref 6–20)
CO2: 24 mmol/L (ref 22–32)
Calcium: 8.2 mg/dL — ABNORMAL LOW (ref 8.9–10.3)
Chloride: 106 mmol/L (ref 98–111)
Creatinine, Ser: 0.82 mg/dL (ref 0.61–1.24)
GFR calc Af Amer: >60 mL/min (ref >60)
GFR calc non Af Amer: >60 mL/min (ref >60)
Glucose, Bld: 131 mg/dL — ABNORMAL HIGH (ref 70–99)
Potassium: 4 mmol/L (ref 3.5–5.1)
Sodium: 139 mmol/L (ref 135–145)
Total Bilirubin: 3.4 mg/dL — ABNORMAL HIGH (ref 0.3–1.2)
Total Protein: 5.8 g/dL — ABNORMAL LOW (ref 6.5–8.1)

## 2019-11-30 LAB — URINE CULTURE

## 2019-11-30 LAB — CYTOLOGY - NON PAP

## 2019-11-30 MED ORDER — HEPARIN SODIUM (PORCINE) 5000 UNIT/ML IJ SOLN
5000.0000 [IU] | Freq: Three times a day (TID) | INTRAMUSCULAR | Status: DC
  Administered 2019-11-30 – 2019-12-07 (×22): 5000 [IU] via SUBCUTANEOUS
  Filled 2019-11-30 (×21): qty 1

## 2019-11-30 MED ORDER — PANTOPRAZOLE SODIUM 40 MG PO TBEC
40.0000 mg | DELAYED_RELEASE_TABLET | Freq: Every day | ORAL | Status: DC
  Administered 2019-11-30 – 2019-12-09 (×9): 40 mg via ORAL
  Filled 2019-11-30 (×8): qty 1

## 2019-11-30 NOTE — Plan of Care (Signed)

## 2019-11-30 NOTE — Plan of Care (Signed)
  Problem: Health Behavior/Discharge Planning: Goal: Ability to manage health-related needs will improve Outcome: Progressing   Problem: Clinical Measurements: Goal: Ability to maintain clinical measurements within normal limits will improve Outcome: Progressing   Problem: Activity: Goal: Risk for activity intolerance will decrease Outcome: Progressing   Problem: Nutrition: Goal: Adequate nutrition will be maintained Outcome: Progressing   Problem: Coping: Goal: Level of anxiety will decrease Outcome: Progressing   Problem: Elimination: Goal: Will not experience complications related to urinary retention Outcome: Progressing   Problem: Pain Managment: Goal: General experience of comfort will improve Outcome: Progressing   Problem: Safety: Goal: Ability to remain free from injury will improve Outcome: Progressing   Problem: Skin Integrity: Goal: Risk for impaired skin integrity will decrease Outcome: Progressing

## 2019-11-30 NOTE — Plan of Care (Signed)
  Problem: Pain Managment: Goal: General experience of comfort will improve Outcome: Progressing   Problem: Safety: Goal: Ability to remain free from injury will improve Outcome: Progressing   Problem: Skin Integrity: Goal: Risk for impaired skin integrity will decrease Outcome: Progressing   

## 2019-11-30 NOTE — Progress Notes (Signed)
Noted moderate yellow drainage on umbilical incision site, dressing soaked, new dressing placed, Pt complains of 2/10 pain which worsens with deep breathing, will continue to monitor.

## 2019-11-30 NOTE — Progress Notes (Signed)
[facility] Progress Note  Micheal Watson 59 y.o. 07-27-1961  CC: Jaundice   Subjective: Patient seen and examined at bedside.  Denies nausea vomiting.  Continues to have mild epigastric discomfort which is present since admission.  Denies diarrhea or constipation.  [employer] interpreter service was used  ROS : Afebrile.  Negative for chest pain   Objective: Vital signs in last 24 hours: Vitals:   11/30/19 0449 11/30/19 0754  BP: (!) 114/58 125/76  Pulse: 69 66  Resp: 16 17  Temp: 97.8 F (36.6 C) 98.2 F (36.8 C)  SpO2: 92% 96%    Physical Exam:  General:  Alert, cooperative, no distress, appears stated age  Head:  Normocephalic, without obvious abnormality, atraumatic  Eyes:  , EOM's intact,   Lungs:    No respiratory distress  Heart:  Regular rate and rhythm, S1, S2 normal  Abdomen:   Soft, non-tender, nondistended, no peritoneal signs  Psych  mood and affect normal       Lab Results: Recent Labs    11/29/19 0148 11/30/19 0434  NA 137 139  K 3.9 4.0  CL 102 106  CO2 26 24  GLUCOSE 118* 131*  BUN 8 9  CREATININE 0.84 0.82  CALCIUM 8.3* 8.2*   Recent Labs    11/29/19 0148 11/30/19 0434  AST 72* 51*  ALT 51* 45*  ALKPHOS 189* 174*  BILITOT 7.0* 3.4*  PROT 5.4* 5.8*  ALBUMIN 2.4* 2.4*   Recent Labs    11/29/19 0148 11/30/19 0434  WBC 15.4* 15.3*  HGB 13.9 14.5  HCT 42.8 43.9  MCV 100.7* 99.8  PLT 291 314   Recent Labs    11/28/19 1735  LABPROT 15.1  INR 1.2      Assessment/Plan: Jaundice.  ERCP yesterday showed no leak but distal CBD stricture.  Sphincterotomy was performed and 10 [place] by 9 cm plastic stent was placed.  LFTs improving now.  Brushing was performed. -Abnormal LFTs.  Improving -Leukocytosis.  Stable  Recommendations ------------------------ -Advance diet to soft -Change Protonix to p.o. 40 mg once a day -Recommend follow-up with Dr. Watt Climes in 4 to 6 weeks after discharge for biliary stent  removal. -GI will sign off.  Call us back if needed  Otis Brace MD, [place] 11/30/2019, 8:13 AM  Contact #  [phone]

## 2019-11-30 NOTE — Progress Notes (Addendum)
1 Day Post-Op  Subjective: CC: Doing well. Some epigastric abdominal pain that is worse with deep breathing. No CP. Some pain with breathing but no increased work of breathing. Denies n/v. Tolerated liquids this am and diet was advanced to soft by GI. Has not had solid food yet.   Objective: Vital signs in last 24 hours: Temp:  [97.8 F (36.6 C)-98.4 F (36.9 C)] 98.2 F (36.8 C) (04/27 0754) Pulse Rate:  [66-79] 66 (04/27 0754) Resp:  [15-20] 17 (04/27 0754) BP: (110-148)/(58-86) 125/76 (04/27 0754) SpO2:  [92 %-99 %] 96 % (04/27 0754) Last BM Date: 11/28/19  Intake/Output from previous day: 04/26 0701 - 04/27 0700 In: 250 [P.O.:250] Out: 1480 [Urine:1475; Blood:5] Intake/Output this shift: Total I/O In: -  Out: 280 [Urine:280]  PE: Gen:  Alert, NAD, pleasant Card:  RRR, no M/G/R heard. No tachycardia Pulm:  CTAB, no W/R/R, effort normal. On room air  Abd: Soft, protuberant, tenderness of the epigastrium, +BS.  Umbilical laparoscopic incision draining transparent yellow drainage as noted below. Incision with scant erythema surrounding but no induration, heat or fluctuance. No obvious opening for where the drainage is coming from. Remaining laparoscopic incisions are c/d/i and without drainage or signs of infection.  Ext:  No erythema, edema, or tenderness BUE/BLE  Psych: A&Ox3  Skin: no rashes noted, warm and dry  Umbilical incision opened at skin level. No drainage or purulence.    Lab Results:  Recent Labs    11/29/19 0148 11/30/19 0434  WBC 15.4* 15.3*  HGB 13.9 14.5  HCT 42.8 43.9  PLT 291 314   BMET Recent Labs    11/29/19 0148 11/30/19 0434  NA 137 139  K 3.9 4.0  CL 102 106  CO2 26 24  GLUCOSE 118* 131*  BUN 8 9  CREATININE 0.84 0.82  CALCIUM 8.3* 8.2*   PT/INR Recent Labs    11/28/19 1735  LABPROT 15.1  INR 1.2   CMP     Component Value Date/Time   NA 139 11/30/2019 0434   K 4.0 11/30/2019 0434   CL 106 11/30/2019 0434   CO2 24  11/30/2019 0434   GLUCOSE 131 (H) 11/30/2019 0434   BUN 9 11/30/2019 0434   CREATININE 0.82 11/30/2019 0434   CALCIUM 8.2 (L) 11/30/2019 0434   PROT 5.8 (L) 11/30/2019 0434   ALBUMIN 2.4 (L) 11/30/2019 0434   AST 51 (H) 11/30/2019 0434   ALT 45 (H) 11/30/2019 0434   ALKPHOS 174 (H) 11/30/2019 0434   BILITOT 3.4 (H) 11/30/2019 0434   GFRNONAA >60 11/30/2019 0434   GFRAA >60 11/30/2019 0434   Lipase     Component Value Date/Time   LIPASE 66 (H) 11/28/2019 1324       Studies/Results: CT ABDOMEN PELVIS W CONTRAST  Result Date: 11/28/2019 CLINICAL DATA:  59 year old male with abdominal and pelvic pain, recent cholecystectomy on 11/22/2019. Drainage from abdominal wound. EXAM: CT ABDOMEN AND PELVIS WITH CONTRAST TECHNIQUE: Multidetector CT imaging of the abdomen and pelvis was performed using the standard protocol following bolus administration of intravenous contrast. CONTRAST:  187mL OMNIPAQUE IOHEXOL 300 MG/ML  SOLN COMPARISON:  11/21/2019 CT and prior studies FINDINGS: Lower chest: No acute abnormality Hepatobiliary: Probable hepatic steatosis noted. Equivocal nodularity of the hepatic contour is present. No focal hepatic lesions are present. Cholecystectomy identified. Stranding in the cholecystectomy bed noted without focal collection. Equivocal enhancement of the CBD and cystic duct remnant walls noted. No biliary dilatation. Pancreas: Unremarkable Spleen: Unremarkable Adrenals/Urinary  Tract: The kidneys, adrenal glands and bladder are unremarkable. Stomach/Bowel: Stomach is within normal limits. Appendix appears normal. No evidence of bowel wall thickening, distention, or inflammatory changes. Vascular/Lymphatic: No significant vascular findings are present. No enlarged abdominal or pelvic lymph nodes. Reproductive: Mild prostate enlargement again noted. Other: Stranding in the periumbilical subcutaneous tissues noted without focal abscess or gas. A small umbilical hernia containing fat  is noted. No ascites. Musculoskeletal: No acute or suspicious bony abnormalities identified. IMPRESSION: 1. Stranding in the periumbilical subcutaneous tissues and cholecystectomy bed without focal abscess or gas. 2. Mild enhancement of the CBD and cystic duct remnant walls near the cholecystectomy site which may be postoperative inflammation but cholangitis is not excluded. No biliary dilatation. 3. Probable hepatic steatosis and equivocal nodularity of the hepatic contour. Correlate with any signs of cirrhosis. 4. Small umbilical hernia containing fat. Electronically Signed   By: Margarette Canada M.D.   On: 11/28/2019 18:48   DG Chest Port 1 View  Result Date: 11/28/2019 CLINICAL DATA:  Sepsis. EXAM: PORTABLE CHEST 1 VIEW COMPARISON:  11/21/2019 and prior radiographs FINDINGS: UPPER limits normal heart size and elevated RIGHT hemidiaphragm again noted. There is no evidence of focal airspace disease, pulmonary edema, suspicious pulmonary nodule/mass, pleural effusion, or pneumothorax. No acute bony abnormalities are identified. IMPRESSION: No evidence of acute cardiopulmonary disease. Electronically Signed   By: Margarette Canada M.D.   On: 11/28/2019 17:24   DG ERCP BILIARY & PANCREATIC DUCTS  Result Date: 11/29/2019 CLINICAL DATA:  Elevated bilirubin after cholecystectomy on 11/22/2019 EXAM: ERCP TECHNIQUE: Multiple spot images obtained with the fluoroscopic device and submitted for interpretation post-procedure. COMPARISON:  CT of the abdomen and pelvis on 11/28/2019 FINDINGS: Imaging with a C-arm during the ERCP procedure demonstrates cannulation of the common bile duct with cholangiogram demonstrating dilatation of the upper common bile duct and proper hepatic duct. No evidence of contrast extravasation. There appears to be probable stricture of the mid to distal common bile duct. An endoscopic biliary stent was placed spanning the entire extent of the common bile duct and extending into the proper hepatic duct.  IMPRESSION: ERCP demonstrating probable stricture of the mid to distal common bile duct. This was treated with placement of an endoscopic biliary stent across the level of stricture. These images were submitted for radiologic interpretation only. Please see the procedural report for the amount of contrast and the fluoroscopy time utilized. Electronically Signed   By: Aletta Edouard M.D.   On: 11/29/2019 12:45    Anti-infectives: Anti-infectives (From admission, onward)   Start     Dose/Rate Route Frequency Ordered Stop   11/28/19 1745  ceFEPIme (MAXIPIME) 2 g in sodium chloride 0.9 % 100 mL IVPB     2 g 200 mL/hr over 30 Minutes Intravenous Every 8 hours 11/28/19 1705     11/28/19 1715  metroNIDAZOLE (FLAGYL) IVPB 500 mg     500 mg 100 mL/hr over 60 Minutes Intravenous  Once 11/28/19 1705 11/28/19 2130       Assessment/Plan Hx of Acute Cholecystitis with hydrops - s/p Lap Chole, 4/19, Dr. Grandville Silos CBD stricture - s/p ERCP with stenting [id number] - Umbilical incision opened this am - Cont abx  - Monitor wound overnight   FEN - Soft VTE - SCDs, subq heparin ID - Flagyl x 1, Cefepime 4/25 >> Follow-Up: Dr. Grandville Silos and GI   LOS: 2 days    Jillyn Ledger , [facility] 11/30/2019, 9:32 AM Please see Amion for pager  number during day hours 7:00am-4:30pm

## 2019-12-01 ENCOUNTER — Inpatient Hospital Stay (HOSPITAL_COMMUNITY): Payer: Self-pay

## 2019-12-01 LAB — COMPREHENSIVE METABOLIC PANEL
ALT: 51 U/L — ABNORMAL HIGH (ref 0–44)
AST: 95 U/L — ABNORMAL HIGH (ref 15–41)
Albumin: 2.2 g/dL — ABNORMAL LOW (ref 3.5–5.0)
Alkaline Phosphatase: 120 U/L (ref 38–126)
Anion gap: 10 (ref 5–15)
BUN: 8 mg/dL (ref 6–20)
CO2: 22 mmol/L (ref 22–32)
Calcium: 7.8 mg/dL — ABNORMAL LOW (ref 8.9–10.3)
Chloride: 104 mmol/L (ref 98–111)
Creatinine, Ser: 0.92 mg/dL (ref 0.61–1.24)
GFR calc Af Amer: >60 mL/min (ref >60)
GFR calc non Af Amer: >60 mL/min (ref >60)
Glucose, Bld: 138 mg/dL — ABNORMAL HIGH (ref 70–99)
Potassium: 3.7 mmol/L (ref 3.5–5.1)
Sodium: 136 mmol/L (ref 135–145)
Total Bilirubin: 2.3 mg/dL — ABNORMAL HIGH (ref 0.3–1.2)
Total Protein: 5.3 g/dL — ABNORMAL LOW (ref 6.5–8.1)

## 2019-12-01 LAB — CBC
HCT: 45.8 % (ref 39.0–52.0)
Hemoglobin: 15 g/dL (ref 13.0–17.0)
MCH: 33.4 pg (ref 26.0–34.0)
MCHC: 32.8 g/dL (ref 30.0–36.0)
MCV: 102 fL — ABNORMAL HIGH (ref 80.0–100.0)
Platelets: 343 10*3/uL (ref 150–400)
RBC: 4.49 MIL/uL (ref 4.22–5.81)
RDW: 13.9 % (ref 11.5–15.5)
WBC: 17.7 10*3/uL — ABNORMAL HIGH (ref 4.0–10.5)
nRBC: 0 % (ref 0.0–0.2)

## 2019-12-01 MED ORDER — IOHEXOL 300 MG/ML  SOLN
100.0000 mL | Freq: Once | INTRAMUSCULAR | Status: AC | PRN
  Administered 2019-12-01: 100 mL via INTRAVENOUS

## 2019-12-01 NOTE — Progress Notes (Signed)
2 Days Post-Op   Subjective/Chief Complaint: Patient reports only mild abdominal pain Still leaking a lot of fluid at the umbilicus   Objective: Vital signs in last 24 hours: Temp:  [98.2 F (36.8 C)-98.6 F (37 C)] 98.5 F (36.9 C) (04/28 0824) Pulse Rate:  [74-84] 79 (04/28 0824) Resp:  [16-18] 17 (04/28 0824) BP: (110-141)/(66-88) 141/88 (04/28 0824) SpO2:  [92 %-97 %] 96 % (04/28 0824) Last BM Date: 11/30/19  Intake/Output from previous day: 04/27 0701 - 04/28 0700 In: 4380.1 [P.O.:1460; I.V.:2515.3; IV Piggyback:404.8] Out: 2606 [Urine:2605; Stool:1] Intake/Output this shift: No intake/output data recorded.  Exam: Awake and alert Appears comfortable Abdomen obese, questionably distended, minimally tender with bile tinged fluid draining at the umbilicus  Lab Results:  Recent Labs    11/29/19 0148 11/30/19 0434  WBC 15.4* 15.3*  HGB 13.9 14.5  HCT 42.8 43.9  PLT 291 314   BMET Recent Labs    11/29/19 0148 11/30/19 0434  NA 137 139  K 3.9 4.0  CL 102 106  CO2 26 24  GLUCOSE 118* 131*  BUN 8 9  CREATININE 0.84 0.82  CALCIUM 8.3* 8.2*   PT/INR Recent Labs    11/28/19 1735  LABPROT 15.1  INR 1.2   ABG No results for input(s): PHART, HCO3 in the last 72 hours.  Invalid input(s): PCO2, PO2  Studies/Results: DG ERCP BILIARY & PANCREATIC DUCTS  Result Date: 11/29/2019 CLINICAL DATA:  Elevated bilirubin after cholecystectomy on 11/22/2019 EXAM: ERCP TECHNIQUE: Multiple spot images obtained with the fluoroscopic device and submitted for interpretation post-procedure. COMPARISON:  CT of the abdomen and pelvis on 11/28/2019 FINDINGS: Imaging with a C-arm during the ERCP procedure demonstrates cannulation of the common bile duct with cholangiogram demonstrating dilatation of the upper common bile duct and proper hepatic duct. No evidence of contrast extravasation. There appears to be probable stricture of the mid to distal common bile duct. An endoscopic  biliary stent was placed spanning the entire extent of the common bile duct and extending into the proper hepatic duct. IMPRESSION: ERCP demonstrating probable stricture of the mid to distal common bile duct. This was treated with placement of an endoscopic biliary stent across the level of stricture. These images were submitted for radiologic interpretation only. Please see the procedural report for the amount of contrast and the fluoroscopy time utilized. Electronically Signed   By: Aletta Edouard M.D.   On: 11/29/2019 12:45    Anti-infectives: Anti-infectives (From admission, onward)   Start     Dose/Rate Route Frequency Ordered Stop   11/28/19 1745  ceFEPIme (MAXIPIME) 2 g in sodium chloride 0.9 % 100 mL IVPB     2 g 200 mL/hr over 30 Minutes Intravenous Every 8 hours 11/28/19 1705     11/28/19 1715  metroNIDAZOLE (FLAGYL) IVPB 500 mg     500 mg 100 mL/hr over 60 Minutes Intravenous  Once 11/28/19 1705 11/28/19 2130      Assessment/Plan: s/p Procedure(s): ENDOSCOPIC RETROGRADE CHOLANGIOPANCREATOGRAPHY (ERCP) (N/A) SPHINCTEROTOMY BILIARY DILATION BILIARY BRUSHING BILIARY STENT PLACEMENT  Minimal fluid intra-abdominally seen on CT on 4/25 but patient's WBC continues to remain elevated and he is leaking what appears to be bile out of his incision above the umbilicus.  Will repeat a CT scan with oral contrast to again look for ascites, abscess, or fistula given his clinical appearance   LOS: 3 days    Coralie Keens MD 12/01/2019

## 2019-12-01 NOTE — Plan of Care (Signed)
  Problem: Education: Goal: Knowledge of General Education information will improve Description: Including pain rating scale, medication(s)/side effects and non-pharmacologic comfort measures Outcome: Progressing   Problem: Activity: Goal: Risk for activity intolerance will decrease Outcome: Progressing   Problem: Nutrition: Goal: Adequate nutrition will be maintained Outcome: Progressing   

## 2019-12-01 NOTE — Progress Notes (Addendum)
Pt to CT  1420: return from CT

## 2019-12-01 NOTE — Plan of Care (Signed)
RN changed pt's guaze dressing on abdomen, walked pt down hall then returned to room and dressing had been saturated with yellow drainage. Abdomen redressed.   Problem: Activity: Goal: Risk for activity intolerance will decrease Outcome: Progressing   Problem: Pain Managment: Goal: General experience of comfort will improve Outcome: Progressing

## 2019-12-02 ENCOUNTER — Inpatient Hospital Stay (HOSPITAL_COMMUNITY): Payer: Self-pay

## 2019-12-02 LAB — COMPREHENSIVE METABOLIC PANEL
ALT: 47 U/L — ABNORMAL HIGH (ref 0–44)
AST: 81 U/L — ABNORMAL HIGH (ref 15–41)
Albumin: 2.2 g/dL — ABNORMAL LOW (ref 3.5–5.0)
Alkaline Phosphatase: 115 U/L (ref 38–126)
Anion gap: 8 (ref 5–15)
BUN: 8 mg/dL (ref 6–20)
CO2: 22 mmol/L (ref 22–32)
Calcium: 7.7 mg/dL — ABNORMAL LOW (ref 8.9–10.3)
Chloride: 107 mmol/L (ref 98–111)
Creatinine, Ser: 0.89 mg/dL (ref 0.61–1.24)
GFR calc Af Amer: >60 mL/min (ref >60)
GFR calc non Af Amer: >60 mL/min (ref >60)
Glucose, Bld: 104 mg/dL — ABNORMAL HIGH (ref 70–99)
Potassium: 3.8 mmol/L (ref 3.5–5.1)
Sodium: 137 mmol/L (ref 135–145)
Total Bilirubin: 2.3 mg/dL — ABNORMAL HIGH (ref 0.3–1.2)
Total Protein: 5.2 g/dL — ABNORMAL LOW (ref 6.5–8.1)

## 2019-12-02 LAB — CBC
HCT: 43.5 % (ref 39.0–52.0)
Hemoglobin: 14.2 g/dL (ref 13.0–17.0)
MCH: 33.2 pg (ref 26.0–34.0)
MCHC: 32.6 g/dL (ref 30.0–36.0)
MCV: 101.6 fL — ABNORMAL HIGH (ref 80.0–100.0)
Platelets: 298 10*3/uL (ref 150–400)
RBC: 4.28 MIL/uL (ref 4.22–5.81)
RDW: 13.8 % (ref 11.5–15.5)
WBC: 15.6 10*3/uL — ABNORMAL HIGH (ref 4.0–10.5)
nRBC: 0 % (ref 0.0–0.2)

## 2019-12-02 MED ORDER — POLYETHYLENE GLYCOL 3350 17 G PO PACK
17.0000 g | PACK | Freq: Every day | ORAL | Status: DC | PRN
  Administered 2019-12-05: 17 g via ORAL
  Filled 2019-12-02: qty 1

## 2019-12-02 MED ORDER — HYDROMORPHONE HCL 1 MG/ML IJ SOLN
1.0000 mg | INTRAMUSCULAR | Status: DC | PRN
  Administered 2019-12-06 – 2019-12-08 (×4): 1 mg via INTRAVENOUS
  Filled 2019-12-02 (×4): qty 1

## 2019-12-02 MED ORDER — ACETAMINOPHEN 325 MG PO TABS
650.0000 mg | ORAL_TABLET | Freq: Four times a day (QID) | ORAL | Status: DC | PRN
  Administered 2019-12-08 – 2019-12-09 (×3): 650 mg via ORAL
  Filled 2019-12-02 (×3): qty 2

## 2019-12-02 MED ORDER — TECHNETIUM TC 99M MEBROFENIN IV KIT
7.4000 | PACK | Freq: Once | INTRAVENOUS | Status: AC | PRN
  Administered 2019-12-02: 7.4 via INTRAVENOUS

## 2019-12-02 MED ORDER — OXYCODONE HCL 5 MG PO TABS
5.0000 mg | ORAL_TABLET | ORAL | Status: DC | PRN
  Administered 2019-12-04: 5 mg via ORAL
  Administered 2019-12-04: 10 mg via ORAL
  Administered 2019-12-04 – 2019-12-05 (×2): 5 mg via ORAL
  Administered 2019-12-06 – 2019-12-09 (×8): 10 mg via ORAL
  Filled 2019-12-02 (×2): qty 1
  Filled 2019-12-02 (×9): qty 2
  Filled 2019-12-02: qty 1
  Filled 2019-12-02: qty 2

## 2019-12-02 MED ORDER — DOCUSATE SODIUM 100 MG PO CAPS
100.0000 mg | ORAL_CAPSULE | Freq: Two times a day (BID) | ORAL | Status: DC
  Administered 2019-12-02 – 2019-12-09 (×13): 100 mg via ORAL
  Filled 2019-12-02 (×13): qty 1

## 2019-12-02 NOTE — Plan of Care (Signed)
  Problem: Education: Goal: Knowledge of General Education information will improve Description: Including pain rating scale, medication(s)/side effects and non-pharmacologic comfort measures Outcome: Progressing   Problem: Activity: Goal: Risk for activity intolerance will decrease Outcome: Progressing   Problem: Nutrition: Goal: Adequate nutrition will be maintained Outcome: Progressing   

## 2019-12-02 NOTE — Plan of Care (Signed)
  Problem: Health Behavior/Discharge Planning: Goal: Ability to manage health-related needs will improve Outcome: Progressing   Problem: Clinical Measurements: Goal: Diagnostic test results will improve Outcome: Progressing   

## 2019-12-02 NOTE — Progress Notes (Signed)
3 Days Post-Op  Subjective: CC: Abdominal pain and wound drainage Spanish interpreter used Patient reports that he is doing well.  He reports 5/10, constant pain at his umbilical incision.  He notes continued yellow drainage from the wound.  He reports that his abdomen is otherwise without any areas of pain but does feel full.  He is tolerating his soft diet without any increased abdominal pain, nausea, vomiting.  He is passing flatus and had a BM in the last 24 hours.  He is mobilizing without difficulty.  He is urinating without difficulty.  We discussed his CT scan results and the current plan of treatment.  Objective: Vital signs in last 24 hours: Temp:  [98.3 F (36.8 C)-99.2 F (37.3 C)] 98.3 F (36.8 C) (04/29 0725) Pulse Rate:  [68-73] 72 (04/29 0725) Resp:  [16-17] 17 (04/29 0725) BP: (123-134)/(81-93) 133/82 (04/29 0725) SpO2:  [94 %-96 %] 94 % (04/29 0725) Last BM Date: 11/30/19  Intake/Output from previous day: 04/28 0701 - 04/29 0700 In: 1240 [P.O.:1240] Out: 3300 [Urine:3300] Intake/Output this shift: No intake/output data recorded.  PE: Gen:  Alert, NAD, pleasant Card:  RRR, no M/G/R heard. No tachycardia Pulm:  CTAB, no W/R/R, effort normal. On room air  Abd: Distended and protuberant but soft, tenderness of the epigastrium and around umbilical incision. +BS.  Umbilical laparoscopic incision draining transparent yellow drainage as noted below. Incision with scant erythema surrounding but no induration, heat or fluctuance. No obvious opening for where the drainage is coming from. Remaining laparoscopic incisions are c/d/i and without drainage or signs of infection.  Ext:  No erythema, edema, or tenderness BUE/BLE  Psych: A&Ox3  Skin: no rashes noted, warm and dry  Lab Results:  Recent Labs    12/01/19 0920 12/02/19 0214  WBC 17.7* 15.6*  HGB 15.0 14.2  HCT 45.8 43.5  PLT 343 298   BMET Recent Labs    12/01/19 0920 12/02/19 0214  NA 136 137  K 3.7  3.8  CL 104 107  CO2 22 22  GLUCOSE 138* 104*  BUN 8 8  CREATININE 0.92 0.89  CALCIUM 7.8* 7.7*   PT/INR No results for input(s): LABPROT, INR in the last 72 hours. CMP     Component Value Date/Time   NA 137 12/02/2019 0214   K 3.8 12/02/2019 0214   CL 107 12/02/2019 0214   CO2 22 12/02/2019 0214   GLUCOSE 104 (H) 12/02/2019 0214   BUN 8 12/02/2019 0214   CREATININE 0.89 12/02/2019 0214   CALCIUM 7.7 (L) 12/02/2019 0214   PROT 5.2 (L) 12/02/2019 0214   ALBUMIN 2.2 (L) 12/02/2019 0214   AST 81 (H) 12/02/2019 0214   ALT 47 (H) 12/02/2019 0214   ALKPHOS 115 12/02/2019 0214   BILITOT 2.3 (H) 12/02/2019 0214   GFRNONAA >60 12/02/2019 0214   GFRAA >60 12/02/2019 0214   Lipase     Component Value Date/Time   LIPASE 66 (H) 11/28/2019 1324       Studies/Results: CT ABDOMEN PELVIS W CONTRAST  Result Date: 12/01/2019 CLINICAL DATA:  History of recent laparoscopic cholecystectomy with draining wound anteriorly EXAM: CT ABDOMEN AND PELVIS WITH CONTRAST TECHNIQUE: Multidetector CT imaging of the abdomen and pelvis was performed using the standard protocol following bolus administration of intravenous contrast. CONTRAST:  169mL OMNIPAQUE IOHEXOL 300 MG/ML  SOLN COMPARISON:  11/28/2019 FINDINGS: Lower chest: No focal infiltrate or sizable effusion is seen. Small granuloma is noted in the right lower lobe. Hepatobiliary: Liver  is diffusely fatty infiltrated. The gallbladder has been surgically removed. There is a mild amount of fluid within the gallbladder fossa although no true biloma is noted. There is however new increased free fluid within the abdomen surrounding the liver and spleen and extending into the pelvis likely related to bile leak despite interval placement of biliary stent. The stent remains in adequate position. Pancreas: Unremarkable. No pancreatic ductal dilatation or surrounding inflammatory changes. Spleen: Normal in size without focal abnormality. Adrenals/Urinary Tract:  Adrenal glands are unremarkable. Kidneys demonstrate a normal enhancement pattern bilaterally. No renal calculi are seen. Delayed images demonstrate normal excretion of contrast material. The bladder is well distended. Stomach/Bowel: The appendix is well visualized. No obstructive or inflammatory change of the colon or small bowel is seen. The stomach is within normal limits. Vascular/Lymphatic: No significant vascular findings are present. No enlarged abdominal or pelvic lymph nodes. Reproductive: Prostate is unremarkable. Other: Free fluid is noted within the abdomen and pelvis as described likely related to biliary leak. This is new from the prior exam of 11/28/2019. This may also have occurred in the interval between the original CT and subsequent ERCP. Musculoskeletal: No acute or significant osseous findings. Mild inflammatory changes are noted in the abdominal wall related to the recent surgery. These are stable in appearance from the prior exam. No large subcutaneous fluid collection is noted. IMPRESSION: Free fluid within the abdomen and pelvis as described likely related to a bile leak. Biliary stent remains in satisfactory position. Inflammatory changes related to prior laparoscopic cholecystectomy without definitive subcutaneous fluid collection. No other focal abnormality is noted. Electronically Signed   By: Inez Catalina M.D.   On: 12/01/2019 15:57    Anti-infectives: Anti-infectives (From admission, onward)   Start     Dose/Rate Route Frequency Ordered Stop   11/28/19 1745  ceFEPIme (MAXIPIME) 2 g in sodium chloride 0.9 % 100 mL IVPB     2 g 200 mL/hr over 30 Minutes Intravenous Every 8 hours 11/28/19 1705     11/28/19 1715  metroNIDAZOLE (FLAGYL) IVPB 500 mg     500 mg 100 mL/hr over 60 Minutes Intravenous  Once 11/28/19 1705 11/28/19 2130       Assessment/Plan Hx of Acute Cholecystitis with hydrops - s/p Lap Chole, 4/19, Dr. Grandville Silos CBD stricture - s/p ERCP with stenting  4/26 Suspect Bile Leak -ERCP on 4/26 showed no evidence of bile leak.  CT scan 4/28 shows large amount of intra-abdominal free fluid that is suspected be from bile leak.  Patient appears to be decompressing through umbilical incision with bilious like fluid.  His LFTs and T bili continue to improve.  WBC downtrending today from 17.7 to 15.6.  He is afebrile, and tolerating a diet. He does not appear sick/septic from this. Will continue to monitor him clinically as well as with lab work.  He may need a repeat HIDA scan during hospital stay to rule out ongoing bile leak if his condition worsens.  - Cont abx   FEN - Soft VTE - SCDs, subq heparin ID - Flagyl x 1, Cefepime 4/25 >> Follow-Up: Dr. Grandville Silos and GI   LOS: 4 days    Jillyn Ledger , [facility] 12/02/2019, 10:28 AM Please see Amion for pager number during day hours 7:00am-4:30pm

## 2019-12-03 ENCOUNTER — Inpatient Hospital Stay (HOSPITAL_COMMUNITY): Payer: Self-pay

## 2019-12-03 LAB — URINALYSIS, COMPLETE (UACMP) WITH MICROSCOPIC
Bacteria, UA: NONE SEEN
Bilirubin Urine: NEGATIVE
Glucose, UA: NEGATIVE mg/dL
Hgb urine dipstick: NEGATIVE
Ketones, ur: NEGATIVE mg/dL
Leukocytes,Ua: NEGATIVE
Nitrite: NEGATIVE
Protein, ur: NEGATIVE mg/dL
Specific Gravity, Urine: 1.015 (ref 1.005–1.030)
pH: 6 (ref 5.0–8.0)

## 2019-12-03 LAB — CBC
HCT: 43.5 % (ref 39.0–52.0)
Hemoglobin: 14.3 g/dL (ref 13.0–17.0)
MCH: 33.4 pg (ref 26.0–34.0)
MCHC: 32.9 g/dL (ref 30.0–36.0)
MCV: 101.6 fL — ABNORMAL HIGH (ref 80.0–100.0)
Platelets: 332 10*3/uL (ref 150–400)
RBC: 4.28 MIL/uL (ref 4.22–5.81)
RDW: 13.5 % (ref 11.5–15.5)
WBC: 17.5 10*3/uL — ABNORMAL HIGH (ref 4.0–10.5)
nRBC: 0 % (ref 0.0–0.2)

## 2019-12-03 LAB — COMPREHENSIVE METABOLIC PANEL
ALT: 39 U/L (ref 0–44)
AST: 58 U/L — ABNORMAL HIGH (ref 15–41)
Albumin: 2.2 g/dL — ABNORMAL LOW (ref 3.5–5.0)
Alkaline Phosphatase: 111 U/L (ref 38–126)
Anion gap: 7 (ref 5–15)
BUN: 6 mg/dL (ref 6–20)
CO2: 22 mmol/L (ref 22–32)
Calcium: 7.9 mg/dL — ABNORMAL LOW (ref 8.9–10.3)
Chloride: 108 mmol/L (ref 98–111)
Creatinine, Ser: 0.79 mg/dL (ref 0.61–1.24)
GFR calc Af Amer: >60 mL/min (ref >60)
GFR calc non Af Amer: >60 mL/min (ref >60)
Glucose, Bld: 111 mg/dL — ABNORMAL HIGH (ref 70–99)
Potassium: 3.6 mmol/L (ref 3.5–5.1)
Sodium: 137 mmol/L (ref 135–145)
Total Bilirubin: 1.8 mg/dL — ABNORMAL HIGH (ref 0.3–1.2)
Total Protein: 5.3 g/dL — ABNORMAL LOW (ref 6.5–8.1)

## 2019-12-03 LAB — CULTURE, BLOOD (ROUTINE X 2)
Culture: NO GROWTH
Special Requests: ADEQUATE

## 2019-12-03 MED ORDER — PIPERACILLIN-TAZOBACTAM 3.375 G IVPB 30 MIN: 3.3750 g | Freq: Three times a day (TID) | INTRAVENOUS | Status: DC

## 2019-12-03 MED ORDER — PIPERACILLIN-TAZOBACTAM 3.375 G IVPB
3.3750 g | Freq: Three times a day (TID) | INTRAVENOUS | Status: DC
  Administered 2019-12-03 – 2019-12-08 (×15): 3.375 g via INTRAVENOUS
  Filled 2019-12-03 (×15): qty 50

## 2019-12-03 NOTE — Progress Notes (Signed)
4 Days Post-Op  Subjective: CC: Interpreter used.  Doing well.  Reports no abdominal pain.  Does feel his abdomen is full and tight.  He is tolerating a diet without any nausea or vomiting.  He is passing flatus.  Last BM yesterday and normal.  He is mobilizing without difficulty.  He denies any dysuria but is having some increased urinary frequency.  I just discontinued his IV fluids.  He notes occasional shortness of breath.  Pulling 2250 on I-S.  No chest pain or chest pressure.   Objective: Vital signs in last 24 hours: Temp:  [98.1 F (36.7 C)-99 F (37.2 C)] 98.9 F (37.2 C) (04/30 0832) Pulse Rate:  [70-84] 75 (04/30 0832) Resp:  [16-18] 18 (04/30 0832) BP: (109-135)/(61-79) 135/77 (04/30 0832) SpO2:  [93 %-97 %] 96 % (04/30 0832) Last BM Date: 12/02/19  Intake/Output from previous day: 04/29 0701 - 04/30 0700 In: 960 [P.O.:960] Out: 1400 [Urine:1400] Intake/Output this shift: Total I/O In: 120 [P.O.:120] Out: -   PE: Gen: Alert, NAD, pleasant Card: RRR, no M/G/R heard. No tachycardia Pulm: CTAB, no W/R/R, effort normal. On room air. Pulling 2250 on IS  Abd: Distended and protuberant but soft, tenderness of the around umbilical incision and in the LLQ.   No rigidity or guarding.  +BS. Umbilical laparoscopic incision draining transparent yellow drainage as noted below. Incision with scant erythema surrounding but no induration, heat or fluctuance. No obvious opening for where the drainage is coming from. Remaining laparoscopic incisions are c/d/i and without drainage or signs of infection.  Ext: No erythema, edema, or tenderness BUE/BLE  Psych: A&Ox3  Skin: no rashes noted, warm and dry  Lab Results:  Recent Labs    12/02/19 0214 12/03/19 0302  WBC 15.6* 17.5*  HGB 14.2 14.3  HCT 43.5 43.5  PLT 298 332   BMET Recent Labs    12/02/19 0214 12/03/19 0302  NA 137 137  K 3.8 3.6  CL 107 108  CO2 22 22  GLUCOSE 104* 111*  BUN 8 6  CREATININE 0.89  0.79  CALCIUM 7.7* 7.9*   PT/INR No results for input(s): LABPROT, INR in the last 72 hours. CMP     Component Value Date/Time   NA 137 12/03/2019 0302   K 3.6 12/03/2019 0302   CL 108 12/03/2019 0302   CO2 22 12/03/2019 0302   GLUCOSE 111 (H) 12/03/2019 0302   BUN 6 12/03/2019 0302   CREATININE 0.79 12/03/2019 0302   CALCIUM 7.9 (L) 12/03/2019 0302   PROT 5.3 (L) 12/03/2019 0302   ALBUMIN 2.2 (L) 12/03/2019 0302   AST 58 (H) 12/03/2019 0302   ALT 39 12/03/2019 0302   ALKPHOS 111 12/03/2019 0302   BILITOT 1.8 (H) 12/03/2019 0302   GFRNONAA >60 12/03/2019 0302   GFRAA >60 12/03/2019 0302   Lipase     Component Value Date/Time   LIPASE 66 (H) 11/28/2019 1324       Studies/Results: CT ABDOMEN PELVIS W CONTRAST  Result Date: 12/01/2019 CLINICAL DATA:  History of recent laparoscopic cholecystectomy with draining wound anteriorly EXAM: CT ABDOMEN AND PELVIS WITH CONTRAST TECHNIQUE: Multidetector CT imaging of the abdomen and pelvis was performed using the standard protocol following bolus administration of intravenous contrast. CONTRAST:  126mL OMNIPAQUE IOHEXOL 300 MG/ML  SOLN COMPARISON:  11/28/2019 FINDINGS: Lower chest: No focal infiltrate or sizable effusion is seen. Small granuloma is noted in the right lower lobe. Hepatobiliary: Liver is diffusely fatty infiltrated. The gallbladder has  been surgically removed. There is a mild amount of fluid within the gallbladder fossa although no true biloma is noted. There is however new increased free fluid within the abdomen surrounding the liver and spleen and extending into the pelvis likely related to bile leak despite interval placement of biliary stent. The stent remains in adequate position. Pancreas: Unremarkable. No pancreatic ductal dilatation or surrounding inflammatory changes. Spleen: Normal in size without focal abnormality. Adrenals/Urinary Tract: Adrenal glands are unremarkable. Kidneys demonstrate a normal enhancement  pattern bilaterally. No renal calculi are seen. Delayed images demonstrate normal excretion of contrast material. The bladder is well distended. Stomach/Bowel: The appendix is well visualized. No obstructive or inflammatory change of the colon or small bowel is seen. The stomach is within normal limits. Vascular/Lymphatic: No significant vascular findings are present. No enlarged abdominal or pelvic lymph nodes. Reproductive: Prostate is unremarkable. Other: Free fluid is noted within the abdomen and pelvis as described likely related to biliary leak. This is new from the prior exam of 11/28/2019. This may also have occurred in the interval between the original CT and subsequent ERCP. Musculoskeletal: No acute or significant osseous findings. Mild inflammatory changes are noted in the abdominal wall related to the recent surgery. These are stable in appearance from the prior exam. No large subcutaneous fluid collection is noted. IMPRESSION: Free fluid within the abdomen and pelvis as described likely related to a bile leak. Biliary stent remains in satisfactory position. Inflammatory changes related to prior laparoscopic cholecystectomy without definitive subcutaneous fluid collection. No other focal abnormality is noted. Electronically Signed   By: Inez Catalina M.D.   On: 12/01/2019 15:57   DG CHEST PORT 1 VIEW  Result Date: 12/03/2019 CLINICAL DATA:  Leukocytosis EXAM: PORTABLE CHEST 1 VIEW COMPARISON:  11/28/2019 FINDINGS: Heart and mediastinal contours are within normal limits. No focal opacities or effusions. No acute bony abnormality. IMPRESSION: No active cardiopulmonary disease. Electronically Signed   By: Rolm Baptise M.D.   On: 12/03/2019 09:13   NM HEPATO BILIARY LEAK  Result Date: 12/03/2019 CLINICAL DATA:  Inpatient. Status post laparoscopic cholecystectomy 11/22/2019. ERCP 11/29/2019 with CBD stricture status post CBD stent placement. Bile like fluid leaking from umbilical incision site.  Elevated bilirubin. EXAM: NUCLEAR MEDICINE HEPATOBILIARY IMAGING TECHNIQUE: Sequential images of the abdomen were obtained out to 60 minutes following intravenous administration of radiopharmaceutical. RADIOPHARMACEUTICALS:  7.4 mCi Tc-44m  Choletec IV COMPARISON:  12/01/2019 CT abdomen/pelvis. FINDINGS: Prompt uptake and biliary excretion of activity by the liver is seen. Biliary activity passes promptly into small bowel, consistent with patent common bile duct. There is no evidence of extraluminal biliary activity during the 2 hours of scintigraphic imaging to suggest a bile leak. IMPRESSION: Biliary activity passes promptly into the small bowel, consistent with patent common bile duct. No scintigraphic evidence of bile leak. Electronically Signed   By: Ilona Sorrel M.D.   On: 12/03/2019 07:25    Anti-infectives: Anti-infectives (From admission, onward)   Start     Dose/Rate Route Frequency Ordered Stop   11/28/19 1745  ceFEPIme (MAXIPIME) 2 g in sodium chloride 0.9 % 100 mL IVPB     2 g 200 mL/hr over 30 Minutes Intravenous Every 8 hours 11/28/19 1705     11/28/19 1715  metroNIDAZOLE (FLAGYL) IVPB 500 mg     500 mg 100 mL/hr over 60 Minutes Intravenous  Once 11/28/19 1705 11/28/19 2130       Assessment/Plan Hx of Acute Cholecystitis with hydrops - s/p Lap Chole, 4/19, Dr.  Thompson CBD stricture - s/p ERCP with stenting 4/26 Leukocytosis - CT scan 4/28 shows large amount of intra-abdominal free fluid that is suspected be from bile leak.  No evidence of intra-abdominal abscess. -ERCP on 4/26 showed no evidence of bile leak.  - HIDA 4/29 w/ patent CBD and no evidence of bile leak -Patient continues to have leukocytosis  that is uptrending.  Will check chest x-ray and UA   FEN -Reg  VTE -SCDs, subq heparin ID -Flagyl x 1, Cefepime 4/25 >> Follow-Up: Dr. Grandville Silos and GI   LOS: 5 days    Jillyn Ledger , [facility] 12/03/2019, 9:45 AM Please see Amion for  pager number during day hours 7:00am-4:30pm

## 2019-12-03 NOTE — Progress Notes (Signed)
RN called to room by NT due to pt having nose bleed. Pt had small blood clots coming from nose and was also spitting up blood. After about 10 minutes of getting to room nose bleed stopped. On call MD for [facility] notified. Instructed to hold pressure to bridge of nose. Using interpreter informed pt to do this. He denies history of nose bleed. He stated "I was falling asleep and felt something hot in the back of my throat and found out it was blood." RN instructed pt to hold bridge of nose should it start bleeding again and to call RN. He verbalized understanding.

## 2019-12-04 LAB — CBC
HCT: 43.7 % (ref 39.0–52.0)
Hemoglobin: 14.3 g/dL (ref 13.0–17.0)
MCH: 33.4 pg (ref 26.0–34.0)
MCHC: 32.7 g/dL (ref 30.0–36.0)
MCV: 102.1 fL — ABNORMAL HIGH (ref 80.0–100.0)
Platelets: 347 10*3/uL (ref 150–400)
RBC: 4.28 MIL/uL (ref 4.22–5.81)
RDW: 13.5 % (ref 11.5–15.5)
WBC: 14.7 10*3/uL — ABNORMAL HIGH (ref 4.0–10.5)
nRBC: 0 % (ref 0.0–0.2)

## 2019-12-04 LAB — CULTURE, BLOOD (ROUTINE X 2)
Culture: NO GROWTH
Special Requests: ADEQUATE

## 2019-12-04 NOTE — Plan of Care (Signed)

## 2019-12-04 NOTE — Anesthesia Postprocedure Evaluation (Deleted)
Anesthesia Post Note  Patient: Micheal Watson  Procedure(s) Performed: ENDOSCOPIC RETROGRADE CHOLANGIOPANCREATOGRAPHY (ERCP) (N/A ) SPHINCTEROTOMY BILIARY DILATION BILIARY BRUSHING BILIARY STENT PLACEMENT     Patient location during evaluation: PACU Anesthesia Type: General Level of consciousness: patient cooperative and awake Pain management: pain level controlled Vital Signs Assessment: post-procedure vital signs reviewed and stable Respiratory status: spontaneous breathing, nonlabored ventilation, respiratory function stable and patient connected to nasal cannula oxygen Cardiovascular status: blood pressure returned to baseline and stable Postop Assessment: no apparent nausea or vomiting Anesthetic complications: no    Last Vitals:  Vitals:   12/03/19 2004 12/04/19 0458  BP: 131/85 111/86  Pulse: 76 75  Resp: 16 16  Temp: 37.1 C 37.1 C  SpO2: 97% 96%    Last Pain:  Vitals:   12/04/19 0819  TempSrc:   PainSc: Asleep                 Adonia Porada

## 2019-12-04 NOTE — Progress Notes (Signed)
5 Days Post-Op   Subjective/Chief Complaint: Feels ok bloated, having some bowel function, tol some po  Objective: Vital signs in last 24 hours: Temp:  [98.7 F (37.1 C)] 98.7 F (37.1 C) (05/01 0458) Pulse Rate:  [75-76] 75 (05/01 0458) Resp:  [16] 16 (05/01 0458) BP: (111-131)/(85-86) 111/86 (05/01 0458) SpO2:  [96 %-97 %] 96 % (05/01 0458) Last BM Date: 12/02/19  Intake/Output from previous day: 04/30 0701 - 05/01 0700 In: 246.3 [P.O.:240; IV Piggyback:6.3] Out: 750 [Urine:750] Intake/Output this shift: Total I/O In: 360 [P.O.:360] Out: 300 [Urine:300]  General: alert, oriented cv rrr Lungs clear abd distended minimally tender, few bs, incision pretty clean today  Lab Results:  Recent Labs    12/03/19 0302 12/04/19 0336  WBC 17.5* 14.7*  HGB 14.3 14.3  HCT 43.5 43.7  PLT 332 347   BMET Recent Labs    12/02/19 0214 12/03/19 0302  NA 137 137  K 3.8 3.6  CL 107 108  CO2 22 22  GLUCOSE 104* 111*  BUN 8 6  CREATININE 0.89 0.79  CALCIUM 7.7* 7.9*   PT/INR No results for input(s): LABPROT, INR in the last 72 hours. ABG No results for input(s): PHART, HCO3 in the last 72 hours.  Invalid input(s): PCO2, PO2  Studies/Results: DG CHEST PORT 1 VIEW  Result Date: 12/03/2019 CLINICAL DATA:  Leukocytosis EXAM: PORTABLE CHEST 1 VIEW COMPARISON:  11/28/2019 FINDINGS: Heart and mediastinal contours are within normal limits. No focal opacities or effusions. No acute bony abnormality. IMPRESSION: No active cardiopulmonary disease. Electronically Signed   By: Rolm Baptise M.D.   On: 12/03/2019 09:13   NM HEPATO BILIARY LEAK  Result Date: 12/03/2019 CLINICAL DATA:  Inpatient. Status post laparoscopic cholecystectomy 11/22/2019. ERCP 11/29/2019 with CBD stricture status post CBD stent placement. Bile like fluid leaking from umbilical incision site. Elevated bilirubin. EXAM: NUCLEAR MEDICINE HEPATOBILIARY IMAGING TECHNIQUE: Sequential images of the abdomen were obtained  out to 60 minutes following intravenous administration of radiopharmaceutical. RADIOPHARMACEUTICALS:  7.4 mCi Tc-9m  Choletec IV COMPARISON:  12/01/2019 CT abdomen/pelvis. FINDINGS: Prompt uptake and biliary excretion of activity by the liver is seen. Biliary activity passes promptly into small bowel, consistent with patent common bile duct. There is no evidence of extraluminal biliary activity during the 2 hours of scintigraphic imaging to suggest a bile leak. IMPRESSION: Biliary activity passes promptly into the small bowel, consistent with patent common bile duct. No scintigraphic evidence of bile leak. Electronically Signed   By: Ilona Sorrel M.D.   On: 12/03/2019 07:25    Anti-infectives: Anti-infectives (From admission, onward)   Start     Dose/Rate Route Frequency Ordered Stop   12/03/19 1400  piperacillin-tazobactam (ZOSYN) IVPB 3.375 g  Status:  Discontinued     3.375 g 100 mL/hr over 30 Minutes Intravenous Every 8 hours 12/03/19 1050 12/03/19 1104   12/03/19 1400  piperacillin-tazobactam (ZOSYN) IVPB 3.375 g     3.375 g 12.5 mL/hr over 240 Minutes Intravenous Every 8 hours 12/03/19 1104     11/28/19 1745  ceFEPIme (MAXIPIME) 2 g in sodium chloride 0.9 % 100 mL IVPB  Status:  Discontinued     2 g 200 mL/hr over 30 Minutes Intravenous Every 8 hours 11/28/19 1705 12/03/19 1050   11/28/19 1715  metroNIDAZOLE (FLAGYL) IVPB 500 mg     500 mg 100 mL/hr over 60 Minutes Intravenous  Once 11/28/19 1705 11/28/19 2130      Assessment/Plan: Hx of Acute Cholecystitis with hydrops - s/p Lap  Chole, 4/19, Dr. Grandville Silos CBD stricture - s/p ERCP with stenting 4/26 Leukocytosis improved - CT scan 4/28 shows large amount of intra-abdominal free fluid that is suspected be from bile leak.  No evidence of intra-abdominal abscess. May need to repeat this in next 48 hours -ERCP on 4/26 showed no evidence of bile leak.  - HIDA4/29 w/ patent CBD and no evidence of bile leak -will advance diet today VTE  -SCDs, subq heparin ID: zosyn Follow-Up: Dr. Grandville Silos and GI  Rolm Bookbinder 12/04/2019

## 2019-12-05 NOTE — Plan of Care (Signed)
  Problem: Education: Goal: Knowledge of General Education information will improve Description: Including pain rating scale, medication(s)/side effects and non-pharmacologic comfort measures Outcome: Progressing   Problem: Health Behavior/Discharge Planning: Goal: Ability to manage health-related needs will improve Outcome: Progressing   Problem: Activity: Goal: Risk for activity intolerance will decrease Outcome: Progressing   Problem: Elimination: Goal: Will not experience complications related to bowel motility Outcome: Progressing   Problem: Pain Managment: Goal: General experience of comfort will improve Outcome: Progressing   Problem: Safety: Goal: Ability to remain free from injury will improve Outcome: Progressing   

## 2019-12-05 NOTE — Progress Notes (Signed)
[facility] Progress Note  6 Days Post-Op  Subjective: CC-  Continues to feel bloated but denies any abdominal pain. No n/v. Passing a little flatus, small BM yesterday.  Objective: Vital signs in last 24 hours: Temp:  [98.1 F (36.7 C)-98.6 F (37 C)] 98.5 F (36.9 C) (05/02 0820) Pulse Rate:  [73-81] 81 (05/02 0820) Resp:  [16-17] 17 (05/02 0820) BP: (106-132)/(66-84) 125/78 (05/02 0820) SpO2:  [90 %-98 %] 93 % (05/02 0820) Last BM Date: 12/02/19  Intake/Output from previous day: 05/01 0701 - 05/02 0700 In: 1080 [P.O.:1080] Out: 600 [Urine:600] Intake/Output this shift: Total I/O In: 240 [P.O.:240] Out: 200 [Urine:200]  PE: Gen:  Alert, NAD, pleasant Pulm:  rate and effort normal Abd: Soft, distended, nontender, tinkling BS, lap incisions C/D/I - dressing over periumbilical incision with no drainage  Lab Results:  Recent Labs    12/03/19 0302 12/04/19 0336  WBC 17.5* 14.7*  HGB 14.3 14.3  HCT 43.5 43.7  PLT 332 347   BMET Recent Labs    12/03/19 0302  NA 137  K 3.6  CL 108  CO2 22  GLUCOSE 111*  BUN 6  CREATININE 0.79  CALCIUM 7.9*   PT/INR No results for input(s): LABPROT, INR in the last 72 hours. CMP     Component Value Date/Time   NA 137 12/03/2019 0302   K 3.6 12/03/2019 0302   CL 108 12/03/2019 0302   CO2 22 12/03/2019 0302   GLUCOSE 111 (H) 12/03/2019 0302   BUN 6 12/03/2019 0302   CREATININE 0.79 12/03/2019 0302   CALCIUM 7.9 (L) 12/03/2019 0302   PROT 5.3 (L) 12/03/2019 0302   ALBUMIN 2.2 (L) 12/03/2019 0302   AST 58 (H) 12/03/2019 0302   ALT 39 12/03/2019 0302   ALKPHOS 111 12/03/2019 0302   BILITOT 1.8 (H) 12/03/2019 0302   GFRNONAA >60 12/03/2019 0302   GFRAA >60 12/03/2019 0302   Lipase     Component Value Date/Time   LIPASE 66 (H) 11/28/2019 1324       Studies/Results: No results found.  Anti-infectives: Anti-infectives (From admission, onward)   Start     Dose/Rate Route Frequency Ordered Stop    12/03/19 1400  piperacillin-tazobactam (ZOSYN) IVPB 3.375 g  Status:  Discontinued     3.375 g 100 mL/hr over 30 Minutes Intravenous Every 8 hours 12/03/19 1050 12/03/19 1104   12/03/19 1400  piperacillin-tazobactam (ZOSYN) IVPB 3.375 g     3.375 g 12.5 mL/hr over 240 Minutes Intravenous Every 8 hours 12/03/19 1104     11/28/19 1745  ceFEPIme (MAXIPIME) 2 g in sodium chloride 0.9 % 100 mL IVPB  Status:  Discontinued     2 g 200 mL/hr over 30 Minutes Intravenous Every 8 hours 11/28/19 1705 12/03/19 1050   11/28/19 1715  metroNIDAZOLE (FLAGYL) IVPB 500 mg     500 mg 100 mL/hr over 60 Minutes Intravenous  Once 11/28/19 1705 11/28/19 2130       Assessment/Plan Hx of Acute Cholecystitis with hydrops - s/p Lap Chole, 4/19, Dr. Grandville Silos CBD stricture -s/p ERCP with stenting 4/26 Leukocytosis improved -CT scan 4/28 shows large amount of intra-abdominal free fluid that is suspected be from bile leak.No evidence of intra-abdominal abscess.  - ERCP on 4/26 showed no evidence of bile leak. - HIDA4/29 w/patent CBD and no evidence of bile leak  FEN - reg diet VTE -SCDs, subq heparin ID: zosyn 4/30>> Follow-Up: Dr. Grandville Silos and GI   Plan: Continue diet, mobilize today.  Likely plan repeat CT scan tomorrow. Repeat labs in AM.   LOS: 7 days    Wellington Hampshire, [facility] 12/05/2019, 11:27 AM Please see Amion for pager number during day hours 7:00am-4:30pm

## 2019-12-05 NOTE — Plan of Care (Signed)
  Problem: Education: ?Goal: Knowledge of General Education information will improve ?Description: Including pain rating scale, medication(s)/side effects and non-pharmacologic comfort measures ?Outcome: Progressing ?  ?Problem: Clinical Measurements: ?Goal: Will remain free from infection ?Outcome: Progressing ?  ?Problem: Activity: ?Goal: Risk for activity intolerance will decrease ?Outcome: Progressing ?  ?Problem: Nutrition: ?Goal: Adequate nutrition will be maintained ?Outcome: Progressing ?  ?Problem: Elimination: ?Goal: Will not experience complications related to bowel motility ?Outcome: Progressing ?  ?Problem: Pain Managment: ?Goal: General experience of comfort will improve ?Outcome: Progressing ?  ?Problem: Safety: ?Goal: Ability to remain free from injury will improve ?Outcome: Progressing ?  ?Problem: Skin Integrity: ?Goal: Risk for impaired skin integrity will decrease ?Outcome: Progressing ?  ?

## 2019-12-06 ENCOUNTER — Inpatient Hospital Stay (HOSPITAL_COMMUNITY): Payer: Self-pay

## 2019-12-06 LAB — COMPREHENSIVE METABOLIC PANEL
ALT: 29 U/L (ref 0–44)
AST: 57 U/L — ABNORMAL HIGH (ref 15–41)
Albumin: 2.5 g/dL — ABNORMAL LOW (ref 3.5–5.0)
Alkaline Phosphatase: 100 U/L (ref 38–126)
Anion gap: 8 (ref 5–15)
BUN: 7 mg/dL (ref 6–20)
CO2: 24 mmol/L (ref 22–32)
Calcium: 8.4 mg/dL — ABNORMAL LOW (ref 8.9–10.3)
Chloride: 105 mmol/L (ref 98–111)
Creatinine, Ser: 0.84 mg/dL (ref 0.61–1.24)
GFR calc Af Amer: >60 mL/min (ref >60)
GFR calc non Af Amer: >60 mL/min (ref >60)
Glucose, Bld: 94 mg/dL (ref 70–99)
Potassium: 4.9 mmol/L (ref 3.5–5.1)
Sodium: 137 mmol/L (ref 135–145)
Total Bilirubin: 1.8 mg/dL — ABNORMAL HIGH (ref 0.3–1.2)
Total Protein: 5.7 g/dL — ABNORMAL LOW (ref 6.5–8.1)

## 2019-12-06 LAB — CBC
HCT: 43.8 % (ref 39.0–52.0)
Hemoglobin: 14.4 g/dL (ref 13.0–17.0)
MCH: 33.3 pg (ref 26.0–34.0)
MCHC: 32.9 g/dL (ref 30.0–36.0)
MCV: 101.4 fL — ABNORMAL HIGH (ref 80.0–100.0)
Platelets: 308 10*3/uL (ref 150–400)
RBC: 4.32 MIL/uL (ref 4.22–5.81)
RDW: 13.2 % (ref 11.5–15.5)
WBC: 12.8 10*3/uL — ABNORMAL HIGH (ref 4.0–10.5)
nRBC: 0 % (ref 0.0–0.2)

## 2019-12-06 MED ORDER — IOHEXOL 9 MG/ML PO SOLN: 500.0000 mL | ORAL | Status: AC

## 2019-12-06 MED ORDER — SODIUM CHLORIDE 0.9 % IV SOLN: INTRAVENOUS | Status: DC

## 2019-12-06 MED ORDER — IOHEXOL 9 MG/ML PO SOLN
ORAL | Status: AC
  Filled 2019-12-06: qty 500

## 2019-12-06 MED ORDER — IOHEXOL 300 MG/ML  SOLN
100.0000 mL | Freq: Once | INTRAMUSCULAR | Status: AC | PRN
  Administered 2019-12-06: 100 mL via INTRAVENOUS

## 2019-12-06 NOTE — Progress Notes (Signed)
Patient ID: Micheal Watson, male   DOB: 23-Apr-1961, 59 y.o.   MRN: [id number]    7 Days Post-Op  Subjective: Eating well.  Still with some abdominal tenderness but on the left side.  No drainage from umbilicus in 3 days.  + flatus.  No nausea  ROS: See above, otherwise other systems negative  Objective: Vital signs in last 24 hours: Temp:  [98.5 F (36.9 C)-98.6 F (37 C)] 98.5 F (36.9 C) (05/03 0506) Pulse Rate:  [81-85] 81 (05/03 0506) Resp:  [15-17] 15 (05/03 0506) BP: (125-134)/(79-87) 125/87 (05/03 0506) SpO2:  [95 %-97 %] 97 % (05/03 0506) Last BM Date: 12/04/19  Intake/Output from previous day: 05/02 0701 - 05/03 0700 In: 480 [P.O.:480] Out: 2600 [Urine:2600] Intake/Output this shift: Total I/O In: 360 [P.O.:360] Out: -   PE: Heart: regular Lungs: CTAB Abd: soft, bloated, +BS, more tender on left side of abdomen than right.  No drainage from umbilical incision.  All other incision are c/d/i with no erythema surrounding them.  Lab Results:  Recent Labs    12/04/19 0336 12/06/19 0503  WBC 14.7* 12.8*  HGB 14.3 14.4  HCT 43.7 43.8  PLT 347 308   BMET Recent Labs    12/06/19 0503  NA 137  K 4.9  CL 105  CO2 24  GLUCOSE 94  BUN 7  CREATININE 0.84  CALCIUM 8.4*   PT/INR No results for input(s): LABPROT, INR in the last 72 hours. CMP     Component Value Date/Time   NA 137 12/06/2019 0503   K 4.9 12/06/2019 0503   CL 105 12/06/2019 0503   CO2 24 12/06/2019 0503   GLUCOSE 94 12/06/2019 0503   BUN 7 12/06/2019 0503   CREATININE 0.84 12/06/2019 0503   CALCIUM 8.4 (L) 12/06/2019 0503   PROT 5.7 (L) 12/06/2019 0503   ALBUMIN 2.5 (L) 12/06/2019 0503   AST 57 (H) 12/06/2019 0503   ALT 29 12/06/2019 0503   ALKPHOS 100 12/06/2019 0503   BILITOT 1.8 (H) 12/06/2019 0503   GFRNONAA >60 12/06/2019 0503   GFRAA >60 12/06/2019 0503   Lipase     Component Value Date/Time   LIPASE 66 (H) 11/28/2019 1324       Studies/Results: No results  found.  Anti-infectives: Anti-infectives (From admission, onward)   Start     Dose/Rate Route Frequency Ordered Stop   12/03/19 1400  piperacillin-tazobactam (ZOSYN) IVPB 3.375 g  Status:  Discontinued     3.375 g 100 mL/hr over 30 Minutes Intravenous Every 8 hours 12/03/19 1050 12/03/19 1104   12/03/19 1400  piperacillin-tazobactam (ZOSYN) IVPB 3.375 g     3.375 g 12.5 mL/hr over 240 Minutes Intravenous Every 8 hours 12/03/19 1104     11/28/19 1745  ceFEPIme (MAXIPIME) 2 g in sodium chloride 0.9 % 100 mL IVPB  Status:  Discontinued     2 g 200 mL/hr over 30 Minutes Intravenous Every 8 hours 11/28/19 1705 12/03/19 1050   11/28/19 1715  metroNIDAZOLE (FLAGYL) IVPB 500 mg     500 mg 100 mL/hr over 60 Minutes Intravenous  Once 11/28/19 1705 11/28/19 2130       Assessment/Plan Hx of Acute Cholecystitis with hydrops - s/p Lap Chole, 4/19, Dr. Grandville Silos CBD stricture -s/p ERCP with stenting 4/26 Leukocytosisimproved -CT scan 4/28 shows large amount of intra-abdominal free fluid that is suspected be from bile leak.No evidence of intra-abdominal abscess. - ERCP on 4/26 showed no evidence of bile leak. - HIDA4/29 w/patent CBD  and no evidence of bile leak -will repeat CT scan today given ongoing symptoms although leaking has ceased.  FEN - reg diet VTE -SCDs, subq heparin ID: zosyn 4/30>> Follow-Up: Dr. Grandville Silos and GI   Plan: Continue diet, mobilize today. Likely plan repeat CT scan tomorrow. Repeat labs in AM.   LOS: 8 days    Henreitta Cea , [facility] 12/06/2019, 12:15 PM Please see Amion for pager number during day hours 7:00am-4:30pm or 7:00am -11:30am on weekends

## 2019-12-06 NOTE — Plan of Care (Signed)
  Problem: Education: Goal: Knowledge of General Education information will improve Description: Including pain rating scale, medication(s)/side effects and non-pharmacologic comfort measures Outcome: Progressing   Problem: Pain Managment: Goal: General experience of comfort will improve Outcome: Progressing   Problem: Safety: Goal: Ability to remain free from injury will improve Outcome: Progressing   

## 2019-12-07 ENCOUNTER — Encounter (HOSPITAL_COMMUNITY): Payer: Self-pay | Admitting: General Surgery

## 2019-12-07 ENCOUNTER — Encounter (HOSPITAL_COMMUNITY): Admission: EM | Disposition: A | Discharge status: Home / Self Care | Payer: Self-pay | Source: Home / Self Care

## 2019-12-07 ENCOUNTER — Inpatient Hospital Stay (HOSPITAL_COMMUNITY): Payer: Self-pay | Admitting: Certified Registered Nurse Anesthetist

## 2019-12-07 HISTORY — PX: LAPAROSCOPY: SHX197

## 2019-12-07 LAB — COMPREHENSIVE METABOLIC PANEL
ALT: 28 U/L (ref 0–44)
AST: 54 U/L — ABNORMAL HIGH (ref 15–41)
Albumin: 2.4 g/dL — ABNORMAL LOW (ref 3.5–5.0)
Alkaline Phosphatase: 104 U/L (ref 38–126)
Anion gap: 7 (ref 5–15)
BUN: 7 mg/dL (ref 6–20)
CO2: 28 mmol/L (ref 22–32)
Calcium: 8.4 mg/dL — ABNORMAL LOW (ref 8.9–10.3)
Chloride: 101 mmol/L (ref 98–111)
Creatinine, Ser: 0.9 mg/dL (ref 0.61–1.24)
GFR calc Af Amer: >60 mL/min (ref >60)
GFR calc non Af Amer: >60 mL/min (ref >60)
Glucose, Bld: 96 mg/dL (ref 70–99)
Potassium: 4.3 mmol/L (ref 3.5–5.1)
Sodium: 136 mmol/L (ref 135–145)
Total Bilirubin: 0.9 mg/dL (ref 0.3–1.2)
Total Protein: 5.8 g/dL — ABNORMAL LOW (ref 6.5–8.1)

## 2019-12-07 LAB — CBC
HCT: 42.1 % (ref 39.0–52.0)
Hemoglobin: 13.8 g/dL (ref 13.0–17.0)
MCH: 33.5 pg (ref 26.0–34.0)
MCHC: 32.8 g/dL (ref 30.0–36.0)
MCV: 102.2 fL — ABNORMAL HIGH (ref 80.0–100.0)
Platelets: 325 10*3/uL (ref 150–400)
RBC: 4.12 MIL/uL — ABNORMAL LOW (ref 4.22–5.81)
RDW: 13.1 % (ref 11.5–15.5)
WBC: 12.7 10*3/uL — ABNORMAL HIGH (ref 4.0–10.5)
nRBC: 0 % (ref 0.0–0.2)

## 2019-12-07 SURGERY — LAPAROSCOPY, DIAGNOSTIC
Anesthesia: General

## 2019-12-07 MED ORDER — ROCURONIUM BROMIDE 10 MG/ML (PF) SYRINGE
PREFILLED_SYRINGE | INTRAVENOUS | Status: DC | PRN
  Administered 2019-12-07: 50 mg via INTRAVENOUS

## 2019-12-07 MED ORDER — LIDOCAINE 2% (20 MG/ML) 5 ML SYRINGE
INTRAMUSCULAR | Status: DC | PRN
  Administered 2019-12-07: 100 mg via INTRAVENOUS

## 2019-12-07 MED ORDER — MIDAZOLAM HCL 2 MG/2ML IJ SOLN
INTRAMUSCULAR | Status: AC
  Filled 2019-12-07: qty 2

## 2019-12-07 MED ORDER — HYDROMORPHONE HCL 1 MG/ML IJ SOLN
INTRAMUSCULAR | Status: AC
  Filled 2019-12-07: qty 1

## 2019-12-07 MED ORDER — HYDROMORPHONE HCL 1 MG/ML IJ SOLN
0.2500 mg | INTRAMUSCULAR | Status: DC | PRN
  Administered 2019-12-07 (×2): 0.5 mg via INTRAVENOUS

## 2019-12-07 MED ORDER — FENTANYL CITRATE (PF) 250 MCG/5ML IJ SOLN
INTRAMUSCULAR | Status: DC | PRN
  Administered 2019-12-07 (×2): 100 ug via INTRAVENOUS
  Administered 2019-12-07: 50 ug via INTRAVENOUS

## 2019-12-07 MED ORDER — PHENYLEPHRINE 40 MCG/ML (10ML) SYRINGE FOR IV PUSH (FOR BLOOD PRESSURE SUPPORT)
PREFILLED_SYRINGE | INTRAVENOUS | Status: AC
  Filled 2019-12-07: qty 10

## 2019-12-07 MED ORDER — HEPARIN SODIUM (PORCINE) 5000 UNIT/ML IJ SOLN: 5000.0000 [IU] | Freq: Three times a day (TID) | INTRAMUSCULAR | Status: DC

## 2019-12-07 MED ORDER — LIDOCAINE 2% (20 MG/ML) 5 ML SYRINGE
INTRAMUSCULAR | Status: AC
  Filled 2019-12-07: qty 5

## 2019-12-07 MED ORDER — DEXAMETHASONE SODIUM PHOSPHATE 10 MG/ML IJ SOLN
INTRAMUSCULAR | Status: AC
  Filled 2019-12-07: qty 2

## 2019-12-07 MED ORDER — BUPIVACAINE-EPINEPHRINE 0.25% -1:200000 IJ SOLN
INTRAMUSCULAR | Status: DC | PRN
  Administered 2019-12-07: 10 mL

## 2019-12-07 MED ORDER — MIDAZOLAM HCL 5 MG/5ML IJ SOLN
INTRAMUSCULAR | Status: DC | PRN
  Administered 2019-12-07: 2 mg via INTRAVENOUS

## 2019-12-07 MED ORDER — SODIUM CHLORIDE 0.9 % IV SOLN: INTRAVENOUS | Status: DC

## 2019-12-07 MED ORDER — DEXAMETHASONE SODIUM PHOSPHATE 10 MG/ML IJ SOLN
INTRAMUSCULAR | Status: DC | PRN
  Administered 2019-12-07: 10 mg via INTRAVENOUS

## 2019-12-07 MED ORDER — ROCURONIUM BROMIDE 10 MG/ML (PF) SYRINGE
PREFILLED_SYRINGE | INTRAVENOUS | Status: AC
  Filled 2019-12-07: qty 10

## 2019-12-07 MED ORDER — SUCCINYLCHOLINE CHLORIDE 200 MG/10ML IV SOSY
PREFILLED_SYRINGE | INTRAVENOUS | Status: AC
  Filled 2019-12-07: qty 10

## 2019-12-07 MED ORDER — 0.9 % SODIUM CHLORIDE (POUR BTL) OPTIME
TOPICAL | Status: DC | PRN
  Administered 2019-12-07: 1000 mL

## 2019-12-07 MED ORDER — OXYCODONE HCL 5 MG/5ML PO SOLN: 5.0000 mg | Freq: Once | ORAL | Status: DC | PRN

## 2019-12-07 MED ORDER — SUCCINYLCHOLINE CHLORIDE 200 MG/10ML IV SOSY
PREFILLED_SYRINGE | INTRAVENOUS | Status: DC | PRN
  Administered 2019-12-07: 140 mg via INTRAVENOUS

## 2019-12-07 MED ORDER — BUPIVACAINE HCL (PF) 0.25 % IJ SOLN
INTRAMUSCULAR | Status: AC
  Filled 2019-12-07: qty 30

## 2019-12-07 MED ORDER — SUGAMMADEX SODIUM 200 MG/2ML IV SOLN
INTRAVENOUS | Status: DC | PRN
  Administered 2019-12-07: 200 mg via INTRAVENOUS

## 2019-12-07 MED ORDER — PROPOFOL 10 MG/ML IV BOLUS
INTRAVENOUS | Status: DC | PRN
  Administered 2019-12-07: 160 mg via INTRAVENOUS

## 2019-12-07 MED ORDER — FENTANYL CITRATE (PF) 250 MCG/5ML IJ SOLN
INTRAMUSCULAR | Status: AC
  Filled 2019-12-07: qty 5

## 2019-12-07 MED ORDER — LACTATED RINGERS IV SOLN: INTRAVENOUS | Status: DC

## 2019-12-07 MED ORDER — OXYCODONE HCL 5 MG PO TABS: 5.0000 mg | ORAL_TABLET | Freq: Once | ORAL | Status: DC | PRN

## 2019-12-07 MED ORDER — ONDANSETRON HCL 4 MG/2ML IJ SOLN
INTRAMUSCULAR | Status: AC
  Filled 2019-12-07: qty 2

## 2019-12-07 MED ORDER — PROPOFOL 10 MG/ML IV BOLUS
INTRAVENOUS | Status: AC
  Filled 2019-12-07: qty 20

## 2019-12-07 MED ORDER — ONDANSETRON HCL 4 MG/2ML IJ SOLN
INTRAMUSCULAR | Status: DC | PRN
  Administered 2019-12-07: 4 mg via INTRAVENOUS

## 2019-12-07 MED ORDER — EPHEDRINE 5 MG/ML INJ
INTRAVENOUS | Status: AC
  Filled 2019-12-07: qty 10

## 2019-12-07 MED ORDER — PROMETHAZINE HCL 25 MG/ML IJ SOLN: 6.2500 mg | INTRAMUSCULAR | Status: DC | PRN

## 2019-12-07 MED ORDER — SODIUM CHLORIDE 0.9 % IR SOLN
Status: DC | PRN
  Administered 2019-12-07: 1000 mL

## 2019-12-07 SURGICAL SUPPLY — 69 items
APPLIER CLIP 5 13 M/L LIGAMAX5 (MISCELLANEOUS)
BIOPATCH RED 1 DISK 7.0 (GAUZE/BANDAGES/DRESSINGS) ×2 IMPLANT
BIOPATCH RED 1IN DISK 7.0MM (GAUZE/BANDAGES/DRESSINGS) ×1
BLADE CLIPPER SURG (BLADE) ×3 IMPLANT
CANISTER SUCT 3000ML PPV (MISCELLANEOUS) IMPLANT
CHLORAPREP W/TINT 26 (MISCELLANEOUS) ×3 IMPLANT
CLIP APPLIE 5 13 M/L LIGAMAX5 (MISCELLANEOUS) IMPLANT
COVER SURGICAL LIGHT HANDLE (MISCELLANEOUS) ×3 IMPLANT
COVER WAND RF STERILE (DRAPES) IMPLANT
DERMABOND ADVANCED (GAUZE/BANDAGES/DRESSINGS) ×2
DERMABOND ADVANCED .7 DNX12 (GAUZE/BANDAGES/DRESSINGS) ×1 IMPLANT
DRAIN CHANNEL 19F RND (DRAIN) ×3 IMPLANT
DRAPE LAPAROSCOPIC ABDOMINAL (DRAPES) ×3 IMPLANT
DRAPE WARM FLUID 44X44 (DRAPES) ×3 IMPLANT
DRSG OPSITE POSTOP 4X10 (GAUZE/BANDAGES/DRESSINGS) IMPLANT
DRSG OPSITE POSTOP 4X8 (GAUZE/BANDAGES/DRESSINGS) IMPLANT
DRSG TEGADERM 4X4.75 (GAUZE/BANDAGES/DRESSINGS) ×12 IMPLANT
ELECT BLADE 6.5 EXT (BLADE) IMPLANT
ELECT CAUTERY BLADE 6.4 (BLADE) IMPLANT
ELECT REM PT RETURN 9FT ADLT (ELECTROSURGICAL) ×3
ELECTRODE REM PT RTRN 9FT ADLT (ELECTROSURGICAL) ×1 IMPLANT
EVACUATOR SILICONE 100CC (DRAIN) ×3 IMPLANT
GLOVE BIO SURGEON STRL SZ7 (GLOVE) ×3 IMPLANT
GLOVE BIOGEL PI IND STRL 7.5 (GLOVE) ×1 IMPLANT
GLOVE BIOGEL PI INDICATOR 7.5 (GLOVE) ×2
GOWN STRL REUS W/ TWL LRG LVL3 (GOWN DISPOSABLE) ×3 IMPLANT
GOWN STRL REUS W/TWL LRG LVL3 (GOWN DISPOSABLE) ×6
GRASPER SUT TROCAR 14GX15 (MISCELLANEOUS) ×3 IMPLANT
HANDLE SUCTION POOLE (INSTRUMENTS) ×1 IMPLANT
KIT BASIN OR (CUSTOM PROCEDURE TRAY) ×3 IMPLANT
KIT TURNOVER KIT B (KITS) ×3 IMPLANT
LIGASURE IMPACT 36 18CM CVD LR (INSTRUMENTS) IMPLANT
NS IRRIG 1000ML POUR BTL (IV SOLUTION) ×3 IMPLANT
PACK GENERAL/GYN (CUSTOM PROCEDURE TRAY) ×3 IMPLANT
PAD ARMBOARD 7.5X6 YLW CONV (MISCELLANEOUS) ×3 IMPLANT
PENCIL BUTTON HOLSTER BLD 10FT (ELECTRODE) ×3 IMPLANT
PENCIL SMOKE EVACUATOR (MISCELLANEOUS) IMPLANT
SCISSORS LAP 5X35 DISP (ENDOMECHANICALS) IMPLANT
SET IRRIG TUBING LAPAROSCOPIC (IRRIGATION / IRRIGATOR) IMPLANT
SET TUBE SMOKE EVAC HIGH FLOW (TUBING) ×3 IMPLANT
SLEEVE ENDOPATH XCEL 5M (ENDOMECHANICALS) ×3 IMPLANT
SPECIMEN JAR LARGE (MISCELLANEOUS) IMPLANT
SPONGE GAUZE 2X2 8PLY STER LF (GAUZE/BANDAGES/DRESSINGS) ×1
SPONGE GAUZE 2X2 8PLY STRL LF (GAUZE/BANDAGES/DRESSINGS) ×2 IMPLANT
SPONGE LAP 18X18 RF (DISPOSABLE) IMPLANT
STAPLER VISISTAT 35W (STAPLE) ×3 IMPLANT
SUCTION POOLE HANDLE (INSTRUMENTS) ×3
SUT ETHILON 2 0 FS 18 (SUTURE) ×3 IMPLANT
SUT MNCRL 0 MO-4 VIOLET 18 CR (SUTURE) ×1 IMPLANT
SUT MONOCRYL 0 MO 4 18  CR/8 (SUTURE) ×2
SUT PDS AB 1 TP1 96 (SUTURE) IMPLANT
SUT SILK 2 0 (SUTURE)
SUT SILK 2 0 SH CR/8 (SUTURE) IMPLANT
SUT SILK 2-0 18XBRD TIE 12 (SUTURE) IMPLANT
SUT SILK 3 0 (SUTURE)
SUT SILK 3 0 SH CR/8 (SUTURE) IMPLANT
SUT SILK 3-0 18XBRD TIE 12 (SUTURE) IMPLANT
SUT VIC AB 3-0 SH 27 (SUTURE)
SUT VIC AB 3-0 SH 27X BRD (SUTURE) IMPLANT
SUT VICRYL 0 UR6 27IN ABS (SUTURE) ×9 IMPLANT
TOWEL GREEN STERILE (TOWEL DISPOSABLE) ×3 IMPLANT
TOWEL GREEN STERILE FF (TOWEL DISPOSABLE) ×3 IMPLANT
TRAY FOLEY MTR SLVR 14FR STAT (SET/KITS/TRAYS/PACK) IMPLANT
TRAY FOLEY MTR SLVR 16FR STAT (SET/KITS/TRAYS/PACK) IMPLANT
TRAY LAPAROSCOPIC MC (CUSTOM PROCEDURE TRAY) ×3 IMPLANT
TROCAR XCEL BLUNT TIP 100MML (ENDOMECHANICALS) IMPLANT
TROCAR XCEL NON-BLD 11X100MML (ENDOMECHANICALS) IMPLANT
TROCAR XCEL NON-BLD 5MMX100MML (ENDOMECHANICALS) ×3 IMPLANT
YANKAUER SUCT BULB TIP NO VENT (SUCTIONS) IMPLANT

## 2019-12-07 NOTE — Op Note (Signed)
Preoperative diagnosis:likely bile leak s/p stent with ascites Postoperative diagnosis:biliary ascites Procedure: dx laparoscopy, evacuation of bilious ascites 3L, drain placement Surgeon: Dr. Serita Grammes Anesthesia: General Estimated blood loss: 10 cc Complications: None Drains: None Specimens:none Sponge needle count was correct at completion Disposition to recovery in stable condition  Indications:59 yom who has undergone lap chole 4/19 then underwent ercp with stent 4/26. Has question of bile leak not demonstrated on ercp and hida scan but has a lot of ascites present. I discussed returning to OR to diagnose problem and evacuate ascites.   Procedure: After informed consent was obtained the patient was taken the operating room.He had already received antibiotics. SCDs were in place.He was placed under general anesthesia without complication. His abdomen was prepped and draped in the standard sterile surgical fashion. Surgical timeout was then performed.  I infiltrated Marcaine at his prior supraumbilical incision.  I then reentered his old incision.  He had a small hernia at that site as soon as I entered the skin.  This was present on a previous CT scan as well.  There was fluid that came out immediately upon entering in this incision.  I then placed a 0 Vicryl pursestring suture through the fascia.  I then inserted a Hassan trocar and insufflated the abdomen to 15 mmHg pressure.  He was noted to have a large amount of ascites in the perihepatic region as well as in his pelvis.  This was all ileus.  I then placed 2 further 5 mm trochars in the left side of the abdomen.  I evacuated a total of 3 L of bile from his right upper quadrant in his pelvis.  I then ran the small bowel from the terminal ileum back and there was no evidence of any bowel injury at all I think this was just the result of a bile leak.  Even though there was no bile leak noted on any scans and he has been stented at  this point I still did place a 81 [place] Blake drain in his right upper quadrant adjacent to his gallbladder fossa underneath the omentum that was adherent to his liver.  I then removed my umbilical trocar.  I used the PMI device to place several sutures to close this defect.  I then remove the remaining trochars and desufflated the abdomen.  I then closed these with staples.  His drain was functional upon completion.  He tolerated as well as extubated and transferred recovery stable.

## 2019-12-07 NOTE — Anesthesia Procedure Notes (Signed)
Procedure Name: Intubation Date/Time: 12/07/2019 4:09 PM Performed by: Shirlyn Goltz, CRNA Pre-anesthesia Checklist: Emergency Drugs available, Patient identified, Suction available and Patient being monitored Patient Re-evaluated:Patient Re-evaluated prior to induction Oxygen Delivery Method: Circle system utilized Preoxygenation: Pre-oxygenation with 100% oxygen Induction Type: Rapid sequence and Cricoid Pressure applied Laryngoscope Size: Mac and 4 Grade View: Grade I Tube type: Oral Tube size: 7.5 mm Number of attempts: 1 Airway Equipment and Method: Stylet Placement Confirmation: ETT inserted through vocal cords under direct vision,  positive ETCO2 and breath sounds checked- equal and bilateral Secured at: 22 cm Tube secured with: Tape Dental Injury: Teeth and Oropharynx as per pre-operative assessment

## 2019-12-07 NOTE — Plan of Care (Signed)

## 2019-12-07 NOTE — Progress Notes (Signed)
Patient ID: Micheal Watson, male   DOB: 07/30/1961, 59 y.o.   MRN: [id number]    8 Days Post-Op  Subjective: Patient still with abdominal pain today that is really no better.  CT scan reviewed.  ROS: See above, otherwise other systems negative  Objective: Vital signs in last 24 hours: Temp:  [98.3 F (36.8 C)-98.7 F (37.1 C)] 98.3 F (36.8 C) (05/04 0811) Pulse Rate:  [71-84] 71 (05/04 0811) Resp:  [15-18] 16 (05/04 0811) BP: (112-135)/(80-85) 113/83 (05/04 0811) SpO2:  [95 %-96 %] 96 % (05/04 0811) Last BM Date: 12/04/19  Intake/Output from previous day: 05/03 0701 - 05/04 0700 In: 1610 [P.O.:1160; IV Piggyback:450] Out: 1100 [Urine:1100] Intake/Output this shift: No intake/output data recorded.  PE: Heart: regular Lungs: CTAB Abd: soft, but still distended, no drainage from any of his incisions currently, tender still greater on left than right  Lab Results:  Recent Labs    12/06/19 0503 12/07/19 0652  WBC 12.8* 12.7*  HGB 14.4 13.8  HCT 43.8 42.1  PLT 308 325   BMET Recent Labs    12/06/19 0503 12/07/19 0652  NA 137 136  K 4.9 4.3  CL 105 101  CO2 24 28  GLUCOSE 94 96  BUN 7 7  CREATININE 0.84 0.90  CALCIUM 8.4* 8.4*   PT/INR No results for input(s): LABPROT, INR in the last 72 hours. CMP     Component Value Date/Time   NA 136 12/07/2019 0652   K 4.3 12/07/2019 0652   CL 101 12/07/2019 0652   CO2 28 12/07/2019 0652   GLUCOSE 96 12/07/2019 0652   BUN 7 12/07/2019 0652   CREATININE 0.90 12/07/2019 0652   CALCIUM 8.4 (L) 12/07/2019 0652   PROT 5.8 (L) 12/07/2019 0652   ALBUMIN 2.4 (L) 12/07/2019 0652   AST 54 (H) 12/07/2019 0652   ALT 28 12/07/2019 0652   ALKPHOS 104 12/07/2019 0652   BILITOT 0.9 12/07/2019 0652   GFRNONAA >60 12/07/2019 0652   GFRAA >60 12/07/2019 0652   Lipase     Component Value Date/Time   LIPASE 66 (H) 11/28/2019 1324       Studies/Results: CT ABDOMEN PELVIS W CONTRAST  Result Date:  12/06/2019 CLINICAL DATA:  Acute abdominal pain. Umbilical drainage. Postoperative for laparoscopic cholecystectomy. HIDA scan was negative for leak. Prior ERCP with stenting. EXAM: CT ABDOMEN AND PELVIS WITH CONTRAST TECHNIQUE: Multidetector CT imaging of the abdomen and pelvis was performed using the standard protocol following bolus administration of intravenous contrast. CONTRAST:  139mL OMNIPAQUE IOHEXOL 300 MG/ML  SOLN COMPARISON:  12/01/2019 CT scan FINDINGS: Lower chest: Subsegmental atelectasis in the left lower lobe. Mild cardiomegaly. Hepatobiliary: 7 mm hypodense lesion in the right hepatic lobe on image 44/3, stable and technically nonspecific although statistically likely to be benign/incidental. No biliary dilatation. A plastic biliary stent extends in the common bile duct and to the duodenum. Postoperative findings from recent cholecystectomy. Pancreas: Unremarkable Spleen: There bandlike lucency along the periphery of the posterior spleen for example on image 25/3, probably a splenic cleft although a splenic laceration could appear similar. Adrenals/Urinary Tract: Unremarkable Stomach/Bowel: Unremarkable Vascular/Lymphatic: Unremarkable Reproductive: Unremarkable Other: Moderate to large amount of ascites scattered in the abdomen and pelvis. No free intraperitoneal gas. This is a fluid density without enhancing margins or findings of abscess. Musculoskeletal: Old healed left-sided rib fractures. Stable rim sclerotic lesion in the left femoral head is nonspecific and likely incidental. Mild lumbar spondylosis and degenerative disc disease. IMPRESSION: 1. Moderate to large  amount of ascites scattered in the abdomen and pelvis. The cause of the ascites is indeterminate. There is no free air or abnormal enhancement along the ascites margins to suggest perforation or active peritonitis, although correlation with physical exam is suggested in this regard. Recent hepatobiliary scan was negative for bile  leak. There is a small linear lucency along the posterior margin of the spleen but this seems more likely to be a cleft than a laceration, particularly in the absence of known trauma in this vicinity. The lack of a dropping hemoglobin argues against a splenic injury is the cause for the ascites. 2. A plastic biliary stent extends in the common bile duct and to the duodenum. No biliary dilatation. 3. Mild cardiomegaly. 4. Subsegmental atelectasis in the left lower lobe. 5. Mild lumbar spondylosis and degenerative disc disease. Electronically Signed   By: Van Clines M.D.   On: 12/06/2019 16:40    Anti-infectives: Anti-infectives (From admission, onward)   Start     Dose/Rate Route Frequency Ordered Stop   12/03/19 1400  piperacillin-tazobactam (ZOSYN) IVPB 3.375 g  Status:  Discontinued     3.375 g 100 mL/hr over 30 Minutes Intravenous Every 8 hours 12/03/19 1050 12/03/19 1104   12/03/19 1400  piperacillin-tazobactam (ZOSYN) IVPB 3.375 g     3.375 g 12.5 mL/hr over 240 Minutes Intravenous Every 8 hours 12/03/19 1104     11/28/19 1745  ceFEPIme (MAXIPIME) 2 g in sodium chloride 0.9 % 100 mL IVPB  Status:  Discontinued     2 g 200 mL/hr over 30 Minutes Intravenous Every 8 hours 11/28/19 1705 12/03/19 1050   11/28/19 1715  metroNIDAZOLE (FLAGYL) IVPB 500 mg     500 mg 100 mL/hr over 60 Minutes Intravenous  Once 11/28/19 1705 11/28/19 2130       Assessment/Plan Hx of Acute Cholecystitis with hydrops - s/p Lap Chole, 4/19, Dr. Grandville Silos, POD 15 CBD stricture -s/p ERCP with stenting 4/26 Leukocytosisstable -CT scan 4/28 shows large amount of intra-abdominal free fluid that is suspected be from bile leak.No evidence of intra-abdominal abscess. - ERCP on 4/26 showed no evidence of bile leak. - HIDA4/29 w/patent CBD and no evidence of bile leak -new CT scan still shows persistent moderate to large amount of fluid of unclear etiology.  Plan to return to OR today for dx lap for  exploration to determine source of fluid, whether bile leak, bowel injury, or just ascites of unclear etiology.  This has been discussed with the patient by Dr. Donne Hazel.  The patient understands and is agreeable to proceed.  FEN - NPO VTE -SCDs, subq heparin ID: [id number]/30>> Follow-Up: Dr. Grandville Silos and GI .   LOS: 9 days    Henreitta Cea , [facility] Surgery 12/07/2019, 9:16 AM Please see Amion for pager number during day hours 7:00am-4:30pm or 7:00am -11:30am on weekends

## 2019-12-07 NOTE — Anesthesia Preprocedure Evaluation (Signed)
Anesthesia Evaluation  Patient identified by MRN, date of birth, ID band Patient awake    Reviewed: Allergy & Precautions, NPO status , Patient's Chart, lab work & pertinent test results  Airway Mallampati: III  TM Distance: >3 FB Neck ROM: Full    Dental  (+) Dental Advisory Given   Pulmonary former smoker,    breath sounds clear to auscultation       Cardiovascular hypertension,  Rhythm:Regular     Neuro/Psych    GI/Hepatic (+) Hepatitis -  Endo/Other  Morbid obesity  Renal/GU      Musculoskeletal   Abdominal   Peds  Hematology   Anesthesia Other Findings   Reproductive/Obstetrics                             Anesthesia Physical  Anesthesia Plan  ASA: III  Anesthesia Plan: General   Post-op Pain Management:    Induction: Intravenous, Rapid sequence and Cricoid pressure planned  PONV Risk Score and Plan: 2 and Ondansetron, Midazolam and Treatment may vary due to age or medical condition  Airway Management Planned: Oral ETT  Additional Equipment:   Intra-op Plan:   Post-operative Plan: Extubation in OR  Informed Consent: I have reviewed the patients History and Physical, chart, labs and discussed the procedure including the risks, benefits and alternatives for the proposed anesthesia with the patient or authorized representative who has indicated his/her understanding and acceptance.     Dental advisory given  Plan Discussed with: CRNA and Surgeon  Anesthesia Plan Comments:         Anesthesia Quick Evaluation

## 2019-12-07 NOTE — Progress Notes (Signed)
Used video interpreter [phone] for pre-op interview. Patient reports having additional questions about surgery and was not sure what they were planning on doing. I advised patient that we would ensure MD spoke to him again about surgery and if he decided that he did not want surgery we would not perform the surgery.

## 2019-12-07 NOTE — Transfer of Care (Signed)
Immediate Anesthesia Transfer of Care Note  Patient: Micheal Watson  Procedure(s) Performed: LAPAROSCOPY DIAGNOSTIC (N/A )  Patient Location: PACU  Anesthesia Type:General  Level of Consciousness: awake, alert , oriented and patient cooperative  Airway & Oxygen Therapy: Patient Spontanous Breathing and Patient connected to nasal cannula oxygen  Post-op Assessment: Report given to RN and Post -op Vital signs reviewed and stable  Post vital signs: Reviewed and stable  Last Vitals:  Vitals Value Taken Time  BP 110/78 12/07/19 1701  Temp    Pulse 78 12/07/19 1703  Resp 17 12/07/19 1703  SpO2 96 % 12/07/19 1703  Vitals shown include unvalidated device data.  Last Pain:  Vitals:   12/07/19 1217  TempSrc: Oral  PainSc:       Patients Stated Pain Goal: 2 ([id number])  Complications: No apparent anesthesia complications

## 2019-12-07 NOTE — Plan of Care (Signed)

## 2019-12-08 LAB — CBC
HCT: 43.2 % (ref 39.0–52.0)
Hemoglobin: 14.2 g/dL (ref 13.0–17.0)
MCH: 33.6 pg (ref 26.0–34.0)
MCHC: 32.9 g/dL (ref 30.0–36.0)
MCV: 102.1 fL — ABNORMAL HIGH (ref 80.0–100.0)
Platelets: 348 10*3/uL (ref 150–400)
RBC: 4.23 MIL/uL (ref 4.22–5.81)
RDW: 12.6 % (ref 11.5–15.5)
WBC: 15 10*3/uL — ABNORMAL HIGH (ref 4.0–10.5)
nRBC: 0 % (ref 0.0–0.2)

## 2019-12-08 LAB — COMPREHENSIVE METABOLIC PANEL
ALT: 32 U/L (ref 0–44)
AST: 50 U/L — ABNORMAL HIGH (ref 15–41)
Albumin: 2.4 g/dL — ABNORMAL LOW (ref 3.5–5.0)
Alkaline Phosphatase: 118 U/L (ref 38–126)
Anion gap: 11 (ref 5–15)
BUN: 6 mg/dL (ref 6–20)
CO2: 24 mmol/L (ref 22–32)
Calcium: 8.4 mg/dL — ABNORMAL LOW (ref 8.9–10.3)
Chloride: 100 mmol/L (ref 98–111)
Creatinine, Ser: 0.78 mg/dL (ref 0.61–1.24)
GFR calc Af Amer: >60 mL/min (ref >60)
GFR calc non Af Amer: >60 mL/min (ref >60)
Glucose, Bld: 165 mg/dL — ABNORMAL HIGH (ref 70–99)
Potassium: 4.2 mmol/L (ref 3.5–5.1)
Sodium: 135 mmol/L (ref 135–145)
Total Bilirubin: 0.8 mg/dL (ref 0.3–1.2)
Total Protein: 6 g/dL — ABNORMAL LOW (ref 6.5–8.1)

## 2019-12-08 MED ORDER — HEPARIN SODIUM (PORCINE) 5000 UNIT/ML IJ SOLN
5000.0000 [IU] | Freq: Three times a day (TID) | INTRAMUSCULAR | Status: DC
  Administered 2019-12-08 – 2019-12-09 (×5): 5000 [IU] via SUBCUTANEOUS
  Filled 2019-12-08 (×5): qty 1

## 2019-12-08 NOTE — Plan of Care (Signed)
  Problem: Education: Goal: Knowledge of General Education information will improve Description: Including pain rating scale, medication(s)/side effects and non-pharmacologic comfort measures Outcome: Progressing   Problem: Health Behavior/Discharge Planning: Goal: Ability to manage health-related needs will improve Outcome: Progressing   Problem: Elimination: Goal: Will not experience complications related to bowel motility Outcome: Progressing   Problem: Pain Managment: Goal: General experience of comfort will improve Outcome: Progressing

## 2019-12-08 NOTE — Anesthesia Postprocedure Evaluation (Signed)
Anesthesia Post Note  Patient: Micheal Watson  Procedure(s) Performed: LAPAROSCOPY DIAGNOSTIC (N/A )     Patient location during evaluation: PACU Anesthesia Type: General Level of consciousness: awake and alert Pain management: pain level controlled Vital Signs Assessment: post-procedure vital signs reviewed and stable Respiratory status: spontaneous breathing, nonlabored ventilation and respiratory function stable Cardiovascular status: blood pressure returned to baseline and stable Postop Assessment: no apparent nausea or vomiting Anesthetic complications: no    Last Vitals:  Vitals:   12/08/19 0753 12/08/19 1333  BP: 119/72 114/64  Pulse: 70 80  Resp: 17 17  Temp: 36.8 C 36.8 C  SpO2: 98% 96%    Last Pain:  Vitals:   12/08/19 1333  TempSrc: Oral  PainSc:    Pain Goal: Patients Stated Pain Goal: 1 (12/07/19 2100)                 Lynda Rainwater

## 2019-12-08 NOTE — Progress Notes (Signed)
1 Day Post-Op  Subjective: CC: Feels uncomfortable this am. Some pain around area of drain. Notes some leakage around drain. 900cc/24 hours. Tolerating cld post op without n/v. Mobilizing.   Objective: Vital signs in last 24 hours: Temp:  [98.2 F (36.8 C)-99.3 F (37.4 C)] 98.3 F (36.8 C) (05/05 0753) Pulse Rate:  [70-84] 70 (05/05 0753) Resp:  [11-17] 17 (05/05 0753) BP: (103-131)/(62-83) 119/72 (05/05 0753) SpO2:  [90 %-98 %] 98 % (05/05 0753) Last BM Date: 12/04/19  Intake/Output from previous day: 05/04 0701 - 05/05 0700 In: 1009.9 [I.V.:710; IV Piggyback:299.9] Out: 4725 [Urine:800; Drains:900; Blood:25] Intake/Output this shift: Total I/O In: 240 [P.O.:240] Out: -   PE: Gen: Awake and alert, NAD Heart: regular Lungs: CTAB Abd: soft, much less distended, no drainage from any of his incisions currently, tender around JP drain site. JP with SS output. 900cc/24 hours.   Lab Results:  Recent Labs    12/07/19 0652 12/08/19 0537  WBC 12.7* 15.0*  HGB 13.8 14.2  HCT 42.1 43.2  PLT 325 348   BMET Recent Labs    12/07/19 0652 12/08/19 0537  NA 136 135  K 4.3 4.2  CL 101 100  CO2 28 24  GLUCOSE 96 165*  BUN 7 6  CREATININE 0.90 0.78  CALCIUM 8.4* 8.4*   PT/INR No results for input(s): LABPROT, INR in the last 72 hours. CMP     Component Value Date/Time   NA 135 12/08/2019 0537   K 4.2 12/08/2019 0537   CL 100 12/08/2019 0537   CO2 24 12/08/2019 0537   GLUCOSE 165 (H) 12/08/2019 0537   BUN 6 12/08/2019 0537   CREATININE 0.78 12/08/2019 0537   CALCIUM 8.4 (L) 12/08/2019 0537   PROT 6.0 (L) 12/08/2019 0537   ALBUMIN 2.4 (L) 12/08/2019 0537   AST 50 (H) 12/08/2019 0537   ALT 32 12/08/2019 0537   ALKPHOS 118 12/08/2019 0537   BILITOT 0.8 12/08/2019 0537   GFRNONAA >60 12/08/2019 0537   GFRAA >60 12/08/2019 0537   Lipase     Component Value Date/Time   LIPASE 66 (H) 11/28/2019 1324       Studies/Results: CT ABDOMEN PELVIS W  CONTRAST  Result Date: 12/06/2019 CLINICAL DATA:  Acute abdominal pain. Umbilical drainage. Postoperative for laparoscopic cholecystectomy. HIDA scan was negative for leak. Prior ERCP with stenting. EXAM: CT ABDOMEN AND PELVIS WITH CONTRAST TECHNIQUE: Multidetector CT imaging of the abdomen and pelvis was performed using the standard protocol following bolus administration of intravenous contrast. CONTRAST:  170mL OMNIPAQUE IOHEXOL 300 MG/ML  SOLN COMPARISON:  12/01/2019 CT scan FINDINGS: Lower chest: Subsegmental atelectasis in the left lower lobe. Mild cardiomegaly. Hepatobiliary: 7 mm hypodense lesion in the right hepatic lobe on image 44/3, stable and technically nonspecific although statistically likely to be benign/incidental. No biliary dilatation. A plastic biliary stent extends in the common bile duct and to the duodenum. Postoperative findings from recent cholecystectomy. Pancreas: Unremarkable Spleen: There bandlike lucency along the periphery of the posterior spleen for example on image 25/3, probably a splenic cleft although a splenic laceration could appear similar. Adrenals/Urinary Tract: Unremarkable Stomach/Bowel: Unremarkable Vascular/Lymphatic: Unremarkable Reproductive: Unremarkable Other: Moderate to large amount of ascites scattered in the abdomen and pelvis. No free intraperitoneal gas. This is a fluid density without enhancing margins or findings of abscess. Musculoskeletal: Old healed left-sided rib fractures. Stable rim sclerotic lesion in the left femoral head is nonspecific and likely incidental. Mild lumbar spondylosis and degenerative disc disease. IMPRESSION:  1. Moderate to large amount of ascites scattered in the abdomen and pelvis. The cause of the ascites is indeterminate. There is no free air or abnormal enhancement along the ascites margins to suggest perforation or active peritonitis, although correlation with physical exam is suggested in this regard. Recent hepatobiliary scan  was negative for bile leak. There is a small linear lucency along the posterior margin of the spleen but this seems more likely to be a cleft than a laceration, particularly in the absence of known trauma in this vicinity. The lack of a dropping hemoglobin argues against a splenic injury is the cause for the ascites. 2. A plastic biliary stent extends in the common bile duct and to the duodenum. No biliary dilatation. 3. Mild cardiomegaly. 4. Subsegmental atelectasis in the left lower lobe. 5. Mild lumbar spondylosis and degenerative disc disease. Electronically Signed   By: Van Clines M.D.   On: 12/06/2019 16:40    Anti-infectives: Anti-infectives (From admission, onward)   Start     Dose/Rate Route Frequency Ordered Stop   12/03/19 1400  piperacillin-tazobactam (ZOSYN) IVPB 3.375 g  Status:  Discontinued     3.375 g 100 mL/hr over 30 Minutes Intravenous Every 8 hours 12/03/19 1050 12/03/19 1104   12/03/19 1400  piperacillin-tazobactam (ZOSYN) IVPB 3.375 g     3.375 g 12.5 mL/hr over 240 Minutes Intravenous Every 8 hours 12/03/19 1104     11/28/19 1745  ceFEPIme (MAXIPIME) 2 g in sodium chloride 0.9 % 100 mL IVPB  Status:  Discontinued     2 g 200 mL/hr over 30 Minutes Intravenous Every 8 hours 11/28/19 1705 12/03/19 1050   11/28/19 1715  metroNIDAZOLE (FLAGYL) IVPB 500 mg     500 mg 100 mL/hr over 60 Minutes Intravenous  Once 11/28/19 1705 11/28/19 2130       Assessment/Plan Hx of Acute Cholecystitis with hydrops - S/p Lap Chole, 4/19, Dr. Grandville Silos, POD #16 CBD stricture -s/p ERCP with stenting 4/26 Biliary Ascites - S/p dx laparoscopy, evacuation of bilious ascites 3L, drain placement - Dr. Donne Hazel - 12/07/2019 - POD #1 - Maintain drain - Adv diet - D/c abx - Mobilize, IS  FEN - Reg VTE -SCDs, subq heparin ID: [id number] - 5/5 Follow-Up: Dr. Grandville Silos and GI   LOS: 10 days    Jillyn Ledger , [facility] 12/08/2019, 9:29 AM Please see Amion  for pager number during day hours 7:00am-4:30pm

## 2019-12-08 NOTE — Progress Notes (Signed)
Patient Micheal Watson was emptied like 11 times every hour  throughout the night and it was always full,( see flowsheets for output doc). Nurse noted draining coming out of the site, dressing reinforced with Tegaderm and gauze, but still noted drainage around the Micheal site. Will update the incoming nurse and continue to monitor.

## 2019-12-08 NOTE — Plan of Care (Signed)

## 2019-12-09 MED ORDER — OXYCODONE HCL 5 MG PO TABS: 5.0000 mg | ORAL_TABLET | Freq: Four times a day (QID) | ORAL | 0 refills | Status: DC | PRN

## 2019-12-09 NOTE — TOC Initial Note (Signed)
Transition of Care [facility]) - Initial/Assessment Note    Patient Details  Name: Micheal Watson MRN: [id number] Date of Birth: 16-Jun-1961  Transition of Care [facility]) CM/SW Contact:    Curlene Labrum, RN Phone Number: 12/09/2019, 3:42 PM  Clinical Narrative:                 Case management met with the patient and primary nurse prior to discharge.  Called community health and wellness to schedule followup hospital appointment.  The patient is calling a friends for transportation home.  No other TOC needs at this time.  Expected Discharge Plan: Home/Self Care Barriers to Discharge: Transportation(Patient is calling friend to drive him home)   Patient Goals and CMS Choice Patient states their goals for this hospitalization and ongoing recovery are:: to return home today CMS Medicare.gov Compare Post Acute Care list provided to:: Patient Choice offered to / list presented to : Patient  Expected Discharge Plan and Services Expected Discharge Plan: Home/Self Care   Discharge Planning Services: CM Consult, [facility], Follow-up appt scheduled   Living arrangements for the past 2 months: Single Family Home(lives with his boss) Expected Discharge Date: 12/09/19                         HH Arranged: NA [place] Agency: NA        Prior Living Arrangements/Services Living arrangements for the past 2 months: Single Family Home(lives with his boss) Lives with:: Friends Patient language and need for interpreter reviewed:: Yes Do you feel safe going back to the place where you live?: Yes      Need for Family Participation in Patient Care: Yes (Comment) Care giver support system in place?: Yes (comment)   Criminal Activity/Legal Involvement Pertinent to Current Situation/Hospitalization: No - Comment as needed  Activities of Daily Living Home Assistive Devices/Equipment: None ADL Screening (condition at time of admission) Patient's cognitive ability adequate to  safely complete daily activities?: Yes Is the patient deaf or have difficulty hearing?: No Does the patient have difficulty seeing, even when wearing glasses/contacts?: No Does the patient have difficulty concentrating, remembering, or making decisions?: No Patient able to express need for assistance with ADLs?: Yes Does the patient have difficulty dressing or bathing?: No Independently performs ADLs?: Yes (appropriate for developmental age) Does the patient have difficulty walking or climbing stairs?: No Weakness of Legs: Both Weakness of Arms/Hands: None  Permission Sought/Granted Permission sought to share information with : Case Manager Permission granted to share information with : Yes, Verbal Permission Granted     Permission granted to share info w AGENCY: community health and wellness        Emotional Assessment Appearance:: Appears stated age Attitude/Demeanor/Rapport: Gracious Affect (typically observed): Accepting Orientation: : Oriented to Self, Oriented to Place, Oriented to  Time, Oriented to Situation Alcohol / Substance Use: Not Applicable Psych Involvement: No (comment)  Admission diagnosis:  Cholangitis [K83.09] Bile leak, postoperative [K91.89, K83.8] Patient Active Problem List   Diagnosis Date Noted  . Bile leak, postoperative 11/28/2019  . Acute cholecystitis without calculus 11/21/2019  . Protein-calorie malnutrition ([place]) 04/11/2019  . Hyperammonemia ([place]) 04/09/2019  . Elevated LFTs   . Gynecomastia, male   . Hyperbilirubinemia   . Tremor of unknown origin   . COVID-19 virus infection 11/27/2018  . Acute respiratory disease due to COVID-19 virus 11/27/2018  . SIRS (systemic inflammatory response syndrome) ([place]) 11/25/2018  . Acute on chronic respiratory failure with hypoxia (HCC)  11/21/2018  . Suspected COVID-19 virus infection 11/17/2018   PCP:  Patient, No Pcp Per Pharmacy:   [facility] [place], [place] [address] Phone: [phone] Fax: [phone]  [facility] [place], [place] [address] [place] [address] Phone: [phone] Fax: [phone]     Social Determinants of Health (SDOH) Interventions    Readmission Risk Interventions Readmission Risk Prevention Plan 12/09/2019  Post Dischage Appt Complete  Medication Screening Complete  Transportation Screening Complete

## 2019-12-09 NOTE — Discharge Summary (Signed)
Patient ID: Micheal Watson [id number] June 08, 1961 59 y.o.  Admit date: 11/28/2019 Discharge date: 12/09/2019  Admitting Diagnosis: S/p lap chole with possible bile leak  Discharge Diagnosis Patient Active Problem List   Diagnosis Date Noted  .    Marland Kitchen Acute cholecystitis without calculus 11/21/2019  . Protein-calorie malnutrition ([place]) 04/11/2019  . Hyperammonemia ([place]) 04/09/2019  . Elevated LFTs   . Gynecomastia, male   . Hyperbilirubinemia   . Tremor of unknown origin   . COVID-19 virus infection 11/27/2018  . Acute respiratory disease due to COVID-19 virus 11/27/2018  . SIRS (systemic inflammatory response syndrome) ([place]) 11/25/2018  . Acute on chronic respiratory failure with hypoxia ([place]) 11/21/2018  . Suspected COVID-19 virus infection 11/17/2018  no post op bile leak Biliary ascites CBD stricture, s/p ERCP and stenting  Consultants Dr. Watt Climes, GI  Reason for Admission: Patient is a 59 year old male postop day 6 from lap chole by Dr. Grandville Silos.  Patient states that he has had 4 days of abdominal pain, umbilical bile leakage, and dark urine.  Patient came today to the ER secondary to continued abdominal pain.  Patient denies any nausea or vomiting.  Patient states he has been having normal bowel movements and tolerating p.o. well.  Patient states that he has been having significant drainage from his umbilical port site.  The drainage is soaked up several T-shirts on an overnight basis.  Upon evaluation the ER he underwent CT scan.  CT scan was significant for umbilical hernia, mild enhancement of the common bile duct and cystic duct remnant walls to the cholecystectomy site.  There was no overt fluid collection intra-abdominally.  I did review the CT scan personally.  Patient had a leukocytosis.  Patient's T bili was 7.9.  Patient also with elevated transaminases.  Procedures dx laparoscopy, evacuation of bilious ascites 3L, drain placement, Dr. Donne Hazel  [facility] Course:  The patient was admitted and GI was consulted for possible bile and need for ERCP.  He underwent this and a CBD stricture was found and a stent was placed, but no evidence of a bile leak.  A HIDA scan was also negative for a bile leak.  Despite this, the patient had what was felt to be bilious drainage from his umbilical port site.  The patient continued to have drainage and pain and was kept on abx therapy.  Ultimately, his drainage stopped, but pain and slightly elevated WBC persisted.  Repeat CT continued to reveal moderate to large amount of ascites.  He was taken to the OR for a dx laparoscopy to rule out bile leak, bowel injury, etc.  No evidence of the aforementioned was found, but 3L of bilious ascites was removed.  A JP drain was placed and this transitioned to serous output around 500cc at time of discharge.  On POD 2, he was improving, eating, and stable for DC home.  Physical Exam: See progress note from earlier today  Allergies as of 12/09/2019      Reactions   Pork-derived Products Other (See Comments)   "I have pain in my stomach if I eat this"      Medication List    TAKE these medications   acetaminophen 325 MG tablet Commonly known as: TYLENOL Take 2 tablets (650 mg total) by mouth every 6 (six) hours as needed for mild pain or moderate pain.   amLODipine 10 MG tablet Commonly known as: NORVASC TAKE 1 TABLET BY MOUTH EVERY DAY   atorvastatin 40 MG tablet  Commonly known as: LIPITOR TAKE 1 TABLET (40 MG TOTAL) BY MOUTH DAILY AT 6 PM.   docusate sodium 100 MG capsule Commonly known as: Colace Take 1 capsule (100 mg total) by mouth 2 (two) times daily as needed.   ondansetron 4 MG disintegrating tablet Commonly known as: ZOFRAN-ODT Take 1 tablet (4 mg total) by mouth every 6 (six) hours as needed for nausea.   oxyCODONE 5 MG immediate release tablet Commonly known as: Oxy IR/ROXICODONE Take 1 tablet (5 mg total) by mouth every 6 (six) hours as  needed for moderate pain or severe pain (5mg  for moderate pain, 10mg  for severe pain). What changed: reasons to take this   polyethylene glycol 17 g packet Commonly known as: MIRALAX / GLYCOLAX Take 17 g by mouth daily as needed.        Follow-up Information    Georganna Skeans, MD. Go on 12/15/2019.   Specialty: General Surgery Why: @ 920am. por favor llegue a su cita 30 minutos antes para el papeleo. Sheela Stack una copia de su identificacin con foto y tarjeta de seguro a la cita. Contact information: [place] [address] [phone]        Clarene Essex, MD. Schedule an appointment as soon as possible for a visit in 4 week(s).   Specialty: Gastroenterology Contact information: [id number] [place] [address] [phone]           Signed: Saverio Danker, [facility] 12/09/2019, 3:17 PM Please see Amion for pager number during day hours 7:00am-4:30pm, 7-11:30am on Weekends

## 2019-12-09 NOTE — Plan of Care (Signed)

## 2019-12-09 NOTE — Progress Notes (Signed)
Patient is still having drainage around the site where the JP drain is inserted, transparent dressing reinforced with ABD and tape to prevent draining on his body directly, output not much as compared to yesterday( see flow sheets data) pain med given, will continue to monitor.

## 2019-12-09 NOTE — Discharge Instructions (Signed)
Colangitis Cholangitis  La colangitis es la inflamacin del grupo de conductos (vas) que transportan los jugos digestivos desde el hgado, la vescula biliar y el pncreas al intestino delgado. A este grupo de conductos se lo conoce como vas biliares. La colangitis puede causar fiebre, dolor abdominal y Architectural technologist de la piel, la parte blanca de los ojos y las membranas mucosas (ictericia). Puede empeorar rpidamente y causar infeccin en todo el organismo (sepsis). Es importante diagnosticar y tratar la colangitis lo antes posible. Cules son las causas? Generalmente, la causa de este cuadro clnico es un bloqueo (obstruccin) en las vas biliares. Las causas ms frecuentes de la obstruccin son las siguientes:  Formacin de partculas duras (clculos) en las vas biliares.  Daos a las vas biliares debido a un procedimiento quirrgico o diagnstico previo. Otras causas de una obstruccin incluyen las siguientes:  Quistes o tumores en las vas biliares.  Un tipo de enfermedad heptica que afecta las vas biliares (colangitis esclerosante primaria).  Nacer con vas biliares estrechas. Cuando el flujo de los jugos digestivos se obstruye, las bacterias que normalmente viven en el intestino pueden crecer y diseminarse dentro de las vas biliares. Qu incrementa el riesgo? Los siguientes factores pueden hacer que sea ms propenso a contraer esta afeccin:  Tener entre 23 y 26aos.  Tener antecedentes de clculos en las vas biliares.  Haber tenido colangitis en el pasado.  [place] Mode.  Tener otra enfermedad que afecta las vas biliares.  Haberse sometido a un procedimiento para diagnosticar o tratar problemas de las vas biliares, en especial, colangiopancreatografa retrgrada por va endoscpica (CPRE). Estos tipos de procedimientos pueden derivar en la formacin de cicatrices y causar obstrucciones, lo que puede llevar a infecciones. Cules son los signos o los  sntomas? Los sntomas ms frecuentes de este cuadro clnico son Cristy Hilts, dolor abdominal e ictericia. Con frecuencia, todos estos sntomas estn presentes. Otros sntomas pueden incluir:  Escalofros.  Cansancio.  Nuseas.  Orina de color oscuro.  Heces de color arcilla.  Confusin.  Picazn en la piel. Cmo se diagnostica? Esta afeccin se puede diagnosticar en funcin de lo siguiente:  Sus sntomas.  Un examen fsico.  Sus antecedentes mdicos. El mdico puede preguntarle si tuvo clculos o si le realizaron una CPRE u otros procedimientos con compromiso de las vas biliares en el pasado.  [place] de [place].  Estudios de diagnstico por imgenes, por ejemplo: ? Earl Lagos. Este estudio emplea ondas de sonido para crear una imagen de las obstrucciones que tiene. ? Una resonancia magntica (RM). ? Una exploracin por tomografa computarizada (TC).  CPRE para examinar las vas biliares en busca de las causas posibles de la colangitis. [employer], se inserta un tubo delgado que emite luz (endoscopio) por la boca hasta la garganta y se lo introduce hasta la primera porcin del intestino delgado (duodeno). Un pequeo tubo plstico (cnula) se pasa luego a travs del endoscopio y se dirige a las vas biliares o pancreticas. Luego se inyecta una sustancia de contraste a travs de la cnula y se toman radiografas. Cmo se trata? Este cuadro clnico suele tratarse en un hospital. El tratamiento puede incluir:  Administracin de lquidos, nutricin y antibiticos a travs de una va intravenosa. Pueden administrarle antibiticos que destruirn la mayora de las bacterias que se sabe causan colangitis (antibiticos de amplio espectro).  CPRE u otro procedimiento quirrgico para abrir y Chief Financial Officer las vas biliares. Siga estas instrucciones en su casa: [employer] de venta [place]  y los recetados solamente como se lo haya indicado el [place]  su antibitico como se lo haya indicado el mdico. No deje de tomar los antibiticos aunque comience a sentirse mejor. Instrucciones generales  Siga las instrucciones del mdico respecto de las restricciones en las comidas o las bebidas.  Mantenga un peso saludable.  Concurra a todas las visitas de seguimiento como se lo haya indicado el mdico. Esto es importante. Actividad  Realice ejercicio con regularidad como se lo haya indicado el mdico.  Retome sus actividades normales como se lo haya indicado el mdico. Pregntele al mdico qu actividades son seguras para usted. Comunquese con un mdico si:  Los sntomas regresan o se intensifican.  [place] repentinamente. Solicite ayuda inmediatamente si:  Tiene fiebre.  Tiene escalofros.  Siente un dolor abdominal intenso.  Sentirse mareado o aturdido. Resumen  La colangitis es la inflamacin del grupo de conductos (vas) que transportan los jugos digestivos desde el hgado, la vescula biliar y el pncreas al intestino delgado.  Generalmente, la causa de este cuadro clnico es un bloqueo (obstruccin) en las vas biliares.  Los sntomas ms frecuentes de este cuadro clnico son Cristy Hilts, dolor abdominal e ictericia.  Este cuadro clnico suele tratarse en un hospital. Esta informacin no tiene Marine scientist el consejo del mdico. Asegrese de hacerle al mdico cualquier pregunta que tenga. Document Revised: 04/14/2018 Document Reviewed: 04/14/2018 Elsevier Patient Education  [place] this sheet to all of your post-operative appointments while you have your drains.  Please measure your drains by CC's or ML's.  Make sure you drain and measure your JP Drains 3 times per day.  At the end of each day, add up totals for the left side and add up totals for the right side.    ( 9 am )     ( 3 pm )        ( 9 pm )                Date L  R  L  R  L  R  Total L/R

## 2019-12-09 NOTE — Progress Notes (Signed)
2 Days Post-Op  Subjective: CC: Abdominal pain Interpreter used for translation.  Patient reports that he increased epigastric abdominal pain yesterday afternoon.  He did require 2 doses of IV pain medication yesterday.  None today.  He is very concerned about drainage around surgical drain and going home with this.  He is tolerating a diet without any nausea or vomiting.  Off O2.  Ambulating without difficulty.  Voiding without difficulty.  Objective: Vital signs in last 24 hours: Temp:  [98 F (36.7 C)-98.3 F (36.8 C)] 98.3 F (36.8 C) (05/06 0451) Pulse Rate:  [66-80] 66 (05/06 0451) Resp:  [14-18] 17 (05/06 0451) BP: (114-149)/(64-92) 149/92 (05/06 0451) SpO2:  [94 %-96 %] 95 % (05/06 0451) Last BM Date: 12/08/19  Intake/Output from previous day: 05/05 0701 - 05/06 0700 In: 1305 [P.O.:480; I.V.:825] Out: 2115 [Urine:1600; Drains:515] Intake/Output this shift: Total I/O In: 360 [P.O.:360] Out: 300 [Urine:300]  PE: Gen: Awake and alert, NAD Heart: regular Lungs: CTAB Abd: soft, much less distended, no drainage from any of his incisions currently, epigastric tenderness without rigidity. JP with SS output. 515cc/24 hours.  Msk: No edema, 2+ DP  Lab Results:  Recent Labs    12/07/19 0652 12/08/19 0537  WBC 12.7* 15.0*  HGB 13.8 14.2  HCT 42.1 43.2  PLT 325 348   BMET Recent Labs    12/07/19 0652 12/08/19 0537  NA 136 135  K 4.3 4.2  CL 101 100  CO2 28 24  GLUCOSE 96 165*  BUN 7 6  CREATININE 0.90 0.78  CALCIUM 8.4* 8.4*   PT/INR No results for input(s): LABPROT, INR in the last 72 hours. CMP     Component Value Date/Time   NA 135 12/08/2019 0537   K 4.2 12/08/2019 0537   CL 100 12/08/2019 0537   CO2 24 12/08/2019 0537   GLUCOSE 165 (H) 12/08/2019 0537   BUN 6 12/08/2019 0537   CREATININE 0.78 12/08/2019 0537   CALCIUM 8.4 (L) 12/08/2019 0537   PROT 6.0 (L) 12/08/2019 0537   ALBUMIN 2.4 (L) 12/08/2019 0537   AST 50 (H) 12/08/2019 0537   ALT 32 12/08/2019 0537   ALKPHOS 118 12/08/2019 0537   BILITOT 0.8 12/08/2019 0537   GFRNONAA >60 12/08/2019 0537   GFRAA >60 12/08/2019 0537   Lipase     Component Value Date/Time   LIPASE 66 (H) 11/28/2019 1324       Studies/Results: No results found.  Anti-infectives: Anti-infectives (From admission, onward)   Start     Dose/Rate Route Frequency Ordered Stop   12/03/19 1400  piperacillin-tazobactam (ZOSYN) IVPB 3.375 g  Status:  Discontinued     3.375 g 100 mL/hr over 30 Minutes Intravenous Every 8 hours 12/03/19 1050 12/03/19 1104   12/03/19 1400  piperacillin-tazobactam (ZOSYN) IVPB 3.375 g  Status:  Discontinued     3.375 g 12.5 mL/hr over 240 Minutes Intravenous Every 8 hours 12/03/19 1104 12/08/19 0935   11/28/19 1745  ceFEPIme (MAXIPIME) 2 g in sodium chloride 0.9 % 100 mL IVPB  Status:  Discontinued     2 g 200 mL/hr over 30 Minutes Intravenous Every 8 hours 11/28/19 1705 12/03/19 1050   11/28/19 1715  metroNIDAZOLE (FLAGYL) IVPB 500 mg     500 mg 100 mL/hr over 60 Minutes Intravenous  Once 11/28/19 1705 11/28/19 2130       Assessment/Plan Hx of Acute Cholecystitis with hydrops - S/p Lap Chole, 4/19, Dr. Grandville Silos, POD #17 CBD stricture -s/p ERCP  with stenting 4/26 Biliary Ascites - S/p dx laparoscopy, evacuation of bilious ascites 3L, drain placement - Dr. Donne Hazel - 12/07/2019 - POD #2 - Maintain drain - recheck this PM for possible d/c if pain controlled with oral medications  - Mobilize, IS  FEN -Reg VTE -SCDs, subq heparin ID: [id number] - 5/5 Follow-Up: Dr. Grandville Silos and GI. Will need nurse visit for staples and drain check.    LOS: 11 days    Jillyn Ledger , [facility] 12/09/2019, 10:33 AM Please see Amion for pager number during day hours 7:00am-4:30pm

## 2019-12-14 ENCOUNTER — Ambulatory Visit: Payer: Self-pay | Attending: Family Medicine | Admitting: Family Medicine

## 2019-12-14 ENCOUNTER — Other Ambulatory Visit: Payer: Self-pay

## 2019-12-14 DIAGNOSIS — I10 Essential (primary) hypertension: Secondary | ICD-10-CM

## 2019-12-14 DIAGNOSIS — K810 Acute cholecystitis: Secondary | ICD-10-CM

## 2019-12-14 NOTE — Progress Notes (Signed)
Having drainage at surgery site.

## 2019-12-14 NOTE — Progress Notes (Signed)
Virtual Visit via Telephone Note  I connected with Micheal Watson, on 12/14/2019 at 2:48 PM by telephone due to the COVID-19 pandemic and verified that I am speaking with the correct person using two identifiers.   Consent: I discussed the limitations, risks, security and privacy concerns of performing an evaluation and management service by telephone and the availability of in person appointments. I also discussed with the patient that there may be a patient responsible charge related to this service. The patient expressed understanding and agreed to proceed.   Location of Patient: Home  Location of Provider: Clinic   Persons participating in Telemedicine visit: [facility] Patty Sermons ID# [id number] Dr. Margarita Rana     History of Present Illness: He is a 59 year old male with a history of Hypertension, s/p lap chole who was readmitted for possible bile leak and ERCP from 4/18-4/20/21. HIDA scan revealed negative bile leak but presence of CBD stricture for which stent was placed, treated with antibiotics during his stay. CT abdomen revealed moderate to large ascites, bowel injury, bile leak ruled out and 3L bilious ascites was drained. A JP drain was placed and he was subsequently discharged.  He complains of a leak from the surgical site and his umbilicus. Pain in abdomen is rated as mild His appointment with his surgeon comes up tomorrow. Appetite is good and he moves his bowels ok. Denies presence of fever  He is good on his Amlodipine and does not need refills. Past Medical History:  Diagnosis Date  . Hepatitis   . Hyperlipidemia   . Hypertension   . Pneumonia   . Pre-diabetes    Allergies  Allergen Reactions  . Pork-Derived Products Other (See Comments)    "I have pain in my stomach if I eat this"    Current Outpatient Medications on File Prior to Visit  Medication Sig Dispense Refill  . amLODipine (NORVASC) 10 MG tablet TAKE 1 TABLET BY  MOUTH EVERY DAY 30 tablet 11  . oxyCODONE (OXY IR/ROXICODONE) 5 MG immediate release tablet Take 1 tablet (5 mg total) by mouth every 6 (six) hours as needed for moderate pain or severe pain (5mg  for moderate pain, 10mg  for severe pain). 15 tablet 0  . acetaminophen (TYLENOL) 325 MG tablet Take 2 tablets (650 mg total) by mouth every 6 (six) hours as needed for mild pain or moderate pain. (Patient not taking: Reported on 12/14/2019)    . atorvastatin (LIPITOR) 40 MG tablet TAKE 1 TABLET (40 MG TOTAL) BY MOUTH DAILY AT 6 PM. (Patient not taking: Reported on 11/22/2019) 30 tablet 0  . docusate sodium (COLACE) 100 MG capsule Take 1 capsule (100 mg total) by mouth 2 (two) times daily as needed. (Patient not taking: Reported on 12/14/2019) 30 capsule 2  . ondansetron (ZOFRAN-ODT) 4 MG disintegrating tablet Take 1 tablet (4 mg total) by mouth every 6 (six) hours as needed for nausea. (Patient not taking: Reported on 12/14/2019) 20 tablet 0  . polyethylene glycol (MIRALAX / GLYCOLAX) 17 g packet Take 17 g by mouth daily as needed. (Patient not taking: Reported on 12/14/2019) 14 each 0   No current facility-administered medications on file prior to visit.    Observations/Objective: Alert, awake, oriented x3 Not in acute distress  Assessment and Plan: 1. Acute cholecystitis without calculus S/p lap chole complicated by bilious ascites JP drain in place Stable Advised to keep appointment with General Surgeon tomorrow  2. Essential hypertension Controlled on Amlodipine Counseled on blood pressure goal  of less than 130/80, low-sodium, DASH diet, medication compliance, 150 minutes of moderate intensity exercise per week. Discussed medication compliance, adverse effects.     Follow Up Instructions: 6 weeks for chronic disease management.   I discussed the assessment and treatment plan with the patient. The patient was provided an opportunity to ask questions and all were answered. The patient agreed  with the plan and demonstrated an understanding of the instructions.   The patient was advised to call back or seek an in-person evaluation if the symptoms worsen or if the condition fails to improve as anticipated.     I provided 20 minutes total of non-face-to-face time during this encounter including median intraservice time, reviewing previous notes, investigations, ordering medications, medical decision making, coordinating care and patient verbalized understanding at the end of the visit.     Charlott Rakes, MD, FAAFP. [facility] and [place], [place]   12/14/2019, 2:48 PM

## 2019-12-15 DIAGNOSIS — I10 Essential (primary) hypertension: Secondary | ICD-10-CM | POA: Insufficient documentation

## 2019-12-23 ENCOUNTER — Emergency Department (HOSPITAL_COMMUNITY)
Admission: EM | Admit: 2019-12-23 | Discharge: 2019-12-23 | Disposition: A | Discharge status: Home / Self Care | Payer: Self-pay

## 2019-12-24 ENCOUNTER — Emergency Department (HOSPITAL_COMMUNITY): Payer: Self-pay

## 2019-12-24 ENCOUNTER — Observation Stay (HOSPITAL_COMMUNITY)
Admission: EM | Admit: 2019-12-24 | Discharge: 2019-12-25 | Disposition: A | Discharge status: Home / Self Care | Payer: Self-pay | Attending: Internal Medicine | Admitting: Internal Medicine

## 2019-12-24 ENCOUNTER — Encounter (HOSPITAL_COMMUNITY): Payer: Self-pay | Admitting: Emergency Medicine

## 2019-12-24 DIAGNOSIS — G8918 Other acute postprocedural pain: Principal | ICD-10-CM | POA: Insufficient documentation

## 2019-12-24 DIAGNOSIS — Z79899 Other long term (current) drug therapy: Secondary | ICD-10-CM | POA: Insufficient documentation

## 2019-12-24 DIAGNOSIS — Z20822 Contact with and (suspected) exposure to covid-19: Secondary | ICD-10-CM | POA: Insufficient documentation

## 2019-12-24 DIAGNOSIS — E46 Unspecified protein-calorie malnutrition: Secondary | ICD-10-CM | POA: Insufficient documentation

## 2019-12-24 DIAGNOSIS — M47816 Spondylosis without myelopathy or radiculopathy, lumbar region: Secondary | ICD-10-CM | POA: Insufficient documentation

## 2019-12-24 DIAGNOSIS — K5730 Diverticulosis of large intestine without perforation or abscess without bleeding: Secondary | ICD-10-CM | POA: Insufficient documentation

## 2019-12-24 DIAGNOSIS — Z9049 Acquired absence of other specified parts of digestive tract: Secondary | ICD-10-CM | POA: Insufficient documentation

## 2019-12-24 DIAGNOSIS — N400 Enlarged prostate without lower urinary tract symptoms: Secondary | ICD-10-CM | POA: Insufficient documentation

## 2019-12-24 DIAGNOSIS — Z87891 Personal history of nicotine dependence: Secondary | ICD-10-CM | POA: Insufficient documentation

## 2019-12-24 DIAGNOSIS — R7303 Prediabetes: Secondary | ICD-10-CM | POA: Insufficient documentation

## 2019-12-24 DIAGNOSIS — E785 Hyperlipidemia, unspecified: Secondary | ICD-10-CM | POA: Insufficient documentation

## 2019-12-24 DIAGNOSIS — R140 Abdominal distension (gaseous): Secondary | ICD-10-CM | POA: Insufficient documentation

## 2019-12-24 DIAGNOSIS — R109 Unspecified abdominal pain: Secondary | ICD-10-CM | POA: Diagnosis present

## 2019-12-24 DIAGNOSIS — I10 Essential (primary) hypertension: Secondary | ICD-10-CM | POA: Insufficient documentation

## 2019-12-24 DIAGNOSIS — K759 Inflammatory liver disease, unspecified: Secondary | ICD-10-CM | POA: Insufficient documentation

## 2019-12-24 LAB — CBC WITH DIFFERENTIAL/PLATELET
Abs Immature Granulocytes: 0.04 10*3/uL (ref 0.00–0.07)
Basophils Absolute: 0.1 10*3/uL (ref 0.0–0.1)
Basophils Relative: 1 %
Eosinophils Absolute: 0.1 10*3/uL (ref 0.0–0.5)
Eosinophils Relative: 1 %
HCT: 45.4 % (ref 39.0–52.0)
Hemoglobin: 14.9 g/dL (ref 13.0–17.0)
Immature Granulocytes: 0 %
Lymphocytes Relative: 16 %
Lymphs Abs: 1.7 10*3/uL (ref 0.7–4.0)
MCH: 32.8 pg (ref 26.0–34.0)
MCHC: 32.8 g/dL (ref 30.0–36.0)
MCV: 100 fL (ref 80.0–100.0)
Monocytes Absolute: 0.8 10*3/uL (ref 0.1–1.0)
Monocytes Relative: 8 %
Neutro Abs: 7.9 10*3/uL — ABNORMAL HIGH (ref 1.7–7.7)
Neutrophils Relative %: 74 %
Platelets: 252 10*3/uL (ref 150–400)
RBC: 4.54 MIL/uL (ref 4.22–5.81)
RDW: 12.7 % (ref 11.5–15.5)
WBC: 10.6 10*3/uL — ABNORMAL HIGH (ref 4.0–10.5)
nRBC: 0 % (ref 0.0–0.2)

## 2019-12-24 LAB — COMPREHENSIVE METABOLIC PANEL
ALT: 22 U/L (ref 0–44)
AST: 42 U/L — ABNORMAL HIGH (ref 15–41)
Albumin: 2.4 g/dL — ABNORMAL LOW (ref 3.5–5.0)
Alkaline Phosphatase: 105 U/L (ref 38–126)
Anion gap: 11 (ref 5–15)
BUN: 12 mg/dL (ref 6–20)
CO2: 22 mmol/L (ref 22–32)
Calcium: 8.5 mg/dL — ABNORMAL LOW (ref 8.9–10.3)
Chloride: 102 mmol/L (ref 98–111)
Creatinine, Ser: 0.94 mg/dL (ref 0.61–1.24)
GFR calc Af Amer: >60 mL/min (ref >60)
GFR calc non Af Amer: >60 mL/min (ref >60)
Glucose, Bld: 103 mg/dL — ABNORMAL HIGH (ref 70–99)
Potassium: 4.3 mmol/L (ref 3.5–5.1)
Sodium: 135 mmol/L (ref 135–145)
Total Bilirubin: 2.3 mg/dL — ABNORMAL HIGH (ref 0.3–1.2)
Total Protein: 6 g/dL — ABNORMAL LOW (ref 6.5–8.1)

## 2019-12-24 LAB — SARS CORONAVIRUS 2 BY RT PCR (HOSPITAL ORDER, PERFORMED IN ~~LOC~~ HOSPITAL LAB): SARS Coronavirus 2: NEGATIVE

## 2019-12-24 LAB — PROTIME-INR
INR: 1.2 (ref 0.8–1.2)
Prothrombin Time: 14.4 seconds (ref 11.4–15.2)

## 2019-12-24 LAB — LIPASE, BLOOD: Lipase: 30 U/L (ref 11–51)

## 2019-12-24 LAB — ETHANOL: Alcohol, Ethyl (B): <10 mg/dL (ref <10)

## 2019-12-24 MED ORDER — HYDROMORPHONE HCL 1 MG/ML IJ SOLN: 0.5000 mg | INTRAMUSCULAR | Status: DC | PRN

## 2019-12-24 MED ORDER — OXYCODONE HCL 5 MG PO TABS: 5.0000 mg | ORAL_TABLET | ORAL | Status: DC | PRN

## 2019-12-24 MED ORDER — IOHEXOL 300 MG/ML  SOLN
100.0000 mL | Freq: Once | INTRAMUSCULAR | Status: AC | PRN
  Administered 2019-12-24: 100 mL via INTRAVENOUS

## 2019-12-24 MED ORDER — OXYCODONE HCL 5 MG PO TABS: 5.0000 mg | ORAL_TABLET | Freq: Four times a day (QID) | ORAL | Status: DC | PRN

## 2019-12-24 NOTE — Progress Notes (Signed)
Micheal Watson is a 59 y.o. male patient admitted from ED awake, alert - oriented  X 4 - no acute distress noted.  VSS - Blood pressure 112/76, pulse 73, temperature 99 F (37.2 C), temperature source Oral, resp. rate 20, height 5\' 9"  (1.753 m), weight 88.2 kg, SpO2 98 %.    IV in place, occlusive dsg intact without redness.    Will cont to eval and treat per MD orders.  Vidal Schwalbe, RN 12/24/2019 2100

## 2019-12-24 NOTE — ED Provider Notes (Signed)
[facility] EMERGENCY DEPARTMENT Provider Note   CSN: [id number] Arrival date & time: 12/24/19  [id number]     History Chief Complaint  Patient presents with  . Post-op Problem    Micheal Watson is a 59 y.o. male.  Patient presenting with a complaint of pain in the right upper quadrant.  He had a drain removed just the other day.  Patient followed by [facility] general surgery.  On April 19 he had gallbladder removal for cholecystitis with hydrops.  Then he was reoperated on laparoscopically on May 4 for a bile leak and biliary ascites.  Had about 3 L of ascites.  A drain was placed at that time and was just removed recently.  Patient denies any nausea or vomiting.        Past Medical History:  Diagnosis Date  . Hepatitis   . Hyperlipidemia   . Hypertension   . Pneumonia   . Pre-diabetes     Patient Active Problem List   Diagnosis Date Noted  . Essential hypertension 12/15/2019  . Bile leak, postoperative 11/28/2019  . Acute cholecystitis without calculus 11/21/2019  . Protein-calorie malnutrition ([place]) 04/11/2019  . Hyperammonemia ([place]) 04/09/2019  . Elevated LFTs   . Gynecomastia, male   . Hyperbilirubinemia   . Tremor of unknown origin   . COVID-19 virus infection 11/27/2018  . Acute respiratory disease due to COVID-19 virus 11/27/2018  . SIRS (systemic inflammatory response syndrome) ([place]) 11/25/2018  . Acute on chronic respiratory failure with hypoxia ([place]) 11/21/2018  . Suspected COVID-19 virus infection 11/17/2018    Past Surgical History:  Procedure Laterality Date  . BILIARY BRUSHING  11/29/2019   Procedure: BILIARY BRUSHING;  Surgeon: Clarene Essex, MD;  Location: [facility] ENDOSCOPY;  Service: Endoscopy;;  . BILIARY DILATION  11/29/2019   Procedure: BILIARY DILATION;  Surgeon: Clarene Essex, MD;  Location: [facility] ENDOSCOPY;  Service: Endoscopy;;  . BILIARY STENT PLACEMENT  11/29/2019   Procedure: BILIARY STENT PLACEMENT;  Surgeon: Clarene Essex, MD;  Location: [place];  Service: Endoscopy;;  . CHOLECYSTECTOMY N/A 11/22/2019   Procedure: LAPAROSCOPIC CHOLECYSTECTOMY;  Surgeon: Georganna Skeans, MD;  Location: [place];  Service: General;  Laterality: N/A;  . ERCP N/A 11/29/2019   Procedure: ENDOSCOPIC RETROGRADE CHOLANGIOPANCREATOGRAPHY (ERCP);  Surgeon: Clarene Essex, MD;  Location: [place];  Service: Endoscopy;  Laterality: N/A;  . LAPAROSCOPY N/A 12/07/2019   Procedure: LAPAROSCOPY DIAGNOSTIC;  Surgeon: Rolm Bookbinder, MD;  Location: [place];  Service: General;  Laterality: N/A;  . SPHINCTEROTOMY  11/29/2019   Procedure: Joan Mayans;  Surgeon: Clarene Essex, MD;  Location: [facility] ENDOSCOPY;  Service: Endoscopy;;       Family History  Problem Relation Age of Onset  . CVA Father     Social History   Tobacco Use  . Smoking status: Former Smoker    Packs/day: 0.30    Types: Cigarettes    Quit date: 10/08/2018    Years since quitting: 1.2  . Smokeless tobacco: Never Used  Substance Use Topics  . Alcohol use: Yes    Alcohol/week: 2.0 standard drinks    Types: 2 Cans of beer per week  . Drug use: Not on file    Home Medications Prior to Admission medications   Medication Sig Start Date End Date Taking? Authorizing Provider  acetaminophen (TYLENOL) 325 MG tablet Take 2 tablets (650 mg total) by mouth every 6 (six) hours as needed for mild pain or moderate pain. Patient not taking: Reported on 12/14/2019 11/23/19  Jillyn Ledger, PA-C  amLODipine (NORVASC) 10 MG tablet TAKE 1 TABLET BY MOUTH EVERY DAY 05/03/19   Kathrene Alu, MD  atorvastatin (LIPITOR) 40 MG tablet TAKE 1 TABLET (40 MG TOTAL) BY MOUTH DAILY AT 6 PM. Patient not taking: Reported on 11/22/2019 05/03/19   Kathrene Alu, MD  docusate sodium (COLACE) 100 MG capsule Take 1 capsule (100 mg total) by mouth 2 (two) times daily as needed. Patient not taking: Reported on 12/14/2019 11/23/19 11/22/20  Maczis, Barth Kirks, PA-C  ondansetron (ZOFRAN-ODT) 4 MG  disintegrating tablet Take 1 tablet (4 mg total) by mouth every 6 (six) hours as needed for nausea. Patient not taking: Reported on 12/14/2019 11/23/19   Maczis, Barth Kirks, PA-C  oxyCODONE (OXY IR/ROXICODONE) 5 MG immediate release tablet Take 1 tablet (5 mg total) by mouth every 6 (six) hours as needed for moderate pain or severe pain (5mg  for moderate pain, 10mg  for severe pain). 12/09/19   Saverio Danker, PA-C  polyethylene glycol (MIRALAX / GLYCOLAX) 17 g packet Take 17 g by mouth daily as needed. Patient not taking: Reported on 12/14/2019 11/23/19   Jillyn Ledger, PA-C    Allergies    Pork-derived products  Review of Systems   Review of Systems  Constitutional: Negative for chills and fever.  HENT: Negative for congestion, rhinorrhea and sore throat.   Eyes: Negative for visual disturbance.  Respiratory: Negative for cough and shortness of breath.   Cardiovascular: Negative for chest pain and leg swelling.  Gastrointestinal: Positive for abdominal pain. Negative for diarrhea, nausea and vomiting.  Genitourinary: Negative for dysuria.  Musculoskeletal: Negative for back pain and neck pain.  Skin: Negative for rash.  Neurological: Negative for dizziness, light-headedness and headaches.  Hematological: Does not bruise/bleed easily.  Psychiatric/Behavioral: Negative for confusion.    Physical Exam Updated Vital Signs BP 132/83   Pulse 77   Temp 97.6 F (36.4 C) (Oral)   Resp (!) 21   Ht 1.753 m (5\' 9" )   Wt 96.2 kg   SpO2 96%   BMI 31.31 kg/m   Physical Exam Vitals and nursing note reviewed.  Constitutional:      Appearance: Normal appearance. He is well-developed.  HENT:     Head: Normocephalic and atraumatic.  Eyes:     Conjunctiva/sclera: Conjunctivae normal.     Pupils: Pupils are equal, round, and reactive to light.  Cardiovascular:     Rate and Rhythm: Normal rate and regular rhythm.     Heart sounds: No murmur.  Pulmonary:     Effort: Pulmonary effort is  normal. No respiratory distress.     Breath sounds: Normal breath sounds.  Abdominal:     General: There is distension.     Palpations: Abdomen is soft.     Tenderness: There is no abdominal tenderness.     Comments: Bandage no erythema.  Not particularly tender.  Abdomen is distended.  The bandages stained with some biliary fluid.  Musculoskeletal:        General: Normal range of motion.     Cervical back: Neck supple.  Skin:    General: Skin is warm and dry.  Neurological:     General: No focal deficit present.     Mental Status: He is alert and oriented to person, place, and time.     Cranial Nerves: No cranial nerve deficit.     Sensory: No sensory deficit.     Motor: No weakness.     ED Results /  Procedures / Treatments   Labs (all labs ordered are listed, but only abnormal results are displayed) Labs Reviewed  COMPREHENSIVE METABOLIC PANEL - Abnormal; Notable for the following components:      Result Value   Glucose, Bld 103 (*)    Calcium 8.5 (*)    Total Protein 6.0 (*)    Albumin 2.4 (*)    AST 42 (*)    Total Bilirubin 2.3 (*)    All other components within normal limits  CBC WITH DIFFERENTIAL/PLATELET - Abnormal; Notable for the following components:   WBC 10.6 (*)    Neutro Abs 7.9 (*)    All other components within normal limits  ETHANOL  LIPASE, BLOOD    EKG None  Radiology No results found.  Procedures Procedures (including critical care time)  Medications Ordered in ED Medications  iohexol (OMNIPAQUE) 300 MG/ML solution 100 mL (100 mLs Intravenous Contrast Given 12/24/19 1504)    ED Course  I have reviewed the triage vital signs and the nursing notes.  Pertinent labs & imaging results that were available during my care of the patient were reviewed by me and considered in my medical decision making (see chart for details).    MDM Rules/Calculators/A&P                      Patient's labs show elevation in total bilirubin. White blood cell  count is kind of been where it is. Have ordered CT scan of the abdomen for further evaluation since he just had drain removed for biliary ascites. Most likely will need to discuss results with [place] surgery once the area are back.   Final Clinical Impression(s) / ED Diagnoses Final diagnoses:  Post-operative pain  Abdominal distention    Rx / DC Orders ED Discharge Orders    None       Fredia Sorrow, MD 12/24/19 1511

## 2019-12-24 NOTE — H&P (Signed)
Date: 12/24/2019               Patient Name:  Micheal Watson MRN: [id number]  DOB: 09-10-1960 Age / Sex: 59 y.o., male   PCP: Patient, No Pcp Per         Medical Service: Internal Medicine Teaching Service         Attending Physician: Dr. Heber [place], Rachel Moulds, DO    First Contact: Marianna Payment, DO, [place] Kitchen Pager: [phone])  Second Contact: Koleen Distance, DO, [place] Pager: Meriel Flavors [phone])       After Hours (After 5p/  First Contact Pager: [phone]  weekends / holidays): Second Contact Pager: [phone]   Chief Complaint: abdominal pain   History of Present Illness: Micheal Watson is a 59 y/o gentleman with history of heavy EtOH use, cholangitis s/p cholecystectomy and recent drain removal.   On 4/18 he was admitted under surgery's service to undergo laparoscopic cholecystectomy. He was re-admitted 6 days post-op for worsening pain and umbilical bile leakage.  He underwent ERCP, and a CBD stricture was found and a stent was placed, but no evidence of a bile leak.  A HIDA scan was also negative for a bile leak.  Despite this, the patient had what was felt to be bilious drainage from his umbilical port site.  The patient continued to have drainage and pain and was kept on abx therapy.  Ultimately, his drainage stopped, but pain and slightly elevated WBC persisted.  Repeat CT continued to reveal moderate to large amount of ascites.  He was taken to the OR for a dx laparoscopy to rule out bile leak, bowel injury, etc.  No evidence of the aforementioned was found, but 3L of bilious ascites was removed.  A JP drain was placed, and he was discharged with plan for outpatient drain removal which took place yesterday.   Since then, he has had persistent abdominal pain at the drain site. Rates it 6/10. Pain is worse with deep breathing.   Denies changes in bowel movements, n/v. Endorses good appetite. He has been told he has liver damage, but denies diagnosis of cirrhosis.   ED Course:   Lab  Orders     SARS Coronavirus 2 by RT PCR (hospital order, performed in [facility] hospital lab) Nasopharyngeal Nasopharyngeal Swab     Ethanol     Comprehensive metabolic panel     Lipase, blood     CBC with Differential/Platelet     Comprehensive metabolic panel     CBC     Protime-INR   Meds:  No outpatient medications have been marked as taking for the 12/24/19 encounter [facility] Encounter).    Social: former tobacco use. Drinks approx 2 beers per week.   Family History:  Family History  Problem Relation Age of Onset  . CVA Father      Allergies: Allergies as of 12/24/2019 - Review Complete 12/24/2019  Allergen Reaction Noted  . Pork-derived products Other (See Comments) 04/09/2019   Past Medical History:  Diagnosis Date  . Hepatitis   . Hyperlipidemia   . Hypertension   . Pneumonia   . Pre-diabetes      Review of Systems: A complete ROS was negative except as per HPI.   Physical Exam: Blood pressure 130/71, pulse 71, temperature 97.6 F (36.4 C), temperature source Oral, resp. rate (!) 21, height 5\' 9"  (1.753 m), weight 96.2 kg, SpO2 95 %. General: awake, alert, lying in bed in NAD HEENT: no scleral icterus  CV: RRR;  no m/r/g Pulm: normal work of breathing on room air; lungs CTAB Abd: BS+; abdomen soft, mildly tender at LLQ drain site. Positive fluid wave Ext: no edema. Clubbing of fingernails Neuro: A&Ox3; no focal deficits. No asterixis  Skin: laparoscopic scars with mild purulent drainage  Psych: appropriate mood and affect     Labs: CBC    Component Value Date/Time   WBC 10.6 (H) 12/24/2019 1147   RBC 4.54 12/24/2019 1147   HGB 14.9 12/24/2019 1147   HCT 45.4 12/24/2019 1147   HCT 34.7 (L) 04/09/2019 2238   PLT 252 12/24/2019 1147   MCV 100.0 12/24/2019 1147   MCH 32.8 12/24/2019 1147   MCHC 32.8 12/24/2019 1147   RDW 12.7 12/24/2019 1147   LYMPHSABS 1.7 12/24/2019 1147   MONOABS 0.8 12/24/2019 1147   EOSABS 0.1 12/24/2019 1147    BASOSABS 0.1 12/24/2019 1147     CMP     Component Value Date/Time   NA 135 12/24/2019 1147   K 4.3 12/24/2019 1147   CL 102 12/24/2019 1147   CO2 22 12/24/2019 1147   GLUCOSE 103 (H) 12/24/2019 1147   BUN 12 12/24/2019 1147   CREATININE 0.94 12/24/2019 1147   CALCIUM 8.5 (L) 12/24/2019 1147   PROT 6.0 (L) 12/24/2019 1147   ALBUMIN 2.4 (L) 12/24/2019 1147   AST 42 (H) 12/24/2019 1147   ALT 22 12/24/2019 1147   ALKPHOS 105 12/24/2019 1147   BILITOT 2.3 (H) 12/24/2019 1147   GFRNONAA >60 12/24/2019 1147   GFRAA >60 12/24/2019 1147    Imaging: CT Abdomen Pelvis W Contrast  Result Date: 12/24/2019 CLINICAL DATA:  Left lower quadrant abdominal pain. EXAM: CT ABDOMEN AND PELVIS WITH CONTRAST TECHNIQUE: Multidetector CT imaging of the abdomen and pelvis was performed using the standard protocol following bolus administration of intravenous contrast. CONTRAST:  190mL OMNIPAQUE IOHEXOL 300 MG/ML  SOLN COMPARISON:  Dec 06, 2019 FINDINGS: Lower chest: No acute abnormality. Hepatobiliary: A stable 6 mm focus of parenchymal low attenuation is seen within the anterior aspect of the right lobe of the liver (axial CT image 38, CT series number 3) a very small amount of perihepatic fluid is noted. Status post cholecystectomy. No biliary dilatation. A common bile duct stent is in place. Pancreas: Unremarkable. No pancreatic ductal dilatation or surrounding inflammatory changes. Spleen: Normal in size without focal abnormality. Adrenals/Urinary Tract: Adrenal glands are unremarkable. Kidneys are normal, without renal calculi, focal lesion, or hydronephrosis. Bladder is unremarkable. Stomach/Bowel: Stomach is within normal limits. Appendix appears normal. No evidence of bowel wall thickening, distention, or inflammatory changes. Noninflamed diverticula are seen within the sigmoid colon. Vascular/Lymphatic: No significant vascular findings are present. No enlarged abdominal or pelvic lymph nodes. Reproductive:  The prostate gland is moderately enlarged. Other: No abdominal wall hernia or abnormality. No abdominopelvic ascites. Musculoskeletal: Multilevel degenerative changes seen throughout the lumbar spine. IMPRESSION: 1. Evidence of prior cholecystectomy. 2. Common bile duct stent. 3. Noninflamed sigmoid diverticulosis. 4. Very small amount of perihepatic fluid, decreased in severity when compared to the prior exam. Electronically Signed   By: Virgina Norfolk M.D.   On: 12/24/2019 15:13     Assessment & Plan by Problem: Active Problems:   Abdominal pain   Micheal Watson is a 59 y.o. with pertinent PMH of recent lap choley complicated by development of bilious ascites and subsequently underwent ERCP with stent placement, followed by fluid removal and JP drain placement who presented with abdominal after having JP drain removed.   #Abdominal  pain Seems more likely related to recent JP drain removal, as pain is primarily located in LLQ. No systemic signs of infection or intra-abdominal process. Repeat CT scan showed very small amount of perihepatic fluid, but otherwise unremarkable. No evidence of cirrhosis or ascites. CMP with mildly elevated t bili but no evidence of biliary obstruction. Surgery was consulted by EDP and did not think he needed repeat surgical intervention.  Will admit overnight for pain control and trend his labs.    Diet: Normal, NPO at midnight  VTE: SCDs IVF: None,None Code: Full   Dispo: Admit patient to Observation with expected length of stay less than 2 midnights.  SignedDelice Bison, DO 12/24/2019, 7:14 PM  Pager: [phone]

## 2019-12-24 NOTE — ED Notes (Signed)
Report attempted, 6N unable to take at this time 

## 2019-12-24 NOTE — ED Notes (Signed)
Pt transported to CT ?

## 2019-12-24 NOTE — ED Triage Notes (Signed)
pt states he had cholecystectomy on 4/19 and had drained removed yesterday. Pt here today due to pain around site of tube removal. Denies any fevers, chills N/V.

## 2019-12-24 NOTE — ED Provider Notes (Signed)
Patient signed out to me at 3:30 PM.  Awaiting surgery consultation.  Patient with recent cholangitis, cholecystitis status post common bile duct stent, biliary drain for postop complications.  Had biliary drain removed yesterday at surgery follow-up.  Since then has had increasing pain and some drainage from his biliary site.  No nausea, no vomiting, no fever.  Bilirubin mildly elevated from prior at 2.3.  Otherwise liver enzymes and lipase unremarkable.  Patient had CT scan that showed some perihepatic fluid but improved from prior however prior CT scan was 1 patient had postop biliary leak with 3 L of ascites.  Given that he has increasing pain since drain was removed concerned that possibly a small leak is still occurring.  We will have him seen by surgery in the ED for further recommendations.  Surgical sites overall appear well there is some mild erythema and some dried pus at the biliary drain site.  General surgery believes patient would benefit from an admission.  Internal medicine to admit the patient.  Will trend abdominal labs.  Possibly consult GI in the morning.  Patient was seen previously by [facility] for ERCP and CBD stent.  This chart was dictated using voice recognition software.  Despite best efforts to proofread,  errors can occur which can change the documentation meaning.     Lennice Sites, DO 12/24/19 1808

## 2019-12-25 ENCOUNTER — Encounter (HOSPITAL_COMMUNITY): Payer: Self-pay | Admitting: Internal Medicine

## 2019-12-25 ENCOUNTER — Other Ambulatory Visit: Payer: Self-pay

## 2019-12-25 DIAGNOSIS — R1084 Generalized abdominal pain: Secondary | ICD-10-CM

## 2019-12-25 LAB — CBC
HCT: 45.6 % (ref 39.0–52.0)
Hemoglobin: 15.2 g/dL (ref 13.0–17.0)
MCH: 32.5 pg (ref 26.0–34.0)
MCHC: 33.3 g/dL (ref 30.0–36.0)
MCV: 97.4 fL (ref 80.0–100.0)
Platelets: 231 10*3/uL (ref 150–400)
RBC: 4.68 MIL/uL (ref 4.22–5.81)
RDW: 12.7 % (ref 11.5–15.5)
WBC: 9 10*3/uL (ref 4.0–10.5)
nRBC: 0 % (ref 0.0–0.2)

## 2019-12-25 LAB — COMPREHENSIVE METABOLIC PANEL
ALT: 18 U/L (ref 0–44)
AST: 38 U/L (ref 15–41)
Albumin: 2.2 g/dL — ABNORMAL LOW (ref 3.5–5.0)
Alkaline Phosphatase: 102 U/L (ref 38–126)
Anion gap: 10 (ref 5–15)
BUN: 9 mg/dL (ref 6–20)
CO2: 24 mmol/L (ref 22–32)
Calcium: 8.1 mg/dL — ABNORMAL LOW (ref 8.9–10.3)
Chloride: 103 mmol/L (ref 98–111)
Creatinine, Ser: 0.87 mg/dL (ref 0.61–1.24)
GFR calc Af Amer: >60 mL/min (ref >60)
GFR calc non Af Amer: >60 mL/min (ref >60)
Glucose, Bld: 114 mg/dL — ABNORMAL HIGH (ref 70–99)
Potassium: 3.6 mmol/L (ref 3.5–5.1)
Sodium: 137 mmol/L (ref 135–145)
Total Bilirubin: 0.8 mg/dL (ref 0.3–1.2)
Total Protein: 5.8 g/dL — ABNORMAL LOW (ref 6.5–8.1)

## 2019-12-25 LAB — TROPONIN I (HIGH SENSITIVITY): Troponin I (High Sensitivity): 5 ng/L (ref <18)

## 2019-12-25 MED ORDER — ALUM & MAG HYDROXIDE-SIMETH 200-200-20 MG/5ML PO SUSP
15.0000 mL | Freq: Once | ORAL | Status: AC
  Administered 2019-12-25: 15 mL via ORAL
  Filled 2019-12-25: qty 30

## 2019-12-25 MED ORDER — OXYCODONE HCL 5 MG PO TABS: 5.0000 mg | ORAL_TABLET | ORAL | 0 refills | Status: AC | PRN

## 2019-12-25 NOTE — Discharge Summary (Signed)
Name: Micheal Watson MRN: [id number] DOB: Jul 18, 1961 59 y.o. PCP: Patient, No Pcp Per  Date of Admission: 12/24/2019  8:22 AM Date of Discharge: 12/25/2019 Attending Physician: No att. providers found  Discharge Diagnosis: 1. Abdominal pain  Discharge Medications: Allergies as of 12/25/2019      Reactions   Pork-derived Products Other (See Comments)   "I have pain in my stomach if I eat this"      Medication List    STOP taking these medications   acetaminophen 325 MG tablet Commonly known as: TYLENOL     TAKE these medications   amLODipine 10 MG tablet Commonly known as: NORVASC TAKE 1 TABLET BY MOUTH EVERY DAY   atorvastatin 40 MG tablet Commonly known as: LIPITOR TAKE 1 TABLET (40 MG TOTAL) BY MOUTH DAILY AT 6 PM.   docusate sodium 100 MG capsule Commonly known as: Colace Take 1 capsule (100 mg total) by mouth 2 (two) times daily as needed.   ondansetron 4 MG disintegrating tablet Commonly known as: ZOFRAN-ODT Take 1 tablet (4 mg total) by mouth every 6 (six) hours as needed for nausea.   oxyCODONE 5 MG immediate release tablet Commonly known as: Oxy IR/ROXICODONE Take 1 tablet (5 mg total) by mouth every 4 (four) hours as needed for up to 5 days. What changed:   when to take this  reasons to take this   polyethylene glycol 17 g packet Commonly known as: MIRALAX / GLYCOLAX Take 17 g by mouth daily as needed.       Disposition and follow-up:   Mr.Debbie Coulibaly was discharged from [facility] in Good condition.  At the hospital follow up visit please address:  1.  Abdominal pain: likely related to recent JP drain removal. Improved overnight. Ensure his symptoms have continued to resolve and that he has follow-up with surgery   2.  Labs / imaging needed at time of follow-up: CMP  3.  Pending labs/ test needing follow-up: none  Follow-up Appointments: Follow-up Information    Surgery, [place]. Schedule an  appointment as soon as possible for a visit in 2 week(s).   Specialty: General Surgery Contact information: [address] [place] [address] [id number]        Charlott Rakes, MD. Schedule an appointment as soon as possible for a visit in 1 week(s).   Specialty: Family Medicine Contact information: [place] [address] [place] Hospital Course by problem list: Sirjames Nim a 59 y.o.with pertinent PMH of recent lap choley complicated by development of bilious ascites and subsequently underwent ERCP with stent placement, followed by fluid removal and JP drain placementwho presented with abdominal after having JP drain removed. 1. Abdominal pain: Patient presented with abdominal pain after having JP drain removed the previous day. CT abd/pelvis showed small amount of perihepatic fluid but was otherwise unremarkable. CMP with mildly elevated tbili at 2.3, but normal liver function. He was afebrile without white count. He was admitted overnight for pain control and to monitor labs given his recent history complicated surgical history. He had improved overnight and repeat CMP showed normal tbili. Suspect most of his pain was related to recent drain removal and post-op pain which should continue to improve with time. He was prescribed a short course of pain medication and encouraged to follow-up with his PCP and general surgery.   2. Chest pain: patient also endorsed chest pain described  as burning, and had epigastric tenderness on exam. EKG and troponin were done to rule out ACS which were normal. If symptoms persist, would recommend trial of PPI.   Discharge Vitals:   BP 115/74 (BP Location: Right Arm)   Pulse 80   Temp 98.8 F (37.1 C) (Oral)   Resp 18   Ht 5\' 9"  (1.753 m)   Wt 88.2 kg   SpO2 93%   BMI 28.71 kg/m   Pertinent Labs, Studies, and Procedures:  CMP Latest Ref Rng & Units 12/25/2019 12/24/2019 12/08/2019    Glucose 70 - 99 mg/dL 114(H) 103(H) 165(H)  BUN 6 - 20 mg/dL 9 12 6   Creatinine 0.61 - 1.24 mg/dL 0.87 0.94 0.78  Sodium 135 - 145 mmol/L 137 135 135  Potassium 3.5 - 5.1 mmol/L 3.6 4.3 4.2  Chloride 98 - 111 mmol/L 103 102 100  CO2 22 - 32 mmol/L 24 22 24   Calcium 8.9 - 10.3 mg/dL 8.1(L) 8.5(L) 8.4(L)  Total Protein 6.5 - 8.1 g/dL 5.8(L) 6.0(L) 6.0(L)  Total Bilirubin 0.3 - 1.2 mg/dL 0.8 2.3(H) 0.8  Alkaline Phos 38 - 126 U/L 102 105 118  AST 15 - 41 U/L 38 42(H) 50(H)  ALT 0 - 44 U/L 18 22 32   CT abd/pelvis with contrast IMPRESSION: 1. Evidence of prior cholecystectomy. 2. Common bile duct stent. 3. Noninflamed sigmoid diverticulosis. 4. Very small amount of perihepatic fluid, decreased in severity when compared to the prior exam.   Discharge Instructions: Discharge Instructions    Call MD for:  persistant nausea and vomiting   Complete by: As directed    Call MD for:  severe uncontrolled pain   Complete by: As directed    Call MD for:  temperature >100.4   Complete by: As directed    Diet - low sodium heart healthy   Complete by: As directed    Discharge instructions   Complete by: As directed    Mr. Knippenberg, It was a pleasure taking care of you. Your work-up in the hospital did not show any abnormalities. Your pain should continue to improve as you get further out from surgery and drain removal.  Please follow up with your primary care physician regarding the chest pain, which may be acid reflux. Please schedule another follow-up appointment with your surgeon.   Take care!   Sr. Girdler, Fue un placer cuidar de ti. Su evaluacin en el hospital no mostr ninguna anomala. Su dolor debe continuar mejorando a medida que se aleja ms de la [place] y la extraccin del drenaje. Haga un seguimiento con su mdico de atencin primaria con respecto al [employer] pecho, que puede ser reflujo cido. Programe otra cita de seguimiento con su cirujano.  Cudate!    Increase activity slowly   Complete by: As directed       Signed: Delice Bison, DO 12/26/2019, 2:17 PM   Pager: [phone]

## 2019-12-25 NOTE — Progress Notes (Signed)
   Subjective: No acute events overnight. Abdominal pain improved from yesterday. Endorses some chest pain that he describes as burning that is an intermittent issue for him. No fevers, chills, n/v, changes in BMs.   Objective:  Vital signs in last 24 hours: Vitals:   12/24/19 2100 12/25/19 0042 12/25/19 0439 12/25/19 1033  BP: 112/76 123/71 (!) 142/81 126/72  Pulse: 73 80 80 75  Resp: 20 18 18 18   Temp: 99 F (37.2 C) 98.7 F (37.1 C) 98.4 F (36.9 C) 99.1 F (37.3 C)  TempSrc: Oral Oral Oral Oral  SpO2: 98% 97% 99% 96%  Weight: 88.2 kg     Height: 5\' 9"  (1.753 m)      General: awake, alert, lying in bed in NAD CV: RRR Pulm: normal work of breathing; lungs CTAB Abd: BS+; abdomen soft, non-distended, epigastric tenderness   Assessment/Plan:  Principal Problem:   Abdominal pain  Micheal Watson is a 59 y.o. with pertinent PMH of recent lap choley complicated by development of bilious ascites and subsequently underwent ERCP with stent placement, followed by fluid removal and JP drain placement who presented with abdominal after having JP drain removed.   #Abdominal pain -suspect this all related to post-surgical pain -repeat CMP this morning reassuring with normal LFTs and tbili  -tolerating PO  -will provide short course of pain medication  -stable for discharge home to follow-up with surgery and PCP  #Chest pain -EKG and trop normal -likely epigastric in nature -if persists would recommend trial of PPI with PCP   Prior to Admission Living Arrangement: home Anticipated Discharge Location: home Barriers to Discharge: none Dispo: Anticipated discharge today.   Modena Nunnery D, DO 12/25/2019, 11:24 AM

## 2019-12-25 NOTE — Plan of Care (Signed)
°  Problem: Education: °Goal: Knowledge of General Education information will improve °Description: Including pain rating scale, medication(s)/side effects and non-pharmacologic comfort measures °Outcome: Progressing °  °Problem: Health Behavior/Discharge Planning: °Goal: Ability to manage health-related needs will improve °Outcome: Progressing °  °Problem: Clinical Measurements: °Goal: Ability to maintain clinical measurements within normal limits will improve °Outcome: Progressing °Goal: Respiratory complications will improve °Outcome: Progressing °  °Problem: Activity: °Goal: Risk for activity intolerance will decrease °Outcome: Progressing °  °Problem: Pain Managment: °Goal: General experience of comfort will improve °Outcome: Progressing °  °Problem: Safety: °Goal: Ability to remain free from injury will improve °Outcome: Progressing °  °Problem: Skin Integrity: °Goal: Risk for impaired skin integrity will decrease °Outcome: Progressing °  °

## 2019-12-25 NOTE — Progress Notes (Signed)
AVS given and reviewed with pt via video interpreter. Medications discussed. All questions answered to satisfaction. Pt verbalized understanding of information given. Pt escorted off the unit with all belongings via wheelchair by staff member.

## 2020-01-25 ENCOUNTER — Ambulatory Visit: Payer: Self-pay | Admitting: Family Medicine

## 2020-02-23 ENCOUNTER — Other Ambulatory Visit: Payer: Self-pay | Admitting: Gastroenterology

## 2020-02-23 ENCOUNTER — Other Ambulatory Visit: Payer: Self-pay | Admitting: Student

## 2020-02-23 ENCOUNTER — Inpatient Hospital Stay (HOSPITAL_COMMUNITY)
Admission: EM | Admit: 2020-02-23 | Discharge: 2020-03-20 | DRG: 919 | Disposition: A | Discharge status: Home / Self Care | Payer: Self-pay | Attending: Internal Medicine | Admitting: Internal Medicine

## 2020-02-23 ENCOUNTER — Encounter (HOSPITAL_COMMUNITY): Payer: Self-pay | Admitting: Emergency Medicine

## 2020-02-23 ENCOUNTER — Other Ambulatory Visit: Payer: Self-pay

## 2020-02-23 DIAGNOSIS — R948 Abnormal results of function studies of other organs and systems: Secondary | ICD-10-CM | POA: Diagnosis present

## 2020-02-23 DIAGNOSIS — B9620 Unspecified Escherichia coli [E. coli] as the cause of diseases classified elsewhere: Secondary | ICD-10-CM | POA: Diagnosis present

## 2020-02-23 DIAGNOSIS — Z9049 Acquired absence of other specified parts of digestive tract: Secondary | ICD-10-CM

## 2020-02-23 DIAGNOSIS — Z20822 Contact with and (suspected) exposure to covid-19: Secondary | ICD-10-CM | POA: Diagnosis present

## 2020-02-23 DIAGNOSIS — R109 Unspecified abdominal pain: Secondary | ICD-10-CM

## 2020-02-23 DIAGNOSIS — Z91018 Allergy to other foods: Secondary | ICD-10-CM

## 2020-02-23 DIAGNOSIS — K219 Gastro-esophageal reflux disease without esophagitis: Secondary | ICD-10-CM | POA: Diagnosis present

## 2020-02-23 DIAGNOSIS — Z8701 Personal history of pneumonia (recurrent): Secondary | ICD-10-CM

## 2020-02-23 DIAGNOSIS — Y732 Prosthetic and other implants, materials and accessory gastroenterology and urology devices associated with adverse incidents: Secondary | ICD-10-CM | POA: Diagnosis present

## 2020-02-23 DIAGNOSIS — Z9119 Patient's noncompliance with other medical treatment and regimen: Secondary | ICD-10-CM

## 2020-02-23 DIAGNOSIS — T8144XA Sepsis following a procedure, initial encounter: Secondary | ICD-10-CM | POA: Diagnosis present

## 2020-02-23 DIAGNOSIS — G629 Polyneuropathy, unspecified: Secondary | ICD-10-CM | POA: Diagnosis present

## 2020-02-23 DIAGNOSIS — R7303 Prediabetes: Secondary | ICD-10-CM | POA: Diagnosis present

## 2020-02-23 DIAGNOSIS — B964 Proteus (mirabilis) (morganii) as the cause of diseases classified elsewhere: Secondary | ICD-10-CM | POA: Diagnosis present

## 2020-02-23 DIAGNOSIS — Z6829 Body mass index (BMI) 29.0-29.9, adult: Secondary | ICD-10-CM

## 2020-02-23 DIAGNOSIS — Z4689 Encounter for fitting and adjustment of other specified devices: Secondary | ICD-10-CM

## 2020-02-23 DIAGNOSIS — K861 Other chronic pancreatitis: Secondary | ICD-10-CM | POA: Diagnosis present

## 2020-02-23 DIAGNOSIS — Z8616 Personal history of COVID-19: Secondary | ICD-10-CM

## 2020-02-23 DIAGNOSIS — I10 Essential (primary) hypertension: Secondary | ICD-10-CM | POA: Diagnosis present

## 2020-02-23 DIAGNOSIS — K5900 Constipation, unspecified: Secondary | ICD-10-CM | POA: Diagnosis not present

## 2020-02-23 DIAGNOSIS — B961 Klebsiella pneumoniae [K. pneumoniae] as the cause of diseases classified elsewhere: Secondary | ICD-10-CM | POA: Diagnosis present

## 2020-02-23 DIAGNOSIS — A4159 Other Gram-negative sepsis: Secondary | ICD-10-CM | POA: Diagnosis present

## 2020-02-23 DIAGNOSIS — E538 Deficiency of other specified B group vitamins: Secondary | ICD-10-CM | POA: Diagnosis present

## 2020-02-23 DIAGNOSIS — E669 Obesity, unspecified: Secondary | ICD-10-CM | POA: Diagnosis present

## 2020-02-23 DIAGNOSIS — K652 Spontaneous bacterial peritonitis: Secondary | ICD-10-CM | POA: Diagnosis present

## 2020-02-23 DIAGNOSIS — T85590A Other mechanical complication of bile duct prosthesis, initial encounter: Principal | ICD-10-CM | POA: Diagnosis present

## 2020-02-23 DIAGNOSIS — K6289 Other specified diseases of anus and rectum: Secondary | ICD-10-CM | POA: Diagnosis not present

## 2020-02-23 DIAGNOSIS — E8770 Fluid overload, unspecified: Secondary | ICD-10-CM | POA: Diagnosis present

## 2020-02-23 DIAGNOSIS — R17 Unspecified jaundice: Secondary | ICD-10-CM

## 2020-02-23 DIAGNOSIS — Y838 Other surgical procedures as the cause of abnormal reaction of the patient, or of later complication, without mention of misadventure at the time of the procedure: Secondary | ICD-10-CM | POA: Diagnosis present

## 2020-02-23 DIAGNOSIS — F10230 Alcohol dependence with withdrawal, uncomplicated: Secondary | ICD-10-CM | POA: Diagnosis present

## 2020-02-23 DIAGNOSIS — I739 Peripheral vascular disease, unspecified: Secondary | ICD-10-CM | POA: Diagnosis present

## 2020-02-23 DIAGNOSIS — Z87891 Personal history of nicotine dependence: Secondary | ICD-10-CM

## 2020-02-23 DIAGNOSIS — E871 Hypo-osmolality and hyponatremia: Secondary | ICD-10-CM

## 2020-02-23 DIAGNOSIS — K831 Obstruction of bile duct: Secondary | ICD-10-CM | POA: Diagnosis present

## 2020-02-23 DIAGNOSIS — K750 Abscess of liver: Secondary | ICD-10-CM | POA: Diagnosis present

## 2020-02-23 DIAGNOSIS — R188 Other ascites: Secondary | ICD-10-CM

## 2020-02-23 DIAGNOSIS — R0602 Shortness of breath: Secondary | ICD-10-CM

## 2020-02-23 DIAGNOSIS — E785 Hyperlipidemia, unspecified: Secondary | ICD-10-CM | POA: Diagnosis present

## 2020-02-23 DIAGNOSIS — K7031 Alcoholic cirrhosis of liver with ascites: Secondary | ICD-10-CM | POA: Diagnosis present

## 2020-02-23 DIAGNOSIS — K651 Peritoneal abscess: Secondary | ICD-10-CM

## 2020-02-23 DIAGNOSIS — C250 Malignant neoplasm of head of pancreas: Secondary | ICD-10-CM | POA: Diagnosis present

## 2020-02-23 LAB — COMPREHENSIVE METABOLIC PANEL
ALT: 50 U/L — ABNORMAL HIGH (ref 0–44)
AST: 93 U/L — ABNORMAL HIGH (ref 15–41)
Albumin: 2.5 g/dL — ABNORMAL LOW (ref 3.5–5.0)
Alkaline Phosphatase: 241 U/L — ABNORMAL HIGH (ref 38–126)
Anion gap: 13 (ref 5–15)
BUN: 14 mg/dL (ref 6–20)
CO2: 20 mmol/L — ABNORMAL LOW (ref 22–32)
Calcium: 8.3 mg/dL — ABNORMAL LOW (ref 8.9–10.3)
Chloride: 90 mmol/L — ABNORMAL LOW (ref 98–111)
Creatinine, Ser: 1.1 mg/dL (ref 0.61–1.24)
GFR calc Af Amer: >60 mL/min (ref >60)
GFR calc non Af Amer: >60 mL/min (ref >60)
Glucose, Bld: 139 mg/dL — ABNORMAL HIGH (ref 70–99)
Potassium: 3.9 mmol/L (ref 3.5–5.1)
Sodium: 123 mmol/L — ABNORMAL LOW (ref 135–145)
Total Bilirubin: 19.3 mg/dL (ref 0.3–1.2)
Total Protein: 7.3 g/dL (ref 6.5–8.1)

## 2020-02-23 LAB — CBC
HCT: 39.7 % (ref 39.0–52.0)
Hemoglobin: 14.2 g/dL (ref 13.0–17.0)
MCH: 33.3 pg (ref 26.0–34.0)
MCHC: 35.8 g/dL (ref 30.0–36.0)
MCV: 93.2 fL (ref 80.0–100.0)
Platelets: 249 10*3/uL (ref 150–400)
RBC: 4.26 MIL/uL (ref 4.22–5.81)
RDW: 16.2 % — ABNORMAL HIGH (ref 11.5–15.5)
WBC: 16 10*3/uL — ABNORMAL HIGH (ref 4.0–10.5)
nRBC: 0 % (ref 0.0–0.2)

## 2020-02-23 LAB — LIPASE, BLOOD: Lipase: 118 U/L — ABNORMAL HIGH (ref 11–51)

## 2020-02-23 MED ORDER — SODIUM CHLORIDE 0.9% FLUSH: 3.0000 mL | Freq: Once | INTRAVENOUS | Status: DC

## 2020-02-23 NOTE — H&P (Signed)
Patient: Judith Campillo Appointment: 02/23/20 Preferred language: Spanish  History of present illness: Patient is a 59 year old male presenting status post cholecystectomy on 11/22/19 by Dr. Grandville Silos, found to have severe cholecystitis with hydrops.  Postoperatively, he developed a bile leak and underwent ERCP by Dr. Watt Climes on 11/29/19.  While no clear bile leak was either seen on HIDA scan nor during ERCP, he continued to have some drainage from his umbilical wound.  He underwent exploratory laparoscopy on 12/07/19 by Dr. Donne Hazel.  No injuries or abnormalities were noted except for ascites.  A JP drain was placed at that time.  The drain was putting out about 150 cc per day when he followed up with Dr. Grandville Silos.  On 12/24/19, his drain was putting out between 400-500 cc of fluid per day, but he denied abdominal pain, nausea, vomiting, or fevers.  His drain was removed in the office on 12/23/19.    He was supposed to follow-up with Dr. Grandville Silos a few weeks later as well as GI, but has not followed up with either.  He does not have a good support system here and does not have any definitive transportation.    **He was dropped off at the office earlier today as a walk-in with complaints of intense abdominal pain and swelling  2 days.  He states he was doing perfectly fine prior.  Over the past 2 days, he has vomited 2-3 times a day and admits to blood in his urine and stool.  He also admits to fever and chills.  He has not eaten in the past 2 days due to pain.  ROS: General: admits to fever and chills Abdomen: admits to swelling and pain UT:MLYYTK to hematuria GI: Admits to hematochezia  Physical exam: Ill-appearing, positive scleral icterus Abdomen is tense, distended, and very tender to palpation Walking slowly due to pain T 102F, P 92, BP 148/92  Assessment and plan: Patient is presenting status post cholecystectomy and plastic stent placement for bile leak by GI.  Over the past 2 days he  developed intense abdominal pain, swelling, jaundice, vomiting and fevers.    Labs obtained earlier today showed T bili19.4, alkaline phosphatase 277, and AST 92.  Sodium 124.  WBC 15.6.  Based on physical exam and lab findings, we recommend going to the emergency room for further workup.  Due to lack of transportation, EMS was called and transported patient to emergency room.  Carlena Hurl, PA-C 02/23/20

## 2020-02-23 NOTE — ED Triage Notes (Signed)
Patient arrives to ED via EMS with complaints of generalized abdominal pain, abdominal swelling, and jaundice over the past x2 days.  Patient states he has vomited and had fevers today as well. Patient states his stomach has grown so much it is causing him to be SOB. Pt with hx of bile leak post cholecystectomy.

## 2020-02-24 ENCOUNTER — Inpatient Hospital Stay (HOSPITAL_COMMUNITY): Payer: Self-pay | Admitting: Anesthesiology

## 2020-02-24 ENCOUNTER — Encounter (HOSPITAL_COMMUNITY)
Admission: EM | Disposition: A | Discharge status: Home / Self Care | Payer: Self-pay | Source: Home / Self Care | Attending: Family Medicine

## 2020-02-24 ENCOUNTER — Emergency Department (HOSPITAL_COMMUNITY): Payer: Self-pay

## 2020-02-24 ENCOUNTER — Inpatient Hospital Stay (HOSPITAL_COMMUNITY): Payer: Self-pay

## 2020-02-24 ENCOUNTER — Encounter (HOSPITAL_COMMUNITY): Payer: Self-pay | Admitting: Internal Medicine

## 2020-02-24 DIAGNOSIS — T8144XA Sepsis following a procedure, initial encounter: Secondary | ICD-10-CM

## 2020-02-24 DIAGNOSIS — F10230 Alcohol dependence with withdrawal, uncomplicated: Secondary | ICD-10-CM | POA: Diagnosis present

## 2020-02-24 HISTORY — PX: BILIARY BRUSHING: SHX6843

## 2020-02-24 HISTORY — PX: ERCP: SHX5425

## 2020-02-24 HISTORY — PX: STENT REMOVAL: SHX6421

## 2020-02-24 HISTORY — PX: BILIARY STENT PLACEMENT: SHX5538

## 2020-02-24 LAB — SARS CORONAVIRUS 2 BY RT PCR (HOSPITAL ORDER, PERFORMED IN ~~LOC~~ HOSPITAL LAB): SARS Coronavirus 2: NEGATIVE

## 2020-02-24 LAB — URINALYSIS, ROUTINE W REFLEX MICROSCOPIC
Glucose, UA: NEGATIVE mg/dL
Hgb urine dipstick: NEGATIVE
Ketones, ur: NEGATIVE mg/dL
Leukocytes,Ua: NEGATIVE
Nitrite: NEGATIVE
Protein, ur: NEGATIVE mg/dL
Specific Gravity, Urine: 1.042 — ABNORMAL HIGH (ref 1.005–1.030)
pH: 5 (ref 5.0–8.0)

## 2020-02-24 LAB — BLOOD CULTURE ID PANEL (REFLEXED)
Acinetobacter baumannii: NOT DETECTED
Candida albicans: NOT DETECTED
Candida glabrata: NOT DETECTED
Candida krusei: NOT DETECTED
Candida parapsilosis: NOT DETECTED
Candida tropicalis: NOT DETECTED
Carbapenem resistance: NOT DETECTED
Enterobacter cloacae complex: NOT DETECTED
Enterobacteriaceae species: DETECTED — AB
Enterococcus species: NOT DETECTED
Escherichia coli: DETECTED — AB
Haemophilus influenzae: NOT DETECTED
Klebsiella oxytoca: NOT DETECTED
Klebsiella pneumoniae: DETECTED — AB
Listeria monocytogenes: NOT DETECTED
Neisseria meningitidis: NOT DETECTED
Proteus species: DETECTED — AB
Pseudomonas aeruginosa: NOT DETECTED
Serratia marcescens: NOT DETECTED
Staphylococcus aureus (BCID): NOT DETECTED
Staphylococcus species: NOT DETECTED
Streptococcus agalactiae: NOT DETECTED
Streptococcus pneumoniae: NOT DETECTED
Streptococcus pyogenes: NOT DETECTED
Streptococcus species: NOT DETECTED

## 2020-02-24 LAB — PROTIME-INR
INR: 1.3 — ABNORMAL HIGH (ref 0.8–1.2)
Prothrombin Time: 16.1 seconds — ABNORMAL HIGH (ref 11.4–15.2)

## 2020-02-24 LAB — LACTIC ACID, PLASMA
Lactic Acid, Venous: 2.2 mmol/L (ref 0.5–1.9)
Lactic Acid, Venous: 1.9 mmol/L (ref 0.5–1.9)

## 2020-02-24 LAB — BILIRUBIN, DIRECT: Bilirubin, Direct: 12.5 mg/dL — ABNORMAL HIGH (ref 0.0–0.2)

## 2020-02-24 SURGERY — ERCP, WITH INTERVENTION IF INDICATED
Anesthesia: General

## 2020-02-24 MED ORDER — ALBUTEROL SULFATE HFA 108 (90 BASE) MCG/ACT IN AERS
INHALATION_SPRAY | RESPIRATORY_TRACT | Status: DC | PRN
Start: 2020-02-24 — End: 2020-02-24
  Administered 2020-02-24: 4 via RESPIRATORY_TRACT
  Administered 2020-02-24: 6 via RESPIRATORY_TRACT

## 2020-02-24 MED ORDER — THIAMINE HCL 100 MG/ML IJ SOLN: 100.0000 mg | Freq: Every day | INTRAMUSCULAR | Status: DC

## 2020-02-24 MED ORDER — LORAZEPAM 1 MG PO TABS: 1.0000 mg | ORAL_TABLET | ORAL | Status: DC | PRN

## 2020-02-24 MED ORDER — SODIUM CHLORIDE 0.9 % IV SOLN
2.0000 g | INTRAVENOUS | Status: DC
  Administered 2020-02-25 – 2020-03-02 (×7): 2 g via INTRAVENOUS
  Filled 2020-02-24 (×7): qty 20

## 2020-02-24 MED ORDER — ROCURONIUM BROMIDE 10 MG/ML (PF) SYRINGE
PREFILLED_SYRINGE | INTRAVENOUS | Status: DC | PRN
  Administered 2020-02-24: 50 mg via INTRAVENOUS

## 2020-02-24 MED ORDER — SODIUM CHLORIDE 0.9 % IV BOLUS (SEPSIS)
1000.0000 mL | Freq: Once | INTRAVENOUS | Status: AC
  Administered 2020-02-24: 1000 mL via INTRAVENOUS

## 2020-02-24 MED ORDER — GLUCAGON HCL RDNA (DIAGNOSTIC) 1 MG IJ SOLR
INTRAMUSCULAR | Status: DC | PRN
Start: 2020-02-24 — End: 2020-02-24
  Administered 2020-02-24: .5 mg via INTRAVENOUS

## 2020-02-24 MED ORDER — LIDOCAINE 2% (20 MG/ML) 5 ML SYRINGE
INTRAMUSCULAR | Status: DC | PRN
  Administered 2020-02-24: 40 mg via INTRAVENOUS

## 2020-02-24 MED ORDER — ONDANSETRON HCL 4 MG/2ML IJ SOLN
INTRAMUSCULAR | Status: DC | PRN
  Administered 2020-02-24: 4 mg via INTRAVENOUS

## 2020-02-24 MED ORDER — DEXAMETHASONE SODIUM PHOSPHATE 10 MG/ML IJ SOLN
INTRAMUSCULAR | Status: DC | PRN
  Administered 2020-02-24: 10 mg via INTRAVENOUS

## 2020-02-24 MED ORDER — PIPERACILLIN-TAZOBACTAM 3.375 G IVPB
3.3750 g | Freq: Three times a day (TID) | INTRAVENOUS | Status: DC
  Administered 2020-02-24: 3.375 g via INTRAVENOUS
  Filled 2020-02-24: qty 50

## 2020-02-24 MED ORDER — GLUCAGON HCL RDNA (DIAGNOSTIC) 1 MG IJ SOLR
INTRAMUSCULAR | Status: AC
  Filled 2020-02-24: qty 1

## 2020-02-24 MED ORDER — LORAZEPAM 2 MG/ML IJ SOLN: 1.0000 mg | INTRAMUSCULAR | Status: DC | PRN

## 2020-02-24 MED ORDER — SUGAMMADEX SODIUM 200 MG/2ML IV SOLN
INTRAVENOUS | Status: DC | PRN
  Administered 2020-02-24: 300 mg via INTRAVENOUS

## 2020-02-24 MED ORDER — FENTANYL CITRATE (PF) 100 MCG/2ML IJ SOLN
INTRAMUSCULAR | Status: DC | PRN
  Administered 2020-02-24 (×2): 50 ug via INTRAVENOUS

## 2020-02-24 MED ORDER — INDOMETHACIN 50 MG RE SUPP
RECTAL | Status: DC | PRN
  Administered 2020-02-24: 100 mg via RECTAL

## 2020-02-24 MED ORDER — ADULT MULTIVITAMIN W/MINERALS CH
1.0000 | ORAL_TABLET | Freq: Every day | ORAL | Status: DC
  Administered 2020-02-25 – 2020-03-20 (×25): 1 via ORAL
  Filled 2020-02-24 (×25): qty 1

## 2020-02-24 MED ORDER — THIAMINE HCL 100 MG PO TABS
100.0000 mg | ORAL_TABLET | Freq: Every day | ORAL | Status: DC
  Administered 2020-02-25 – 2020-03-20 (×25): 100 mg via ORAL
  Filled 2020-02-24 (×25): qty 1

## 2020-02-24 MED ORDER — PIPERACILLIN-TAZOBACTAM 3.375 G IVPB 30 MIN: 3.3750 g | Freq: Four times a day (QID) | INTRAVENOUS | Status: DC

## 2020-02-24 MED ORDER — MIDAZOLAM HCL 2 MG/2ML IJ SOLN
INTRAMUSCULAR | Status: DC | PRN
  Administered 2020-02-24: 2 mg via INTRAVENOUS

## 2020-02-24 MED ORDER — SODIUM CHLORIDE 0.9 % IV SOLN
INTRAVENOUS | Status: DC | PRN
  Administered 2020-02-24: 25 mL

## 2020-02-24 MED ORDER — FOLIC ACID 1 MG PO TABS
1.0000 mg | ORAL_TABLET | Freq: Every day | ORAL | Status: DC
  Administered 2020-02-25 – 2020-03-20 (×25): 1 mg via ORAL
  Filled 2020-02-24 (×25): qty 1

## 2020-02-24 MED ORDER — INDOMETHACIN 50 MG RE SUPP
RECTAL | Status: AC
  Filled 2020-02-24: qty 1

## 2020-02-24 MED ORDER — SODIUM CHLORIDE 0.9 % IV SOLN
1000.0000 mL | INTRAVENOUS | Status: DC
  Administered 2020-02-24: 1000 mL via INTRAVENOUS

## 2020-02-24 MED ORDER — SODIUM CHLORIDE 0.9% FLUSH
3.0000 mL | Freq: Two times a day (BID) | INTRAVENOUS | Status: DC
  Administered 2020-02-24 – 2020-03-19 (×33): 3 mL via INTRAVENOUS

## 2020-02-24 MED ORDER — LACTATED RINGERS IV SOLN: INTRAVENOUS | Status: DC

## 2020-02-24 MED ORDER — ONDANSETRON HCL 4 MG/2ML IJ SOLN: 4.0000 mg | Freq: Four times a day (QID) | INTRAMUSCULAR | Status: DC | PRN

## 2020-02-24 MED ORDER — MORPHINE SULFATE (PF) 2 MG/ML IV SOLN: 2.0000 mg | INTRAVENOUS | Status: DC | PRN

## 2020-02-24 MED ORDER — SUCCINYLCHOLINE CHLORIDE 200 MG/10ML IV SOSY
PREFILLED_SYRINGE | INTRAVENOUS | Status: DC | PRN
  Administered 2020-02-24: 100 mg via INTRAVENOUS

## 2020-02-24 MED ORDER — IOHEXOL 300 MG/ML  SOLN
100.0000 mL | Freq: Once | INTRAMUSCULAR | Status: AC | PRN
  Administered 2020-02-24: 100 mL via INTRAVENOUS

## 2020-02-24 MED ORDER — PHENYLEPHRINE 40 MCG/ML (10ML) SYRINGE FOR IV PUSH (FOR BLOOD PRESSURE SUPPORT)
PREFILLED_SYRINGE | INTRAVENOUS | Status: DC | PRN
  Administered 2020-02-24 (×3): 40 ug via INTRAVENOUS

## 2020-02-24 MED ORDER — ONDANSETRON HCL 4 MG PO TABS: 4.0000 mg | ORAL_TABLET | Freq: Four times a day (QID) | ORAL | Status: DC | PRN

## 2020-02-24 MED ORDER — SODIUM CHLORIDE 0.9 % IV SOLN
2.0000 g | INTRAVENOUS | Status: DC
  Administered 2020-02-24: 2 g via INTRAVENOUS
  Filled 2020-02-24: qty 20

## 2020-02-24 MED ORDER — PROPOFOL 10 MG/ML IV BOLUS
INTRAVENOUS | Status: DC | PRN
  Administered 2020-02-24: 100 mg via INTRAVENOUS

## 2020-02-24 MED ORDER — PIPERACILLIN-TAZOBACTAM 3.375 G IVPB 30 MIN
3.3750 g | Freq: Once | INTRAVENOUS | Status: AC
  Administered 2020-02-24: 3.375 g via INTRAVENOUS
  Filled 2020-02-24: qty 50

## 2020-02-24 NOTE — Anesthesia Preprocedure Evaluation (Addendum)
Anesthesia Evaluation  Patient identified by MRN, date of birth, ID band Patient awake    Reviewed: Allergy & Precautions, NPO status , Patient's Chart, lab work & pertinent test results  Airway Mallampati: III  TM Distance: >3 FB Neck ROM: Full    Dental  (+) Missing, Loose, Poor Dentition, Dental Advisory Given,    Pulmonary former smoker,  Former smoker, quit March 2020-    Pulmonary exam normal breath sounds clear to auscultation       Cardiovascular hypertension, Pt. on medications Normal cardiovascular exam Rhythm:Regular Rate:Normal     Neuro/Psych negative neurological ROS     GI/Hepatic (+) Hepatitis -Hyperbilirubinemia, suspected CBD stent   Endo/Other  Pre-diabetic  Renal/GU negative Renal ROS     Musculoskeletal negative musculoskeletal ROS (+)   Abdominal (+) + obese,   Peds  Hematology negative hematology ROS (+) hct 39.7   Anesthesia Other Findings   Reproductive/Obstetrics                            Anesthesia Physical Anesthesia Plan  ASA: III  Anesthesia Plan: General   Post-op Pain Management:    Induction: Intravenous and Rapid sequence  PONV Risk Score and Plan: 2 and Ondansetron, Dexamethasone, Midazolam and Treatment may vary due to age or medical condition  Airway Management Planned: Oral ETT  Additional Equipment: None  Intra-op Plan:   Post-operative Plan: Extubation in OR  Informed Consent: I have reviewed the patients History and Physical, chart, labs and discussed the procedure including the risks, benefits and alternatives for the proposed anesthesia with the patient or authorized representative who has indicated his/her understanding and acceptance.     Dental advisory given and Interpreter used for interveiw  Plan Discussed with: CRNA  Anesthesia Plan Comments:        Anesthesia Quick Evaluation

## 2020-02-24 NOTE — ED Notes (Signed)
Patient has been transported to ENDO

## 2020-02-24 NOTE — Progress Notes (Addendum)
Patient seen and examined. Plan for ERCP this afternoon with Dr. Paulita Fujita.  Keep patient NPO.  Full consult note to follow.  Salley Slaughter, PA-C [facility] [phone]

## 2020-02-24 NOTE — ED Notes (Signed)
Patient transported to Ultrasound 

## 2020-02-24 NOTE — Consult Note (Signed)
[facility] Consult Note  [facility] [place] 1961/04/27  [id number].    Requesting MD: Floria Raveling Chief Complaint: Acute abdominal pain Reason for Consult: New possible hepatic abscesses, increased dilatation common hepatic duct  HPI: Patient is a 59 year old male who was admitted with acute cholecystitis and underwent cholecystectomy 11/22/2019, by Dr. Georganna Skeans.  He was found to have severe cholecystitis with hydrops.  He developed a postoperative bile leak and underwent ERCP on 11/29/2019, by Dr. Clarene Essex. He was taken back to the OR on 12/07/2019 by Dr. Rolm Bookbinder with a possible bile leak.  He was found just to have biliary ascites.  He underwent diagnostic laparoscopy, evacuation of bilious ascites 3 L, and drain placement. Drain was ultimately removed on 12/23/2019.  He was scheduled for follow-up with both Dr. Grandville Silos and Dr. Watt Climes, but was unable to make these appointments.  Yesterday presented to our office as a walk-in with complaint of intense abdominal pain and swelling for about 1 week.  He reports he was fine prior to this.  He reports abdominal pain, nausea, vomiting, and intolerance to any PO intake, as well as fever and 3-4 loose stools daily. States that his urine and stool is dark. States that he feels short of breath when he lies down and has had a mild cough. Denies chest pain. Reports ongoing alcohol use up to 140 ounces of beer per day.  None in the last week due to abdominal pain.  He was referred to the ED for further evaluation and was transported by EMS. In the ED he developed some tremors and a rectal temperature of 102.4.  He was afebrile on admission, temperature is down to 100.6 at 8 AM.  Labs: Sodium 123, chloride 90, glucose 139, CO2 is 20 alk phos 241, lipase 118, AST 93, ALT 50, total bilirubin 19.3, repeat 12.5, WBC 16.0, H/H and platelets are normal.  INR 1.3, urinalysis is unremarkable.  Abdominal ultrasound shows the gallbladder  surgically absent, common bile duct was 8 mm, nodularity of the liver capsule compatible with cirrhosis no focal parenchymal abnormalities no intraductal dilatation.  Portal vein is patent on Doppler with flow towards the liver.  There is a small amount of ascites, hepatic cirrhosis, and s/p cholecystectomy.  CT scan shows new small ill-defined hypodensities inferior right hepatic lobe nonspecific but with a concern that these are abscesses.  Increased small to moderate volume ascites, unchanged biliary stent with increased dilatation of the common hepatic duct.  Patient has been seen by GI and they plan ERCP this afternoon.  We are asked to see also.  ROS: Review of Systems  Constitutional: Positive for fever.  Respiratory: Positive for cough and shortness of breath. Negative for wheezing.   Cardiovascular: Negative for chest pain and leg swelling.  Gastrointestinal: Positive for abdominal pain, diarrhea, nausea and vomiting.  Genitourinary: Negative.   Musculoskeletal: Positive for back pain.  Skin:       jaundice  Neurological: Negative.    ROS otherwise negative  Family History  Problem Relation Age of Onset  . CVA Father     Past Medical History:  Diagnosis Date  . Hepatitis   . Hyperlipidemia   . Hypertension   . Pneumonia   . Pre-diabetes     Past Surgical History:  Procedure Laterality Date  . BILIARY BRUSHING  11/29/2019   Procedure: BILIARY BRUSHING;  Surgeon: Clarene Essex, MD;  Location: [place];  Service: Endoscopy;;  . BILIARY DILATION  11/29/2019   Procedure:  BILIARY DILATION;  Surgeon: Clarene Essex, MD;  Location: [facility] ENDOSCOPY;  Service: Endoscopy;;  . BILIARY STENT PLACEMENT  11/29/2019   Procedure: BILIARY STENT PLACEMENT;  Surgeon: Clarene Essex, MD;  Location: [place];  Service: Endoscopy;;  . CHOLECYSTECTOMY N/A 11/22/2019   Procedure: LAPAROSCOPIC CHOLECYSTECTOMY;  Surgeon: Georganna Skeans, MD;  Location: [place];  Service: General;  Laterality: N/A;  .  ERCP N/A 11/29/2019   Procedure: ENDOSCOPIC RETROGRADE CHOLANGIOPANCREATOGRAPHY (ERCP);  Surgeon: Clarene Essex, MD;  Location: [place];  Service: Endoscopy;  Laterality: N/A;  . LAPAROSCOPY N/A 12/07/2019   Procedure: LAPAROSCOPY DIAGNOSTIC;  Surgeon: Rolm Bookbinder, MD;  Location: [place];  Service: General;  Laterality: N/A;  . SPHINCTEROTOMY  11/29/2019   Procedure: Joan Mayans;  Surgeon: Clarene Essex, MD;  [facility];  Service: Endoscopy;;    Social History:  reports that he quit smoking about 16 months ago. His smoking use included cigarettes. He smoked 0.30 packs per day. He has never used smokeless tobacco. He reports current alcohol use of about 2.0 standard drinks of alcohol per week. No history on file for drug use.  Allergies:  Allergies  Allergen Reactions  . Pork-Derived Products Other (See Comments)    "I have pain in my stomach if I eat this"    (Not in a hospital admission)   Blood pressure 115/69, pulse 91, temperature (!) 100.6 F (38.1 C), temperature source Oral, resp. rate (!) 28, SpO2 94 %. Physical Exam:  General: pleasant, WD, WN male who is laying in bed in NAD HEENT: head is normocephalic, atraumatic.  Sclera are noninjected.  PERRL.  Ears and nose without any masses or lesions.  Mouth is dry. +scleral icterus Heart: regular, rate, and rhythm.  Normal s1,s2. No obvious murmurs, gallops, or rubs noted.  Palpable radial and pedal pulses bilaterally Lungs: CTAB, no wheezes, rhonchi, or rales noted.  Mild tachypnea   Abd: soft, tympanic, TTP RUQ/ LLQ/ central around umbilicus without rebound or guarding. +BS, no masses or organomegaly. Small soft umbilical hernia. Well healed surgical incisions MS: all 4 extremities are symmetrical with no cyanosis, clubbing, or edema. Skin: jaundice. warm and dry with no masses, lesions, or rashes Neuro: Cranial nerves 2-12 grossly intact, sensation is normal throughout Psych: A&Ox3 with an appropriate affect.    Results for orders placed or performed during the hospital encounter of 02/23/20 (from the past 48 hour(s))  Lipase, blood     Status: Abnormal   Collection Time: 02/23/20  3:31 PM  Result Value Ref Range   Lipase 118 (H) 11 - 51 U/L    Comment: Performed at [facility], [place] [address], [place], [place] [address]  Comprehensive metabolic panel     Status: Abnormal   Collection Time: 02/23/20  3:31 PM  Result Value Ref Range   Sodium 123 (L) 135 - 145 mmol/L   Potassium 3.9 3.5 - 5.1 mmol/L   Chloride 90 (L) 98 - 111 mmol/L   CO2 20 (L) 22 - 32 mmol/L   Glucose, Bld 139 (H) 70 - 99 mg/dL    Comment: Glucose reference range applies only to samples taken after fasting for at least 8 hours.   BUN 14 6 - 20 mg/dL   Creatinine, Ser 1.10 0.61 - 1.24 mg/dL   Calcium 8.3 (L) 8.9 - 10.3 mg/dL   Total Protein 7.3 6.5 - 8.1 g/dL   Albumin 2.5 (L) 3.5 - 5.0 g/dL   AST 93 (H) 15 - 41 U/L  ALT 50 (H) 0 - 44 U/L   Alkaline Phosphatase 241 (H) 38 - 126 U/L   Total Bilirubin 19.3 (HH) 0.3 - 1.2 mg/dL    Comment: CRITICAL RESULT CALLED TO, READ BACK BY AND VERIFIED WITH: A.OLEARY RN 1622 02/23/20 MCCORMICK K    GFR calc non Af Amer >60 >60 mL/min   GFR calc Af Amer >60 >60 mL/min   Anion gap 13 5 - 15    Comment: Performed at [facility] [address], [place], [place] [address]  CBC     Status: Abnormal   Collection Time: 02/23/20  3:31 PM  Result Value Ref Range   WBC 16.0 (H) 4.0 - 10.5 K/uL   RBC 4.26 4.22 - 5.81 MIL/uL   Hemoglobin 14.2 13.0 - 17.0 g/dL   HCT 39.7 39 - 52 %   MCV 93.2 80.0 - 100.0 fL   MCH 33.3 26.0 - 34.0 pg   MCHC 35.8 30.0 - 36.0 g/dL   RDW 16.2 (H) 11.5 - 15.5 %   Platelets 249 150 - 400 K/uL   nRBC 0.0 0.0 - 0.2 %    Comment: Performed at [facility], [place] [address] [place], [place] [address]  Lactic acid, plasma     Status: Abnormal   Collection Time: 02/24/20  4:07 AM  Result Value Ref Range   Lactic Acid, Venous 2.2 (HH) 0.5 -  1.9 mmol/L    Comment: CRITICAL RESULT CALLED TO, READ BACK BY AND VERIFIED WITH: RN B OSORIO _0  02/24/20 BY S GEZAHEGN Performed at [facility], [place] [address], [place], [place] [address]   Bilirubin, direct     Status: Abnormal   Collection Time: 02/24/20  4:42 AM  Result Value Ref Range   Bilirubin, Direct 12.5 (H) 0.0 - 0.2 mg/dL    Comment: RESULTS CONFIRMED BY MANUAL DILUTION Performed at [facility] [address], [place], [place] [address]   SARS Coronavirus 2 by RT PCR (hospital order, performed in [facility] hospital lab) Nasopharyngeal Nasopharyngeal Swab     Status: None   Collection Time: 02/24/20  6:04 AM   Specimen: Nasopharyngeal Swab  Result Value Ref Range   SARS Coronavirus 2 NEGATIVE NEGATIVE    Comment: (NOTE) SARS-CoV-2 target nucleic acids are NOT DETECTED.  The SARS-CoV-2 RNA is generally detectable in upper and lower respiratory specimens during the acute phase of infection. The lowest concentration of SARS-CoV-2 viral copies this assay can detect is 250 copies / mL. A negative result does not preclude SARS-CoV-2 infection and should not be used as the sole basis for treatment or other patient management decisions.  A negative result may occur with improper specimen collection / handling, submission of specimen other than nasopharyngeal swab, presence of viral mutation(s) within the areas targeted by this assay, and inadequate number of viral copies (<250 copies / mL). A negative result must be combined with clinical observations, patient history, and epidemiological information.  Fact Sheet for Patients:   StrictlyIdeas.no  Fact Sheet for Healthcare Providers: BankingDealers.co.za  This test is not yet approved or  cleared by the [place] FDA and has been authorized for detection and/or diagnosis of SARS-CoV-2 by FDA under an Emergency Use Authorization (EUA).  This EUA will  remain in effect (meaning this test can be used) for the duration of the COVID-19 declaration under Section 564(b)(1) of the Act, 21 U.S.C. section 360bbb-3(b)(1), unless the authorization is terminated or revoked sooner.  Performed at [facility], [place] [address], [place], [place] [address]   Lactic acid, plasma     Status: None   Collection Time: 02/24/20  6:22 AM  Result Value Ref Range   Lactic Acid, Venous 1.9 0.5 - 1.9 mmol/L    Comment: Performed at [facility] [address] [place], [place] [address]   CT ABDOMEN PELVIS W CONTRAST  Result Date: 02/24/2020 CLINICAL DATA:  Generalized abdominal pain, swelling, and jaundice with vomiting and fever. Leukocytosis. EXAM: CT ABDOMEN AND PELVIS WITH CONTRAST TECHNIQUE: Multidetector CT imaging of the abdomen and pelvis was performed using the standard protocol following bolus administration of intravenous contrast. CONTRAST:  161m OMNIPAQUE IOHEXOL 300 MG/ML  SOLN COMPARISON:  12/24/2019 FINDINGS: Lower chest: Punctate calcified nodules in the right middle lobe and right lower lobe. No basilar lung consolidation or pleural effusion. Hepatobiliary: There is a new 1.2 cm hypodense lesion inferiorly in the right hepatic lobe (series 3, image 63), and there are additional more subtle subcentimeter hypodensities in the inferior right hepatic lobe which also appear new (for example series 3, images 56, 58, 59, and 62). Status post cholecystectomy. A biliary stent remains in place without significant dilatation of the common bile duct, however there is increased dilatation of the common hepatic duct about the proximal aspect of the stent measuring 2.1 cm in diameter. There is at most minimal central intrahepatic biliary dilatation. Pancreas: Unremarkable. Spleen: No focal lesion. Unchanged cleft along the posterior splenic margin. Adrenals/Urinary Tract: Unremarkable adrenal glands. No evidence of renal mass, calculi, or hydronephrosis.  Unremarkable bladder. Stomach/Bowel: The stomach is unremarkable. There is no evidence of bowel obstruction. Mildly prominent gaseous distension of the appendix without evidence of appendicitis. Vascular/Lymphatic: No significant vascular findings are present. No enlarged abdominal or pelvic lymph nodes. Reproductive: Mildly enlarged prostate. Other: Increased small to moderate volume ascites. No loculated fluid collection. No pneumoperitoneum. Small umbilical hernia containing fat and fluid. Musculoskeletal: No acute osseous abnormality or suspicious osseous lesion. IMPRESSION: 1. New small ill-defined hypodensities inferiorly in the right hepatic lobe, nonspecific though infection/small abscesses are a concern with this history. 2. Increased small to moderate volume ascites. 3. Unchanged biliary stent with increased dilatation of the common hepatic duct. 4. Aortic Atherosclerosis (ICD10-I70.0). Electronically Signed   By: ALogan BoresM.D.   On: 02/24/2020 05:42   UKoreaAbdomen Limited RUQ  Result Date: 02/24/2020 CLINICAL DATA:  Jaundice EXAM: ULTRASOUND ABDOMEN LIMITED RIGHT UPPER QUADRANT COMPARISON:  12/24/2019 FINDINGS: Gallbladder: Surgically absent Common bile duct: Diameter: 8 mm Liver: Nodularity of the liver capsule compatible with cirrhosis. No focal parenchymal abnormality. No intrahepatic duct dilation. Portal vein is patent on color Doppler imaging with normal direction of blood flow towards the liver. Other: There is a small amount of ascites throughout the abdomen, greatest in the lower quadrants. IMPRESSION: 1. Small volume ascites. 2. Findings consistent with hepatic cirrhosis. 3. Cholecystectomy. Electronically Signed   By: MRanda NgoM.D.   On: 02/24/2020 03:23   . lactated ringers    . piperacillin-tazobactam (ZOSYN)  IV        Assessment/Plan -Hx acute cholecystitis with hydrops; laparoscopic cholecystectomy with postoperative bile leak 11/22/2019 Dr. BGeorganna Skeans-CBD  stricture with ERCP with stent placement 11/29/2019 Dr. MClarene Essex-Diagnostic laparoscopy, evacuation of bilious ascites(3L), JP drain placement 12/07/2019 Dr. WDonne Hazel-Drain removal 12/23/2019 -Ongoing alcohol use  Abdominal pain, nausea, vomiting, diarrhea Jaundice, hyperbilirubinemia Increased dilatation of common hepatic duct -concerned for stent occlusion New  ill-defined hypodensities inferior right hepatic lobe -possible abscesses Cirrhosis  FEN:  NPO/IV fluids ID:  Zosyn 7/22 >> day#1 DVT: SCD's Follow up:  Dr. Henrene Dodge   Plan: Agree with medical admission. GI to do an ERCP today for evaluation of the stent. Consider paracentesis for ascites after ERCP. We will discuss hepatic hypodense lesions with IR to see if these would be amenable to drainage or aspiration. Continue IV antibiotics. We will follow with you.  Wellington Hampshire, [facility] 02/24/2020, 10:20 AM Please see Amion for pager number during day hours 7:00am-4:30pm

## 2020-02-24 NOTE — H&P (Signed)
History and Physical    Micheal Watson DDU:[id number] DOB: 09/10/1960 DOA: 02/23/2020  PCP: Patient, No Pcp Per Consultants:  [place] - GIDonne Hazel - surgery Patient coming from:  Home; NOK: Micheal Watson, [phone]  Chief Complaint: abdominal pain  HPI: Micheal Watson is a 59 y.o. male with medical history significant of ETOH dependence; recent acute cholecystitis/cholangitis s/p CBD stent and JP drain placement (removed since).  He is now presenting with recurrent abdomnial pain.  He reports 2-3 days of primarily LLQ abdominal pain.  +n/v/d.  +fever to maybe 100.  Continues to drink alcohol, but none in a week due to ongoing abdominal issues.  History obtained via teleinterpreter.   ED Course:  Ascites following prior issues.  Daily ETOH use but none in a week due to pain and jaundice.  Went to surgery clinic and sent to ER.  Fever, abdominal pain.  Abscess in RUQ, stent in place.  Bili is 19, up from 0.8.  Code sepsis, given Zosyn and Vanc, non-operative.  Review of Systems: As per HPI; otherwise review of systems reviewed and negative.   Ambulatory Status:  Ambulates without assistance    Past Medical History:  Diagnosis Date  . Hepatitis   . Hyperlipidemia   . Hypertension   . Pneumonia   . Pre-diabetes     Past Surgical History:  Procedure Laterality Date  . BILIARY BRUSHING  11/29/2019   Procedure: BILIARY BRUSHING;  Surgeon: Clarene Essex, MD;  Location: [facility] ENDOSCOPY;  Service: Endoscopy;;  . BILIARY DILATION  11/29/2019   Procedure: BILIARY DILATION;  Surgeon: Clarene Essex, MD;  Location: [facility] ENDOSCOPY;  Service: Endoscopy;;  . BILIARY STENT PLACEMENT  11/29/2019   Procedure: BILIARY STENT PLACEMENT;  Surgeon: Clarene Essex, MD;  Location: [place];  Service: Endoscopy;;  . CHOLECYSTECTOMY N/A 11/22/2019   Procedure: LAPAROSCOPIC CHOLECYSTECTOMY;  Surgeon: Georganna Skeans, MD;  Location: [place];  Service: General;  Laterality: N/A;  . ERCP N/A 11/29/2019    Procedure: ENDOSCOPIC RETROGRADE CHOLANGIOPANCREATOGRAPHY (ERCP);  Surgeon: Clarene Essex, MD;  Location: [place];  Service: Endoscopy;  Laterality: N/A;  . LAPAROSCOPY N/A 12/07/2019   Procedure: LAPAROSCOPY DIAGNOSTIC;  Surgeon: Rolm Bookbinder, MD;  Location: [place];  Service: General;  Laterality: N/A;  . SPHINCTEROTOMY  11/29/2019   Procedure: Joan Mayans;  Surgeon: Clarene Essex, MD;  Location: [facility] ENDOSCOPY;  Service: Endoscopy;;    Social History   Socioeconomic History  . Marital status: Married    Spouse name: Not on file  . Number of children: Not on file  . Years of education: Not on file  . Highest education level: Not on file  Occupational History  . Not on file  Tobacco Use  . Smoking status: Former Smoker    Packs/day: 0.30    Types: Cigarettes    Quit date: 10/08/2018    Years since quitting: 1.3  . Smokeless tobacco: Never Used  Substance and Sexual Activity  . Alcohol use: Yes    Alcohol/week: 2.0 standard drinks    Types: 2 Cans of beer per week  . Drug use: Not on file  . Sexual activity: Not on file  Other Topics Concern  . Not on file  Social History Narrative  . Not on file   Social Determinants of Health   Financial Resource Strain:   . Difficulty of Paying Living Expenses:   Food Insecurity:   . Worried About Charity fundraiser in the Last Year:   . [place] in the  Last Year:   Transportation Needs:   . Film/video editor (Medical):   Marland Kitchen Lack of Transportation (Non-Medical):   Physical Activity:   . Days of Exercise per Week:   . Minutes of Exercise per Session:   Stress:   . Feeling of Stress :   Social Connections:   . Frequency of Communication with Friends and Family:   . Frequency of Social Gatherings with Friends and Family:   . Attends Religious Services:   . Active Member of Clubs or Organizations:   . Attends Archivist Meetings:   Marland Kitchen Marital Status:   Intimate Partner Violence: Not At Risk  . Fear of  Current or Ex-Partner: No  . Emotionally Abused: No  . Physically Abused: No  . Sexually Abused: No    Allergies  Allergen Reactions  . Pork-Derived Products Other (See Comments)    "I have pain in my stomach if I eat this"    Family History  Problem Relation Age of Onset  . CVA Father     Prior to Admission medications   Medication Sig Start Date End Date Taking? Authorizing Provider  amLODipine (NORVASC) 10 MG tablet TAKE 1 TABLET BY MOUTH EVERY DAY Patient not taking: Reported on 12/24/2019 05/03/19   Kathrene Alu, MD  atorvastatin (LIPITOR) 40 MG tablet TAKE 1 TABLET (40 MG TOTAL) BY MOUTH DAILY AT 6 PM. Patient not taking: Reported on 11/22/2019 05/03/19   Kathrene Alu, MD  docusate sodium (COLACE) 100 MG capsule Take 1 capsule (100 mg total) by mouth 2 (two) times daily as needed. Patient not taking: Reported on 12/14/2019 11/23/19 11/22/20  Maczis, Barth Kirks, PA-C  ondansetron (ZOFRAN-ODT) 4 MG disintegrating tablet Take 1 tablet (4 mg total) by mouth every 6 (six) hours as needed for nausea. Patient not taking: Reported on 12/14/2019 11/23/19   Maczis, Barth Kirks, PA-C  polyethylene glycol (MIRALAX / GLYCOLAX) 17 g packet Take 17 g by mouth daily as needed. Patient not taking: Reported on 12/14/2019 11/23/19   Lorelle Gibbs    Physical Exam: Vitals:   02/24/20 1520 02/24/20 1535 02/24/20 1550 02/24/20 1605  BP: (!) 137/79 (!) 130/75 (!) 129/70 (!) 140/89  Pulse:   80 80  Resp: (!) 30 (!) 24 22 20   Temp:      TempSrc:      SpO2: 100% 100% 94% 94%  Height:         . General:  Appears calm and comfortable and is NAD; he is quite jaundiced . Eyes:  EOMI, normal lids, iris, +scleral icterus . ENT:  grossly normal hearing, lips & tongue, mmm . Neck:  no LAD, masses or thyromegaly . Cardiovascular:  RR with tachycardia, no m/r/g. No LE edema.  Marland Kitchen Respiratory:   CTA bilaterally with no wheezes/rales/rhonchi.  Normal respiratory effort. . Abdomen:  Protuberant,  + fluid wave . Back:   normal alignment, no CVAT . Skin:  no rash or induration seen on limited exam . Musculoskeletal:  grossly normal tone BUE/BLE, good ROM, no bony abnormality . Psychiatric:  grossly normal mood and affect, speech fluent and appropriate, AOx3 . Neurologic:  CN 2-12 grossly intact, moves all extremities in coordinated fashion    Radiological Exams on Admission: CT ABDOMEN PELVIS W CONTRAST  Result Date: 02/24/2020 CLINICAL DATA:  Generalized abdominal pain, swelling, and jaundice with vomiting and fever. Leukocytosis. EXAM: CT ABDOMEN AND PELVIS WITH CONTRAST TECHNIQUE: Multidetector CT imaging of the abdomen and pelvis was performed  using the standard protocol following bolus administration of intravenous contrast. CONTRAST:  172mL OMNIPAQUE IOHEXOL 300 MG/ML  SOLN COMPARISON:  12/24/2019 FINDINGS: Lower chest: Punctate calcified nodules in the right middle lobe and right lower lobe. No basilar lung consolidation or pleural effusion. Hepatobiliary: There is a new 1.2 cm hypodense lesion inferiorly in the right hepatic lobe (series 3, image 63), and there are additional more subtle subcentimeter hypodensities in the inferior right hepatic lobe which also appear new (for example series 3, images 56, 58, 59, and 62). Status post cholecystectomy. A biliary stent remains in place without significant dilatation of the common bile duct, however there is increased dilatation of the common hepatic duct about the proximal aspect of the stent measuring 2.1 cm in diameter. There is at most minimal central intrahepatic biliary dilatation. Pancreas: Unremarkable. Spleen: No focal lesion. Unchanged cleft along the posterior splenic margin. Adrenals/Urinary Tract: Unremarkable adrenal glands. No evidence of renal mass, calculi, or hydronephrosis. Unremarkable bladder. Stomach/Bowel: The stomach is unremarkable. There is no evidence of bowel obstruction. Mildly prominent gaseous distension of the  appendix without evidence of appendicitis. Vascular/Lymphatic: No significant vascular findings are present. No enlarged abdominal or pelvic lymph nodes. Reproductive: Mildly enlarged prostate. Other: Increased small to moderate volume ascites. No loculated fluid collection. No pneumoperitoneum. Small umbilical hernia containing fat and fluid. Musculoskeletal: No acute osseous abnormality or suspicious osseous lesion. IMPRESSION: 1. New small ill-defined hypodensities inferiorly in the right hepatic lobe, nonspecific though infection/small abscesses are a concern with this history. 2. Increased small to moderate volume ascites. 3. Unchanged biliary stent with increased dilatation of the common hepatic duct. 4. Aortic Atherosclerosis (ICD10-I70.0). Electronically Signed   By: Logan Bores M.D.   On: 02/24/2020 05:42   US Abdomen Limited RUQ  Result Date: 02/24/2020 CLINICAL DATA:  Jaundice EXAM: ULTRASOUND ABDOMEN LIMITED RIGHT UPPER QUADRANT COMPARISON:  12/24/2019 FINDINGS: Gallbladder: Surgically absent Common bile duct: Diameter: 8 mm Liver: Nodularity of the liver capsule compatible with cirrhosis. No focal parenchymal abnormality. No intrahepatic duct dilation. Portal vein is patent on color Doppler imaging with normal direction of blood flow towards the liver. Other: There is a small amount of ascites throughout the abdomen, greatest in the lower quadrants. IMPRESSION: 1. Small volume ascites. 2. Findings consistent with hepatic cirrhosis. 3. Cholecystectomy. Electronically Signed   By: Randa Ngo M.D.   On: 02/24/2020 03:23    EKG: Independently reviewed.  Sinus tachcyardia with rate 105; low voltage with no evidence of acute ischemia   Labs on Admission: I have personally reviewed the available labs and imaging studies at the time of the admission.  Pertinent labs:   Na++ 123 CO2 20 Glucose 139 AP 241 Albumin 2.5 Lipase 118 AST 93/ALT 50/Bili 19.3 WBC 16.0 Lactate 2.2 -> 1.9 COVID  pending    Assessment/Plan Principal Problem:   Sepsis following intra-abdominal surgery (HCC) Active Problems:   Hyperbilirubinemia   Essential hypertension   Alcohol dependence with uncomplicated withdrawal ([place])   Sepsis following intra-abdominal surgery with current hyperbilirubinemia -Patient with prior complicated hospitalization - was on the surgery service from 4/25-5/6 following previous lap chole, with concern for bile leak.  He went back to the OR for laparoscopy, evacuation of bilious ascites (3L), and JP drain placement on 5/4 -He did well since d/c but returned today with recurrent abdominal pain, n/v/d, fever, and jaundice -SIRS criteria in this patient includes: Leukocytosis, fever, tachycardia, tachypnea  -Patient has evidence of acute organ failure with elevated lactate >2 and bilirubin  elevation that is not easily explained by another condition. -While awaiting blood cultures, this appears to be a preseptic condition. -Sepsis protocol initiated -Suspected source is intraabdominal and so he was started on Zosyn -BCID has already resulted with 2/3 bottles with 3 organisms (E coli, klebsiella, and Proteus) and so pharmacy has adjusted antibiotics per algorithm to high-dose Rocephin -Will admit to progressive care unit due to:  Bacteremia -Will trend lactate to ensure improvement -GI and surgery were consulted in the ER  -GI is planning for ERCP today to assess for stent occlusion, although this may be due to portal HTN and cirrhosis with ongoing ETOH use -IR paracetensis ordered initially but GI requested d/c due to relatively small volume of ascites seen on imaging and need for more urgent ERCP today -ERCP showed a stricture with resultant CBD and CHD dilatation.  ERCP further accomplished stent removal due to partial occlusion; temporary stent placement in the CBD; and cells fr cytology obtained.  HTN -He was prescribed Amlodipine prior but has not been taking  it  HLD -He was prescribed Lipitor prior but has not been taking it  ETOH dependence -Patient with chronic ETOH dependence -His last drink was reportedly 1 week ago, but this may not be fully accurate -He is at high risk for complications of withdrawal including seizures, DTs -CIWA protocol  H/o COVID-19 infection -Patient with no current symptoms associated with prior remote infection -His test was positive in April 2020    DVT prophylaxis:  SCDs Code Status:  Full - confirmed with patient Family Communication: None present Disposition Plan:  The patient is from: home  Anticipated d/c is to: home without [facility] services   Anticipated d/c date will depend on clinical response to treatment, likely several days to a week  Patient is currently: acutely ill Consults called: GI; surgery; TOC team  Admission status: Admit - It is my clinical opinion that admission to [place] is reasonable and necessary because of the expectation that this patient will require hospital care that crosses at least 2 midnights to treat this condition based on the medical complexity of the problems presented.  Given the aforementioned information, the predictability of an adverse outcome is felt to be significant.      Karmen Bongo MD [facility]   How to contact the [facility] Attending or Consulting provider [place] or covering provider during after hours [place], for this patient?  1. Check the care team in [facility] and look for a) attending/consulting [facility] provider listed and b) the [facility] team listed 2. Log into [email or web address] and use [place]'s universal password to access. If you do not have the password, please contact the hospital operator. 3. Locate the [facility] provider you are looking for under [facility] and page to a number that you can be directly reached. 4. If you still have difficulty reaching the provider, please page the [facility] (Director on Call) for the Hospitalists listed on amion for  assistance.   02/24/2020, 6:47 PM

## 2020-02-24 NOTE — Consult Note (Signed)
Referring Provider: ED Primary Care Physician:  Patient, No Pcp Per Primary Gastroenterologist:  Dr. Watt Climes  Reason for Consultation:  hyperbilirubinemia  HPI: Micheal Watson is a 59 y.o. male with history of alcohol use, acute cholecystitis s/p cholecystectomy, common bile duct stricture s/p ERCP with stent placement 11/29/2019 presenting for consultation of hyperbilirubinemia and abdominal pain.  Patient states that over the last few days, he has had nausea, vomiting (non-bloody, non-bilious), and abdominal pain.  Abdominal pain is worst in the left lower quadrant per patient report.  He also felt feverish last night and experienced chills, though he did not take his temperature.  He has noted dark urine for approximately 4 days.  Patient also reports diarrhea for the past week with increased stool frequency (baseline stools 1-2/day). Additionally, he has noted red blood mixed with the stools for the past several days.  Also reports feeling as is if he has lost weight, but he has not weighed himself.  Denies dysphagia or GERD.  Denies family history of liver disease, colon cancer, or other gastrointestinal malignancies.  Patient has a history of alcohol dependence but states he has eliminated liquor but now drinks beer.  He still drinks one large beer daily.  No prior colonoscopy on file.   [employer] video interpretation services [phone] Rosaria Ferries utilized for this encounter.   Past Medical History:  Diagnosis Date  . Hepatitis   . Hyperlipidemia   . Hypertension   . Pneumonia   . Pre-diabetes     Past Surgical History:  Procedure Laterality Date  . BILIARY BRUSHING  11/29/2019   Procedure: BILIARY BRUSHING;  Surgeon: Clarene Essex, MD;  Location: [facility] ENDOSCOPY;  Service: Endoscopy;;  . BILIARY DILATION  11/29/2019   Procedure: BILIARY DILATION;  Surgeon: Clarene Essex, MD;  Location: [facility] ENDOSCOPY;  Service: Endoscopy;;  . BILIARY STENT PLACEMENT  11/29/2019   Procedure: BILIARY STENT  PLACEMENT;  Surgeon: Clarene Essex, MD;  Location: [place];  Service: Endoscopy;;  . CHOLECYSTECTOMY N/A 11/22/2019   Procedure: LAPAROSCOPIC CHOLECYSTECTOMY;  Surgeon: Georganna Skeans, MD;  Location: [place];  Service: General;  Laterality: N/A;  . ERCP N/A 11/29/2019   Procedure: ENDOSCOPIC RETROGRADE CHOLANGIOPANCREATOGRAPHY (ERCP);  Surgeon: Clarene Essex, MD;  Location: [place];  Service: Endoscopy;  Laterality: N/A;  . LAPAROSCOPY N/A 12/07/2019   Procedure: LAPAROSCOPY DIAGNOSTIC;  Surgeon: Rolm Bookbinder, MD;  Location: [place];  Service: General;  Laterality: N/A;  . SPHINCTEROTOMY  11/29/2019   Procedure: Joan Mayans;  Surgeon: Clarene Essex, MD;  Location: [facility] ENDOSCOPY;  Service: Endoscopy;;    Prior to Admission medications   Not on File    Scheduled Meds: . folic acid  1 mg Oral Daily  . multivitamin with minerals  1 tablet Oral Daily  . sodium chloride flush  3 mL Intravenous Q12H  . thiamine  100 mg Oral Daily   Or  . thiamine  100 mg Intravenous Daily   Continuous Infusions: . lactated ringers    . piperacillin-tazobactam (ZOSYN)  IV     PRN Meds:.LORazepam **OR** LORazepam, morphine injection, ondansetron **OR** ondansetron (ZOFRAN) IV  Allergies as of 02/23/2020 - Review Complete 02/23/2020  Allergen Reaction Noted  . Pork-derived products Other (See Comments) 04/09/2019    Family History  Problem Relation Age of Onset  . CVA Father     Social History   Socioeconomic History  . Marital status: Married    Spouse name: Not on file  . Number of children: Not on file  . Years of  education: Not on file  . Highest education level: Not on file  Occupational History  . Not on file  Tobacco Use  . Smoking status: Former Smoker    Packs/day: 0.30    Types: Cigarettes    Quit date: 10/08/2018    Years since quitting: 1.3  . Smokeless tobacco: Never Used  Substance and Sexual Activity  . Alcohol use: Yes    Alcohol/week: 2.0 standard drinks    Types: 2  Cans of beer per week  . Drug use: Not on file  . Sexual activity: Not on file  Other Topics Concern  . Not on file  Social History Narrative  . Not on file   Social Determinants of Health   Financial Resource Strain:   . Difficulty of Paying Living Expenses:   Food Insecurity:   . Worried About Charity fundraiser in the Last Year:   . Arboriculturist in the Last Year:   Transportation Needs:   . Film/video editor (Medical):   Marland Kitchen Lack of Transportation (Non-Medical):   Physical Activity:   . Days of Exercise per Week:   . Minutes of Exercise per Session:   Stress:   . Feeling of Stress :   Social Connections:   . Frequency of Communication with Friends and Family:   . Frequency of Social Gatherings with Friends and Family:   . Attends Religious Services:   . Active Member of Clubs or Organizations:   . Attends Archivist Meetings:   Marland Kitchen Marital Status:   Intimate Partner Violence: Not At Risk  . Fear of Current or Ex-Partner: No  . Emotionally Abused: No  . Physically Abused: No  . Sexually Abused: No    Review of Systems: Review of Systems  Constitutional: Positive for chills, fever (subjective), malaise/fatigue and weight loss.  HENT: Negative for hearing loss and tinnitus.   Eyes: Negative for pain and redness.  Respiratory: Negative for cough and shortness of breath.   Cardiovascular: Negative for chest pain and palpitations.  Gastrointestinal: Positive for abdominal pain, blood in stool, diarrhea, nausea and vomiting. Negative for constipation, heartburn and melena.  Genitourinary: Negative for flank pain and hematuria.       Dark urine  Musculoskeletal: Negative for falls and joint pain.  Skin: Negative for itching and rash.  Neurological: Negative for seizures and loss of consciousness.  Endo/Heme/Allergies: Negative for polydipsia. Does not bruise/bleed easily.  Psychiatric/Behavioral: Positive for substance abuse. The patient is not  nervous/anxious.      Physical Exam: Vital signs: Vitals:   02/24/20 0930 02/24/20 0945  BP: 108/64 115/69  Pulse: 88 91  Resp: (!) 25 (!) 28  Temp:    SpO2: 93% 94%     Physical Exam Vitals reviewed.  Constitutional:      General: He is not in acute distress.    Appearance: He is well-developed. He is obese. He is ill-appearing.  HENT:     Head: Normocephalic and atraumatic.     Nose: Nose normal.     Mouth/Throat:     Mouth: Mucous membranes are moist.     Pharynx: Oropharynx is clear.  Eyes:     General: Scleral icterus present.     Extraocular Movements: Extraocular movements intact.  Cardiovascular:     Rate and Rhythm: Normal rate and regular rhythm.     Heart sounds: Normal heart sounds.  Pulmonary:     Effort: Pulmonary effort is normal. No respiratory distress.  Breath sounds: Normal breath sounds.  Abdominal:     General: There is distension.     Palpations: There is no mass.     Tenderness: There is abdominal tenderness (moderate, diffuse though patient reports tenderness worst in LLQ). There is no guarding or rebound.     Hernia: No hernia is present.     Comments: Mildly firm  Musculoskeletal:        General: No swelling or tenderness.     Cervical back: Normal range of motion and neck supple.  Skin:    General: Skin is warm and dry.     Coloration: Skin is jaundiced.  Neurological:     General: No focal deficit present.     Mental Status: He is oriented to person, place, and time. He is lethargic.  Psychiatric:        Mood and Affect: Mood normal.        Behavior: Behavior normal.     GI:  Lab Results: Recent Labs    02/23/20 1531  WBC 16.0*  HGB 14.2  HCT 39.7  PLT 249   BMET Recent Labs    02/23/20 1531  NA 123*  K 3.9  CL 90*  CO2 20*  GLUCOSE 139*  BUN 14  CREATININE 1.10  CALCIUM 8.3*   LFT Recent Labs    02/23/20 1531 02/24/20 0442  PROT 7.3  --   ALBUMIN 2.5*  --   AST 93*  --   ALT 50*  --   ALKPHOS 241*   --   BILITOT 19.3*  --   BILIDIR  --  12.5*   PT/INR No results for input(s): LABPROT, INR in the last 72 hours.   Studies/Results: CT ABDOMEN PELVIS W CONTRAST  Result Date: 02/24/2020 CLINICAL DATA:  Generalized abdominal pain, swelling, and jaundice with vomiting and fever. Leukocytosis. EXAM: CT ABDOMEN AND PELVIS WITH CONTRAST TECHNIQUE: Multidetector CT imaging of the abdomen and pelvis was performed using the standard protocol following bolus administration of intravenous contrast. CONTRAST:  168mL OMNIPAQUE IOHEXOL 300 MG/ML  SOLN COMPARISON:  12/24/2019 FINDINGS: Lower chest: Punctate calcified nodules in the right middle lobe and right lower lobe. No basilar lung consolidation or pleural effusion. Hepatobiliary: There is a new 1.2 cm hypodense lesion inferiorly in the right hepatic lobe (series 3, image 63), and there are additional more subtle subcentimeter hypodensities in the inferior right hepatic lobe which also appear new (for example series 3, images 56, 58, 59, and 62). Status post cholecystectomy. A biliary stent remains in place without significant dilatation of the common bile duct, however there is increased dilatation of the common hepatic duct about the proximal aspect of the stent measuring 2.1 cm in diameter. There is at most minimal central intrahepatic biliary dilatation. Pancreas: Unremarkable. Spleen: No focal lesion. Unchanged cleft along the posterior splenic margin. Adrenals/Urinary Tract: Unremarkable adrenal glands. No evidence of renal mass, calculi, or hydronephrosis. Unremarkable bladder. Stomach/Bowel: The stomach is unremarkable. There is no evidence of bowel obstruction. Mildly prominent gaseous distension of the appendix without evidence of appendicitis. Vascular/Lymphatic: No significant vascular findings are present. No enlarged abdominal or pelvic lymph nodes. Reproductive: Mildly enlarged prostate. Other: Increased small to moderate volume ascites. No  loculated fluid collection. No pneumoperitoneum. Small umbilical hernia containing fat and fluid. Musculoskeletal: No acute osseous abnormality or suspicious osseous lesion. IMPRESSION: 1. New small ill-defined hypodensities inferiorly in the right hepatic lobe, nonspecific though infection/small abscesses are a concern with this history. 2. Increased small to  moderate volume ascites. 3. Unchanged biliary stent with increased dilatation of the common hepatic duct. 4. Aortic Atherosclerosis (ICD10-I70.0). Electronically Signed   By: Logan Bores M.D.   On: 02/24/2020 05:42   US Abdomen Limited RUQ  Result Date: 02/24/2020 CLINICAL DATA:  Jaundice EXAM: ULTRASOUND ABDOMEN LIMITED RIGHT UPPER QUADRANT COMPARISON:  12/24/2019 FINDINGS: Gallbladder: Surgically absent Common bile duct: Diameter: 8 mm Liver: Nodularity of the liver capsule compatible with cirrhosis. No focal parenchymal abnormality. No intrahepatic duct dilation. Portal vein is patent on color Doppler imaging with normal direction of blood flow towards the liver. Other: There is a small amount of ascites throughout the abdomen, greatest in the lower quadrants. IMPRESSION: 1. Small volume ascites. 2. Findings consistent with hepatic cirrhosis. 3. Cholecystectomy. Electronically Signed   By: Randa Ngo M.D.   On: 02/24/2020 03:23    Impression: Hyperbilirubinemia:direct bilirubin 12.5 today; total bili 19.5 as of 7/22.  AST 93/ ALT 50/ ALP 241.  Occluded CBD stent is suspected, though cirrhosis and alcohol use may be playing a role. -CT 02/24/20: Unchanged biliary stent with increased dilatation of the common hepatic duct.  Increased small to moderate volume ascites.  Cirrhosis per imaging: Nodularity of the liver capsule compatible with cirrhosis on [place] 02/24/20.  MELD score of 28 as of 02/23/20. -Platelets WNL.   -INR 1.1  Bloody diarrhea: no anemia, Hgb 14.2 as of 7/21  New small ill-defined hypodensities inferiorly in the right hepatic  lobe, nonspecific though infection/small abscesses are a concern with this history (per CT 02/24/20).  Surgery consulted.  Elevated WBCs and lactate  Plan: ERCP today with Dr. Paulita Fujita. I thoroughly discussed the procedure, nature, benefits, and risks (including but not limited to pancreatitis, infection, perforation, bleeding, anesthesia/pulmonary and cardiac complications). Patient verbalized consent and gave verbal consent to proceed with ERCP today.  After ERCP, consider diagnostic paracentesis (if enough ascitic fluid is present) to rule out SBP.  GI pathogen panel and C. Diff testing ordered due to bloody diarrhea.  [facility] will follow.   LOS: 0 days   Salley Slaughter  PA-C 02/24/2020, 10:17 AM  Contact #  [phone]

## 2020-02-24 NOTE — Anesthesia Procedure Notes (Signed)
Procedure Name: Intubation Date/Time: 02/24/2020 2:04 PM Performed by: Genelle Bal, CRNA Pre-anesthesia Checklist: Patient identified, Emergency Drugs available, Suction available and Patient being monitored Patient Re-evaluated:Patient Re-evaluated prior to induction Oxygen Delivery Method: Circle system utilized Preoxygenation: Pre-oxygenation with 100% oxygen Induction Type: IV induction and Rapid sequence Laryngoscope Size: Miller and 2 Grade View: Grade I Tube type: Oral Tube size: 7.5 mm Number of attempts: 1 Airway Equipment and Method: Stylet and Oral airway Placement Confirmation: ETT inserted through vocal cords under direct vision,  positive ETCO2 and breath sounds checked- equal and bilateral Secured at: 22 cm Tube secured with: Tape Dental Injury: Teeth and Oropharynx as per pre-operative assessment

## 2020-02-24 NOTE — Transfer of Care (Signed)
Immediate Anesthesia Transfer of Care Note  Patient: Micheal Watson  Procedure(s) Performed: ENDOSCOPIC RETROGRADE CHOLANGIOPANCREATOGRAPHY (ERCP) (N/A ) STENT REMOVAL BILIARY STENT PLACEMENT BILIARY BRUSHING  Patient Location: Endoscopy Unit  Anesthesia Type:General  Level of Consciousness: awake, alert  and oriented  Airway & Oxygen Therapy: Patient Spontanous Breathing and Patient connected to face mask oxygen  Post-op Assessment: Report given to RN and Post -op Vital signs reviewed and stable  Post vital signs: Reviewed and stable  Last Vitals:  Vitals Value Taken Time  BP 122/78   Temp    Pulse 92   Resp 26   SpO2 96%     Last Pain:  Vitals:   02/24/20 1333  TempSrc: Oral  PainSc: 5       Patients Stated Pain Goal: 0 (65/46/50 3546)  Complications: No complications documented.

## 2020-02-24 NOTE — ED Provider Notes (Addendum)
[facility] EMERGENCY DEPARTMENT Provider Note  CSN: [id number] Arrival date & time: 02/23/20 1515  Chief Complaint(s) Abdominal Pain and Jaundice  HPI Micheal Watson is a 59 y.o. male with a past medical history listed below including alcoholism, recent acute cholecystitis complicated by biliary cystitis status post common bile duct stenting and JP drain that was subsequently removed who presents to the emergency department from surgery clinic.  Patient reports that he went to the clinic because he has been having several days of gradually worsening abdominal pain that was initially intermittent and now constant for the past 2 days.  Patient also developed yellowing of his skin and eyes.  He has been having some nausea with nonbloody nonbilious emesis.  Patient also reports mild diarrhea.  Patient endorses continued alcohol use stating that he drinks 140 ounce beer per day.  Reports that he has not drank any alcohol in the last week due to his abdominal pain.    In the clinic patient was noted to be febrile and tachycardic, prompting his ED visit.  HPI  Past Medical History Past Medical History:  Diagnosis Date  . Hepatitis   . Hyperlipidemia   . Hypertension   . Pneumonia   . Pre-diabetes    Patient Active Problem List   Diagnosis Date Noted  . Abdominal pain 12/24/2019  . Essential hypertension 12/15/2019  . Bile leak, postoperative 11/28/2019  . Acute cholecystitis without calculus 11/21/2019  . Protein-calorie malnutrition ([place]) 04/11/2019  . Hyperammonemia ([place]) 04/09/2019  . Elevated LFTs   . Gynecomastia, male   . Hyperbilirubinemia   . Tremor of unknown origin   . COVID-19 virus infection 11/27/2018  . Acute respiratory disease due to COVID-19 virus 11/27/2018  . SIRS (systemic inflammatory response syndrome) ([place]) 11/25/2018  . Acute on chronic respiratory failure with hypoxia ([place]) 11/21/2018  . Suspected COVID-19 virus infection 11/17/2018     Home Medication(s) Prior to Admission medications   Medication Sig Start Date End Date Taking? Authorizing Provider  amLODipine (NORVASC) 10 MG tablet TAKE 1 TABLET BY MOUTH EVERY DAY Patient not taking: Reported on 12/24/2019 05/03/19   Kathrene Alu, MD  atorvastatin (LIPITOR) 40 MG tablet TAKE 1 TABLET (40 MG TOTAL) BY MOUTH DAILY AT 6 PM. Patient not taking: Reported on 11/22/2019 05/03/19   Kathrene Alu, MD  docusate sodium (COLACE) 100 MG capsule Take 1 capsule (100 mg total) by mouth 2 (two) times daily as needed. Patient not taking: Reported on 12/14/2019 11/23/19 11/22/20  Maczis, Barth Kirks, PA-C  ondansetron (ZOFRAN-ODT) 4 MG disintegrating tablet Take 1 tablet (4 mg total) by mouth every 6 (six) hours as needed for nausea. Patient not taking: Reported on 12/14/2019 11/23/19   Maczis, Barth Kirks, PA-C  polyethylene glycol (MIRALAX / GLYCOLAX) 17 g packet Take 17 g by mouth daily as needed. Patient not taking: Reported on 12/14/2019 11/23/19   Jillyn Ledger, PA-C  Past Surgical History Past Surgical History:  Procedure Laterality Date  . BILIARY BRUSHING  11/29/2019   Procedure: BILIARY BRUSHING;  Surgeon: Clarene Essex, MD;  Location: [facility] ENDOSCOPY;  Service: Endoscopy;;  . BILIARY DILATION  11/29/2019   Procedure: BILIARY DILATION;  Surgeon: Clarene Essex, MD;  Location: [facility] ENDOSCOPY;  Service: Endoscopy;;  . BILIARY STENT PLACEMENT  11/29/2019   Procedure: BILIARY STENT PLACEMENT;  Surgeon: Clarene Essex, MD;  Location: [facility];  Service: Endoscopy;;  . CHOLECYSTECTOMY N/A 11/22/2019   Procedure: LAPAROSCOPIC CHOLECYSTECTOMY;  Surgeon: Georganna Skeans, MD;  Location: [facility];  Service: General;  Laterality: N/A;  . ERCP N/A 11/29/2019   Procedure: ENDOSCOPIC RETROGRADE CHOLANGIOPANCREATOGRAPHY (ERCP);  Surgeon: Clarene Essex, MD;  Location: [facility];   Service: Endoscopy;  Laterality: N/A;  . LAPAROSCOPY N/A 12/07/2019   Procedure: LAPAROSCOPY DIAGNOSTIC;  Surgeon: Rolm Bookbinder, MD;  Location: [place];  Service: General;  Laterality: N/A;  . SPHINCTEROTOMY  11/29/2019   Procedure: Joan Mayans;  Surgeon: Clarene Essex, MD;  Location: [facility] ENDOSCOPY;  Service: Endoscopy;;   Family History Family History  Problem Relation Age of Onset  . CVA Father     Social History Social History   Tobacco Use  . Smoking status: Former Smoker    Packs/day: 0.30    Types: Cigarettes    Quit date: 10/08/2018    Years since quitting: 1.3  . Smokeless tobacco: Never Used  Substance Use Topics  . Alcohol use: Yes    Alcohol/week: 2.0 standard drinks    Types: 2 Cans of beer per week  . Drug use: Not on file   Allergies Pork-derived products  Review of Systems Review of Systems All other systems are reviewed and are negative for acute change except as noted in the HPI  Physical Exam Vital Signs  I have reviewed the triage vital signs BP 109/64   Pulse (!) 106   Temp (!) 102.4 F (39.1 C) (Rectal)   Resp (!) 28   SpO2 92%   Physical Exam Vitals reviewed.  Constitutional:      General: He is not in acute distress.    Appearance: He is well-developed. He is not diaphoretic.  HENT:     Head: Normocephalic and atraumatic.     Nose: Nose normal.  Eyes:     General: Scleral icterus present.        Right eye: No discharge.        Left eye: No discharge.     Conjunctiva/sclera: Conjunctivae normal.     Pupils: Pupils are equal, round, and reactive to light.  Cardiovascular:     Rate and Rhythm: Normal rate and regular rhythm.     Heart sounds: No murmur heard.  No friction rub. No gallop.   Pulmonary:     Effort: Pulmonary effort is normal. No respiratory distress.     Breath sounds: Normal breath sounds. No stridor. No rales.  Abdominal:     General: There is distension.     Palpations: Abdomen is soft.     Tenderness: There is  abdominal tenderness (mild discomfort). There is no guarding or rebound.  Musculoskeletal:        General: No tenderness.     Cervical back: Normal range of motion and neck supple.  Skin:    General: Skin is warm and dry.     Coloration: Skin is jaundiced.     Findings: No erythema or rash.     Comments: Spider hemangiomas to torso  Neurological:  Mental Status: He is alert and oriented to person, place, and time.     ED Results and Treatments Labs (all labs ordered are listed, but only abnormal results are displayed) Labs Reviewed  LIPASE, BLOOD - Abnormal; Notable for the following components:      Result Value   Lipase 118 (*)    All other components within normal limits  COMPREHENSIVE METABOLIC PANEL - Abnormal; Notable for the following components:   Sodium 123 (*)    Chloride 90 (*)    CO2 20 (*)    Glucose, Bld 139 (*)    Calcium 8.3 (*)    Albumin 2.5 (*)    AST 93 (*)    ALT 50 (*)    Alkaline Phosphatase 241 (*)    Total Bilirubin 19.3 (*)    All other components within normal limits  CBC - Abnormal; Notable for the following components:   WBC 16.0 (*)    RDW 16.2 (*)    All other components within normal limits  LACTIC ACID, PLASMA - Abnormal; Notable for the following components:   Lactic Acid, Venous 2.2 (*)    All other components within normal limits  CULTURE, BLOOD (ROUTINE X 2)  CULTURE, BLOOD (ROUTINE X 2)  SARS CORONAVIRUS 2 BY RT PCR (HOSPITAL ORDER, [facility])  URINALYSIS, ROUTINE W REFLEX MICROSCOPIC  BILIRUBIN, DIRECT  LACTIC ACID, PLASMA  LACTIC ACID, PLASMA                                                                                                                         EKG     Radiology CT ABDOMEN PELVIS W CONTRAST  Result Date: 02/24/2020 CLINICAL DATA:  Generalized abdominal pain, swelling, and jaundice with vomiting and fever. Leukocytosis. EXAM: CT ABDOMEN AND PELVIS WITH CONTRAST TECHNIQUE:  Multidetector CT imaging of the abdomen and pelvis was performed using the standard protocol following bolus administration of intravenous contrast. CONTRAST:  127mL OMNIPAQUE IOHEXOL 300 MG/ML  SOLN COMPARISON:  12/24/2019 FINDINGS: Lower chest: Punctate calcified nodules in the right middle lobe and right lower lobe. No basilar lung consolidation or pleural effusion. Hepatobiliary: There is a new 1.2 cm hypodense lesion inferiorly in the right hepatic lobe (series 3, image 63), and there are additional more subtle subcentimeter hypodensities in the inferior right hepatic lobe which also appear new (for example series 3, images 56, 58, 59, and 62). Status post cholecystectomy. A biliary stent remains in place without significant dilatation of the common bile duct, however there is increased dilatation of the common hepatic duct about the proximal aspect of the stent measuring 2.1 cm in diameter. There is at most minimal central intrahepatic biliary dilatation. Pancreas: Unremarkable. Spleen: No focal lesion. Unchanged cleft along the posterior splenic margin. Adrenals/Urinary Tract: Unremarkable adrenal glands. No evidence of renal mass, calculi, or hydronephrosis. Unremarkable bladder. Stomach/Bowel: The stomach is unremarkable. There is no evidence of bowel obstruction. Mildly prominent gaseous distension of the  appendix without evidence of appendicitis. Vascular/Lymphatic: No significant vascular findings are present. No enlarged abdominal or pelvic lymph nodes. Reproductive: Mildly enlarged prostate. Other: Increased small to moderate volume ascites. No loculated fluid collection. No pneumoperitoneum. Small umbilical hernia containing fat and fluid. Musculoskeletal: No acute osseous abnormality or suspicious osseous lesion. IMPRESSION: 1. New small ill-defined hypodensities inferiorly in the right hepatic lobe, nonspecific though infection/small abscesses are a concern with this history. 2. Increased small to  moderate volume ascites. 3. Unchanged biliary stent with increased dilatation of the common hepatic duct. 4. Aortic Atherosclerosis (ICD10-I70.0). Electronically Signed   By: Logan Bores M.D.   On: 02/24/2020 05:42   US Abdomen Limited RUQ  Result Date: 02/24/2020 CLINICAL DATA:  Jaundice EXAM: ULTRASOUND ABDOMEN LIMITED RIGHT UPPER QUADRANT COMPARISON:  12/24/2019 FINDINGS: Gallbladder: Surgically absent Common bile duct: Diameter: 8 mm Liver: Nodularity of the liver capsule compatible with cirrhosis. No focal parenchymal abnormality. No intrahepatic duct dilation. Portal vein is patent on color Doppler imaging with normal direction of blood flow towards the liver. Other: There is a small amount of ascites throughout the abdomen, greatest in the lower quadrants. IMPRESSION: 1. Small volume ascites. 2. Findings consistent with hepatic cirrhosis. 3. Cholecystectomy. Electronically Signed   By: Randa Ngo M.D.   On: 02/24/2020 03:23    Pertinent labs & imaging results that were available during my care of the patient were reviewed by me and considered in my medical decision making (see chart for details).  Medications Ordered in ED Medications  sodium chloride flush (NS) 0.9 % injection 3 mL (has no administration in time range)  piperacillin-tazobactam (ZOSYN) IVPB 3.375 g (0 g Intravenous Stopped 02/24/20 0535)  iohexol (OMNIPAQUE) 300 MG/ML solution 100 mL (100 mLs Intravenous Contrast Given 02/24/20 0426)                                                                                                                                    Procedures .Critical Care Performed by: Fatima Blank, MD Authorized by: Fatima Blank, MD    CRITICAL CARE Performed by: Grayce Sessions Kylii Ennis Total critical care time: 60 minutes Critical care time was exclusive of separately billable procedures and treating other patients. Critical care was necessary to treat or prevent imminent or  life-threatening deterioration. Critical care was time spent personally by me on the following activities: development of treatment plan with patient and/or surrogate as well as nursing, discussions with consultants, evaluation of patient's response to treatment, examination of patient, obtaining history from patient or surrogate, ordering and performing treatments and interventions, ordering and review of laboratory studies, ordering and review of radiographic studies, pulse oximetry and re-evaluation of patient's condition.    (including critical care time)  Medical Decision Making / ED Course I have reviewed the nursing notes for this encounter and the patient's prior records (if available in EHR or on provided paperwork).   Micheal Watson was  evaluated in Emergency Department on 02/24/2020 for the symptoms described in the history of present illness. He was evaluated in the context of the global COVID-19 pandemic, which necessitated consideration that the patient might be at risk for infection with the SARS-CoV-2 virus that causes COVID-19. Institutional protocols and algorithms that pertain to the evaluation of patients at risk for COVID-19 are in a state of rapid change based on information released by regulatory bodies including the CDC and federal and state organizations. These policies and algorithms were followed during the patient's care in the ED.  Work-up notable for small intra-abdominal abscess.  Patient with leukocytosis and elevated lactic acid.  Code sepsis was initiated and patient was started on empiric antibiotics.  Did not require 30 cc/kg of IV fluids.  Also noted to have elevated LFTs and total bilirubin of 19.3 which is up from 0.82 months ago.   Will admit to medicine for further work-up and management.     Final Clinical Impression(s) / ED Diagnoses Final diagnoses:  Jaundice  Intra-abdominal abscess ([place])  Hyperbilirubinemia      This chart was dictated  using voice recognition software.  Despite best efforts to proofread,  errors can occur which can change the documentation meaning.     Fatima Blank, MD 02/24/20 [phone]

## 2020-02-24 NOTE — Progress Notes (Signed)
PHARMACY - PHYSICIAN COMMUNICATION CRITICAL VALUE ALERT - BLOOD CULTURE IDENTIFICATION (BCID)  Micheal Watson is an 59 y.o. male who presented to [facility] on 02/23/2020 with a chief complaint of   Assessment:  Pt with a recent hx of complicated cholecystectomy (bile leak>>ERCP>>ex lap on 5/4). Drain was removed on 5/21. He was admitted for fevers and chills along with possible stent occlusion and hepatic abscess. Lab called with BCID result today>>2/3 bottles positive for e.coli, proteus, and klebsiella.  We will optimize his abx to high dose Ceftriaxone  Name of physician (or Provider) Contacted: Dr Lorin Mercy  Current antibiotics: Zosyn   Changes to prescribed antibiotics recommended:  Change zosyn to Ceftriaxone 2g IV q24  Results for orders placed or performed during the hospital encounter of 02/23/20  Blood Culture ID Panel (Reflexed) (Collected: 02/24/2020  2:47 AM)  Result Value Ref Range   Enterococcus species NOT DETECTED NOT DETECTED   Listeria monocytogenes NOT DETECTED NOT DETECTED   Staphylococcus species NOT DETECTED NOT DETECTED   Staphylococcus aureus (BCID) NOT DETECTED NOT DETECTED   Streptococcus species NOT DETECTED NOT DETECTED   Streptococcus agalactiae NOT DETECTED NOT DETECTED   Streptococcus pneumoniae NOT DETECTED NOT DETECTED   Streptococcus pyogenes NOT DETECTED NOT DETECTED   Acinetobacter baumannii NOT DETECTED NOT DETECTED   Enterobacteriaceae species DETECTED (A) NOT DETECTED   Enterobacter cloacae complex NOT DETECTED NOT DETECTED   Escherichia coli DETECTED (A) NOT DETECTED   Klebsiella oxytoca NOT DETECTED NOT DETECTED   Klebsiella pneumoniae DETECTED (A) NOT DETECTED   Proteus species DETECTED (A) NOT DETECTED   Serratia marcescens NOT DETECTED NOT DETECTED   Carbapenem resistance NOT DETECTED NOT DETECTED   Haemophilus influenzae NOT DETECTED NOT DETECTED   Neisseria meningitidis NOT DETECTED NOT DETECTED   Pseudomonas aeruginosa NOT  DETECTED NOT DETECTED   Candida albicans NOT DETECTED NOT DETECTED   Candida glabrata NOT DETECTED NOT DETECTED   Candida krusei NOT DETECTED NOT DETECTED   Candida parapsilosis NOT DETECTED NOT DETECTED   Candida tropicalis NOT DETECTED NOT DETECTED    Onnie Boer, PharmD, BCIDP, AAHIVP, CPP Infectious Disease Pharmacist 02/24/2020 5:23 PM

## 2020-02-24 NOTE — Op Note (Signed)
[facility] Patient Name: Micheal Watson Procedure Date : 02/24/2020 MRN: [id number] Attending MD: Arta Silence , MD Date of Birth: 05/19/1961 CSN: [id number] Age: 59 Admit Type: Inpatient Procedure:                ERCP Indications:              Generalized abdominal pain, Jaundice, Elevated                            liver enzymes, Abnormal CT of the GI tract Providers:                Arta Silence, MD, Benetta Spar RN, RN, Cletis Athens, Technician Referring MD:             [facility] Medicines:                General Anesthesia, Zosyn 3.375 g, Indomethacin 100                            mg PR, Glucagon 0.5 mg IV Complications:            No immediate complications. Estimated Blood Loss:     Estimated blood loss: none. Procedure:                Pre-Anesthesia Assessment:                           - Prior to the procedure, a History and Physical                            was performed, and patient medications and                            allergies were reviewed. The patient's tolerance of                            previous anesthesia was also reviewed. The risks                            and benefits of the procedure and the sedation                            options and risks were discussed with the patient.                            All questions were answered, and informed consent                            was obtained. Prior Anticoagulants: The patient has                            taken no previous anticoagulant or antiplatelet  agents. ASA Grade Assessment: III - A patient with                            severe systemic disease. After reviewing the risks                            and benefits, the patient was deemed in                            satisfactory condition to undergo the procedure.                           After obtaining informed consent, the scope was                             passed under direct vision. Throughout the                            procedure, the patient's blood pressure, pulse, and                            oxygen saturations were monitored continuously. The                            [id number]-[id number] ([id number] Olympus Duodensocope was                            introduced through the mouth, and used to inject                            contrast into and used to cannulate the bile duct.                            The ERCP was accomplished without difficulty. The                            patient tolerated the procedure well. Scope In: Scope Out: Findings:      A biliary stent was visible on the scout film. One stent was removed       from the biliary tree using a snare and sent for cytology. The stent was       found to be partially occluded. The bile duct was deeply cannulated.       Contrast was injected. I personally interpreted the bile duct images.       Ductal flow of contrast was adequate. Prior cholecystectomy. No evidence       of bile leak. The upper third of the main bile duct and common hepatic       duct were moderately dilated, secondary to a stricture at junction of       CBD/CHD with upstream biliary ductal dilatation, the largest diameter       was 15 mm. Cells for cytology were obtained by brushing in the upper       third of the main bile duct. One 10 Fr by 7 cm temporary stent with a  single external flap and a single internal flap was placed 7 cm into the       common bile duct. Bile flowed through the stent. The stent was in good       position. Pancreatogram was not obtained, intentionally. Impression:               - The upper third of the main bile duct and common                            hepatic duct were moderately dilated, secondary to                            a stricture.                           - One stent was removed from the biliary tree.                           - Cells for cytology obtained in the  upper third of                            the main bile duct.                           - One temporary stent was placed into the common                            bile duct. Moderate Sedation:      None Recommendation:           - Avoid aspirin and nonsteroidal anti-inflammatory                            medicines for 3 days.                           - Watch for pancreatitis, bleeding, perforation,                            and cholangitis.                           - Observe patient's clinical course.                           - Continue antibiotics.                           - May need paracentesis, pending clinical course.                           - May need EUS/cholangioscopy, down-the-road,                            pending clinical course.                           - Alcohol  cessation.                           - Clear liquid diet today.                           [name] will follow. Procedure Code(s):        --- Professional ---                           [phone], Endoscopic retrograde                            cholangiopancreatography (ERCP); with removal and                            exchange of stent(s), biliary or pancreatic duct,                            including pre- and post-dilation and guide wire                            passage, when performed, including sphincterotomy,                            when performed, each stent exchanged Diagnosis Code(s):        --- Professional ---                           K83.1, Obstruction of bile duct                           Z46.59, Encounter for fitting and adjustment of                            other gastrointestinal appliance and device                           R10.84, Generalized abdominal pain                           R17, Unspecified jaundice                           R74.8, Abnormal levels of other serum enzymes                           R93.3, Abnormal findings on diagnostic imaging of                             other parts of digestive tract CPT copyright 2019 American Medical Association. All rights reserved. The codes documented in this report are preliminary and upon coder review may  be revised to meet current compliance requirements. Arta Silence, MD 02/24/2020 3:32:40 PM This report has been signed electronically. Number of Addenda: 0

## 2020-02-24 NOTE — ED Notes (Signed)
Pt experiencing tremors. Rectal Temp 102.4 F.   Dr. Leonette Monarch informed. No new orders at this time

## 2020-02-24 NOTE — Anesthesia Postprocedure Evaluation (Signed)
Anesthesia Post Note  Patient: Micheal Watson  Procedure(s) Performed: ENDOSCOPIC RETROGRADE CHOLANGIOPANCREATOGRAPHY (ERCP) (N/A ) STENT REMOVAL BILIARY STENT PLACEMENT BILIARY BRUSHING     Patient location during evaluation: PACU Anesthesia Type: General Level of consciousness: awake and alert, oriented and patient cooperative Pain management: pain level controlled Vital Signs Assessment: post-procedure vital signs reviewed and stable Respiratory status: spontaneous breathing, nonlabored ventilation and respiratory function stable Cardiovascular status: blood pressure returned to baseline and stable Postop Assessment: no apparent nausea or vomiting Anesthetic complications: no   No complications documented.  Last Vitals:  Vitals:   02/24/20 1520 02/24/20 1535  BP: (!) 137/79 (!) 130/75  Pulse:    Resp: (!) 30 (!) 24  Temp:    SpO2: 100% 100%    Last Pain:  Vitals:   02/24/20 1535  TempSrc:   PainSc: 0-No pain                 Pervis Hocking

## 2020-02-25 ENCOUNTER — Encounter (HOSPITAL_COMMUNITY): Payer: Self-pay | Admitting: Gastroenterology

## 2020-02-25 ENCOUNTER — Inpatient Hospital Stay (HOSPITAL_COMMUNITY): Payer: Self-pay

## 2020-02-25 HISTORY — PX: IR PARACENTESIS: IMG2679

## 2020-02-25 LAB — GASTROINTESTINAL PANEL BY PCR, STOOL (REPLACES STOOL CULTURE)

## 2020-02-25 LAB — GRAM STAIN

## 2020-02-25 LAB — COMPREHENSIVE METABOLIC PANEL
ALT: 39 U/L (ref 0–44)
AST: 48 U/L — ABNORMAL HIGH (ref 15–41)
Albumin: 1.9 g/dL — ABNORMAL LOW (ref 3.5–5.0)
Alkaline Phosphatase: 177 U/L — ABNORMAL HIGH (ref 38–126)
Anion gap: 12 (ref 5–15)
BUN: 29 mg/dL — ABNORMAL HIGH (ref 6–20)
CO2: 19 mmol/L — ABNORMAL LOW (ref 22–32)
Calcium: 7.7 mg/dL — ABNORMAL LOW (ref 8.9–10.3)
Chloride: 100 mmol/L (ref 98–111)
Creatinine, Ser: 1.16 mg/dL (ref 0.61–1.24)
GFR calc Af Amer: >60 mL/min (ref >60)
GFR calc non Af Amer: >60 mL/min (ref >60)
Glucose, Bld: 247 mg/dL — ABNORMAL HIGH (ref 70–99)
Potassium: 3.5 mmol/L (ref 3.5–5.1)
Sodium: 131 mmol/L — ABNORMAL LOW (ref 135–145)
Total Bilirubin: 9.5 mg/dL — ABNORMAL HIGH (ref 0.3–1.2)
Total Protein: 6.5 g/dL (ref 6.5–8.1)

## 2020-02-25 LAB — BODY FLUID CELL COUNT WITH DIFFERENTIAL
Lymphs, Fluid: 7 %
Monocyte-Macrophage-Serous Fluid: 1 % — ABNORMAL LOW (ref 50–90)
Neutrophil Count, Fluid: 92 % — ABNORMAL HIGH (ref 0–25)
Total Nucleated Cell Count, Fluid: 506 cu mm (ref 0–1000)

## 2020-02-25 LAB — TYPE AND SCREEN
ABO/RH(D): A POS
Antibody Screen: NEGATIVE

## 2020-02-25 LAB — PROTIME-INR
INR: 1.3 — ABNORMAL HIGH (ref 0.8–1.2)
Prothrombin Time: 15.6 seconds — ABNORMAL HIGH (ref 11.4–15.2)

## 2020-02-25 LAB — CBC
HCT: 38.3 % — ABNORMAL LOW (ref 39.0–52.0)
Hemoglobin: 13.8 g/dL (ref 13.0–17.0)
MCH: 32.5 pg (ref 26.0–34.0)
MCHC: 36 g/dL (ref 30.0–36.0)
MCV: 90.3 fL (ref 80.0–100.0)
Platelets: 226 10*3/uL (ref 150–400)
RBC: 4.24 MIL/uL (ref 4.22–5.81)
RDW: 16.3 % — ABNORMAL HIGH (ref 11.5–15.5)
WBC: 13 10*3/uL — ABNORMAL HIGH (ref 4.0–10.5)
nRBC: 0 % (ref 0.0–0.2)

## 2020-02-25 LAB — ALBUMIN, PLEURAL OR PERITONEAL FLUID: Albumin, Fluid: 1.5 g/dL

## 2020-02-25 LAB — PROCALCITONIN: Procalcitonin: 7.71 ng/mL

## 2020-02-25 LAB — C DIFFICILE QUICK SCREEN W PCR REFLEX
C Diff antigen: NEGATIVE
C Diff interpretation: NOT DETECTED
C Diff toxin: NEGATIVE

## 2020-02-25 LAB — MRSA PCR SCREENING: MRSA by PCR: NEGATIVE

## 2020-02-25 LAB — PROTEIN, PLEURAL OR PERITONEAL FLUID: Total protein, fluid: 3.7 g/dL

## 2020-02-25 LAB — ABO/RH: ABO/RH(D): A POS

## 2020-02-25 LAB — C-REACTIVE PROTEIN: CRP: 18 mg/dL — ABNORMAL HIGH (ref <1.0)

## 2020-02-25 LAB — BRAIN NATRIURETIC PEPTIDE: B Natriuretic Peptide: 380.8 pg/mL — ABNORMAL HIGH (ref 0.0–100.0)

## 2020-02-25 LAB — MAGNESIUM: Magnesium: 2.5 mg/dL — ABNORMAL HIGH (ref 1.7–2.4)

## 2020-02-25 LAB — CYTOLOGY - NON PAP

## 2020-02-25 LAB — LACTIC ACID, PLASMA: Lactic Acid, Venous: 3.1 mmol/L (ref 0.5–1.9)

## 2020-02-25 MED ORDER — PANTOPRAZOLE SODIUM 40 MG PO TBEC
40.0000 mg | DELAYED_RELEASE_TABLET | Freq: Every day | ORAL | Status: DC
  Administered 2020-02-25 – 2020-03-20 (×25): 40 mg via ORAL
  Filled 2020-02-25 (×25): qty 1

## 2020-02-25 MED ORDER — LIDOCAINE HCL 1 % IJ SOLN
INTRAMUSCULAR | Status: AC
  Filled 2020-02-25: qty 20

## 2020-02-25 MED ORDER — LIDOCAINE HCL 1 % IJ SOLN
INTRAMUSCULAR | Status: DC | PRN
  Administered 2020-02-25: 10 mL

## 2020-02-25 MED ORDER — HEPARIN SODIUM (PORCINE) 5000 UNIT/ML IJ SOLN
5000.0000 [IU] | Freq: Three times a day (TID) | INTRAMUSCULAR | Status: DC
  Administered 2020-02-25 – 2020-03-02 (×17): 5000 [IU] via SUBCUTANEOUS
  Filled 2020-02-25 (×17): qty 1

## 2020-02-25 MED ORDER — POTASSIUM CHLORIDE CRYS ER 20 MEQ PO TBCR
40.0000 meq | EXTENDED_RELEASE_TABLET | Freq: Once | ORAL | Status: AC
  Administered 2020-02-25: 40 meq via ORAL
  Filled 2020-02-25: qty 2

## 2020-02-25 MED ORDER — MIDODRINE HCL 5 MG PO TABS
10.0000 mg | ORAL_TABLET | Freq: Three times a day (TID) | ORAL | Status: DC
  Administered 2020-02-25 – 2020-02-26 (×4): 10 mg via ORAL
  Filled 2020-02-25 (×4): qty 2

## 2020-02-25 MED ORDER — CHLORDIAZEPOXIDE HCL 5 MG PO CAPS
15.0000 mg | ORAL_CAPSULE | Freq: Three times a day (TID) | ORAL | Status: DC
  Administered 2020-02-25 – 2020-02-27 (×6): 15 mg via ORAL
  Filled 2020-02-25 (×6): qty 3

## 2020-02-25 MED ORDER — METRONIDAZOLE IN NACL 5-0.79 MG/ML-% IV SOLN
500.0000 mg | Freq: Three times a day (TID) | INTRAVENOUS | Status: DC
  Administered 2020-02-25 – 2020-03-03 (×21): 500 mg via INTRAVENOUS
  Filled 2020-02-25 (×21): qty 100

## 2020-02-25 MED ORDER — ALBUMIN HUMAN 5 % IV SOLN
25.0000 g | Freq: Once | INTRAVENOUS | Status: AC
  Administered 2020-02-25: 25 g via INTRAVENOUS
  Filled 2020-02-25: qty 500

## 2020-02-25 MED ORDER — LACTATED RINGERS IV SOLN: INTRAVENOUS | Status: DC

## 2020-02-25 NOTE — Evaluation (Signed)
Physical Therapy Evaluation Patient Details Name: Micheal Watson MRN: [id number] DOB: Oct 03, 1960 Today's Date: 02/25/2020   History of Present Illness  Micheal Watson is a 59 y.o. male with medical history significant of ETOH dependence; recent acute cholecystitis/cholangitis s/p lap chole w/CBD stent and JP drain placement 4-12/2019 (removed since).  He is now presenting with recurrent LLQ abdomnial pain.  Now s/p ERCP 7/22 & paracentesis 7/23.  Clinical Impression  Patient presents with decreased mobility due to c/o dizziness and some imbalance with mobility.  Previously living with roomates and working intermittently, he denies falls and walks today with minguard A.  Feel he will benefit from skilled PT in the acute setting prior to d/c home likely without follow up PT needs.     Follow Up Recommendations No PT follow up    Equipment Recommendations  None recommended by PT    Recommendations for Other Services       Precautions / Restrictions Precautions Precautions: Fall      Mobility  Bed Mobility Overal bed mobility: Needs Assistance Bed Mobility: Rolling;Sidelying to Sit Rolling: Supervision Sidelying to sit: Supervision       General bed mobility comments: cues for technique  Transfers Overall transfer level: Needs assistance Equipment used: None Transfers: Sit to/from Stand Sit to Stand: Supervision         General transfer comment: assist for lines/safety/balance as c/o dizziness  Ambulation/Gait Ambulation/Gait assistance: Min guard Gait Distance (Feet): 200 Feet Assistive device: None Gait Pattern/deviations: Step-through pattern;Decreased stride length     General Gait Details: minguard for balance as pt c/o dizziness throughout, but no LOB noted  Stairs            Wheelchair Mobility    Modified Rankin (Stroke Patients Only)       Balance Overall balance assessment: Needs assistance   Sitting balance-Leahy Scale:  Good       Standing balance-Leahy Scale: Fair                               Pertinent Vitals/Pain Pain Assessment: Faces Faces Pain Scale: Hurts a little bit Pain Location: abdomen Pain Descriptors / Indicators: Discomfort (just s/p paracentesis) Pain Intervention(s): Monitored during session    Home Living Family/patient expects to be discharged to:: Private residence Living Arrangements: Non-relatives/Friends Available Help at Discharge: Friend(s);Available PRN/intermittently Type of Home: House Home Access: Stairs to enter   Entrance Stairs-Number of Steps: 1 Home Layout: One level Home Equipment: None      Prior Function Level of Independence: Independent         Comments: working odd jobs     Journalist, newspaper        Extremity/Trunk Assessment   Upper Extremity Assessment Upper Extremity Assessment: Overall WFL for tasks assessed    Lower Extremity Assessment Lower Extremity Assessment: Overall WFL for tasks assessed       Communication   Communication: Prefers language other than [place];Interpreter utilized (Spanish speaking initally with in person [employer] interpreter, then switched to [employer] video with Alejandra)  Cognition Arousal/Alertness: Awake/alert Behavior During Therapy: WFL for tasks assessed/performed Overall Cognitive Status: Within Functional Limits for tasks assessed                                        General Comments General comments (skin integrity, edema, etc.): BP checked sitting 133  systolic, standing 496 with dizziness throughout    Exercises     Assessment/Plan    PT Assessment Patient needs continued PT services  PT Problem List Decreased balance;Decreased activity tolerance;Decreased safety awareness;Decreased mobility       PT Treatment Interventions Gait training;Therapeutic exercise;Balance training;Functional mobility training;Patient/family education    PT Goals (Current goals  can be found in the Care Plan section)  Acute Rehab PT Goals Patient Stated Goal: return to normal PT Goal Formulation: With patient Time For Goal Achievement: 03/10/20 Potential to Achieve Goals: Good    Frequency Min 3X/week   Barriers to discharge        Co-evaluation               AM-PAC PT "6 Clicks" Mobility  Outcome Measure Help needed turning from your back to your side while in a flat bed without using bedrails?: None Help needed moving from lying on your back to sitting on the side of a flat bed without using bedrails?: None Help needed moving to and from a bed to a chair (including a wheelchair)?: A Little Help needed standing up from a chair using your arms (e.g., wheelchair or bedside chair)?: A Little Help needed to walk in hospital room?: A Little Help needed climbing 3-5 steps with a railing? : A Little 6 Click Score: 20    End of Session   Activity Tolerance: Patient tolerated treatment well Patient left: in bed;with call bell/phone within reach   PT Visit Diagnosis: Other abnormalities of gait and mobility (R26.89)    Time: 7591-6384 PT Time Calculation (min) (ACUTE ONLY): 20 min   Charges:   PT Evaluation $PT Eval Low Complexity: 1 Low          Magda Kiel, PT Acute Rehabilitation Services Pager:[phone] [phone] 02/25/2020   Reginia Naas 02/25/2020, 5:22 PM

## 2020-02-25 NOTE — Progress Notes (Signed)
Consult received for ETOH resources. Contact information for [facility] of the [place] placed on AVS for patient to follow up with. They have Spanish Interpretors to assist.   Cedric Fishman LCSW

## 2020-02-25 NOTE — Plan of Care (Signed)

## 2020-02-25 NOTE — Progress Notes (Signed)
Subjective: [employer] interpreter used... Having persistent lower abdominal pain, maybe little better. Worsening abdominal distention.  Objective: Vital signs in last 24 hours: Temp:  [97.6 F (36.4 C)-99.2 F (37.3 C)] 97.6 F (36.4 C) (07/23 0350) Pulse Rate:  [62-83] 62 (07/23 0350) Resp:  [17-30] 20 (07/23 0350) BP: (109-143)/(56-89) 130/80 (07/23 0350) SpO2:  [87 %-100 %] 92 % (07/23 0350) Weight change:  Last BM Date: 02/25/20  PE: GEN:  Chronically ill-appearing, jaundice ABD:  Worsening abdominal distention, tenderness with voluntary guarding lower abdomen  Lab Results: CBC    Component Value Date/Time   WBC 16.0 (H) 02/23/2020 1531   RBC 4.26 02/23/2020 1531   HGB 14.2 02/23/2020 1531   HCT 39.7 02/23/2020 1531   HCT 34.7 (L) 04/09/2019 2238   PLT 249 02/23/2020 1531   MCV 93.2 02/23/2020 1531   MCH 33.3 02/23/2020 1531   MCHC 35.8 02/23/2020 1531   RDW 16.2 (H) 02/23/2020 1531   LYMPHSABS 1.7 12/24/2019 1147   MONOABS 0.8 12/24/2019 1147   EOSABS 0.1 12/24/2019 1147   BASOSABS 0.1 12/24/2019 1147   CMP     Component Value Date/Time   NA 123 (L) 02/23/2020 1531   K 3.9 02/23/2020 1531   CL 90 (L) 02/23/2020 1531   CO2 20 (L) 02/23/2020 1531   GLUCOSE 139 (H) 02/23/2020 1531   BUN 14 02/23/2020 1531   CREATININE 1.10 02/23/2020 1531   CALCIUM 8.3 (L) 02/23/2020 1531   PROT 7.3 02/23/2020 1531   ALBUMIN 2.5 (L) 02/23/2020 1531   AST 93 (H) 02/23/2020 1531   ALT 50 (H) 02/23/2020 1531   ALKPHOS 241 (H) 02/23/2020 1531   BILITOT 19.3 (HH) 02/23/2020 1531   GFRNONAA >60 02/23/2020 1531   GFRAA >60 02/23/2020 1531    Assessment:  1.  Elevated LFTs, downtrending.  Suspect multifactorial:  Cirrhosis, alcohol hepatitis, biliary stricture. 2.  Worsening abdominal distention. 3.  Intermittent fevers. 4.  Cirrhosis. 5.  Abdominal pain.  SBP versus biliary stricture versus hepatomegaly-hepatitis, versus other.  Plan:  1.  Continue antibiotics. 2.   Follow LFTs. 3.  IR evaluation possible paracentesis. 4.  Awaiting biliary stent/stricture cytology. 5.  Possible outpatient evaluation biliary stricture, pending clinical course with his cirrhosis and alcohol hepatitis. 6.  Given concern for fevers (whether from biliary stricture or SBP), patient is not a candidate for steroids for potential alcohol-hepatitis component of symptoms. 7.  [facility] will follow.  Landry Dyke 02/25/2020, 12:09 PM   Cell [phone] If no answer or after 5 PM call [phone]

## 2020-02-25 NOTE — Progress Notes (Addendum)
1 Day Post-Op    [id number] pain/jaundice  Subjective: Translator [id number] used during exam. Pt abdomen still large but less painful, he says pain is just on the right side now.  Labs are pending.  He has orders for clears, but has not received them.  He was unaware of the clear diet order.    Objective: Vital signs in last 24 hours: Temp:  [97.6 F (36.4 C)-100.6 F (38.1 C)] 97.6 F (36.4 C) (07/23 0350) Pulse Rate:  [62-91] 62 (07/23 0350) Resp:  [17-30] 20 (07/23 0350) BP: (91-143)/(55-89) 130/80 (07/23 0350) SpO2:  [87 %-100 %] 92 % (07/23 0350) Last BM Date: 02/25/20 1800 IV recorded Nothing PO recorded Urine 600 recorded Afebrile since stent replacement, VSS Labs pending   Intake/Output from previous day: 07/22 0701 - 07/23 0700 In: 1800 [I.V.:800; IV Piggyback:1000] Out: 600 [Urine:600] Intake/Output this shift: No intake/output data recorded.  General appearance: alert, cooperative, icteric and no distress Resp: clear to auscultation bilaterally GI: abdomen still large and slightly tender, port sites all look fine.  umbilical hernia,   Lab Results:  Recent Labs    02/23/20 1531  WBC 16.0*  HGB 14.2  HCT 39.7  PLT 249    BMET Recent Labs    02/23/20 1531  NA 123*  K 3.9  CL 90*  CO2 20*  GLUCOSE 139*  BUN 14  CREATININE 1.10  CALCIUM 8.3*   PT/INR Recent Labs    02/24/20 0927  LABPROT 16.1*  INR 1.3*    Recent Labs  Lab 02/23/20 1531  AST 93*  ALT 50*  ALKPHOS 241*  BILITOT 19.3*  PROT 7.3  ALBUMIN 2.5*     Lipase     Component Value Date/Time   LIPASE 118 (H) 02/23/2020 1531     Medications: . folic acid  1 mg Oral Daily  . multivitamin with minerals  1 tablet Oral Daily  . sodium chloride flush  3 mL Intravenous Q12H  . thiamine  100 mg Oral Daily   Or  . thiamine  100 mg Intravenous Daily   . cefTRIAXone (ROCEPHIN)  IV    . lactated ringers      Assessment/Plan -Hx acute cholecystitis with hydrops;  laparoscopic cholecystectomy with postoperative bile leak 11/22/2019 Dr. 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 Clarene Essex -Diagnostic laparoscopy, evacuation of bilious ascites(3L), JP drain placement 12/07/2019 Dr. Donne Hazel -Drain removal 12/23/2019 -Ongoing alcohol use  Abdominal pain, nausea, vomiting, diarrhea Partially occluded Biliary stent Jaundice, hyperbilirubinemia Increased dilatation of common hepatic duct -concerned for stent occlusion New ill-defined hypodensities inferior right hepatic lobe -possible abscesses Cirrhosis ERCP, removal of occluded stent and placement of new biliary stent, cytology pending, 02/24/20, Dr. Arta Silence POD#1  FEN:  NPO/IV fluids ID:  Zosyn 7/22;  Rocephin 7/22>> day 2 DVT: SCD's Follow up:  Dr. Henrene Dodge   Plan:  Will ask IR to look at hypodensities inferior right hepatic lobe. Follow labs.  Defer paracentesis to Medicine.  Dr. Vernard Gambles has reviewed the CT for interventional radiology.  He was not sure he would consider those hypodensity abscesses.  Additionally there to small for IR intervention.    LOS: 1 day    Vanya Carberry 02/25/2020 Please see Amion

## 2020-02-25 NOTE — Progress Notes (Signed)
PROGRESS NOTE                                                                                                                                                                                                             Patient Demographics:    Micheal Watson, is a 59 y.o. male, DOB - 1960/09/09, NFA:[id number]  Admit date - 02/23/2020   Admitting Physician Karmen Bongo, MD  Outpatient Primary MD for the patient is Patient, No Pcp Per  LOS - 1  Chief Complaint  Patient presents with   Abdominal Pain   Jaundice       Brief Narrative Micheal Watson is a 59 y.o. male with medical history significant of ETOH dependence; recent acute cholecystitis/cholangitis s/p CBD stent and JP drain placement (removed since).  He is now presenting with recurrent LLQ abdomnial pain.   Subjective:    Micheal Watson today has, No headache, No chest pain, Mild LLQ abdominal pain - No Nausea, No new weakness tingling or numbness, No Cough - SOB.     Assessment  & Plan :      1.   Sepsis following intra-abdominal surgery with current hyperbilirubinemia - in a patient with following history  -Hx acute cholecystitis with hydrops; laparoscopic cholecystectomy with postoperative bile leak 11/22/2019 Dr. 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 Clarene Essex -Diagnostic laparoscopy, evacuation of bilious ascites(3L),JP drain placement 12/07/2019 Dr. Donne Hazel -Drain removal 12/23/2019 - ERCP by Dr. Paulita Fujita on 02/24/2020 with CBD biliary stent stricture, stent removal and new stent placed.  Biopsy sent. -Ultrasound-guided paracentesis on 02/24/2021 with 2.2 L of fluid removed.   He currently has ongoing pain and sepsis, initial BC ID positive for gram-negative rods with possible E. coli, Klebsiella and Proteus growing.  This is polymicrobial, await ultrasound-guided paracentesis results.  For now continue Rocephin along with Flagyl.  GI general  surgery following.  Case discussed with GI.  Continue IV fluids and supportive care.  Monitor blood cultures and inflammatory markers closely.  Sepsis pathophysiology is resolving.   Recent Labs  Lab 02/23/20 1531 02/24/20 0407 02/24/20 0622 02/25/20 1158  WBC 16.0*  --   --  13.0*  PLT 249  --   --  226  CRP  --   --   --  18.0*  PROCALCITON  --   --   --  7.71  LATICACIDVEN  --  2.2* 1.9 3.1*    HTN - He was prescribed Amlodipine prior but has  not been taking it.  HLD - He was prescribed Lipitor prior but has not been taking it.  ETOH dependence - ongoing abuse, continue CIWA protocol and add scheduled Librium.  Counseled to quit.  H/o COVID-19 infection in April 2020  -  Resolved.      Condition - Extremely Guarded  Family Communication  :  Son Baldemar Friday [phone] on 02/25/20 2.58pm message left  Code Status :  Full  Consults  :  GI, CCS  Procedures  :    CT -  1. New small ill-defined hypodensities inferiorly in the right hepatic lobe, nonspecific though infection/small abscesses are a concern with this history. 2. Increased small to moderate volume ascites. 3. Unchanged biliary stent with increased dilatation of the common hepatic duct. 4. Aortic Atherosclerosis  ERCP - with CBD stricture at the site of her stent, stent removed and new temporary stent placed on 02/24/2020 by Dr. Paulita Fujita.  PUD Prophylaxis : PPI  Disposition Plan  :    Status is: Inpatient  Remains inpatient appropriate because:IV treatments appropriate due to intensity of illness or inability to take PO   Dispo: The patient is from: Home              Anticipated d/c is to: Home              Anticipated d/c date is: 3 days              Patient currently is not medically stable to d/c.   DVT Prophylaxis  : Heparin    Lab Results  Component Value Date   PLT 226 02/25/2020    Diet :  Diet Order            Diet NPO time specified  Diet effective now                  Inpatient  Medications Scheduled Meds:  chlordiazePOXIDE  15 mg Oral TID   folic acid  1 mg Oral Daily   lidocaine       midodrine  10 mg Oral TID WC   multivitamin with minerals  1 tablet Oral Daily   sodium chloride flush  3 mL Intravenous Q12H   thiamine  100 mg Oral Daily   Or   thiamine  100 mg Intravenous Daily   Continuous Infusions:  albumin human     cefTRIAXone (ROCEPHIN)  IV     lactated ringers 75 mL/hr at 02/25/20 0834   PRN Meds:.lidocaine, LORazepam **OR** LORazepam, [DISCONTINUED] ondansetron **OR** ondansetron (ZOFRAN) IV  Antibiotics  :   Anti-infectives (From admission, onward)   Start     Dose/Rate Route Frequency Ordered Stop   02/25/20 2000  cefTRIAXone (ROCEPHIN) 2 g in sodium chloride 0.9 % 100 mL IVPB     Discontinue     2 g 200 mL/hr over 30 Minutes Intravenous Every 24 hours 02/24/20 1847     02/24/20 1830  cefTRIAXone (ROCEPHIN) 2 g in sodium chloride 0.9 % 100 mL IVPB  Status:  Discontinued        2 g 200 mL/hr over 30 Minutes Intravenous Every 24 hours 02/24/20 1741 02/24/20 1847   02/24/20 1200  piperacillin-tazobactam (ZOSYN) IVPB 3.375 g  Status:  Discontinued        3.375 g 100 mL/hr over 30 Minutes Intravenous Every 6 hours 02/24/20 0934 02/24/20 1009   02/24/20 1200  piperacillin-tazobactam (ZOSYN) IVPB 3.375 g  Status:  Discontinued  3.375 g 12.5 mL/hr over 240 Minutes Intravenous Every 8 hours 02/24/20 1009 02/24/20 1741   02/24/20 0245  piperacillin-tazobactam (ZOSYN) IVPB 3.375 g        3.375 g 100 mL/hr over 30 Minutes Intravenous  Once 02/24/20 0237 02/24/20 0535          Objective:   Vitals:   02/25/20 0350 02/25/20 0738 02/25/20 0800 02/25/20 1200  BP: (!) 130/80 111/75 125/79 119/71  Pulse: 62 59 61 66  Resp: 20 14  18   Temp: 97.6 F (36.4 C)  97.9 F (36.6 C) 97.7 F (36.5 C)  TempSrc: Oral  Oral Oral  SpO2: 92% 91% 95% 92%  Height:        SpO2: 92 % O2 Flow Rate (L/min): 6 L/min  Wt Readings from Last 3  Encounters:  12/24/19 88.2 kg  11/28/19 96.4 kg  11/21/19 95.1 kg     Intake/Output Summary (Last 24 hours) at 02/25/2020 1441 Last data filed at 02/25/2020 1407 Gross per 24 hour  Intake 800 ml  Output 900 ml  Net -100 ml     Physical Exam  Awake Alert, No new F.N deficits,   [place].AT,PERRAL Supple Neck,No JVD, No cervical lymphadenopathy appriciated.  Symmetrical Chest wall movement, Good air movement bilaterally, CTAB RRR,No Gallops,Rubs or new Murmurs, No Parasternal Heave +ve B.Sounds, Abd is distended, +ve LLQ tenderness, No organomegaly appriciated, No rebound - guarding or rigidity. No Cyanosis, trace edema,     Data Review:    Recent Labs  Lab 02/23/20 1531 02/25/20 1158  WBC 16.0* 13.0*  HGB 14.2 13.8  HCT 39.7 38.3*  PLT 249 226  MCV 93.2 90.3  MCH 33.3 32.5  MCHC 35.8 36.0  RDW 16.2* 16.3*    Recent Labs  Lab 02/23/20 1531 02/24/20 0927 02/25/20 1158  NA 123*  --  131*  K 3.9  --  3.5  CL 90*  --  100  CO2 20*  --  19*  GLUCOSE 139*  --  247*  BUN 14  --  29*  CREATININE 1.10  --  1.16  CALCIUM 8.3*  --  7.7*  AST 93*  --  48*  ALT 50*  --  39  ALKPHOS 241*  --  177*  BILITOT 19.3*  --  9.5*  ALBUMIN 2.5*  --  1.9*  MG  --   --  2.5*  CRP  --   --  18.0*  PROCALCITON  --   --  7.71  INR  --  1.3* 1.3*  BNP  --   --  380.8*    Recent Labs  Lab 02/24/20 0604 02/25/20 1158  CRP  --  18.0*  BNP  --  380.8*  PROCALCITON  --  7.71  SARSCOV2NAA NEGATIVE  --     ------------------------------------------------------------------------------------------------------------------ No results for input(s): CHOL, HDL, LDLCALC, TRIG, CHOLHDL, LDLDIRECT in the last 72 hours.  Lab Results  Component Value Date   HGBA1C 6.0 (H) 04/10/2019   ------------------------------------------------------------------------------------------------------------------ No results for input(s): TSH, T4TOTAL, T3FREE, THYROIDAB in the last 72 hours.  Invalid  input(s): FREET3 ------------------------------------------------------------------------------------------------------------------ No results for input(s): VITAMINB12, FOLATE, FERRITIN, TIBC, IRON, RETICCTPCT in the last 72 hours.  Coagulation profile Recent Labs  Lab 02/24/20 0927 02/25/20 1158  INR 1.3* 1.3*    No results for input(s): DDIMER in the last 72 hours.  Cardiac Enzymes No results for input(s): CKMB, TROPONINI, MYOGLOBIN in the last 168 hours.  Invalid input(s): CK ------------------------------------------------------------------------------------------------------------------  Component Value Date/Time   BNP 380.8 (H) 02/25/2020 1158    Micro Results Recent Results (from the past 240 hour(s))  Blood culture (routine x 2)     Status: None (Preliminary result)   Collection Time: 02/24/20  2:47 AM   Specimen: BLOOD  Result Value Ref Range Status   Specimen Description BLOOD LEFT ANTECUBITAL  Final   Special Requests   Final    BOTTLES DRAWN AEROBIC AND ANAEROBIC Blood Culture adequate volume   Culture  Setup Time   Final    GRAM NEGATIVE RODS IN BOTH AEROBIC AND ANAEROBIC BOTTLES CRITICAL RESULT CALLED TO, READ BACK BY AND VERIFIED WITH: PHARMD MIHN P. [id number] FCP    Culture   Final    GRAM NEGATIVE RODS CULTURE REINCUBATED FOR BETTER GROWTH Performed at [facility], [place] [address] [place], [place] [address]    Report Status PENDING  Incomplete  Blood Culture ID Panel (Reflexed)     Status: Abnormal   Collection Time: 02/24/20  2:47 AM  Result Value Ref Range Status   Enterococcus species NOT DETECTED NOT DETECTED Final   Listeria monocytogenes NOT DETECTED NOT DETECTED Final   Staphylococcus species NOT DETECTED NOT DETECTED Final   Staphylococcus aureus (BCID) NOT DETECTED NOT DETECTED Final   Streptococcus species NOT DETECTED NOT DETECTED Final   Streptococcus agalactiae NOT DETECTED NOT DETECTED Final   Streptococcus pneumoniae NOT  DETECTED NOT DETECTED Final   Streptococcus pyogenes NOT DETECTED NOT DETECTED Final   Acinetobacter baumannii NOT DETECTED NOT DETECTED Final   Enterobacteriaceae species DETECTED (A) NOT DETECTED Final    Comment: CRITICAL RESULT CALLED TO, READ BACK BY AND VERIFIED WITH: PHARMD MIHN P. [phone] FCP    Enterobacter cloacae complex NOT DETECTED NOT DETECTED Final   Escherichia coli DETECTED (A) NOT DETECTED Final    Comment: CRITICAL RESULT CALLED TO, READ BACK BY AND VERIFIED WITH: PHARMD MIHN P. [id number] FCP    Klebsiella oxytoca NOT DETECTED NOT DETECTED Final   Klebsiella pneumoniae DETECTED (A) NOT DETECTED Final    Comment: CRITICAL RESULT CALLED TO, READ BACK BY AND VERIFIED WITH: PHARMD MIHN P. [id number] FCP    Proteus species DETECTED (A) NOT DETECTED Final    Comment: CRITICAL RESULT CALLED TO, READ BACK BY AND VERIFIED WITH: PHARMD MIHN P. [id number] FCP    Serratia marcescens NOT DETECTED NOT DETECTED Final   Carbapenem resistance NOT DETECTED NOT DETECTED Final   Haemophilus influenzae NOT DETECTED NOT DETECTED Final   Neisseria meningitidis NOT DETECTED NOT DETECTED Final   Pseudomonas aeruginosa NOT DETECTED NOT DETECTED Final   Candida albicans NOT DETECTED NOT DETECTED Final   Candida glabrata NOT DETECTED NOT DETECTED Final   Candida krusei NOT DETECTED NOT DETECTED Final   Candida parapsilosis NOT DETECTED NOT DETECTED Final   Candida tropicalis NOT DETECTED NOT DETECTED Final    Comment: Performed at [facility], [place]. [address] [place], [place] [address]  Blood culture (routine x 2)     Status: None (Preliminary result)   Collection Time: 02/24/20  3:56 AM   Specimen: BLOOD LEFT HAND  Result Value Ref Range Status   Specimen Description BLOOD LEFT HAND  Final   Special Requests   Final    BOTTLES DRAWN AEROBIC ONLY Blood Culture results may not be optimal due to an inadequate volume of blood received in culture bottles   Culture  Setup Time    Final  AEROBIC BOTTLE ONLY GRAM NEGATIVE RODS CRITICAL VALUE NOTED.  VALUE IS CONSISTENT WITH PREVIOUSLY REPORTED AND CALLED VALUE.    Culture   Final    GRAM NEGATIVE RODS TOO YOUNG TO READ Performed at [facility], [place] [address] [place], [place] [address]    Report Status PENDING  Incomplete  SARS Coronavirus 2 by RT PCR (hospital order, performed in [facility] hospital lab) Nasopharyngeal Nasopharyngeal Swab     Status: None   Collection Time: 02/24/20  6:04 AM   Specimen: Nasopharyngeal Swab  Result Value Ref Range Status   SARS Coronavirus 2 NEGATIVE NEGATIVE Final    Comment: (NOTE) SARS-CoV-2 target nucleic acids are NOT DETECTED.  The SARS-CoV-2 RNA is generally detectable in upper and lower respiratory specimens during the acute phase of infection. The lowest concentration of SARS-CoV-2 viral copies this assay can detect is 250 copies / mL. A negative result does not preclude SARS-CoV-2 infection and should not be used as the sole basis for treatment or other patient management decisions.  A negative result may occur with improper specimen collection / handling, submission of specimen other than nasopharyngeal swab, presence of viral mutation(s) within the areas targeted by this assay, and inadequate number of viral copies (<250 copies / mL). A negative result must be combined with clinical observations, patient history, and epidemiological information.  Fact Sheet for Patients:   StrictlyIdeas.no  Fact Sheet for Healthcare Providers: BankingDealers.co.za  This test is not yet approved or  cleared by the [place] FDA and has been authorized for detection and/or diagnosis of SARS-CoV-2 by FDA under an Emergency Use Authorization (EUA).  This EUA will remain in effect (meaning this test can be used) for the duration of the COVID-19 declaration under Section 564(b)(1) of the Act, 21 U.S.C. section  360bbb-3(b)(1), unless the authorization is terminated or revoked sooner.  Performed at [facility], [place] [address] [place], [place] [address]   C Difficile Quick Screen w PCR reflex     Status: None   Collection Time: 02/25/20  4:21 AM   Specimen: STOOL  Result Value Ref Range Status   C Diff antigen NEGATIVE NEGATIVE Final   C Diff toxin NEGATIVE NEGATIVE Final   C Diff interpretation No C. difficile detected.  Final    Comment: Performed at [facility], [place] [address], [place], [place] [address]  MRSA PCR Screening     Status: None   Collection Time: 02/25/20  4:57 AM  Result Value Ref Range Status   MRSA by PCR NEGATIVE NEGATIVE Final    Comment:        The GeneXpert MRSA Assay (FDA approved for NASAL specimens only), is one component of a comprehensive MRSA colonization surveillance program. It is not intended to diagnose MRSA infection nor to guide or monitor treatment for MRSA infections. Performed at [facility], [place] [address], [place],  [address]     Radiology Reports CT ABDOMEN PELVIS W CONTRAST  Result Date: 02/24/2020 CLINICAL DATA:  Generalized abdominal pain, swelling, and jaundice with vomiting and fever. Leukocytosis. EXAM: CT ABDOMEN AND PELVIS WITH CONTRAST TECHNIQUE: Multidetector CT imaging of the abdomen and pelvis was performed using the standard protocol following bolus administration of intravenous contrast. CONTRAST:  130mL OMNIPAQUE IOHEXOL 300 MG/ML  SOLN COMPARISON:  12/24/2019 FINDINGS: Lower chest: Punctate calcified nodules in the right middle lobe and right lower lobe. No basilar lung consolidation or pleural effusion. Hepatobiliary: There is a new  1.2 cm hypodense lesion inferiorly in the right hepatic lobe (series 3, image 63), and there are additional more subtle subcentimeter hypodensities in the inferior right hepatic lobe which also appear new (for example series 3, images 56, 58, 59, and 62). Status post  cholecystectomy. A biliary stent remains in place without significant dilatation of the common bile duct, however there is increased dilatation of the common hepatic duct about the proximal aspect of the stent measuring 2.1 cm in diameter. There is at most minimal central intrahepatic biliary dilatation. Pancreas: Unremarkable. Spleen: No focal lesion. Unchanged cleft along the posterior splenic margin. Adrenals/Urinary Tract: Unremarkable adrenal glands. No evidence of renal mass, calculi, or hydronephrosis. Unremarkable bladder. Stomach/Bowel: The stomach is unremarkable. There is no evidence of bowel obstruction. Mildly prominent gaseous distension of the appendix without evidence of appendicitis. Vascular/Lymphatic: No significant vascular findings are present. No enlarged abdominal or pelvic lymph nodes. Reproductive: Mildly enlarged prostate. Other: Increased small to moderate volume ascites. No loculated fluid collection. No pneumoperitoneum. Small umbilical hernia containing fat and fluid. Musculoskeletal: No acute osseous abnormality or suspicious osseous lesion. IMPRESSION: 1. New small ill-defined hypodensities inferiorly in the right hepatic lobe, nonspecific though infection/small abscesses are a concern with this history. 2. Increased small to moderate volume ascites. 3. Unchanged biliary stent with increased dilatation of the common hepatic duct. 4. Aortic Atherosclerosis (ICD10-I70.0). Electronically Signed   By: Logan Bores M.D.   On: 02/24/2020 05:42   DG ERCP BILIARY & PANCREATIC DUCTS  Result Date: 02/24/2020 CLINICAL DATA:  ERCP. EXAM: ERCP TECHNIQUE: Multiple spot images obtained with the fluoroscopic device and submitted for interpretation post-procedure. FLUOROSCOPY TIME:  Fluoroscopy Time:  2 minutes and 13 seconds Radiation Exposure Index (if provided by the fluoroscopic device): 66.82 mGy Number of Acquired Spot Images: 6 COMPARISON:  CT dated February 24, 2020.  11/29/2019 FINDINGS: And  insult spot images demonstrate a common bile duct stent. The stent has been removed. Injection of contrast followed by balloon sweep demonstrates a persistent narrowing of the mid and distal common bile duct. There appears to be some mild intrahepatic biliary ductal dilatation. The patient is status post prior cholecystectomy. Final images demonstrate replacement of the plastic biliary stent. IMPRESSION: Status post ERCP as detailed above. These images were submitted for radiologic interpretation only. Please see the procedural report for the amount of contrast and the fluoroscopy time utilized. Electronically Signed   By: Constance Holster M.D.   On: 02/24/2020 18:49   US Abdomen Limited RUQ  Result Date: 02/24/2020 CLINICAL DATA:  Jaundice EXAM: ULTRASOUND ABDOMEN LIMITED RIGHT UPPER QUADRANT COMPARISON:  12/24/2019 FINDINGS: Gallbladder: Surgically absent Common bile duct: Diameter: 8 mm Liver: Nodularity of the liver capsule compatible with cirrhosis. No focal parenchymal abnormality. No intrahepatic duct dilation. Portal vein is patent on color Doppler imaging with normal direction of blood flow towards the liver. Other: There is a small amount of ascites throughout the abdomen, greatest in the lower quadrants. IMPRESSION: 1. Small volume ascites. 2. Findings consistent with hepatic cirrhosis. 3. Cholecystectomy. Electronically Signed   By: Randa Ngo M.D.   On: 02/24/2020 03:23    Time Spent in minutes  30   Lala Lund M.D on 02/25/2020 at 2:41 PM  To page go to [email or web address] - password [facility]

## 2020-02-25 NOTE — Progress Notes (Signed)
   02/24/20 2000  Assess: MEWS Score  BP 109/73  Pulse Rate 67  ECG Heart Rate 64  Resp 17  Level of Consciousness Alert  SpO2 (!) 87 %  O2 Device Room Air  Patient Activity (if Appropriate) In bed  Assess: MEWS Score  MEWS Temp 0  MEWS Systolic 0  MEWS Pulse 0  MEWS RR 0  MEWS LOC 0  MEWS Score 0  MEWS Score Color Green  Assess: if the MEWS score is Yellow or Red  Were vital signs taken at a resting state? Yes  Focused Assessment No change from prior assessment  Early Detection of Sepsis Score *See Row Information* Low  MEWS guidelines implemented *See Row Information* No, other (Comment) (no acute changes)  Document  Progress note created (see row info) Yes

## 2020-02-25 NOTE — Procedures (Addendum)
PROCEDURE SUMMARY:  Spanish video interpreter used to obtain consent and perform procedure.  Successful US guided paracentesis from left lateral abdomen.  Yielded  of 2.2 L of dark yellow fluid.  No immediate complications.  Pt tolerated well.   Specimen sent for labs.  EBL < 18mL  Theresa Duty, NP 02/25/2020 2:22 PM

## 2020-02-26 LAB — CBC WITH DIFFERENTIAL/PLATELET
Abs Immature Granulocytes: 0.11 10*3/uL — ABNORMAL HIGH (ref 0.00–0.07)
Basophils Absolute: 0.1 10*3/uL (ref 0.0–0.1)
Basophils Relative: 0 %
Eosinophils Absolute: 0 10*3/uL (ref 0.0–0.5)
Eosinophils Relative: 0 %
HCT: 40.9 % (ref 39.0–52.0)
Hemoglobin: 14.4 g/dL (ref 13.0–17.0)
Immature Granulocytes: 1 %
Lymphocytes Relative: 10 %
Lymphs Abs: 1.4 10*3/uL (ref 0.7–4.0)
MCH: 32.4 pg (ref 26.0–34.0)
MCHC: 35.2 g/dL (ref 30.0–36.0)
MCV: 92.1 fL (ref 80.0–100.0)
Monocytes Absolute: 0.6 10*3/uL (ref 0.1–1.0)
Monocytes Relative: 4 %
Neutro Abs: 11.5 10*3/uL — ABNORMAL HIGH (ref 1.7–7.7)
Neutrophils Relative %: 85 %
Platelets: 159 10*3/uL (ref 150–400)
RBC: 4.44 MIL/uL (ref 4.22–5.81)
RDW: 16.8 % — ABNORMAL HIGH (ref 11.5–15.5)
WBC: 13.6 10*3/uL — ABNORMAL HIGH (ref 4.0–10.5)
nRBC: 0 % (ref 0.0–0.2)

## 2020-02-26 LAB — PROCALCITONIN: Procalcitonin: 4.72 ng/mL

## 2020-02-26 LAB — COMPREHENSIVE METABOLIC PANEL
ALT: 38 U/L (ref 0–44)
AST: 50 U/L — ABNORMAL HIGH (ref 15–41)
Albumin: 2.3 g/dL — ABNORMAL LOW (ref 3.5–5.0)
Alkaline Phosphatase: 150 U/L — ABNORMAL HIGH (ref 38–126)
Anion gap: 10 (ref 5–15)
BUN: 21 mg/dL — ABNORMAL HIGH (ref 6–20)
CO2: 19 mmol/L — ABNORMAL LOW (ref 22–32)
Calcium: 7.9 mg/dL — ABNORMAL LOW (ref 8.9–10.3)
Chloride: 104 mmol/L (ref 98–111)
Creatinine, Ser: 0.97 mg/dL (ref 0.61–1.24)
GFR calc Af Amer: >60 mL/min (ref >60)
GFR calc non Af Amer: >60 mL/min (ref >60)
Glucose, Bld: 210 mg/dL — ABNORMAL HIGH (ref 70–99)
Potassium: 4 mmol/L (ref 3.5–5.1)
Sodium: 133 mmol/L — ABNORMAL LOW (ref 135–145)
Total Bilirubin: 5.5 mg/dL — ABNORMAL HIGH (ref 0.3–1.2)
Total Protein: 6.7 g/dL (ref 6.5–8.1)

## 2020-02-26 LAB — PROTIME-INR
INR: 1.2 (ref 0.8–1.2)
Prothrombin Time: 14.8 seconds (ref 11.4–15.2)

## 2020-02-26 LAB — MAGNESIUM: Magnesium: 2.4 mg/dL (ref 1.7–2.4)

## 2020-02-26 LAB — C-REACTIVE PROTEIN: CRP: 8.6 mg/dL — ABNORMAL HIGH (ref <1.0)

## 2020-02-26 LAB — BRAIN NATRIURETIC PEPTIDE: B Natriuretic Peptide: 376.9 pg/mL — ABNORMAL HIGH (ref 0.0–100.0)

## 2020-02-26 NOTE — Progress Notes (Signed)
[facility] Progress Note  Micheal Watson 59 y.o. [birth year]  CC: Abdominal pain, bacteremia, SBP   Subjective:  interpreter software used for history taking.  Patient sitting in recliner.  Continues to have abdominal discomfort which is similar to yesterday.  Had 2 bowel movements yesterday.  Denies seeing any blood in the stool or black stool.  Denies nausea or vomiting.  ROS : Afebrile, negative for chest pain   Objective: Vital signs in last 24 hours: Vitals:   02/26/20 0427 02/26/20 0730  BP:  (!) 132/84  Pulse:  54  Resp:  20  Temp: 97.7 F (36.5 C) 97.7 F (36.5 C)  SpO2:  95%    Physical Exam:  General:  Alert, cooperative, no distress, appears stated age  Head:  Normocephalic, without obvious abnormality, atraumatic  Eyes:  , EOM's intact,   Lungs:    No visible respiratory distress noted  Heart:  Regular rate and rhythm, S1, S2 normal  Abdomen:    Abdomen is distended with generalized tenderness to palpation, bowel sounds present, generalized guarding noted but no rebound.  Extremities: Extremities normal, atraumatic, no  edema       Lab Results: Recent Labs    02/25/20 1158 02/26/20 0928  NA 131* 133*  K 3.5 4.0  CL 100 104  CO2 19* 19*  GLUCOSE 247* 210*  BUN 29* 21*  CREATININE 1.16 0.97  CALCIUM 7.7* 7.9*  MG 2.5* 2.4   Recent Labs    02/25/20 1158 02/26/20 0928  AST 48* 50*  ALT 39 38  ALKPHOS 177* 150*  BILITOT 9.5* 5.5*  PROT 6.5 6.7  ALBUMIN 1.9* 2.3*   Recent Labs    02/25/20 1158 02/26/20 0928  WBC 13.0* 13.6*  NEUTROABS  --  11.5*  HGB 13.8 14.4  HCT 38.3* 40.9  MCV 90.3 92.1  PLT 226 159   Recent Labs    02/25/20 1158 02/26/20 0928  LABPROT 15.6* 14.8  INR 1.3* 1.2      Assessment/Plan: -Elevated LFTs.  Improving.  Multifactorial from cirrhosis along with alcohol use and history of biliary stricture. -Polymicrobial bacteremia along with SBP. -History of laparoscopic cholecystectomy with  postoperative bile leak in April 2021.  Follow-up ERCP with stent placement on November 29, 2019.  Had diagnostic laparoscopy in May 2021.  Another ERCP on February 24, 2020-previously placed stent was partially occluded which was removed.   Another 10 French 7 cm plastic stent was placed.  Cytology showed atypical cells.  Recommendations ----------------------- -Had paracentesis yesterday with removal of 2.2 L of fluid.  Fluid analysis positive for SBP.  Patient already on antibiotics for polymicrobial bacteremia. -Continue current management -May need outpatient EUS along with ERCP / cholangioscopy for evaluation of biliary stricture depending on overall clinical condition -GI will follow  Otis Brace MD, FACP 02/26/2020, 11:19 AM  Contact #  [phone]

## 2020-02-26 NOTE — Progress Notes (Signed)
Informed by CCMD of pt HR in 29s. Previously SB in 60s. Pt has  no c/o anything. Other VSS. Dr. Candiss Norse made aware. Midodrine TID discontinued.

## 2020-02-26 NOTE — Progress Notes (Signed)
PROGRESS NOTE                                                                                                                                                                                                             Patient Demographics:    Micheal Watson, is a 59 y.o. male, DOB - 07/26/1961, ZDG:[id number]  Admit date - 02/23/2020   Admitting Physician Karmen Bongo, MD  Outpatient Primary MD for the patient is Patient, No Pcp Per  LOS - 2  Chief Complaint  Patient presents with  . Abdominal Pain  . Jaundice       Brief Narrative Micheal Watson is a 59 y.o. male with medical history significant of ETOH dependence; recent acute cholecystitis/cholangitis s/p CBD stent and JP drain placement (removed since).  He is now presenting with recurrent LLQ abdomnial pain.   Subjective:   Patient in bed, appears comfortable, denies any headache, no fever, no chest pain or pressure, no shortness of breath , improved abdominal pain. No focal weakness.    Assessment  & Plan :      1.   Sepsis following intra-abdominal surgery with current hyperbilirubinemia - in a patient with following history  -Hx acute cholecystitis with hydrops; laparoscopic cholecystectomy with postoperative bile leak 11/22/2019 Dr. 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 Clarene Essex -Diagnostic laparoscopy, evacuation of bilious ascites(3L),JP drain placement 12/07/2019 Dr. Donne Hazel -Drain removal 12/23/2019 - ERCP by Dr. Paulita Fujita on 02/24/2020 with CBD biliary stent stricture, stent removal and new stent placed.  Biopsy sent. -Ultrasound-guided paracentesis on 02/24/2021 with 2.2 L of fluid removed.  He currently has ongoing pain and sepsis, initial BC ID positive for gram-negative rods with possible E. coli, Klebsiella and Proteus growing.  This is polymicrobial, await ultrasound-guided paracentesis results.  For now continue Rocephin along with Flagyl.  GI  general surgery following.  Case discussed with GI.  Continue IV fluids and supportive care.  Monitor blood cultures and inflammatory markers closely.  Sepsis pathophysiology has resolved.  Recent Labs  Lab 02/23/20 1531 02/24/20 0407 02/24/20 0622 02/25/20 1158 02/26/20 0928  WBC 16.0*  --   --  13.0* 13.6*  PLT 249  --   --  226 159  CRP  --   --   --  18.0* 8.6*  PROCALCITON  --   --   --  7.71 4.72  LATICACIDVEN  --  2.2* 1.9 3.1*  --     HTN - He was  prescribed Amlodipine prior but has not been taking it.  HLD - He was prescribed Lipitor prior but has not been taking it.  ETOH dependence - ongoing abuse, continue CIWA protocol and add scheduled Librium.  Counseled to quit.  H/o COVID-19 infection in April 2020  -  Resolved.      Condition - Extremely Guarded  Family Communication  :  Son Baldemar Friday [phone] on 02/25/20 2.58pm message left  Code Status :  Full  Consults  :  GI, CCS  Procedures  :    CT -  1. New small ill-defined hypodensities inferiorly in the right hepatic lobe, nonspecific though infection/small abscesses are a concern with this history. 2. Increased small to moderate volume ascites. 3. Unchanged biliary stent with increased dilatation of the common hepatic duct. 4. Aortic Atherosclerosis  ERCP - with CBD stricture at the site of her stent, stent removed and new temporary stent placed on 02/24/2020 by Dr. Paulita Fujita.  PUD Prophylaxis : PPI  Disposition Plan  :    Status is: Inpatient  Remains inpatient appropriate because:IV treatments appropriate due to intensity of illness or inability to take PO   Dispo: The patient is from: Home              Anticipated d/c is to: Home              Anticipated d/c date is: 3 days              Patient currently is not medically stable to d/c.   DVT Prophylaxis  : Heparin    Lab Results  Component Value Date   PLT 159 02/26/2020    Diet :  Diet Order            Diet clear liquid Room service  appropriate? No; Fluid consistency: Thin  Diet effective now                  Inpatient Medications Scheduled Meds: . chlordiazePOXIDE  15 mg Oral TID  . folic acid  1 mg Oral Daily  . heparin injection (subcutaneous)  5,000 Units Subcutaneous Q8H  . midodrine  10 mg Oral TID WC  . multivitamin with minerals  1 tablet Oral Daily  . pantoprazole  40 mg Oral Daily  . sodium chloride flush  3 mL Intravenous Q12H  . thiamine  100 mg Oral Daily   Or  . thiamine  100 mg Intravenous Daily   Continuous Infusions: . cefTRIAXone (ROCEPHIN)  IV 2 g (02/25/20 2308)  . lactated ringers 75 mL/hr at 02/26/20 0617  . metronidazole 500 mg (02/26/20 0624)   PRN Meds:.lidocaine, LORazepam **OR** LORazepam, [DISCONTINUED] ondansetron **OR** ondansetron (ZOFRAN) IV  Antibiotics  :   Anti-infectives (From admission, onward)   Start     Dose/Rate Route Frequency Ordered Stop   02/25/20 2000  cefTRIAXone (ROCEPHIN) 2 g in sodium chloride 0.9 % 100 mL IVPB     Discontinue     2 g 200 mL/hr over 30 Minutes Intravenous Every 24 hours 02/24/20 1847     02/25/20 1515  metroNIDAZOLE (FLAGYL) IVPB 500 mg     Discontinue     500 mg 100 mL/hr over 60 Minutes Intravenous Every 8 hours 02/25/20 1455     02/24/20 1830  cefTRIAXone (ROCEPHIN) 2 g in sodium chloride 0.9 % 100 mL IVPB  Status:  Discontinued        2 g 200 mL/hr over 30 Minutes Intravenous Every 24 hours 02/24/20  1741 02/24/20 1847   02/24/20 1200  piperacillin-tazobactam (ZOSYN) IVPB 3.375 g  Status:  Discontinued        3.375 g 100 mL/hr over 30 Minutes Intravenous Every 6 hours 02/24/20 0934 02/24/20 1009   02/24/20 1200  piperacillin-tazobactam (ZOSYN) IVPB 3.375 g  Status:  Discontinued        3.375 g 12.5 mL/hr over 240 Minutes Intravenous Every 8 hours 02/24/20 1009 02/24/20 1741   02/24/20 0245  piperacillin-tazobactam (ZOSYN) IVPB 3.375 g        3.375 g 100 mL/hr over 30 Minutes Intravenous  Once 02/24/20 0237 02/24/20 0535           Objective:   Vitals:   02/26/20 0031 02/26/20 0426 02/26/20 0427 02/26/20 0730  BP:  (!) 140/83  (!) 132/84  Pulse:  51  54  Resp: 19 16  20   Temp: 97.7 F (36.5 C) (!) 97.4 F (36.3 C) 97.7 F (36.5 C) 97.7 F (36.5 C)  TempSrc: Oral Axillary Oral Oral  SpO2:  94%  95%  Height:        SpO2: 95 % O2 Flow Rate (L/min): 6 L/min  Wt Readings from Last 3 Encounters:  12/24/19 88.2 kg  11/28/19 96.4 kg  11/21/19 95.1 kg     Intake/Output Summary (Last 24 hours) at 02/26/2020 1121 Last data filed at 02/26/2020 0644 Gross per 24 hour  Intake 2403.97 ml  Output 1650 ml  Net 753.97 ml     Physical Exam  Awake Alert, No new F.N deficits, Normal affect [place].AT,PERRAL Supple Neck,No JVD, No cervical lymphadenopathy appriciated.  Symmetrical Chest wall movement, Good air movement bilaterally, CTAB RRR,No Gallops, Rubs or new Murmurs, No Parasternal Heave +ve B.Sounds, Abd Soft mild ascites and left lower quadrant minimal tenderness,   No Cyanosis, Clubbing or edema, No new Rash or bruise    Data Review:    Recent Labs  Lab 02/23/20 1531 02/25/20 1158 02/26/20 0928  WBC 16.0* 13.0* 13.6*  HGB 14.2 13.8 14.4  HCT 39.7 38.3* 40.9  PLT 249 226 159  MCV 93.2 90.3 92.1  MCH 33.3 32.5 32.4  MCHC 35.8 36.0 35.2  RDW 16.2* 16.3* 16.8*  LYMPHSABS  --   --  1.4  MONOABS  --   --  0.6  EOSABS  --   --  0.0  BASOSABS  --   --  0.1    Recent Labs  Lab 02/23/20 1531 02/24/20 0927 02/25/20 1158 02/26/20 0928  NA 123*  --  131* 133*  K 3.9  --  3.5 4.0  CL 90*  --  100 104  CO2 20*  --  19* 19*  GLUCOSE 139*  --  247* 210*  BUN 14  --  29* 21*  CREATININE 1.10  --  1.16 0.97  CALCIUM 8.3*  --  7.7* 7.9*  AST 93*  --  48* 50*  ALT 50*  --  39 38  ALKPHOS 241*  --  177* 150*  BILITOT 19.3*  --  9.5* 5.5*  ALBUMIN 2.5*  --  1.9* 2.3*  MG  --   --  2.5* 2.4  CRP  --   --  18.0* 8.6*  PROCALCITON  --   --  7.71 4.72  INR  --  1.3* 1.3* 1.2  BNP  --    --  380.8*  --     Recent Labs  Lab 02/24/20 0604 02/25/20 1158 02/26/20 0928  CRP  --  18.0* 8.6*  BNP  --  380.8*  --   PROCALCITON  --  7.71 4.72  SARSCOV2NAA NEGATIVE  --   --     ------------------------------------------------------------------------------------------------------------------ No results for input(s): CHOL, HDL, LDLCALC, TRIG, CHOLHDL, LDLDIRECT in the last 72 hours.  Lab Results  Component Value Date   HGBA1C 6.0 (H) 04/10/2019   ------------------------------------------------------------------------------------------------------------------ No results for input(s): TSH, T4TOTAL, T3FREE, THYROIDAB in the last 72 hours.  Invalid input(s): FREET3 ------------------------------------------------------------------------------------------------------------------ No results for input(s): VITAMINB12, FOLATE, FERRITIN, TIBC, IRON, RETICCTPCT in the last 72 hours.  Coagulation profile Recent Labs  Lab 02/24/20 0927 02/25/20 1158 02/26/20 0928  INR 1.3* 1.3* 1.2    No results for input(s): DDIMER in the last 72 hours.  Cardiac Enzymes No results for input(s): CKMB, TROPONINI, MYOGLOBIN in the last 168 hours.  Invalid input(s): CK ------------------------------------------------------------------------------------------------------------------    Component Value Date/Time   BNP 380.8 (H) 02/25/2020 1158    Micro Results Recent Results (from the past 240 hour(s))  Blood culture (routine x 2)     Status: Abnormal (Preliminary result)   Collection Time: 02/24/20  2:47 AM   Specimen: BLOOD  Result Value Ref Range Status   Specimen Description BLOOD LEFT ANTECUBITAL  Final   Special Requests   Final    BOTTLES DRAWN AEROBIC AND ANAEROBIC Blood Culture adequate volume   Culture  Setup Time   Final    GRAM NEGATIVE RODS IN BOTH AEROBIC AND ANAEROBIC BOTTLES CRITICAL RESULT CALLED TO, READ BACK BY AND VERIFIED WITH: PHARMD MIHN P. [phone] FCP     Culture (A)  Final    ESCHERICHIA COLI KLEBSIELLA PNEUMONIAE PROTEUS PENNERI SUSCEPTIBILITIES TO FOLLOW Performed at [facility], [place] [address], [place], [place] [address]    Report Status PENDING  Incomplete  Blood Culture ID Panel (Reflexed)     Status: Abnormal   Collection Time: 02/24/20  2:47 AM  Result Value Ref Range Status   Enterococcus species NOT DETECTED NOT DETECTED Final   Listeria monocytogenes NOT DETECTED NOT DETECTED Final   Staphylococcus species NOT DETECTED NOT DETECTED Final   Staphylococcus aureus (BCID) NOT DETECTED NOT DETECTED Final   Streptococcus species NOT DETECTED NOT DETECTED Final   Streptococcus agalactiae NOT DETECTED NOT DETECTED Final   Streptococcus pneumoniae NOT DETECTED NOT DETECTED Final   Streptococcus pyogenes NOT DETECTED NOT DETECTED Final   Acinetobacter baumannii NOT DETECTED NOT DETECTED Final   Enterobacteriaceae species DETECTED (A) NOT DETECTED Final    Comment: CRITICAL RESULT CALLED TO, READ BACK BY AND VERIFIED WITH: PHARMD MIHN P. [phone] FCP    Enterobacter cloacae complex NOT DETECTED NOT DETECTED Final   Escherichia coli DETECTED (A) NOT DETECTED Final    Comment: CRITICAL RESULT CALLED TO, READ BACK BY AND VERIFIED WITH: PHARMD MIHN P. [phone] FCP    Klebsiella oxytoca NOT DETECTED NOT DETECTED Final   Klebsiella pneumoniae DETECTED (A) NOT DETECTED Final    Comment: CRITICAL RESULT CALLED TO, READ BACK BY AND VERIFIED WITH: PHARMD MIHN P. [phone] FCP    Proteus species DETECTED (A) NOT DETECTED Final    Comment: CRITICAL RESULT CALLED TO, READ BACK BY AND VERIFIED WITH: PHARMD MIHN P. [phone] FCP    Serratia marcescens NOT DETECTED NOT DETECTED Final   Carbapenem resistance NOT DETECTED NOT DETECTED Final   Haemophilus influenzae NOT DETECTED NOT DETECTED Final   Neisseria meningitidis NOT DETECTED NOT DETECTED Final   Pseudomonas aeruginosa NOT DETECTED NOT DETECTED  Final   Candida  albicans NOT DETECTED NOT DETECTED Final   Candida glabrata NOT DETECTED NOT DETECTED Final   Candida krusei NOT DETECTED NOT DETECTED Final   Candida parapsilosis NOT DETECTED NOT DETECTED Final   Candida tropicalis NOT DETECTED NOT DETECTED Final    Comment: Performed at [facility], [place] [address], [place], [place] [address]  Blood culture (routine x 2)     Status: None (Preliminary result)   Collection Time: 02/24/20  3:56 AM   Specimen: BLOOD LEFT HAND  Result Value Ref Range Status   Specimen Description BLOOD LEFT HAND  Final   Special Requests   Final    BOTTLES DRAWN AEROBIC ONLY Blood Culture results may not be optimal due to an inadequate volume of blood received in culture bottles   Culture  Setup Time   Final    AEROBIC BOTTLE ONLY GRAM NEGATIVE RODS CRITICAL VALUE NOTED.  VALUE IS CONSISTENT WITH PREVIOUSLY REPORTED AND CALLED VALUE.    Culture   Final    GRAM NEGATIVE RODS IDENTIFICATION TO FOLLOW Performed at [facility], [place] [address], [place], [place] [address]    Report Status PENDING  Incomplete  SARS Coronavirus 2 by RT PCR (hospital order, performed in [facility] hospital lab) Nasopharyngeal Nasopharyngeal Swab     Status: None   Collection Time: 02/24/20  6:04 AM   Specimen: Nasopharyngeal Swab  Result Value Ref Range Status   SARS Coronavirus 2 NEGATIVE NEGATIVE Final    Comment: (NOTE) SARS-CoV-2 target nucleic acids are NOT DETECTED.  The SARS-CoV-2 RNA is generally detectable in upper and lower respiratory specimens during the acute phase of infection. The lowest concentration of SARS-CoV-2 viral copies this assay can detect is 250 copies / mL. A negative result does not preclude SARS-CoV-2 infection and should not be used as the sole basis for treatment or other patient management decisions.  A negative result may occur with improper specimen collection / handling, submission of specimen other than nasopharyngeal swab, presence of  viral mutation(s) within the areas targeted by this assay, and inadequate number of viral copies (<250 copies / mL). A negative result must be combined with clinical observations, patient history, and epidemiological information.  Fact Sheet for Patients:   StrictlyIdeas.no  Fact Sheet for Healthcare Providers: BankingDealers.co.za  This test is not yet approved or  cleared by the [place] FDA and has been authorized for detection and/or diagnosis of SARS-CoV-2 by FDA under an Emergency Use Authorization (EUA).  This EUA will remain in effect (meaning this test can be used) for the duration of the COVID-19 declaration under Section 564(b)(1) of the Act, 21 U.S.C. section 360bbb-3(b)(1), unless the authorization is terminated or revoked sooner.  Performed at [facility], [place] [address] [place], [place] [address]   C Difficile Quick Screen w PCR reflex     Status: None   Collection Time: 02/25/20  4:21 AM   Specimen: STOOL  Result Value Ref Range Status   C Diff antigen NEGATIVE NEGATIVE Final   C Diff toxin NEGATIVE NEGATIVE Final   C Diff interpretation No C. difficile detected.  Final    Comment: Performed at [facility], [place] [address], [place], [place] [address]  Gastrointestinal Panel by PCR , Stool     Status: None   Collection Time: 02/25/20  4:21 AM   Specimen: STOOL  Result Value Ref Range Status   Campylobacter species NOT DETECTED NOT DETECTED  Final   Plesimonas shigelloides NOT DETECTED NOT DETECTED Final   Salmonella species NOT DETECTED NOT DETECTED Final   Yersinia enterocolitica NOT DETECTED NOT DETECTED Final   Vibrio species NOT DETECTED NOT DETECTED Final   Vibrio cholerae NOT DETECTED NOT DETECTED Final   Enteroaggregative E coli (EAEC) NOT DETECTED NOT DETECTED Final   Enteropathogenic E coli (EPEC) NOT DETECTED NOT DETECTED Final   Enterotoxigenic E coli (ETEC) NOT DETECTED NOT DETECTED  Final   Shiga like toxin producing E coli (STEC) NOT DETECTED NOT DETECTED Final   Shigella/Enteroinvasive E coli (EIEC) NOT DETECTED NOT DETECTED Final   Cryptosporidium NOT DETECTED NOT DETECTED Final   Cyclospora cayetanensis NOT DETECTED NOT DETECTED Final   Entamoeba histolytica NOT DETECTED NOT DETECTED Final   Giardia lamblia NOT DETECTED NOT DETECTED Final   Adenovirus F40/41 NOT DETECTED NOT DETECTED Final   Astrovirus NOT DETECTED NOT DETECTED Final   Norovirus GI/GII NOT DETECTED NOT DETECTED Final   Rotavirus A NOT DETECTED NOT DETECTED Final   Sapovirus (I, II, IV, and V) NOT DETECTED NOT DETECTED Final    Comment: Performed at [facility], [place] [place], [place] [address]  MRSA PCR Screening     Status: None   Collection Time: 02/25/20  4:57 AM  Result Value Ref Range Status   MRSA by PCR NEGATIVE NEGATIVE Final    Comment:        The GeneXpert MRSA Assay (FDA approved for NASAL specimens only), is one component of a comprehensive MRSA colonization surveillance program. It is not intended to diagnose MRSA infection nor to guide or monitor treatment for MRSA infections. Performed at [facility], [place] [address] [place], [place] [address]   Gram stain     Status: None   Collection Time: 02/25/20  3:21 PM   Specimen: Abdomen  Result Value Ref Range Status   Specimen Description ABDOMEN  Final   Special Requests PERITONEAL FLUID  Final   Gram Stain   Final    ABUNDANT WBC PRESENT, PREDOMINANTLY PMN NO ORGANISMS SEEN Performed at [facility], [place] [address], [place], [place] [address]    Report Status 02/25/2020 FINAL  Final  Culture, body fluid-bottle     Status: None (Preliminary result)   Collection Time: 02/25/20  3:21 PM   Specimen: Abdomen  Result Value Ref Range Status   Specimen Description ABDOMEN  Final   Special Requests PERITONEAL FLUID  Final   Culture   Final    NO GROWTH < 24 HOURS Performed at [facility], [place] [address] [place], [place] [address]    Report Status PENDING  Incomplete    Radiology Reports CT ABDOMEN PELVIS W CONTRAST  Result Date: 02/24/2020 CLINICAL DATA:  Generalized abdominal pain, swelling, and jaundice with vomiting and fever. Leukocytosis. EXAM: CT ABDOMEN AND PELVIS WITH CONTRAST TECHNIQUE: Multidetector CT imaging of the abdomen and pelvis was performed using the standard protocol following bolus administration of intravenous contrast. CONTRAST:  176mL OMNIPAQUE IOHEXOL 300 MG/ML  SOLN COMPARISON:  12/24/2019 FINDINGS: Lower chest: Punctate calcified nodules in the right middle lobe and right lower lobe. No basilar lung consolidation or pleural effusion. Hepatobiliary: There is a new 1.2 cm hypodense lesion inferiorly in the right hepatic lobe (series 3, image 63), and there are additional more subtle subcentimeter hypodensities in the inferior right hepatic lobe which also appear new (for example series 3, images 56, 58, 59, and 62). Status post  cholecystectomy. A biliary stent remains in place without significant dilatation of the common bile duct, however there is increased dilatation of the common hepatic duct about the proximal aspect of the stent measuring 2.1 cm in diameter. There is at most minimal central intrahepatic biliary dilatation. Pancreas: Unremarkable. Spleen: No focal lesion. Unchanged cleft along the posterior splenic margin. Adrenals/Urinary Tract: Unremarkable adrenal glands. No evidence of renal mass, calculi, or hydronephrosis. Unremarkable bladder. Stomach/Bowel: The stomach is unremarkable. There is no evidence of bowel obstruction. Mildly prominent gaseous distension of the appendix without evidence of appendicitis. Vascular/Lymphatic: No significant vascular findings are present. No enlarged abdominal or pelvic lymph nodes. Reproductive: Mildly enlarged prostate. Other: Increased small to moderate volume ascites. No loculated fluid collection. No  pneumoperitoneum. Small umbilical hernia containing fat and fluid. Musculoskeletal: No acute osseous abnormality or suspicious osseous lesion. IMPRESSION: 1. New small ill-defined hypodensities inferiorly in the right hepatic lobe, nonspecific though infection/small abscesses are a concern with this history. 2. Increased small to moderate volume ascites. 3. Unchanged biliary stent with increased dilatation of the common hepatic duct. 4. Aortic Atherosclerosis (ICD10-I70.0). Electronically Signed   By: Logan Bores M.D.   On: 02/24/2020 05:42   DG ERCP BILIARY & PANCREATIC DUCTS  Result Date: 02/24/2020 CLINICAL DATA:  ERCP. EXAM: ERCP TECHNIQUE: Multiple spot images obtained with the fluoroscopic device and submitted for interpretation post-procedure. FLUOROSCOPY TIME:  Fluoroscopy Time:  2 minutes and 13 seconds Radiation Exposure Index (if provided by the fluoroscopic device): 66.82 mGy Number of Acquired Spot Images: 6 COMPARISON:  CT dated February 24, 2020.  11/29/2019 FINDINGS: And insult spot images demonstrate a common bile duct stent. The stent has been removed. Injection of contrast followed by balloon sweep demonstrates a persistent narrowing of the mid and distal common bile duct. There appears to be some mild intrahepatic biliary ductal dilatation. The patient is status post prior cholecystectomy. Final images demonstrate replacement of the plastic biliary stent. IMPRESSION: Status post ERCP as detailed above. These images were submitted for radiologic interpretation only. Please see the procedural report for the amount of contrast and the fluoroscopy time utilized. Electronically Signed   By: Constance Holster M.D.   On: 02/24/2020 18:49   US Abdomen Limited RUQ  Result Date: 02/24/2020 CLINICAL DATA:  Jaundice EXAM: ULTRASOUND ABDOMEN LIMITED RIGHT UPPER QUADRANT COMPARISON:  12/24/2019 FINDINGS: Gallbladder: Surgically absent Common bile duct: Diameter: 8 mm Liver: Nodularity of the liver  capsule compatible with cirrhosis. No focal parenchymal abnormality. No intrahepatic duct dilation. Portal vein is patent on color Doppler imaging with normal direction of blood flow towards the liver. Other: There is a small amount of ascites throughout the abdomen, greatest in the lower quadrants. IMPRESSION: 1. Small volume ascites. 2. Findings consistent with hepatic cirrhosis. 3. Cholecystectomy. Electronically Signed   By: Randa Ngo M.D.   On: 02/24/2020 03:23   IR Paracentesis  Result Date: 02/25/2020 INDICATION: Patient with a history of alcoholic hepatitis and ascites presents today for a therapeutic and diagnostic paracentesis. EXAM: ULTRASOUND GUIDED PARACENTESIS MEDICATIONS: 1% lidocaine 10 mL COMPLICATIONS: None immediate PROCEDURE: Informed written consent was obtained from the patient after a discussion of the risks, benefits and alternatives to treatment. A timeout was performed prior to the initiation of the procedure. Initial ultrasound scanning demonstrates a large amount of ascites within the left lower abdominal quadrant. The left lower abdomen was prepped and draped in the usual sterile fashion. 1% lidocaine was used for local anesthesia. Following this, a 19 gauge, 7-cm,  Yueh catheter was introduced. An ultrasound image was saved for documentation purposes. The paracentesis was performed. The catheter was removed and a dressing was applied. The patient tolerated the procedure well without immediate post procedural complication. FINDINGS: A total of approximately 2.2 L of dark yellow fluid was removed. Samples were sent to the laboratory as requested by the clinical team. IMPRESSION: Successful ultrasound-guided paracentesis yielding 2.2 liters of peritoneal fluid. Read by: Soyla Dryer, NP Electronically Signed   By: Lucrezia Europe M.D.   On: 02/25/2020 14:28    Time Spent in minutes  30   Lala Lund M.D on 02/26/2020 at 11:21 AM  To page go to [email or web address] - password  [facility]

## 2020-02-26 NOTE — Plan of Care (Signed)

## 2020-02-26 NOTE — Progress Notes (Signed)
2 Days Post-Op   Subjective/Chief Complaint: Reports abdominal pain less Tolerating po   Objective: Vital signs in last 24 hours: Temp:  [97.4 F (36.3 C)-98 F (36.7 C)] 97.7 F (36.5 C) (07/24 0730) Pulse Rate:  [51-66] 54 (07/24 0730) Resp:  [16-21] 20 (07/24 0730) BP: (119-140)/(70-84) 132/84 (07/24 0730) SpO2:  [92 %-95 %] 95 % (07/24 0730) Last BM Date: 02/25/20  Intake/Output from previous day: 07/23 0701 - 07/24 0700 In: 2404 [P.O.:666; I.V.:899; IV Piggyback:839] Out: 9233 [Urine:1650] Intake/Output this shift: No intake/output data recorded.  Exam: Up in a chair, appears comfortable Awake and alert Abdomen obese, mildly tender, no peritonitis  Lab Results:  Recent Labs    02/23/20 1531 02/25/20 1158  WBC 16.0* 13.0*  HGB 14.2 13.8  HCT 39.7 38.3*  PLT 249 226   BMET Recent Labs    02/23/20 1531 02/25/20 1158  NA 123* 131*  K 3.9 3.5  CL 90* 100  CO2 20* 19*  GLUCOSE 139* 247*  BUN 14 29*  CREATININE 1.10 1.16  CALCIUM 8.3* 7.7*   PT/INR Recent Labs    02/24/20 0927 02/25/20 1158  LABPROT 16.1* 15.6*  INR 1.3* 1.3*   ABG No results for input(s): PHART, HCO3 in the last 72 hours.  Invalid input(s): PCO2, PO2  Studies/Results: DG ERCP BILIARY & PANCREATIC DUCTS  Result Date: 02/24/2020 CLINICAL DATA:  ERCP. EXAM: ERCP TECHNIQUE: Multiple spot images obtained with the fluoroscopic device and submitted for interpretation post-procedure. FLUOROSCOPY TIME:  Fluoroscopy Time:  2 minutes and 13 seconds Radiation Exposure Index (if provided by the fluoroscopic device): 66.82 mGy Number of Acquired Spot Images: 6 COMPARISON:  CT dated February 24, 2020.  11/29/2019 FINDINGS: And insult spot images demonstrate a common bile duct stent. The stent has been removed. Injection of contrast followed by balloon sweep demonstrates a persistent narrowing of the mid and distal common bile duct. There appears to be some mild intrahepatic biliary ductal  dilatation. The patient is status post prior cholecystectomy. Final images demonstrate replacement of the plastic biliary stent. IMPRESSION: Status post ERCP as detailed above. These images were submitted for radiologic interpretation only. Please see the procedural report for the amount of contrast and the fluoroscopy time utilized. Electronically Signed   By: Constance Holster M.D.   On: 02/24/2020 18:49   IR Paracentesis  Result Date: 02/25/2020 INDICATION: Patient with a history of alcoholic hepatitis and ascites presents today for a therapeutic and diagnostic paracentesis. EXAM: ULTRASOUND GUIDED PARACENTESIS MEDICATIONS: 1% lidocaine 10 mL COMPLICATIONS: None immediate PROCEDURE: Informed written consent was obtained from the patient after a discussion of the risks, benefits and alternatives to treatment. A timeout was performed prior to the initiation of the procedure. Initial ultrasound scanning demonstrates a large amount of ascites within the left lower abdominal quadrant. The left lower abdomen was prepped and draped in the usual sterile fashion. 1% lidocaine was used for local anesthesia. Following this, a 19 gauge, 7-cm, Yueh catheter was introduced. An ultrasound image was saved for documentation purposes. The paracentesis was performed. The catheter was removed and a dressing was applied. The patient tolerated the procedure well without immediate post procedural complication. FINDINGS: A total of approximately 2.2 L of dark yellow fluid was removed. Samples were sent to the laboratory as requested by the clinical team. IMPRESSION: Successful ultrasound-guided paracentesis yielding 2.2 liters of peritoneal fluid. Read by: Soyla Dryer, NP Electronically Signed   By: Lucrezia Europe M.D.   On: 02/25/2020 14:28  Anti-infectives: Anti-infectives (From admission, onward)   Start     Dose/Rate Route Frequency Ordered Stop   02/25/20 2000  cefTRIAXone (ROCEPHIN) 2 g in sodium chloride 0.9 % 100 mL  IVPB     Discontinue     2 g 200 mL/hr over 30 Minutes Intravenous Every 24 hours 02/24/20 1847     02/25/20 1515  metroNIDAZOLE (FLAGYL) IVPB 500 mg     Discontinue     500 mg 100 mL/hr over 60 Minutes Intravenous Every 8 hours 02/25/20 1455     02/24/20 1830  cefTRIAXone (ROCEPHIN) 2 g in sodium chloride 0.9 % 100 mL IVPB  Status:  Discontinued        2 g 200 mL/hr over 30 Minutes Intravenous Every 24 hours 02/24/20 1741 02/24/20 1847   02/24/20 1200  piperacillin-tazobactam (ZOSYN) IVPB 3.375 g  Status:  Discontinued        3.375 g 100 mL/hr over 30 Minutes Intravenous Every 6 hours 02/24/20 0934 02/24/20 1009   02/24/20 1200  piperacillin-tazobactam (ZOSYN) IVPB 3.375 g  Status:  Discontinued        3.375 g 12.5 mL/hr over 240 Minutes Intravenous Every 8 hours 02/24/20 1009 02/24/20 1741   02/24/20 0245  piperacillin-tazobactam (ZOSYN) IVPB 3.375 g        3.375 g 100 mL/hr over 30 Minutes Intravenous  Once 02/24/20 0237 02/24/20 0535      Assessment/Plan: s/p Procedure(s): ENDOSCOPIC RETROGRADE CHOLANGIOPANCREATOGRAPHY (ERCP) (N/A) STENT REMOVAL BILIARY STENT PLACEMENT BILIARY BRUSHING  2.2 liters removed with paracentesis yesterday Labs pending S/p replacing CBD stent  From a general surgery standpoint, there is currently little for [place] to offer.  Labs are improving. No surgical intervention planned. Will see again as outpt unless needed while still here   LOS: 2 days    Coralie Keens 02/26/2020

## 2020-02-27 ENCOUNTER — Inpatient Hospital Stay (HOSPITAL_COMMUNITY): Payer: Self-pay

## 2020-02-27 LAB — CULTURE, BLOOD (ROUTINE X 2): Special Requests: ADEQUATE

## 2020-02-27 LAB — CBC WITH DIFFERENTIAL/PLATELET
Abs Immature Granulocytes: 0.09 10*3/uL — ABNORMAL HIGH (ref 0.00–0.07)
Basophils Absolute: 0 10*3/uL (ref 0.0–0.1)
Basophils Relative: 0 %
Eosinophils Absolute: 0 10*3/uL (ref 0.0–0.5)
Eosinophils Relative: 0 %
HCT: 42.1 % (ref 39.0–52.0)
Hemoglobin: 14.6 g/dL (ref 13.0–17.0)
Immature Granulocytes: 1 %
Lymphocytes Relative: 21 %
Lymphs Abs: 2.3 10*3/uL (ref 0.7–4.0)
MCH: 32.5 pg (ref 26.0–34.0)
MCHC: 34.7 g/dL (ref 30.0–36.0)
MCV: 93.8 fL (ref 80.0–100.0)
Monocytes Absolute: 0.9 10*3/uL (ref 0.1–1.0)
Monocytes Relative: 9 %
Neutro Abs: 7.4 10*3/uL (ref 1.7–7.7)
Neutrophils Relative %: 69 %
Platelets: 299 10*3/uL (ref 150–400)
RBC: 4.49 MIL/uL (ref 4.22–5.81)
RDW: 17.2 % — ABNORMAL HIGH (ref 11.5–15.5)
WBC: 10.7 10*3/uL — ABNORMAL HIGH (ref 4.0–10.5)
nRBC: 0 % (ref 0.0–0.2)

## 2020-02-27 LAB — COMPREHENSIVE METABOLIC PANEL
ALT: 47 U/L — ABNORMAL HIGH (ref 0–44)
AST: 78 U/L — ABNORMAL HIGH (ref 15–41)
Albumin: 2 g/dL — ABNORMAL LOW (ref 3.5–5.0)
Alkaline Phosphatase: 122 U/L (ref 38–126)
Anion gap: 9 (ref 5–15)
BUN: 15 mg/dL (ref 6–20)
CO2: 22 mmol/L (ref 22–32)
Calcium: 7.9 mg/dL — ABNORMAL LOW (ref 8.9–10.3)
Chloride: 104 mmol/L (ref 98–111)
Creatinine, Ser: 0.85 mg/dL (ref 0.61–1.24)
GFR calc Af Amer: >60 mL/min (ref >60)
GFR calc non Af Amer: >60 mL/min (ref >60)
Glucose, Bld: 125 mg/dL — ABNORMAL HIGH (ref 70–99)
Potassium: 3.8 mmol/L (ref 3.5–5.1)
Sodium: 135 mmol/L (ref 135–145)
Total Bilirubin: 4.1 mg/dL — ABNORMAL HIGH (ref 0.3–1.2)
Total Protein: 5.7 g/dL — ABNORMAL LOW (ref 6.5–8.1)

## 2020-02-27 LAB — MAGNESIUM: Magnesium: 2 mg/dL (ref 1.7–2.4)

## 2020-02-27 LAB — PROCALCITONIN: Procalcitonin: 2.52 ng/mL

## 2020-02-27 LAB — TRIGLYCERIDES, BODY FLUIDS: Triglycerides, Fluid: 92 mg/dL

## 2020-02-27 LAB — PROTIME-INR
INR: 1.3 — ABNORMAL HIGH (ref 0.8–1.2)
Prothrombin Time: 15.6 seconds — ABNORMAL HIGH (ref 11.4–15.2)

## 2020-02-27 LAB — LACTIC ACID, PLASMA: Lactic Acid, Venous: 2.5 mmol/L (ref 0.5–1.9)

## 2020-02-27 LAB — C-REACTIVE PROTEIN: CRP: 4.4 mg/dL — ABNORMAL HIGH (ref <1.0)

## 2020-02-27 LAB — BRAIN NATRIURETIC PEPTIDE: B Natriuretic Peptide: 645.6 pg/mL — ABNORMAL HIGH (ref 0.0–100.0)

## 2020-02-27 MED ORDER — SPIRONOLACTONE 25 MG PO TABS
25.0000 mg | ORAL_TABLET | Freq: Every day | ORAL | Status: DC
  Administered 2020-02-27 – 2020-03-03 (×6): 25 mg via ORAL
  Filled 2020-02-27 (×6): qty 1

## 2020-02-27 MED ORDER — FUROSEMIDE 20 MG PO TABS
20.0000 mg | ORAL_TABLET | Freq: Every day | ORAL | Status: DC
  Administered 2020-02-27 – 2020-03-05 (×8): 20 mg via ORAL
  Filled 2020-02-27 (×8): qty 1

## 2020-02-27 MED ORDER — LACTATED RINGERS IV SOLN: INTRAVENOUS | Status: AC

## 2020-02-27 NOTE — Progress Notes (Signed)
PROGRESS NOTE                                                                                                                                                                                                             Patient Demographics:    Micheal Watson, is a 59 y.o. male, DOB - October 11, 1960, VVO:[id number]  Admit date - 02/23/2020   Admitting Physician Karmen Bongo, MD  Outpatient Primary MD for the patient is Patient, No Pcp Per  LOS - 3  Chief Complaint  Patient presents with   Abdominal Pain   Jaundice       Brief Narrative Micheal Watson is a 59 y.o. male with medical history significant of ETOH dependence; recent acute cholecystitis/cholangitis s/p CBD stent and JP drain placement (removed since).  He is now presenting with recurrent LLQ abdomnial pain.   Subjective:   Patient in bed, appears comfortable, denies any headache, no fever, no chest pain or pressure, no shortness of breath , improved abdominal pain. No focal weakness.    Assessment  & Plan :      1.   Sepsis from SBP following intra-abdominal surgery with current hyperbilirubinemia and obstructed CBD stent- in a patient with following history  -Hx acute cholecystitis with hydrops; laparoscopic cholecystectomy with postoperative bile leak 11/22/2019 Dr. 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 Clarene Essex -Diagnostic laparoscopy, evacuation of bilious ascites(3L),JP drain placement 12/07/2019 Dr. Donne Hazel -Drain removal 12/23/2019 - ERCP by Dr. Paulita Fujita on 02/24/2020 with CBD biliary stent stricture, stent removal and new stent placed.  Biopsy sent. -Ultrasound-guided paracentesis on 02/24/2021 with 2.2 L of fluid removed - SBP  Sepsis due to polymicrobial gram-negative bacteremia caused by SBP, initial BCID positive for gram-negative rods with possible E. coli, Klebsiella and Proteus growing.  Abdominal fluid cultures consistent with SBP.  For now  continue Rocephin along with Flagyl.  GI general surgery following.  Case discussed with GI.  Continue IV fluids and supportive care.  Monitor blood cultures and inflammatory markers closely.  Sepsis pathophysiology is improving.  Recent Labs  Lab 02/23/20 1531 02/24/20 0407 02/24/20 0622 02/25/20 1158 02/26/20 0928 02/27/20 0505  WBC 16.0*  --   --  13.0* 13.6* 10.7*  PLT 249  --   --  226 159 299  CRP  --   --   --  18.0* 8.6* 4.4*  PROCALCITON  --   --   --  7.71 4.72 2.52  LATICACIDVEN  --  2.2* 1.9 3.1*  --  2.5*    HTN - He was prescribed Amlodipine prior but has not been taking it.  HLD - He was prescribed Lipitor prior but has not been taking it.  ETOH dependence - ongoing abuse, no signs of withdrawal on CIWA protocol.  Says he now drinks only 1-2 beers every weekend.  H/o COVID-19 infection in April 2020  -  Resolved.    Condition - Extremely Guarded  Family Communication  :  Son Baldemar Friday [phone] on 02/25/20 2.58 pm message left  Code Status :  Full  Consults  :  GI, CCS  Procedures  :    Ultrasound-guided paracentesis.  SBP.    CT -  1. New small ill-defined hypodensities inferiorly in the right hepatic lobe, nonspecific though infection/small abscesses are a concern with this history. 2. Increased small to moderate volume ascites. 3. Unchanged biliary stent with increased dilatation of the common hepatic duct. 4. Aortic Atherosclerosis  ERCP - with CBD stricture at the site of her stent, stent removed and new temporary stent placed on 02/24/2020 by Dr. Paulita Fujita.  PUD Prophylaxis : PPI  Disposition Plan  :    Status is: Inpatient  Remains inpatient appropriate because:IV treatments appropriate due to intensity of illness or inability to take PO   Dispo: The patient is from: Home              Anticipated d/c is to: Home              Anticipated d/c date is: 3 days              Patient currently is not medically stable to d/c.   DVT Prophylaxis  :  Heparin    Lab Results  Component Value Date   PLT 299 02/27/2020    Diet :  Diet Order            DIET SOFT Room service appropriate? No; Fluid consistency: Thin  Diet effective now                  Inpatient Medications Scheduled Meds:  chlordiazePOXIDE  15 mg Oral TID   folic acid  1 mg Oral Daily   furosemide  20 mg Oral Daily   heparin injection (subcutaneous)  5,000 Units Subcutaneous Q8H   multivitamin with minerals  1 tablet Oral Daily   pantoprazole  40 mg Oral Daily   sodium chloride flush  3 mL Intravenous Q12H   spironolactone  25 mg Oral Daily   thiamine  100 mg Oral Daily   Or   thiamine  100 mg Intravenous Daily   Continuous Infusions:  cefTRIAXone (ROCEPHIN)  IV 2 g (02/26/20 2126)   lactated ringers 75 mL/hr at 02/27/20 0741   metronidazole 500 mg (02/27/20 0743)   PRN Meds:.lidocaine, [DISCONTINUED] ondansetron **OR** ondansetron (ZOFRAN) IV  Antibiotics  :   Anti-infectives (From admission, onward)   Start     Dose/Rate Route Frequency Ordered Stop   02/25/20 2000  cefTRIAXone (ROCEPHIN) 2 g in sodium chloride 0.9 % 100 mL IVPB     Discontinue     2 g 200 mL/hr over 30 Minutes Intravenous Every 24 hours 02/24/20 1847     02/25/20 1515  metroNIDAZOLE (FLAGYL) IVPB 500 mg     Discontinue     500 mg 100 mL/hr over 60 Minutes Intravenous Every 8 hours 02/25/20 1455     02/24/20 1830  cefTRIAXone (ROCEPHIN) 2 g in sodium  chloride 0.9 % 100 mL IVPB  Status:  Discontinued        2 g 200 mL/hr over 30 Minutes Intravenous Every 24 hours 02/24/20 1741 02/24/20 1847   02/24/20 1200  piperacillin-tazobactam (ZOSYN) IVPB 3.375 g  Status:  Discontinued        3.375 g 100 mL/hr over 30 Minutes Intravenous Every 6 hours 02/24/20 0934 02/24/20 1009   02/24/20 1200  piperacillin-tazobactam (ZOSYN) IVPB 3.375 g  Status:  Discontinued        3.375 g 12.5 mL/hr over 240 Minutes Intravenous Every 8 hours 02/24/20 1009 02/24/20 1741   02/24/20 0245   piperacillin-tazobactam (ZOSYN) IVPB 3.375 g        3.375 g 100 mL/hr over 30 Minutes Intravenous  Once 02/24/20 0237 02/24/20 0535          Objective:   Vitals:   02/26/20 2019 02/27/20 0005 02/27/20 0351 02/27/20 0744  BP: (!) 131/59 (!) 147/77 (!) 140/83 (!) 140/87  Pulse: (!) 44 47 55 55  Resp: 20 19 17 18   Temp: 98.2 F (36.8 C) 98.7 F (37.1 C) 98.6 F (37 C) 98 F (36.7 C)  TempSrc: Oral Oral Axillary Oral  SpO2: 94% 93% 92% 96%  Weight:   (!) 97 kg   Height:        SpO2: 96 % O2 Flow Rate (L/min): 6 L/min  Wt Readings from Last 3 Encounters:  02/27/20 (!) 97 kg  12/24/19 88.2 kg  11/28/19 96.4 kg     Intake/Output Summary (Last 24 hours) at 02/27/2020 1105 Last data filed at 02/27/2020 0944 Gross per 24 hour  Intake 1857.54 ml  Output 2650 ml  Net -792.46 ml     Physical Exam  Awake Alert, No new F.N deficits, Normal affect [place].AT,PERRAL Supple Neck,No JVD, No cervical lymphadenopathy appriciated.  Symmetrical Chest wall movement, Good air movement bilaterally, CTAB RRR,No Gallops, Rubs or new Murmurs, No Parasternal Heave +ve B.Sounds, moderate ascites and left lower quadrant minimal tenderness,   No Cyanosis, Clubbing or edema, No new Rash or bruise     Data Review:    Recent Labs  Lab 02/23/20 1531 02/25/20 1158 02/26/20 0928 02/27/20 0505  WBC 16.0* 13.0* 13.6* 10.7*  HGB 14.2 13.8 14.4 14.6  HCT 39.7 38.3* 40.9 42.1  PLT 249 226 159 299  MCV 93.2 90.3 92.1 93.8  MCH 33.3 32.5 32.4 32.5  MCHC 35.8 36.0 35.2 34.7  RDW 16.2* 16.3* 16.8* 17.2*  LYMPHSABS  --   --  1.4 2.3  MONOABS  --   --  0.6 0.9  EOSABS  --   --  0.0 0.0  BASOSABS  --   --  0.1 0.0    Recent Labs  Lab 02/23/20 1531 02/24/20 0927 02/25/20 1158 02/26/20 0928 02/27/20 0505  NA 123*  --  131* 133* 135  K 3.9  --  3.5 4.0 3.8  CL 90*  --  100 104 104  CO2 20*  --  19* 19* 22  GLUCOSE 139*  --  247* 210* 125*  BUN 14  --  29* 21* 15  CREATININE 1.10  --   1.16 0.97 0.85  CALCIUM 8.3*  --  7.7* 7.9* 7.9*  AST 93*  --  48* 50* 78*  ALT 50*  --  39 38 47*  ALKPHOS 241*  --  177* 150* 122  BILITOT 19.3*  --  9.5* 5.5* 4.1*  ALBUMIN 2.5*  --  1.9* 2.3* 2.0*  MG  --   --  2.5* 2.4 2.0  CRP  --   --  18.0* 8.6* 4.4*  PROCALCITON  --   --  7.71 4.72 2.52  INR  --  1.3* 1.3* 1.2 1.3*  BNP  --   --  380.8* 376.9* 645.6*    Recent Labs  Lab 02/24/20 0604 02/25/20 1158 02/26/20 0928 02/27/20 0505  CRP  --  18.0* 8.6* 4.4*  BNP  --  380.8* 376.9* 645.6*  PROCALCITON  --  7.71 4.72 2.52  SARSCOV2NAA NEGATIVE  --   --   --     ------------------------------------------------------------------------------------------------------------------ No results for input(s): CHOL, HDL, LDLCALC, TRIG, CHOLHDL, LDLDIRECT in the last 72 hours.  Lab Results  Component Value Date   HGBA1C 6.0 (H) 04/10/2019   ------------------------------------------------------------------------------------------------------------------ No results for input(s): TSH, T4TOTAL, T3FREE, THYROIDAB in the last 72 hours.  Invalid input(s): FREET3 ------------------------------------------------------------------------------------------------------------------ No results for input(s): VITAMINB12, FOLATE, FERRITIN, TIBC, IRON, RETICCTPCT in the last 72 hours.  Coagulation profile Recent Labs  Lab 02/24/20 0927 02/25/20 1158 02/26/20 0928 02/27/20 0505  INR 1.3* 1.3* 1.2 1.3*    No results for input(s): DDIMER in the last 72 hours.  Cardiac Enzymes No results for input(s): CKMB, TROPONINI, MYOGLOBIN in the last 168 hours.  Invalid input(s): CK ------------------------------------------------------------------------------------------------------------------    Component Value Date/Time   BNP 645.6 (H) 02/27/2020 0505    Micro Results Recent Results (from the past 240 hour(s))  Blood culture (routine x 2)     Status: Abnormal   Collection Time: 02/24/20  2:47  AM   Specimen: BLOOD  Result Value Ref Range Status   Specimen Description BLOOD LEFT ANTECUBITAL  Final   Special Requests   Final    BOTTLES DRAWN AEROBIC AND ANAEROBIC Blood Culture adequate volume   Culture  Setup Time   Final    GRAM NEGATIVE RODS IN BOTH AEROBIC AND ANAEROBIC BOTTLES CRITICAL RESULT CALLED TO, READ BACK BY AND VERIFIED WITH: PHARMD MIHN P. [phone] FCP Performed at [facility], [place] [address], [place], [place] [address]    Culture (A)  Final    ESCHERICHIA COLI KLEBSIELLA PNEUMONIAE PROTEUS PENNERI    Report Status 02/27/2020 FINAL  Final   Organism ID, Bacteria ESCHERICHIA COLI  Final   Organism ID, Bacteria KLEBSIELLA PNEUMONIAE  Final   Organism ID, Bacteria PROTEUS PENNERI  Final      Susceptibility   Escherichia coli - MIC*    AMPICILLIN <=2 SENSITIVE Sensitive     CEFAZOLIN <=4 SENSITIVE Sensitive     CEFEPIME <=0.12 SENSITIVE Sensitive     CEFTAZIDIME <=1 SENSITIVE Sensitive     CEFTRIAXONE <=0.25 SENSITIVE Sensitive     CIPROFLOXACIN <=0.25 SENSITIVE Sensitive     GENTAMICIN <=1 SENSITIVE Sensitive     IMIPENEM <=0.25 SENSITIVE Sensitive     TRIMETH/SULFA <=20 SENSITIVE Sensitive     AMPICILLIN/SULBACTAM <=2 SENSITIVE Sensitive     PIP/TAZO <=4 SENSITIVE Sensitive     * ESCHERICHIA COLI   Klebsiella pneumoniae - MIC*    AMPICILLIN >=32 RESISTANT Resistant     CEFAZOLIN <=4 SENSITIVE Sensitive     CEFEPIME <=0.12 SENSITIVE Sensitive     CEFTAZIDIME <=1 SENSITIVE Sensitive     CEFTRIAXONE <=0.25 SENSITIVE Sensitive     CIPROFLOXACIN <=0.25 SENSITIVE Sensitive     GENTAMICIN <=1 SENSITIVE Sensitive     IMIPENEM <=0.25 SENSITIVE Sensitive     TRIMETH/SULFA <=20 SENSITIVE Sensitive     AMPICILLIN/SULBACTAM 4  SENSITIVE Sensitive     PIP/TAZO <=4 SENSITIVE Sensitive     * KLEBSIELLA PNEUMONIAE   Proteus penneri - MIC*    AMPICILLIN >=32 RESISTANT Resistant     CEFAZOLIN >=64 RESISTANT Resistant     CEFEPIME <=0.12 SENSITIVE  Sensitive     CEFTAZIDIME <=1 SENSITIVE Sensitive     CEFTRIAXONE <=0.25 SENSITIVE Sensitive     CIPROFLOXACIN <=0.25 SENSITIVE Sensitive     GENTAMICIN <=1 SENSITIVE Sensitive     IMIPENEM 2 SENSITIVE Sensitive     TRIMETH/SULFA <=20 SENSITIVE Sensitive     AMPICILLIN/SULBACTAM 16 INTERMEDIATE Intermediate     PIP/TAZO <=4 SENSITIVE Sensitive     * PROTEUS PENNERI  Blood Culture ID Panel (Reflexed)     Status: Abnormal   Collection Time: 02/24/20  2:47 AM  Result Value Ref Range Status   Enterococcus species NOT DETECTED NOT DETECTED Final   Listeria monocytogenes NOT DETECTED NOT DETECTED Final   Staphylococcus species NOT DETECTED NOT DETECTED Final   Staphylococcus aureus (BCID) NOT DETECTED NOT DETECTED Final   Streptococcus species NOT DETECTED NOT DETECTED Final   Streptococcus agalactiae NOT DETECTED NOT DETECTED Final   Streptococcus pneumoniae NOT DETECTED NOT DETECTED Final   Streptococcus pyogenes NOT DETECTED NOT DETECTED Final   Acinetobacter baumannii NOT DETECTED NOT DETECTED Final   Enterobacteriaceae species DETECTED (A) NOT DETECTED Final    Comment: CRITICAL RESULT CALLED TO, READ BACK BY AND VERIFIED WITH: PHARMD MIHN P. [phone] FCP    Enterobacter cloacae complex NOT DETECTED NOT DETECTED Final   Escherichia coli DETECTED (A) NOT DETECTED Final    Comment: CRITICAL RESULT CALLED TO, READ BACK BY AND VERIFIED WITH: PHARMD MIHN P. [phone] FCP    Klebsiella oxytoca NOT DETECTED NOT DETECTED Final   Klebsiella pneumoniae DETECTED (A) NOT DETECTED Final    Comment: CRITICAL RESULT CALLED TO, READ BACK BY AND VERIFIED WITH: PHARMD MIHN P. [phone] FCP    Proteus species DETECTED (A) NOT DETECTED Final    Comment: CRITICAL RESULT CALLED TO, READ BACK BY AND VERIFIED WITH: PHARMD MIHN P. [phone] FCP    Serratia marcescens NOT DETECTED NOT DETECTED Final   Carbapenem resistance NOT DETECTED NOT DETECTED Final   Haemophilus influenzae NOT DETECTED  NOT DETECTED Final   Neisseria meningitidis NOT DETECTED NOT DETECTED Final   Pseudomonas aeruginosa NOT DETECTED NOT DETECTED Final   Candida albicans NOT DETECTED NOT DETECTED Final   Candida glabrata NOT DETECTED NOT DETECTED Final   Candida krusei NOT DETECTED NOT DETECTED Final   Candida parapsilosis NOT DETECTED NOT DETECTED Final   Candida tropicalis NOT DETECTED NOT DETECTED Final    Comment: Performed at [facility], [place]. [address] [place], [place] [address]  Blood culture (routine x 2)     Status: Abnormal   Collection Time: 02/24/20  3:56 AM   Specimen: BLOOD LEFT HAND  Result Value Ref Range Status   Specimen Description BLOOD LEFT HAND  Final   Special Requests   Final    BOTTLES DRAWN AEROBIC ONLY Blood Culture results may not be optimal due to an inadequate volume of blood received in culture bottles   Culture  Setup Time   Final    AEROBIC BOTTLE ONLY GRAM NEGATIVE RODS CRITICAL VALUE NOTED.  VALUE IS CONSISTENT WITH PREVIOUSLY REPORTED AND CALLED VALUE.    Culture (A)  Final    PROTEUS PENNERI SUSCEPTIBILITIES PERFORMED ON PREVIOUS CULTURE WITHIN THE LAST 5 DAYS. Performed  at [facility], [place] [address] [place] [address], [place], [place] [address]    Report Status 02/27/2020 FINAL  Final  SARS Coronavirus 2 by RT PCR (hospital order, performed in [facility] hospital lab) Nasopharyngeal Nasopharyngeal Swab     Status: None   Collection Time: 02/24/20  6:04 AM   Specimen: Nasopharyngeal Swab  Result Value Ref Range Status   SARS Coronavirus 2 NEGATIVE NEGATIVE Final    Comment: (NOTE) SARS-CoV-2 target nucleic acids are NOT DETECTED.  The SARS-CoV-2 RNA is generally detectable in upper and lower respiratory specimens during the acute phase of infection. The lowest concentration of SARS-CoV-2 viral copies this assay can detect is 250 copies / mL. A negative result does not preclude SARS-CoV-2 infection and should not be used as the sole basis for treatment or  other patient management decisions.  A negative result may occur with improper specimen collection / handling, submission of specimen other than nasopharyngeal swab, presence of viral mutation(s) within the areas targeted by this assay, and inadequate number of viral copies (<250 copies / mL). A negative result must be combined with clinical observations, patient history, and epidemiological information.  Fact Sheet for Patients:   StrictlyIdeas.no  Fact Sheet for Healthcare Providers: BankingDealers.co.za  This test is not yet approved or  cleared by the [place] FDA and has been authorized for detection and/or diagnosis of SARS-CoV-2 by FDA under an Emergency Use Authorization (EUA).  This EUA will remain in effect (meaning this test can be used) for the duration of the COVID-19 declaration under Section 564(b)(1) of the Act, 21 U.S.C. section 360bbb-3(b)(1), unless the authorization is terminated or revoked sooner.  Performed at [facility], [place] [address] [place], [place] [address]   C Difficile Quick Screen w PCR reflex     Status: None   Collection Time: 02/25/20  4:21 AM   Specimen: STOOL  Result Value Ref Range Status   C Diff antigen NEGATIVE NEGATIVE Final   C Diff toxin NEGATIVE NEGATIVE Final   C Diff interpretation No C. difficile detected.  Final    Comment: Performed at [facility], [place] [address], [place], [place] [address]  Gastrointestinal Panel by PCR , Stool     Status: None   Collection Time: 02/25/20  4:21 AM   Specimen: STOOL  Result Value Ref Range Status   Campylobacter species NOT DETECTED NOT DETECTED Final   Plesimonas shigelloides NOT DETECTED NOT DETECTED Final   Salmonella species NOT DETECTED NOT DETECTED Final   Yersinia enterocolitica NOT DETECTED NOT DETECTED Final   Vibrio species NOT DETECTED NOT DETECTED Final   Vibrio cholerae NOT DETECTED NOT DETECTED Final    Enteroaggregative E coli (EAEC) NOT DETECTED NOT DETECTED Final   Enteropathogenic E coli (EPEC) NOT DETECTED NOT DETECTED Final   Enterotoxigenic E coli (ETEC) NOT DETECTED NOT DETECTED Final   Shiga like toxin producing E coli (STEC) NOT DETECTED NOT DETECTED Final   Shigella/Enteroinvasive E coli (EIEC) NOT DETECTED NOT DETECTED Final   Cryptosporidium NOT DETECTED NOT DETECTED Final   Cyclospora cayetanensis NOT DETECTED NOT DETECTED Final   Entamoeba histolytica NOT DETECTED NOT DETECTED Final   Giardia lamblia NOT DETECTED NOT DETECTED Final   Adenovirus F40/41 NOT DETECTED NOT DETECTED Final   Astrovirus NOT DETECTED NOT DETECTED Final   Norovirus GI/GII NOT DETECTED NOT DETECTED Final   Rotavirus A NOT DETECTED NOT DETECTED Final   Sapovirus (I, II, IV, and V) NOT DETECTED NOT  DETECTED Final    Comment: Performed at [facility], [place] [place], [place] [address]  MRSA PCR Screening     Status: None   Collection Time: 02/25/20  4:57 AM  Result Value Ref Range Status   MRSA by PCR NEGATIVE NEGATIVE Final    Comment:        The GeneXpert MRSA Assay (FDA approved for NASAL specimens only), is one component of a comprehensive MRSA colonization surveillance program. It is not intended to diagnose MRSA infection nor to guide or monitor treatment for MRSA infections. Performed at [facility], [place] [address] [place], [place] [address]   Gram stain     Status: None   Collection Time: 02/25/20  3:21 PM   Specimen: Abdomen  Result Value Ref Range Status   Specimen Description ABDOMEN  Final   Special Requests PERITONEAL FLUID  Final   Gram Stain   Final    ABUNDANT WBC PRESENT, PREDOMINANTLY PMN NO ORGANISMS SEEN Performed at [facility], [place] [address] [place], [place] [address]    Report Status 02/25/2020 FINAL  Final  Culture, body fluid-bottle     Status: None (Preliminary result)   Collection Time: 02/25/20  3:21 PM   Specimen: Abdomen   Result Value Ref Range Status   Specimen Description ABDOMEN  Final   Special Requests PERITONEAL FLUID  Final   Culture   Final    NO GROWTH 2 DAYS Performed at [place] [address], [place], [place] [address]    Report Status PENDING  Incomplete    Radiology Reports CT ABDOMEN PELVIS W CONTRAST  Result Date: 02/24/2020 CLINICAL DATA:  Generalized abdominal pain, swelling, and jaundice with vomiting and fever. Leukocytosis. EXAM: CT ABDOMEN AND PELVIS WITH CONTRAST TECHNIQUE: Multidetector CT imaging of the abdomen and pelvis was performed using the standard protocol following bolus administration of intravenous contrast. CONTRAST:  155mL OMNIPAQUE IOHEXOL 300 MG/ML  SOLN COMPARISON:  12/24/2019 FINDINGS: Lower chest: Punctate calcified nodules in the right middle lobe and right lower lobe. No basilar lung consolidation or pleural effusion. Hepatobiliary: There is a new 1.2 cm hypodense lesion inferiorly in the right hepatic lobe (series 3, image 63), and there are additional more subtle subcentimeter hypodensities in the inferior right hepatic lobe which also appear new (for example series 3, images 56, 58, 59, and 62). Status post cholecystectomy. A biliary stent remains in place without significant dilatation of the common bile duct, however there is increased dilatation of the common hepatic duct about the proximal aspect of the stent measuring 2.1 cm in diameter. There is at most minimal central intrahepatic biliary dilatation. Pancreas: Unremarkable. Spleen: No focal lesion. Unchanged cleft along the posterior splenic margin. Adrenals/Urinary Tract: Unremarkable adrenal glands. No evidence of renal mass, calculi, or hydronephrosis. Unremarkable bladder. Stomach/Bowel: The stomach is unremarkable. There is no evidence of bowel obstruction. Mildly prominent gaseous distension of the appendix without evidence of appendicitis. Vascular/Lymphatic: No significant vascular findings are  present. No enlarged abdominal or pelvic lymph nodes. Reproductive: Mildly enlarged prostate. Other: Increased small to moderate volume ascites. No loculated fluid collection. No pneumoperitoneum. Small umbilical hernia containing fat and fluid. Musculoskeletal: No acute osseous abnormality or suspicious osseous lesion. IMPRESSION: 1. New small ill-defined hypodensities inferiorly in the right hepatic lobe, nonspecific though infection/small abscesses are a concern with this history. 2. Increased small to moderate volume ascites. 3. Unchanged biliary stent with increased dilatation of the common hepatic duct. 4.  Aortic Atherosclerosis (ICD10-I70.0). Electronically Signed   By: Logan Bores M.D.   On: 02/24/2020 05:42   DG Chest Port 1 View  Result Date: 02/27/2020 CLINICAL DATA:  Shortness of breath.  Sepsis. EXAM: PORTABLE CHEST 1 VIEW COMPARISON:  12/03/2019 FINDINGS: Lordotic technique is demonstrated. Lungs are adequately inflated without focal airspace consolidation or effusion. Borderline stable cardiomegaly. Remainder of the exam is unchanged. IMPRESSION: No acute cardiopulmonary disease. Electronically Signed   By: Marin Olp M.D.   On: 02/27/2020 09:13   DG ERCP BILIARY & PANCREATIC DUCTS  Result Date: 02/24/2020 CLINICAL DATA:  ERCP. EXAM: ERCP TECHNIQUE: Multiple spot images obtained with the fluoroscopic device and submitted for interpretation post-procedure. FLUOROSCOPY TIME:  Fluoroscopy Time:  2 minutes and 13 seconds Radiation Exposure Index (if provided by the fluoroscopic device): 66.82 mGy Number of Acquired Spot Images: 6 COMPARISON:  CT dated February 24, 2020.  11/29/2019 FINDINGS: And insult spot images demonstrate a common bile duct stent. The stent has been removed. Injection of contrast followed by balloon sweep demonstrates a persistent narrowing of the mid and distal common bile duct. There appears to be some mild intrahepatic biliary ductal dilatation. The patient is status post  prior cholecystectomy. Final images demonstrate replacement of the plastic biliary stent. IMPRESSION: Status post ERCP as detailed above. These images were submitted for radiologic interpretation only. Please see the procedural report for the amount of contrast and the fluoroscopy time utilized. Electronically Signed   By: Constance Holster M.D.   On: 02/24/2020 18:49   US Abdomen Limited RUQ  Result Date: 02/24/2020 CLINICAL DATA:  Jaundice EXAM: ULTRASOUND ABDOMEN LIMITED RIGHT UPPER QUADRANT COMPARISON:  12/24/2019 FINDINGS: Gallbladder: Surgically absent Common bile duct: Diameter: 8 mm Liver: Nodularity of the liver capsule compatible with cirrhosis. No focal parenchymal abnormality. No intrahepatic duct dilation. Portal vein is patent on color Doppler imaging with normal direction of blood flow towards the liver. Other: There is a small amount of ascites throughout the abdomen, greatest in the lower quadrants. IMPRESSION: 1. Small volume ascites. 2. Findings consistent with hepatic cirrhosis. 3. Cholecystectomy. Electronically Signed   By: Randa Ngo M.D.   On: 02/24/2020 03:23   IR Paracentesis  Result Date: 02/25/2020 INDICATION: Patient with a history of alcoholic hepatitis and ascites presents today for a therapeutic and diagnostic paracentesis. EXAM: ULTRASOUND GUIDED PARACENTESIS MEDICATIONS: 1% lidocaine 10 mL COMPLICATIONS: None immediate PROCEDURE: Informed written consent was obtained from the patient after a discussion of the risks, benefits and alternatives to treatment. A timeout was performed prior to the initiation of the procedure. Initial ultrasound scanning demonstrates a large amount of ascites within the left lower abdominal quadrant. The left lower abdomen was prepped and draped in the usual sterile fashion. 1% lidocaine was used for local anesthesia. Following this, a 19 gauge, 7-cm, Yueh catheter was introduced. An ultrasound image was saved for documentation purposes. The  paracentesis was performed. The catheter was removed and a dressing was applied. The patient tolerated the procedure well without immediate post procedural complication. FINDINGS: A total of approximately 2.2 L of dark yellow fluid was removed. Samples were sent to the laboratory as requested by the clinical team. IMPRESSION: Successful ultrasound-guided paracentesis yielding 2.2 liters of peritoneal fluid. Read by: Soyla Dryer, NP Electronically Signed   By: Lucrezia Europe M.D.   On: 02/25/2020 14:28    Time Spent in minutes  30   Lala Lund M.D on 02/27/2020 at 11:05 AM  To page go to [email or web address] -  password [facility]

## 2020-02-27 NOTE — Progress Notes (Signed)
[facility] Progress Note  Kieffer Melgarejo 59 y.o. 1961-03-29  CC: Abdominal pain, bacteremia, SBP   Subjective:  interpreter software used for history taking.  Patient sitting in recliner.  Continues to have generalized abdominal pain.  Had an episode of bradycardia yesterday which has resolved now.  -Paracentesis on February 25, 2020 with removal of 2.2 L of fluid.  ROS : Afebrile, negative for chest pain   Objective: Vital signs in last 24 hours: Vitals:   02/27/20 0351 02/27/20 0744  BP: (!) 140/83 (!) 140/87  Pulse: 55 55  Resp: 17 18  Temp: 98.6 F (37 C) 98 F (36.7 C)  SpO2: 92% 96%    Physical Exam:  General:  Alert, cooperative, no distress, appears stated age  Head:  Normocephalic, without obvious abnormality, atraumatic  Eyes:  , EOM's intact,   Lungs:    No visible respiratory distress noted  Heart:  Regular rate and rhythm, S1, S2 normal  Abdomen:    Abdomen is more distended with generalized tenderness to palpation, bowel sounds present, generalized guarding noted but no rebound.  Extremities: Extremities normal, atraumatic, no  edema       Lab Results: Recent Labs    02/26/20 0928 02/27/20 0505  NA 133* 135  K 4.0 3.8  CL 104 104  CO2 19* 22  GLUCOSE 210* 125*  BUN 21* 15  CREATININE 0.97 0.85  CALCIUM 7.9* 7.9*  MG 2.4 2.0   Recent Labs    02/26/20 0928 02/27/20 0505  AST 50* 78*  ALT 38 47*  ALKPHOS 150* 122  BILITOT 5.5* 4.1*  PROT 6.7 5.7*  ALBUMIN 2.3* 2.0*   Recent Labs    02/26/20 0928 02/27/20 0505  WBC 13.6* 10.7*  NEUTROABS 11.5* 7.4  HGB 14.4 14.6  HCT 40.9 42.1  MCV 92.1 93.8  PLT 159 299   Recent Labs    02/26/20 0928 02/27/20 0505  LABPROT 14.8 15.6*  INR 1.2 1.3*      Assessment/Plan: -Elevated LFTs.  Improving.  Multifactorial from cirrhosis along with alcohol use and history of biliary stricture. -Polymicrobial bacteremia along with SBP. -History of laparoscopic cholecystectomy with  postoperative bile leak in April 2021.  Follow-up ERCP with stent placement on November 29, 2019.  Had diagnostic laparoscopy in May 2021.  Another ERCP on February 24, 2020-previously placed stent was partially occluded which was removed.   Another 10 French 7 cm plastic stent was placed.  Cytology showed atypical cells.  Recommendations ----------------------- -Had paracentesis 07/23 with removal of 2.2 L of fluid.  Fluid analysis positive for SBP.  Patient already on antibiotics for polymicrobial bacteremia. -His abdomen is more distended today.  Start low-dose diuretics Lasix 20 mg and spironolactone 25 mg, uptitrate diuretics slowly.  - Consider another paracentesis tomorrow.  -May need outpatient EUS along with ERCP / cholangioscopy for evaluation of biliary stricture depending on overall clinical condition -GI will follow  Otis Brace MD, FACP 02/27/2020, 11:02 AM  Contact #  [phone]

## 2020-02-28 ENCOUNTER — Inpatient Hospital Stay (HOSPITAL_COMMUNITY): Payer: Self-pay

## 2020-02-28 HISTORY — PX: IR PARACENTESIS: IMG2679

## 2020-02-28 LAB — CBC WITH DIFFERENTIAL/PLATELET
Abs Immature Granulocytes: 0.17 10*3/uL — ABNORMAL HIGH (ref 0.00–0.07)
Basophils Absolute: 0 10*3/uL (ref 0.0–0.1)
Basophils Relative: 0 %
Eosinophils Absolute: 0.1 10*3/uL (ref 0.0–0.5)
Eosinophils Relative: 1 %
HCT: 43.4 % (ref 39.0–52.0)
Hemoglobin: 14.9 g/dL (ref 13.0–17.0)
Immature Granulocytes: 1 %
Lymphocytes Relative: 22 %
Lymphs Abs: 2.7 10*3/uL (ref 0.7–4.0)
MCH: 32.3 pg (ref 26.0–34.0)
MCHC: 34.3 g/dL (ref 30.0–36.0)
MCV: 93.9 fL (ref 80.0–100.0)
Monocytes Absolute: 1.1 10*3/uL — ABNORMAL HIGH (ref 0.1–1.0)
Monocytes Relative: 9 %
Neutro Abs: 8 10*3/uL — ABNORMAL HIGH (ref 1.7–7.7)
Neutrophils Relative %: 67 %
Platelets: 311 10*3/uL (ref 150–400)
RBC: 4.62 MIL/uL (ref 4.22–5.81)
RDW: 17.1 % — ABNORMAL HIGH (ref 11.5–15.5)
WBC: 12.2 10*3/uL — ABNORMAL HIGH (ref 4.0–10.5)
nRBC: 0 % (ref 0.0–0.2)

## 2020-02-28 LAB — COMPREHENSIVE METABOLIC PANEL
ALT: 44 U/L (ref 0–44)
AST: 62 U/L — ABNORMAL HIGH (ref 15–41)
Albumin: 2 g/dL — ABNORMAL LOW (ref 3.5–5.0)
Alkaline Phosphatase: 110 U/L (ref 38–126)
Anion gap: 9 (ref 5–15)
BUN: 15 mg/dL (ref 6–20)
CO2: 25 mmol/L (ref 22–32)
Calcium: 7.9 mg/dL — ABNORMAL LOW (ref 8.9–10.3)
Chloride: 102 mmol/L (ref 98–111)
Creatinine, Ser: 1.01 mg/dL (ref 0.61–1.24)
GFR calc Af Amer: >60 mL/min (ref >60)
GFR calc non Af Amer: >60 mL/min (ref >60)
Glucose, Bld: 108 mg/dL — ABNORMAL HIGH (ref 70–99)
Potassium: 3.7 mmol/L (ref 3.5–5.1)
Sodium: 136 mmol/L (ref 135–145)
Total Bilirubin: 3.3 mg/dL — ABNORMAL HIGH (ref 0.3–1.2)
Total Protein: 5.5 g/dL — ABNORMAL LOW (ref 6.5–8.1)

## 2020-02-28 LAB — PROTIME-INR
INR: 1.2 (ref 0.8–1.2)
Prothrombin Time: 14.9 seconds (ref 11.4–15.2)

## 2020-02-28 LAB — MAGNESIUM: Magnesium: 1.6 mg/dL — ABNORMAL LOW (ref 1.7–2.4)

## 2020-02-28 LAB — PROCALCITONIN: Procalcitonin: 2.52 ng/mL

## 2020-02-28 LAB — C-REACTIVE PROTEIN: CRP: 3.6 mg/dL — ABNORMAL HIGH (ref <1.0)

## 2020-02-28 LAB — BRAIN NATRIURETIC PEPTIDE: B Natriuretic Peptide: 133.2 pg/mL — ABNORMAL HIGH (ref 0.0–100.0)

## 2020-02-28 LAB — PATHOLOGIST SMEAR REVIEW

## 2020-02-28 MED ORDER — MAGNESIUM SULFATE 2 GM/50ML IV SOLN
2.0000 g | Freq: Once | INTRAVENOUS | Status: AC
  Administered 2020-02-28: 2 g via INTRAVENOUS
  Filled 2020-02-28: qty 50

## 2020-02-28 MED ORDER — LACTATED RINGERS IV SOLN: INTRAVENOUS | Status: DC

## 2020-02-28 MED ORDER — IOHEXOL 300 MG/ML  SOLN
100.0000 mL | Freq: Once | INTRAMUSCULAR | Status: AC | PRN
  Administered 2020-02-28: 100 mL via INTRAVENOUS

## 2020-02-28 MED ORDER — ALBUMIN HUMAN 25 % IV SOLN
50.0000 g | Freq: Once | INTRAVENOUS | Status: DC | PRN
  Filled 2020-02-28: qty 200

## 2020-02-28 MED ORDER — LIDOCAINE HCL 1 % IJ SOLN
INTRAMUSCULAR | Status: DC | PRN
  Administered 2020-02-28: 10 mL

## 2020-02-28 MED ORDER — HYDROCODONE-ACETAMINOPHEN 5-325 MG PO TABS
1.0000 | ORAL_TABLET | ORAL | Status: DC | PRN
  Administered 2020-02-28 – 2020-03-15 (×6): 1 via ORAL
  Filled 2020-02-28 (×6): qty 1

## 2020-02-28 MED ORDER — LIDOCAINE HCL 1 % IJ SOLN
INTRAMUSCULAR | Status: AC
  Filled 2020-02-28: qty 20

## 2020-02-28 NOTE — Plan of Care (Addendum)
Patient A&O x4, spanish speaking translator used. VSS.  CIWA protocol, no ativan needed. Free from falls. Pt turns self in bed. Ambulated to chair standy. Pt tolerating fluids and meals. MIVF. PRN given for pain. Pt voiding adequately during shift.DVT prophylactic: heparin. patient had paracentesis 1.9 L of fluid removed and had CT abdomen today. Bed wheels locked. Phone and call bell within reach. Pt is resting, no distress. POC reviewed.    Problem: Education: Goal: Knowledge of General Education information will improve Description: Including pain rating scale, medication(s)/side effects and non-pharmacologic comfort measures Outcome: Progressing   Problem: Health Behavior/Discharge Planning: Goal: Ability to manage health-related needs will improve Outcome: Progressing   Problem: Clinical Measurements: Goal: Ability to maintain clinical measurements within normal limits will improve Outcome: Progressing Goal: Will remain free from infection Outcome: Progressing Goal: Diagnostic test results will improve Outcome: Progressing Goal: Respiratory complications will improve Outcome: Progressing Goal: Cardiovascular complication will be avoided Outcome: Progressing   Problem: Activity: Goal: Risk for activity intolerance will decrease Outcome: Progressing   Problem: Nutrition: Goal: Adequate nutrition will be maintained Outcome: Progressing   Problem: Coping: Goal: Level of anxiety will decrease Outcome: Progressing   Problem: Elimination: Goal: Will not experience complications related to bowel motility Outcome: Progressing Goal: Will not experience complications related to urinary retention Outcome: Progressing   Problem: Pain Managment: Goal: General experience of comfort will improve Outcome: Progressing   Problem: Safety: Goal: Ability to remain free from injury will improve Outcome: Progressing   Problem: Skin Integrity: Goal: Risk for impaired skin integrity will  decrease Outcome: Progressing

## 2020-02-28 NOTE — Progress Notes (Addendum)
PROGRESS NOTE                                                                                                                                                                                                             Patient Demographics:    Micheal Watson, is a 59 y.o. male, DOB - 07-31-1961, LEX:[id number]  Admit date - 02/23/2020   Admitting Physician Karmen Bongo, MD  Outpatient Primary MD for the patient is Patient, No Pcp Per  LOS - 4  Chief Complaint  Patient presents with  . Abdominal Pain  . Jaundice       Brief Narrative Micheal Watson is a 59 y.o. male with medical history significant of ETOH dependence; recent acute cholecystitis/cholangitis s/p CBD stent and JP drain placement (removed since).  He is now presenting with recurrent LLQ abdomnial pain.   Subjective:   Patient in bed, appears comfortable, denies any headache, no fever, no chest pain or pressure, no shortness of breath , improved abdominal pain. No focal weakness.    Assessment  & Plan :      1.   Sepsis from SBP following intra-abdominal surgery with current hyperbilirubinemia and obstructed CBD stent along with small Hepatic abscess - in a patient with following history  -Hx acute cholecystitis with hydrops; laparoscopic cholecystectomy with postoperative bile leak 11/22/2019 Dr. 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 Clarene Essex -Diagnostic laparoscopy, evacuation of bilious ascites(3L),JP drain placement 12/07/2019 Dr. Donne Hazel -Drain removal 12/23/2019 - ERCP by Dr. Paulita Fujita on 02/24/2020 with CBD biliary stent stricture, stent removal and new stent placed.  Biopsy sent. -Ultrasound-guided paracentesis on 02/24/2021 with 2.2 L of fluid removed - SBP -Repeat therapeutic ultrasound ordered on 02/28/2020 -Repeat CT scan of abdomen pelvis ordered on 02/28/2020 for continued left lower quadrant abdominal pain and tenderness   Sepsis due to  polymicrobial gram-negative bacteremia caused by SBP/ ? Hepatic abscesses, initial BCID positive for gram-negative rods with possible E. coli, Klebsiella and Proteus growing.  Abdominal fluid cultures consistent with SBP.  Since he seems to have developed moderate ascites again repeat therapeutic paracentesis will be ordered on 02/28/2020 with albumin, for his continued left lower quadrant abdominal pain will repeat CT scan as well.  For now continue Rocephin along with Flagyl.  Has been seen by GI and general surgery this admission.  Case discussed with GI again on 02/28/2020.  Continue supportive care.  Monitor blood cultures and inflammatory markers closely.  Sepsis pathophysiology is improving.  Recent Labs  Lab 02/23/20 1531 02/24/20 0407 02/24/20 0622 02/25/20 1158 02/26/20 0928 02/27/20 0505 02/28/20 0401  WBC 16.0*  --   --  13.0* 13.6* 10.7* 12.2*  PLT 249  --   --  226 159 299 311  CRP  --   --   --  18.0* 8.6* 4.4* 3.6*  PROCALCITON  --   --   --  7.71 4.72 2.52 2.52  LATICACIDVEN  --  2.2* 1.9 3.1*  --  2.5*  --     HTN - He was prescribed Amlodipine prior but has not been taking it.  HLD - He was prescribed Lipitor prior but has not been taking it.  ETOH dependence - ongoing abuse, no signs of withdrawal on CIWA protocol.  Says he now drinks only 1-2 beers every weekend.  H/o COVID-19 infection in April 2020  -  Resolved.    Condition - Extremely Guarded  Family Communication  :  Son Baldemar Friday [phone] on 02/25/20 2.58 pm message left  Code Status :  Full  Consults  :  GI, CCS  Procedures  :    CT  Ultrasound-guided paracentesis.  SBP.    CT -  1. New small ill-defined hypodensities inferiorly in the right hepatic lobe, nonspecific though infection/small abscesses are a concern with this history. 2. Increased small to moderate volume ascites. 3. Unchanged biliary stent with increased dilatation of the common hepatic duct. 4. Aortic Atherosclerosis  ERCP - with  CBD stricture at the site of her stent, stent removed and new temporary stent placed on 02/24/2020 by Dr. Paulita Fujita.  PUD Prophylaxis : PPI  Disposition Plan  :    Status is: Inpatient  Remains inpatient appropriate because:IV treatments appropriate due to intensity of illness or inability to take PO   Dispo: The patient is from: Home              Anticipated d/c is to: Home              Anticipated d/c date is: 3 days              Patient currently is not medically stable to d/c.   DVT Prophylaxis  : Heparin    Lab Results  Component Value Date   PLT 311 02/28/2020    Diet :  Diet Order            Diet clear liquid Room service appropriate? Yes; Fluid consistency: Thin  Diet effective now                  Inpatient Medications Scheduled Meds: . folic acid  1 mg Oral Daily  . furosemide  20 mg Oral Daily  . heparin injection (subcutaneous)  5,000 Units Subcutaneous Q8H  . multivitamin with minerals  1 tablet Oral Daily  . pantoprazole  40 mg Oral Daily  . sodium chloride flush  3 mL Intravenous Q12H  . spironolactone  25 mg Oral Daily  . thiamine  100 mg Oral Daily   Or  . thiamine  100 mg Intravenous Daily   Continuous Infusions: . cefTRIAXone (ROCEPHIN)  IV 2 g (02/27/20 2226)  . lactated ringers 75 mL/hr at 02/28/20 0934  . metronidazole 500 mg (02/28/20 0845)   PRN Meds:.HYDROcodone-acetaminophen, lidocaine, [DISCONTINUED] ondansetron **OR** ondansetron (ZOFRAN) IV  Antibiotics  :   Anti-infectives (From admission, onward)   Start     Dose/Rate Route Frequency Ordered Stop   02/25/20 2000  cefTRIAXone (ROCEPHIN) 2  g in sodium chloride 0.9 % 100 mL IVPB     Discontinue     2 g 200 mL/hr over 30 Minutes Intravenous Every 24 hours 02/24/20 1847     02/25/20 1515  metroNIDAZOLE (FLAGYL) IVPB 500 mg     Discontinue     500 mg 100 mL/hr over 60 Minutes Intravenous Every 8 hours 02/25/20 1455     02/24/20 1830  cefTRIAXone (ROCEPHIN) 2 g in sodium chloride 0.9  % 100 mL IVPB  Status:  Discontinued        2 g 200 mL/hr over 30 Minutes Intravenous Every 24 hours 02/24/20 1741 02/24/20 1847   02/24/20 1200  piperacillin-tazobactam (ZOSYN) IVPB 3.375 g  Status:  Discontinued        3.375 g 100 mL/hr over 30 Minutes Intravenous Every 6 hours 02/24/20 0934 02/24/20 1009   02/24/20 1200  piperacillin-tazobactam (ZOSYN) IVPB 3.375 g  Status:  Discontinued        3.375 g 12.5 mL/hr over 240 Minutes Intravenous Every 8 hours 02/24/20 1009 02/24/20 1741   02/24/20 0245  piperacillin-tazobactam (ZOSYN) IVPB 3.375 g        3.375 g 100 mL/hr over 30 Minutes Intravenous  Once 02/24/20 0237 02/24/20 0535          Objective:   Vitals:   02/28/20 0030 02/28/20 0441 02/28/20 0750 02/28/20 0754  BP: (!) 119/89 115/73  (!) 127/86  Pulse: 75 74 70 76  Resp: 21 20 19 20   Temp: 98.3 F (36.8 C) 98.1 F (36.7 C)  98.2 F (36.8 C)  TempSrc: Oral Oral  Oral  SpO2: 95% 95% 98% 98%  Weight:      Height:        SpO2: 98 % O2 Flow Rate (L/min): 6 L/min  Wt Readings from Last 3 Encounters:  02/27/20 (!) 97 kg  12/24/19 88.2 kg  11/28/19 96.4 kg     Intake/Output Summary (Last 24 hours) at 02/28/2020 0955 Last data filed at 02/28/2020 0847 Gross per 24 hour  Intake 1201.97 ml  Output 1440 ml  Net -238.03 ml     Physical Exam  Awake Alert, No new F.N deficits, Normal affect Golden.AT,PERRAL Supple Neck,No JVD, No cervical lymphadenopathy appriciated.  Symmetrical Chest wall movement, Good air movement bilaterally, CTAB RRR,No Gallops, Rubs or new Murmurs, No Parasternal Heave +ve B.Sounds, moderate ascites with continued left lower quadrant abdominal pain and tenderness No Cyanosis, Clubbing or edema, No new Rash or bruise      Data Review:    Recent Labs  Lab 02/23/20 1531 02/25/20 1158 02/26/20 0928 02/27/20 0505 02/28/20 0401  WBC 16.0* 13.0* 13.6* 10.7* 12.2*  HGB 14.2 13.8 14.4 14.6 14.9  HCT 39.7 38.3* 40.9 42.1 43.4  PLT 249  226 159 299 311  MCV 93.2 90.3 92.1 93.8 93.9  MCH 33.3 32.5 32.4 32.5 32.3  MCHC 35.8 36.0 35.2 34.7 34.3  RDW 16.2* 16.3* 16.8* 17.2* 17.1*  LYMPHSABS  --   --  1.4 2.3 2.7  MONOABS  --   --  0.6 0.9 1.1*  EOSABS  --   --  0.0 0.0 0.1  BASOSABS  --   --  0.1 0.0 0.0    Recent Labs  Lab 02/23/20 1531 02/24/20 0927 02/25/20 1158 02/26/20 0928 02/27/20 0505 02/28/20 0401  NA 123*  --  131* 133* 135 136  K 3.9  --  3.5 4.0 3.8 3.7  CL 90*  --  100 104 104  102  CO2 20*  --  19* 19* 22 25  GLUCOSE 139*  --  247* 210* 125* 108*  BUN 14  --  29* 21* 15 15  CREATININE 1.10  --  1.16 0.97 0.85 1.01  CALCIUM 8.3*  --  7.7* 7.9* 7.9* 7.9*  AST 93*  --  48* 50* 78* 62*  ALT 50*  --  39 38 47* 44  ALKPHOS 241*  --  177* 150* 122 110  BILITOT 19.3*  --  9.5* 5.5* 4.1* 3.3*  ALBUMIN 2.5*  --  1.9* 2.3* 2.0* 2.0*  MG  --   --  2.5* 2.4 2.0 1.6*  CRP  --   --  18.0* 8.6* 4.4* 3.6*  PROCALCITON  --   --  7.71 4.72 2.52 2.52  INR  --  1.3* 1.3* 1.2 1.3* 1.2  BNP  --   --  380.8* 376.9* 645.6* 133.2*    Recent Labs  Lab 02/24/20 0604 02/25/20 1158 02/26/20 0928 02/27/20 0505 02/28/20 0401  CRP  --  18.0* 8.6* 4.4* 3.6*  BNP  --  380.8* 376.9* 645.6* 133.2*  PROCALCITON  --  7.71 4.72 2.52 2.52  SARSCOV2NAA NEGATIVE  --   --   --   --     ------------------------------------------------------------------------------------------------------------------ No results for input(s): CHOL, HDL, LDLCALC, TRIG, CHOLHDL, LDLDIRECT in the last 72 hours.  Lab Results  Component Value Date   HGBA1C 6.0 (H) 04/10/2019   ------------------------------------------------------------------------------------------------------------------ No results for input(s): TSH, T4TOTAL, T3FREE, THYROIDAB in the last 72 hours.  Invalid input(s): FREET3 ------------------------------------------------------------------------------------------------------------------ No results for input(s): VITAMINB12,  FOLATE, FERRITIN, TIBC, IRON, RETICCTPCT in the last 72 hours.  Coagulation profile Recent Labs  Lab 02/24/20 0927 02/25/20 1158 02/26/20 0928 02/27/20 0505 02/28/20 0401  INR 1.3* 1.3* 1.2 1.3* 1.2    No results for input(s): DDIMER in the last 72 hours.  Cardiac Enzymes No results for input(s): CKMB, TROPONINI, MYOGLOBIN in the last 168 hours.  Invalid input(s): CK ------------------------------------------------------------------------------------------------------------------    Component Value Date/Time   BNP 133.2 (H) 02/28/2020 0401    Micro Results Recent Results (from the past 240 hour(s))  Blood culture (routine x 2)     Status: Abnormal   Collection Time: 02/24/20  2:47 AM   Specimen: BLOOD  Result Value Ref Range Status   Specimen Description BLOOD LEFT ANTECUBITAL  Final   Special Requests   Final    BOTTLES DRAWN AEROBIC AND ANAEROBIC Blood Culture adequate volume   Culture  Setup Time   Final    GRAM NEGATIVE RODS IN BOTH AEROBIC AND ANAEROBIC BOTTLES CRITICAL RESULT CALLED TO, READ BACK BY AND VERIFIED WITH: PHARMD MIHN P. [phone] FCP Performed at [facility], [place] [address], [place], [place] [address]    Culture (A)  Final    ESCHERICHIA COLI KLEBSIELLA PNEUMONIAE PROTEUS PENNERI    Report Status 02/27/2020 FINAL  Final   Organism ID, Bacteria ESCHERICHIA COLI  Final   Organism ID, Bacteria KLEBSIELLA PNEUMONIAE  Final   Organism ID, Bacteria PROTEUS PENNERI  Final      Susceptibility   Escherichia coli - MIC*    AMPICILLIN <=2 SENSITIVE Sensitive     CEFAZOLIN <=4 SENSITIVE Sensitive     CEFEPIME <=0.12 SENSITIVE Sensitive     CEFTAZIDIME <=1 SENSITIVE Sensitive     CEFTRIAXONE <=0.25 SENSITIVE Sensitive     CIPROFLOXACIN <=0.25 SENSITIVE Sensitive     GENTAMICIN <=1 SENSITIVE Sensitive  IMIPENEM <=0.25 SENSITIVE Sensitive     TRIMETH/SULFA <=20 SENSITIVE Sensitive     AMPICILLIN/SULBACTAM <=2 SENSITIVE Sensitive      PIP/TAZO <=4 SENSITIVE Sensitive     * ESCHERICHIA COLI   Klebsiella pneumoniae - MIC*    AMPICILLIN >=32 RESISTANT Resistant     CEFAZOLIN <=4 SENSITIVE Sensitive     CEFEPIME <=0.12 SENSITIVE Sensitive     CEFTAZIDIME <=1 SENSITIVE Sensitive     CEFTRIAXONE <=0.25 SENSITIVE Sensitive     CIPROFLOXACIN <=0.25 SENSITIVE Sensitive     GENTAMICIN <=1 SENSITIVE Sensitive     IMIPENEM <=0.25 SENSITIVE Sensitive     TRIMETH/SULFA <=20 SENSITIVE Sensitive     AMPICILLIN/SULBACTAM 4 SENSITIVE Sensitive     PIP/TAZO <=4 SENSITIVE Sensitive     * KLEBSIELLA PNEUMONIAE   Proteus penneri - MIC*    AMPICILLIN >=32 RESISTANT Resistant     CEFAZOLIN >=64 RESISTANT Resistant     CEFEPIME <=0.12 SENSITIVE Sensitive     CEFTAZIDIME <=1 SENSITIVE Sensitive     CEFTRIAXONE <=0.25 SENSITIVE Sensitive     CIPROFLOXACIN <=0.25 SENSITIVE Sensitive     GENTAMICIN <=1 SENSITIVE Sensitive     IMIPENEM 2 SENSITIVE Sensitive     TRIMETH/SULFA <=20 SENSITIVE Sensitive     AMPICILLIN/SULBACTAM 16 INTERMEDIATE Intermediate     PIP/TAZO <=4 SENSITIVE Sensitive     * PROTEUS PENNERI  Blood Culture ID Panel (Reflexed)     Status: Abnormal   Collection Time: 02/24/20  2:47 AM  Result Value Ref Range Status   Enterococcus species NOT DETECTED NOT DETECTED Final   Listeria monocytogenes NOT DETECTED NOT DETECTED Final   Staphylococcus species NOT DETECTED NOT DETECTED Final   Staphylococcus aureus (BCID) NOT DETECTED NOT DETECTED Final   Streptococcus species NOT DETECTED NOT DETECTED Final   Streptococcus agalactiae NOT DETECTED NOT DETECTED Final   Streptococcus pneumoniae NOT DETECTED NOT DETECTED Final   Streptococcus pyogenes NOT DETECTED NOT DETECTED Final   Acinetobacter baumannii NOT DETECTED NOT DETECTED Final   Enterobacteriaceae species DETECTED (A) NOT DETECTED Final    Comment: CRITICAL RESULT CALLED TO, READ BACK BY AND VERIFIED WITH: PHARMD MIHN P. [phone] FCP    Enterobacter cloacae  complex NOT DETECTED NOT DETECTED Final   Escherichia coli DETECTED (A) NOT DETECTED Final    Comment: CRITICAL RESULT CALLED TO, READ BACK BY AND VERIFIED WITH: PHARMD MIHN P. [phone] FCP    Klebsiella oxytoca NOT DETECTED NOT DETECTED Final   Klebsiella pneumoniae DETECTED (A) NOT DETECTED Final    Comment: CRITICAL RESULT CALLED TO, READ BACK BY AND VERIFIED WITH: PHARMD MIHN P. [phone] FCP    Proteus species DETECTED (A) NOT DETECTED Final    Comment: CRITICAL RESULT CALLED TO, READ BACK BY AND VERIFIED WITH: PHARMD MIHN P. [phone] FCP    Serratia marcescens NOT DETECTED NOT DETECTED Final   Carbapenem resistance NOT DETECTED NOT DETECTED Final   Haemophilus influenzae NOT DETECTED NOT DETECTED Final   Neisseria meningitidis NOT DETECTED NOT DETECTED Final   Pseudomonas aeruginosa NOT DETECTED NOT DETECTED Final   Candida albicans NOT DETECTED NOT DETECTED Final   Candida glabrata NOT DETECTED NOT DETECTED Final   Candida krusei NOT DETECTED NOT DETECTED Final   Candida parapsilosis NOT DETECTED NOT DETECTED Final   Candida tropicalis NOT DETECTED NOT DETECTED Final    Comment: Performed at [facility], [place]. [address], [place], [place] [address]  Blood culture (routine x 2)  Status: Abnormal   Collection Time: 02/24/20  3:56 AM   Specimen: BLOOD LEFT HAND  Result Value Ref Range Status   Specimen Description BLOOD LEFT HAND  Final   Special Requests   Final    BOTTLES DRAWN AEROBIC ONLY Blood Culture results may not be optimal due to an inadequate volume of blood received in culture bottles   Culture  Setup Time   Final    AEROBIC BOTTLE ONLY GRAM NEGATIVE RODS CRITICAL VALUE NOTED.  VALUE IS CONSISTENT WITH PREVIOUSLY REPORTED AND CALLED VALUE.    Culture (A)  Final    PROTEUS PENNERI SUSCEPTIBILITIES PERFORMED ON PREVIOUS CULTURE WITHIN THE LAST 5 DAYS. Performed at [facility], [place] [address] [place], [place] [address]    Report Status  02/27/2020 FINAL  Final  SARS Coronavirus 2 by RT PCR (hospital order, performed in [facility] hospital lab) Nasopharyngeal Nasopharyngeal Swab     Status: None   Collection Time: 02/24/20  6:04 AM   Specimen: Nasopharyngeal Swab  Result Value Ref Range Status   SARS Coronavirus 2 NEGATIVE NEGATIVE Final    Comment: (NOTE) SARS-CoV-2 target nucleic acids are NOT DETECTED.  The SARS-CoV-2 RNA is generally detectable in upper and lower respiratory specimens during the acute phase of infection. The lowest concentration of SARS-CoV-2 viral copies this assay can detect is 250 copies / mL. A negative result does not preclude SARS-CoV-2 infection and should not be used as the sole basis for treatment or other patient management decisions.  A negative result may occur with improper specimen collection / handling, submission of specimen other than nasopharyngeal swab, presence of viral mutation(s) within the areas targeted by this assay, and inadequate number of viral copies (<250 copies / mL). A negative result must be combined with clinical observations, patient history, and epidemiological information.  Fact Sheet for Patients:   StrictlyIdeas.no  Fact Sheet for Healthcare Providers: BankingDealers.co.za  This test is not yet approved or  cleared by the [place] FDA and has been authorized for detection and/or diagnosis of SARS-CoV-2 by FDA under an Emergency Use Authorization (EUA).  This EUA will remain in effect (meaning this test can be used) for the duration of the COVID-19 declaration under Section 564(b)(1) of the Act, 21 U.S.C. section 360bbb-3(b)(1), unless the authorization is terminated or revoked sooner.  Performed at [facility], [place] [address] [place], [place] [address]   C Difficile Quick Screen w PCR reflex     Status: None   Collection Time: 02/25/20  4:21 AM   Specimen: STOOL  Result Value Ref Range  Status   C Diff antigen NEGATIVE NEGATIVE Final   C Diff toxin NEGATIVE NEGATIVE Final   C Diff interpretation No C. difficile detected.  Final    Comment: Performed at [facility], [place] [address] [place], [place] [address]  Gastrointestinal Panel by PCR , Stool     Status: None   Collection Time: 02/25/20  4:21 AM   Specimen: STOOL  Result Value Ref Range Status   Campylobacter species NOT DETECTED NOT DETECTED Final   Plesimonas shigelloides NOT DETECTED NOT DETECTED Final   Salmonella species NOT DETECTED NOT DETECTED Final   Yersinia enterocolitica NOT DETECTED NOT DETECTED Final   Vibrio species NOT DETECTED NOT DETECTED Final   Vibrio cholerae NOT DETECTED NOT DETECTED Final   Enteroaggregative E coli (EAEC) NOT DETECTED NOT DETECTED Final   Enteropathogenic E coli (EPEC) NOT DETECTED NOT DETECTED Final  Enterotoxigenic E coli (ETEC) NOT DETECTED NOT DETECTED Final   Shiga like toxin producing E coli (STEC) NOT DETECTED NOT DETECTED Final   Shigella/Enteroinvasive E coli (EIEC) NOT DETECTED NOT DETECTED Final   Cryptosporidium NOT DETECTED NOT DETECTED Final   Cyclospora cayetanensis NOT DETECTED NOT DETECTED Final   Entamoeba histolytica NOT DETECTED NOT DETECTED Final   Giardia lamblia NOT DETECTED NOT DETECTED Final   Adenovirus F40/41 NOT DETECTED NOT DETECTED Final   Astrovirus NOT DETECTED NOT DETECTED Final   Norovirus GI/GII NOT DETECTED NOT DETECTED Final   Rotavirus A NOT DETECTED NOT DETECTED Final   Sapovirus (I, II, IV, and V) NOT DETECTED NOT DETECTED Final    Comment: Performed at [facility], [place] [place], [place] [address]  MRSA PCR Screening     Status: None   Collection Time: 02/25/20  4:57 AM  Result Value Ref Range Status   MRSA by PCR NEGATIVE NEGATIVE Final    Comment:        The GeneXpert MRSA Assay (FDA approved for NASAL specimens only), is one component of a comprehensive MRSA colonization surveillance program. It  is not intended to diagnose MRSA infection nor to guide or monitor treatment for MRSA infections. Performed at [facility], [place] [address], [place], [place] [address]   Gram stain     Status: None   Collection Time: 02/25/20  3:21 PM   Specimen: Abdomen  Result Value Ref Range Status   Specimen Description ABDOMEN  Final   Special Requests PERITONEAL FLUID  Final   Gram Stain   Final    ABUNDANT WBC PRESENT, PREDOMINANTLY PMN NO ORGANISMS SEEN Performed at [facility], [place] [address], [place], [place] [address]    Report Status 02/25/2020 FINAL  Final  Culture, body fluid-bottle     Status: None (Preliminary result)   Collection Time: 02/25/20  3:21 PM   Specimen: Abdomen  Result Value Ref Range Status   Specimen Description ABDOMEN  Final   Special Requests PERITONEAL FLUID  Final   Culture   Final    NO GROWTH 2 DAYS Performed at [place] [address], [place], [place] [address]    Report Status PENDING  Incomplete    Radiology Reports CT ABDOMEN PELVIS W CONTRAST  Result Date: 02/24/2020 CLINICAL DATA:  Generalized abdominal pain, swelling, and jaundice with vomiting and fever. Leukocytosis. EXAM: CT ABDOMEN AND PELVIS WITH CONTRAST TECHNIQUE: Multidetector CT imaging of the abdomen and pelvis was performed using the standard protocol following bolus administration of intravenous contrast. CONTRAST:  128mL OMNIPAQUE IOHEXOL 300 MG/ML  SOLN COMPARISON:  12/24/2019 FINDINGS: Lower chest: Punctate calcified nodules in the right middle lobe and right lower lobe. No basilar lung consolidation or pleural effusion. Hepatobiliary: There is a new 1.2 cm hypodense lesion inferiorly in the right hepatic lobe (series 3, image 63), and there are additional more subtle subcentimeter hypodensities in the inferior right hepatic lobe which also appear new (for example series 3, images 56, 58, 59, and 62). Status post cholecystectomy. A biliary stent remains in place  without significant dilatation of the common bile duct, however there is increased dilatation of the common hepatic duct about the proximal aspect of the stent measuring 2.1 cm in diameter. There is at most minimal central intrahepatic biliary dilatation. Pancreas: Unremarkable. Spleen: No focal lesion. Unchanged cleft along the posterior splenic margin. Adrenals/Urinary Tract: Unremarkable adrenal glands. No evidence of renal mass, calculi, or  hydronephrosis. Unremarkable bladder. Stomach/Bowel: The stomach is unremarkable. There is no evidence of bowel obstruction. Mildly prominent gaseous distension of the appendix without evidence of appendicitis. Vascular/Lymphatic: No significant vascular findings are present. No enlarged abdominal or pelvic lymph nodes. Reproductive: Mildly enlarged prostate. Other: Increased small to moderate volume ascites. No loculated fluid collection. No pneumoperitoneum. Small umbilical hernia containing fat and fluid. Musculoskeletal: No acute osseous abnormality or suspicious osseous lesion. IMPRESSION: 1. New small ill-defined hypodensities inferiorly in the right hepatic lobe, nonspecific though infection/small abscesses are a concern with this history. 2. Increased small to moderate volume ascites. 3. Unchanged biliary stent with increased dilatation of the common hepatic duct. 4. Aortic Atherosclerosis (ICD10-I70.0). Electronically Signed   By: Logan Bores M.D.   On: 02/24/2020 05:42   DG Chest Port 1 View  Result Date: 02/27/2020 CLINICAL DATA:  Shortness of breath.  Sepsis. EXAM: PORTABLE CHEST 1 VIEW COMPARISON:  12/03/2019 FINDINGS: Lordotic technique is demonstrated. Lungs are adequately inflated without focal airspace consolidation or effusion. Borderline stable cardiomegaly. Remainder of the exam is unchanged. IMPRESSION: No acute cardiopulmonary disease. Electronically Signed   By: Marin Olp M.D.   On: 02/27/2020 09:13   DG ERCP BILIARY & PANCREATIC  DUCTS  Result Date: 02/24/2020 CLINICAL DATA:  ERCP. EXAM: ERCP TECHNIQUE: Multiple spot images obtained with the fluoroscopic device and submitted for interpretation post-procedure. FLUOROSCOPY TIME:  Fluoroscopy Time:  2 minutes and 13 seconds Radiation Exposure Index (if provided by the fluoroscopic device): 66.82 mGy Number of Acquired Spot Images: 6 COMPARISON:  CT dated February 24, 2020.  11/29/2019 FINDINGS: And insult spot images demonstrate a common bile duct stent. The stent has been removed. Injection of contrast followed by balloon sweep demonstrates a persistent narrowing of the mid and distal common bile duct. There appears to be some mild intrahepatic biliary ductal dilatation. The patient is status post prior cholecystectomy. Final images demonstrate replacement of the plastic biliary stent. IMPRESSION: Status post ERCP as detailed above. These images were submitted for radiologic interpretation only. Please see the procedural report for the amount of contrast and the fluoroscopy time utilized. Electronically Signed   By: Constance Holster M.D.   On: 02/24/2020 18:49   US Abdomen Limited RUQ  Result Date: 02/24/2020 CLINICAL DATA:  Jaundice EXAM: ULTRASOUND ABDOMEN LIMITED RIGHT UPPER QUADRANT COMPARISON:  12/24/2019 FINDINGS: Gallbladder: Surgically absent Common bile duct: Diameter: 8 mm Liver: Nodularity of the liver capsule compatible with cirrhosis. No focal parenchymal abnormality. No intrahepatic duct dilation. Portal vein is patent on color Doppler imaging with normal direction of blood flow towards the liver. Other: There is a small amount of ascites throughout the abdomen, greatest in the lower quadrants. IMPRESSION: 1. Small volume ascites. 2. Findings consistent with hepatic cirrhosis. 3. Cholecystectomy. Electronically Signed   By: Randa Ngo M.D.   On: 02/24/2020 03:23   IR Paracentesis  Result Date: 02/25/2020 INDICATION: Patient with a history of alcoholic hepatitis and  ascites presents today for a therapeutic and diagnostic paracentesis. EXAM: ULTRASOUND GUIDED PARACENTESIS MEDICATIONS: 1% lidocaine 10 mL COMPLICATIONS: None immediate PROCEDURE: Informed written consent was obtained from the patient after a discussion of the risks, benefits and alternatives to treatment. A timeout was performed prior to the initiation of the procedure. Initial ultrasound scanning demonstrates a large amount of ascites within the left lower abdominal quadrant. The left lower abdomen was prepped and draped in the usual sterile fashion. 1% lidocaine was used for local anesthesia. Following this, a 19 gauge, 7-cm, Teressa Lower  catheter was introduced. An ultrasound image was saved for documentation purposes. The paracentesis was performed. The catheter was removed and a dressing was applied. The patient tolerated the procedure well without immediate post procedural complication. FINDINGS: A total of approximately 2.2 L of dark yellow fluid was removed. Samples were sent to the laboratory as requested by the clinical team. IMPRESSION: Successful ultrasound-guided paracentesis yielding 2.2 liters of peritoneal fluid. Read by: Soyla Dryer, NP Electronically Signed   By: Lucrezia Europe M.D.   On: 02/25/2020 14:28    Time Spent in minutes  30   Lala Lund M.D on 02/28/2020 at 9:55 AM  To page go to [email or web address] - password [email or web address]

## 2020-02-28 NOTE — Progress Notes (Signed)
[facility] Progress Note  Subjective: Patient seen.  Chart reviewed.  He has cirrhosis of the liver, ascites, recent cholecystectomy with mildly, biliary stent placement.  Recent cytology from that showed atypical cells.  Per chart review he may need outpatient EUS along with ERCP at a later date.  He was recently started on Lasix 20 mg daily and spironolactone 25 mg daily.  Today he complains of left lower quadrant abdominal pain.  Recent CT scan a few days ago did not show any evidence of diverticulitis but he is tender in the left lower quadrant.  Objective: Vital signs in last 24 hours: Temp:  [98.1 F (36.7 C)-98.4 F (36.9 C)] 98.2 F (36.8 C) (07/26 0754) Pulse Rate:  [65-76] 76 (07/26 0754) Resp:  [18-23] 20 (07/26 0754) BP: (115-132)/(73-89) 127/86 (07/26 0754) SpO2:  [93 %-98 %] 98 % (07/26 0754) Weight change:    PE:  No distress  Heart regular rhythm  Lungs clear  Abdomen distended with ascites, left lower quadrant pain to palpation  Lab Results: Results for orders placed or performed during the hospital encounter of 02/23/20 (from the past 24 hour(s))  Magnesium     Status: Abnormal   Collection Time: 02/28/20  4:01 AM  Result Value Ref Range   Magnesium 1.6 (L) 1.7 - 2.4 mg/dL  C-reactive protein     Status: Abnormal   Collection Time: 02/28/20  4:01 AM  Result Value Ref Range   CRP 3.6 (H) <1.0 mg/dL  Comprehensive metabolic panel     Status: Abnormal   Collection Time: 02/28/20  4:01 AM  Result Value Ref Range   Sodium 136 135 - 145 mmol/L   Potassium 3.7 3.5 - 5.1 mmol/L   Chloride 102 98 - 111 mmol/L   CO2 25 22 - 32 mmol/L   Glucose, Bld 108 (H) 70 - 99 mg/dL   BUN 15 6 - 20 mg/dL   Creatinine, Ser 1.01 0.61 - 1.24 mg/dL   Calcium 7.9 (L) 8.9 - 10.3 mg/dL   Total Protein 5.5 (L) 6.5 - 8.1 g/dL   Albumin 2.0 (L) 3.5 - 5.0 g/dL   AST 62 (H) 15 - 41 U/L   ALT 44 0 - 44 U/L   Alkaline Phosphatase 110 38 - 126 U/L   Total Bilirubin 3.3  (H) 0.3 - 1.2 mg/dL   GFR calc non Af Amer >60 >60 mL/min   GFR calc Af Amer >60 >60 mL/min   Anion gap 9 5 - 15  CBC with Differential/Platelet     Status: Abnormal   Collection Time: 02/28/20  4:01 AM  Result Value Ref Range   WBC 12.2 (H) 4.0 - 10.5 K/uL   RBC 4.62 4.22 - 5.81 MIL/uL   Hemoglobin 14.9 13.0 - 17.0 g/dL   HCT 43.4 39 - 52 %   MCV 93.9 80.0 - 100.0 fL   MCH 32.3 26.0 - 34.0 pg   MCHC 34.3 30.0 - 36.0 g/dL   RDW 17.1 (H) 11.5 - 15.5 %   Platelets 311 150 - 400 K/uL   nRBC 0.0 0.0 - 0.2 %   Neutrophils Relative % 67 %   Neutro Abs 8.0 (H) 1.7 - 7.7 K/uL   Lymphocytes Relative 22 %   Lymphs Abs 2.7 0.7 - 4.0 K/uL   Monocytes Relative 9 %   Monocytes Absolute 1.1 (H) 0 - 1 K/uL   Eosinophils Relative 1 %   Eosinophils Absolute 0.1 0 - 0 K/uL   Basophils  Relative 0 %   Basophils Absolute 0.0 0 - 0 K/uL   Immature Granulocytes 1 %   Abs Immature Granulocytes 0.17 (H) 0.00 - 0.07 K/uL  Brain natriuretic peptide     Status: Abnormal   Collection Time: 02/28/20  4:01 AM  Result Value Ref Range   B Natriuretic Peptide 133.2 (H) 0.0 - 100.0 pg/mL  Procalcitonin     Status: None   Collection Time: 02/28/20  4:01 AM  Result Value Ref Range   Procalcitonin 2.52 ng/mL  Protime-INR     Status: None   Collection Time: 02/28/20  4:01 AM  Result Value Ref Range   Prothrombin Time 14.9 11.4 - 15.2 seconds   INR 1.2 0.8 - 1.2    Studies/Results: No results found.    Assessment: Multiple medical problems as mentioned above.  New problem of left lower quadrant abdominal pain.  Discussed this with Dr. Candiss Norse and he is going to order a CT abdomen and pelvis to further investigate.  Plan:   Continue supportive care.  CT abdomen pelvis.    SAM F Caleen Taaffe 02/28/2020, 9:15 AM  Pager: [phone] If no answer or after 5 PM call [phone]

## 2020-02-28 NOTE — Procedures (Signed)
PROCEDURE SUMMARY:  Successful US guided paracentesis from LLQ.  Yielded 1.9 L of clear yellow fluid.  No immediate complications.  Pt tolerated well.   Specimen was not sent for labs.  EBL < 84mL  Ascencion Dike PA-C 02/28/2020 4:10 PM

## 2020-02-28 NOTE — Progress Notes (Signed)
Patient off unit, transported to IR then CT.

## 2020-02-29 LAB — CBC WITH DIFFERENTIAL/PLATELET
Abs Immature Granulocytes: 0.21 10*3/uL — ABNORMAL HIGH (ref 0.00–0.07)
Basophils Absolute: 0.1 10*3/uL (ref 0.0–0.1)
Basophils Relative: 1 %
Eosinophils Absolute: 0.1 10*3/uL (ref 0.0–0.5)
Eosinophils Relative: 0 %
HCT: 41.2 % (ref 39.0–52.0)
Hemoglobin: 14.2 g/dL (ref 13.0–17.0)
Immature Granulocytes: 1 %
Lymphocytes Relative: 12 %
Lymphs Abs: 1.8 10*3/uL (ref 0.7–4.0)
MCH: 32.3 pg (ref 26.0–34.0)
MCHC: 34.5 g/dL (ref 30.0–36.0)
MCV: 93.8 fL (ref 80.0–100.0)
Monocytes Absolute: 1.1 10*3/uL — ABNORMAL HIGH (ref 0.1–1.0)
Monocytes Relative: 7 %
Neutro Abs: 11.7 10*3/uL — ABNORMAL HIGH (ref 1.7–7.7)
Neutrophils Relative %: 79 %
Platelets: 302 10*3/uL (ref 150–400)
RBC: 4.39 MIL/uL (ref 4.22–5.81)
RDW: 16.6 % — ABNORMAL HIGH (ref 11.5–15.5)
WBC: 14.9 10*3/uL — ABNORMAL HIGH (ref 4.0–10.5)
nRBC: 0 % (ref 0.0–0.2)

## 2020-02-29 LAB — COMPREHENSIVE METABOLIC PANEL
ALT: 45 U/L — ABNORMAL HIGH (ref 0–44)
AST: 70 U/L — ABNORMAL HIGH (ref 15–41)
Albumin: 2 g/dL — ABNORMAL LOW (ref 3.5–5.0)
Alkaline Phosphatase: 104 U/L (ref 38–126)
Anion gap: 7 (ref 5–15)
BUN: 9 mg/dL (ref 6–20)
CO2: 25 mmol/L (ref 22–32)
Calcium: 7.8 mg/dL — ABNORMAL LOW (ref 8.9–10.3)
Chloride: 100 mmol/L (ref 98–111)
Creatinine, Ser: 0.9 mg/dL (ref 0.61–1.24)
GFR calc Af Amer: >60 mL/min (ref >60)
GFR calc non Af Amer: >60 mL/min (ref >60)
Glucose, Bld: 103 mg/dL — ABNORMAL HIGH (ref 70–99)
Potassium: 3.8 mmol/L (ref 3.5–5.1)
Sodium: 132 mmol/L — ABNORMAL LOW (ref 135–145)
Total Bilirubin: 3.1 mg/dL — ABNORMAL HIGH (ref 0.3–1.2)
Total Protein: 5.5 g/dL — ABNORMAL LOW (ref 6.5–8.1)

## 2020-02-29 LAB — BRAIN NATRIURETIC PEPTIDE: B Natriuretic Peptide: 62.9 pg/mL (ref 0.0–100.0)

## 2020-02-29 LAB — C-REACTIVE PROTEIN: CRP: 3.3 mg/dL — ABNORMAL HIGH (ref <1.0)

## 2020-02-29 LAB — MAGNESIUM: Magnesium: 1.8 mg/dL (ref 1.7–2.4)

## 2020-02-29 LAB — PROCALCITONIN: Procalcitonin: 1.01 ng/mL

## 2020-02-29 LAB — PROTIME-INR
INR: 1.3 — ABNORMAL HIGH (ref 0.8–1.2)
Prothrombin Time: 15.6 seconds — ABNORMAL HIGH (ref 11.4–15.2)

## 2020-02-29 LAB — LIPASE, BLOOD: Lipase: 58 U/L — ABNORMAL HIGH (ref 11–51)

## 2020-02-29 MED ORDER — SODIUM CHLORIDE 0.9 % IV SOLN
INTRAVENOUS | Status: DC | PRN
  Administered 2020-02-29: 10 mL via INTRAVENOUS
  Administered 2020-03-03: 250 mL via INTRAVENOUS

## 2020-02-29 NOTE — Progress Notes (Signed)
Physical Therapy Treatment Patient Details Name: Micheal Watson MRN: [id number] DOB: 06-07-1961 Today's Date: 02/29/2020    History of Present Illness Micheal Watson is a 59 y.o. male with medical history significant of ETOH dependence; recent acute cholecystitis/cholangitis s/p lap chole w/CBD stent and JP drain placement 4-12/2019 (removed since).  He is now presenting with recurrent LLQ abdomnial pain.  Now s/p ERCP 7/22 & paracentesis 7/23.    PT Comments    Pt was generally steady, but tentative and slow to ramp up gait speed.  Emphasis on gait stability, quality, stamina, safety on the stairs and basic mobility safety.    Follow Up Recommendations  No PT follow up     Equipment Recommendations  None recommended by PT    Recommendations for Other Services       Precautions / Restrictions Precautions Precautions: Fall    Mobility  Bed Mobility               General bed mobility comments: up in the chair on arrival  Transfers Overall transfer level: Needs assistance Equipment used: None Transfers: Sit to/from Stand Sit to Stand: Supervision            Ambulation/Gait Ambulation/Gait assistance: Supervision Gait Distance (Feet): 400 Feet Assistive device: None Gait Pattern/deviations: Step-through pattern;Decreased step length - right;Decreased step length - left;Decreased stride length Gait velocity: moderate.  Did not increase speed to cuing to age approp levels. Gait velocity interpretation: 1.31 - 2.62 ft/sec, indicative of limited community ambulator General Gait Details: generally steady increasing or changing speed, abrupt directional changes, walking backward, scanning 360* and stepping over low obstacle.  No overt deviation   Stairs Stairs: Yes Stairs assistance: Supervision Stair Management: No rails;One rail Right;Alternating pattern;Backwards;Forwards Number of Stairs: 12 General stair comments: safest using the  rails   Wheelchair Mobility    Modified Rankin (Stroke Patients Only)       Balance Overall balance assessment: Needs assistance Sitting-balance support: No upper extremity supported Sitting balance-Leahy Scale: Good       Standing balance-Leahy Scale: Good                              Cognition Arousal/Alertness: Awake/alert Behavior During Therapy: WFL for tasks assessed/performed Overall Cognitive Status: Within Functional Limits for tasks assessed                                        Exercises      General Comments General comments (skin integrity, edema, etc.): vss  HR rising into the upper 90's with activity, sats generally in the lower 90's, sats occasionally dropped into the upper 80's, but waveform was suspect.      Pertinent Vitals/Pain Pain Assessment: Faces Faces Pain Scale: Hurts little more Pain Location: abdomen both sides and L anterior thigh Pain Descriptors / Indicators: Discomfort Pain Intervention(s): Monitored during session    Home Living                      Prior Function            PT Goals (current goals can now be found in the care plan section) Acute Rehab PT Goals Patient Stated Goal: return to normal PT Goal Formulation: With patient Time For Goal Achievement: 03/10/20 Potential to Achieve Goals: Good Progress towards PT goals: Progressing toward  goals    Frequency    Min 3X/week      PT Plan Current plan remains appropriate    Co-evaluation              AM-PAC PT "6 Clicks" Mobility   Outcome Measure  Help needed turning from your back to your side while in a flat bed without using bedrails?: None Help needed moving from lying on your back to sitting on the side of a flat bed without using bedrails?: None Help needed moving to and from a bed to a chair (including a wheelchair)?: None Help needed standing up from a chair using your arms (e.g., wheelchair or bedside  chair)?: None Help needed to walk in hospital room?: None Help needed climbing 3-5 steps with a railing? : None 6 Click Score: 24    End of Session   Activity Tolerance: Patient tolerated treatment well Patient left: with call bell/phone within reach;in chair Nurse Communication: Mobility status PT Visit Diagnosis: Other abnormalities of gait and mobility (R26.89)     Time: [id number] PT Time Calculation (min) (ACUTE ONLY): 21 min  Charges:  $Gait Training: 8-22 mins                     02/29/2020  Ginger Carne., PT Acute Rehabilitation Services [phone]  (pager) [phone]  (office)   [name] 02/29/2020, 12:01 PM

## 2020-02-29 NOTE — Progress Notes (Signed)
PROGRESS NOTE                                                                                                                                                                                                             Patient Demographics:    Micheal Watson, is a 59 y.o. male, DOB - Dec 07, 1960, WUJ:[id number]  Admit date - 02/23/2020   Admitting Physician Karmen Bongo, MD  Outpatient Primary MD for the patient is Patient, No Pcp Per  LOS - 5  Chief Complaint  Patient presents with  . Abdominal Pain  . Jaundice       Brief Narrative Micheal Watson is a 59 y.o. male with medical history significant of ETOH dependence; recent acute cholecystitis/cholangitis s/p CBD stent and JP drain placement (removed since).  He is now presenting with recurrent LLQ abdomnial pain.   Subjective:   Patient in bed, appears comfortable, denies any headache, no fever, no chest pain or pressure, no shortness of breath , improved abdominal pain. No focal weakness.    Assessment  & Plan :      1.   Sepsis from SBP following intra-abdominal surgery with current hyperbilirubinemia and obstructed CBD stent along with small Hepatic abscess - in a patient with following history  -Hx acute cholecystitis with hydrops; laparoscopic cholecystectomy with postoperative bile leak 11/22/2019 Dr. 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 Clarene Essex -Diagnostic laparoscopy, evacuation of bilious ascites(3L),JP drain placement 12/07/2019 Dr. Donne Hazel -Drain removal 12/23/2019 - ERCP by Dr. Paulita Fujita on 02/24/2020 with CBD biliary stent stricture, stent removal and new stent placed.  Biopsy sent. -Ultrasound-guided paracentesis on 02/24/2021 with 2.2 L of fluid removed - SBP -Repeat therapeutic ultrasound ordered on 02/28/2020 -Repeat CT scan of abdomen pelvis ordered on 02/28/2020 -shows liver abscesses with largest one being 1.1 cm. -Repeat ultrasound-guided  paracentesis on 02/28/2020 with 1.9 L of fluid removed.   Sepsis due to polymicrobial gram-negative bacteremia caused by SBP/ ? Hepatic abscesses, initial BCID positive for gram-negative rods with possible E. coli, Klebsiella and Proteus growing.  Abdominal fluid cultures consistent with SBP.  Both GI and general surgery have been consulted.  He is currently on Rocephin and Flagyl and sepsis pathophysiology is improving.  He still having some right upper quadrant discomfort and repeat CT scan suggests a 1.1 cm liver abscess with multiple small foci, will require general surgery to address may require CT-guided drainage.  Per GI has been started on diuretics for his recurrent ascites.  So far he has had 2 ultrasound-guided paracentesis with total 4 L of fluid removed and consistent with SBP.   Recent Labs  Lab 02/23/20 1531 02/24/20 0407 02/24/20 0622 02/25/20 1158 02/26/20 0928 02/27/20 0505 02/28/20 0401 02/29/20 0344  WBC   < >  --   --  13.0* 13.6* 10.7* 12.2* 14.9*  PLT   < >  --   --  226 159 299 311 302  CRP  --   --   --  18.0* 8.6* 4.4* 3.6* 3.3*  PROCALCITON  --   --   --  7.71 4.72 2.52 2.52 1.01  LATICACIDVEN  --  2.2* 1.9 3.1*  --  2.5*  --   --    < > = values in this interval not displayed.    HTN -blood pressure stable off of medications.  HLD - He was prescribed Lipitor prior but has not been taking it.  ETOH dependence - ongoing abuse, no signs of withdrawal on CIWA protocol.  Says he now drinks only 1-2 beers every weekend.  H/o COVID-19 infection in April 2020  -  Resolved.    Condition - Extremely Guarded  Family Communication  :  Son Baldemar Friday [phone] on 02/25/20 2.58 pm message left  Code Status :  Full  Consults  :  GI, CCS  Procedures  :    CT Repeat - Non acute - 1. Stable low-density lesions in the right lobe of the liver, largest measuring 1.2 cm, suspicious for small abscesses. No significant progression or convalescence. Vague subcentimeter  structure in the central liver was not definitively seen on prior exam, unclear if this represents a new area of infection or better seen currently due to differences in phase of contrast. 2. Moderate volume abdominopelvic ascites. There is no peripheral enhancement or loculation. 3. Cirrhosis. Biliary stent remains in place, no significant intra or extrahepatic biliary ductal dilatation. 4. Colonic diverticulosis without diverticulitis. 5. Small umbilical hernia containing fat and small amount of ascites. Mild skin thickening adjacent to the umbilical hernia in the midline.   Ultrasound-guided paracentesis.  SBP.    CT -  1. New small ill-defined hypodensities inferiorly in the right hepatic lobe, nonspecific though infection/small abscesses are a concern with this history. 2. Increased small to moderate volume ascites. 3. Unchanged biliary stent with increased dilatation of the common hepatic duct. 4. Aortic Atherosclerosis  ERCP - with CBD stricture at the site of her stent, stent removed and new temporary stent placed on 02/24/2020 by Dr. Paulita Fujita.  PUD Prophylaxis : PPI  Disposition Plan  :    Status is: Inpatient  Remains inpatient appropriate because:IV treatments appropriate due to intensity of illness or inability to take PO   Dispo: The patient is from: Home              Anticipated d/c is to: Home              Anticipated d/c date is: 3 days              Patient currently is not medically stable to d/c.   DVT Prophylaxis  : Heparin    Lab Results  Component Value Date   PLT 302 02/29/2020    Diet :  Diet Order            DIET SOFT Room service appropriate? Yes; Fluid consistency: Thin  Diet effective now  Inpatient Medications Scheduled Meds: . folic acid  1 mg Oral Daily  . furosemide  20 mg Oral Daily  . heparin injection (subcutaneous)  5,000 Units Subcutaneous Q8H  . multivitamin with minerals  1 tablet Oral Daily  . pantoprazole  40 mg Oral  Daily  . sodium chloride flush  3 mL Intravenous Q12H  . spironolactone  25 mg Oral Daily  . thiamine  100 mg Oral Daily   Or  . thiamine  100 mg Intravenous Daily   Continuous Infusions: . albumin human    . cefTRIAXone (ROCEPHIN)  IV 2 g (02/28/20 2108)  . metronidazole 500 mg (02/29/20 0755)   PRN Meds:.albumin human, HYDROcodone-acetaminophen, lidocaine, lidocaine, [DISCONTINUED] ondansetron **OR** ondansetron (ZOFRAN) IV  Antibiotics  :   Anti-infectives (From admission, onward)   Start     Dose/Rate Route Frequency Ordered Stop   02/25/20 2000  cefTRIAXone (ROCEPHIN) 2 g in sodium chloride 0.9 % 100 mL IVPB     Discontinue     2 g 200 mL/hr over 30 Minutes Intravenous Every 24 hours 02/24/20 1847     02/25/20 1515  metroNIDAZOLE (FLAGYL) IVPB 500 mg     Discontinue     500 mg 100 mL/hr over 60 Minutes Intravenous Every 8 hours 02/25/20 1455     02/24/20 1830  cefTRIAXone (ROCEPHIN) 2 g in sodium chloride 0.9 % 100 mL IVPB  Status:  Discontinued        2 g 200 mL/hr over 30 Minutes Intravenous Every 24 hours 02/24/20 1741 02/24/20 1847   02/24/20 1200  piperacillin-tazobactam (ZOSYN) IVPB 3.375 g  Status:  Discontinued        3.375 g 100 mL/hr over 30 Minutes Intravenous Every 6 hours 02/24/20 0934 02/24/20 1009   02/24/20 1200  piperacillin-tazobactam (ZOSYN) IVPB 3.375 g  Status:  Discontinued        3.375 g 12.5 mL/hr over 240 Minutes Intravenous Every 8 hours 02/24/20 1009 02/24/20 1741   02/24/20 0245  piperacillin-tazobactam (ZOSYN) IVPB 3.375 g        3.375 g 100 mL/hr over 30 Minutes Intravenous  Once 02/24/20 0237 02/24/20 0535          Objective:   Vitals:   02/28/20 2239 02/28/20 2313 02/29/20 0335 02/29/20 0740  BP:  126/77 118/68 (!) 121/88  Pulse:  92 79 77  Resp:  15 18 18   Temp: 99.5 F (37.5 C) 98.4 F (36.9 C) 98.3 F (36.8 C) 98.1 F (36.7 C)  TempSrc: Oral Oral Oral Oral  SpO2:  95% 96% 93%  Weight:      Height:        SpO2: 93 % O2  Flow Rate (L/min): 6 L/min  Wt Readings from Last 3 Encounters:  02/27/20 (!) 97 kg  12/24/19 88.2 kg  11/28/19 96.4 kg     Intake/Output Summary (Last 24 hours) at 02/29/2020 0930 Last data filed at 02/29/2020 0700 Gross per 24 hour  Intake 1569.07 ml  Output 1950 ml  Net -380.93 ml     Physical Exam  Awake Alert, No new F.N deficits, Normal affect Oliver.AT,PERRAL Supple Neck,No JVD, No cervical lymphadenopathy appriciated.  Symmetrical Chest wall movement, Good air movement bilaterally, CTAB RRR,No Gallops, Rubs or new Murmurs, No Parasternal Heave +ve B.Sounds, moderate ascites , now mild RUQ tenderness No Cyanosis, Clubbing or edema, No new Rash or bruise    Data Review:    Recent Labs  Lab 02/25/20 1158 02/26/20 0928 02/27/20 0505 02/28/20  0401 02/29/20 0344  WBC 13.0* 13.6* 10.7* 12.2* 14.9*  HGB 13.8 14.4 14.6 14.9 14.2  HCT 38.3* 40.9 42.1 43.4 41.2  PLT 226 159 299 311 302  MCV 90.3 92.1 93.8 93.9 93.8  MCH 32.5 32.4 32.5 32.3 32.3  MCHC 36.0 35.2 34.7 34.3 34.5  RDW 16.3* 16.8* 17.2* 17.1* 16.6*  LYMPHSABS  --  1.4 2.3 2.7 1.8  MONOABS  --  0.6 0.9 1.1* 1.1*  EOSABS  --  0.0 0.0 0.1 0.1  BASOSABS  --  0.1 0.0 0.0 0.1    Recent Labs  Lab 02/25/20 1158 02/26/20 0928 02/27/20 0505 02/28/20 0401 02/29/20 0344  NA 131* 133* 135 136 132*  K 3.5 4.0 3.8 3.7 3.8  CL 100 104 104 102 100  CO2 19* 19* 22 25 25   GLUCOSE 247* 210* 125* 108* 103*  BUN 29* 21* 15 15 9   CREATININE 1.16 0.97 0.85 1.01 0.90  CALCIUM 7.7* 7.9* 7.9* 7.9* 7.8*  AST 48* 50* 78* 62* 70*  ALT 39 38 47* 44 45*  ALKPHOS 177* 150* 122 110 104  BILITOT 9.5* 5.5* 4.1* 3.3* 3.1*  ALBUMIN 1.9* 2.3* 2.0* 2.0* 2.0*  MG 2.5* 2.4 2.0 1.6* 1.8  CRP 18.0* 8.6* 4.4* 3.6* 3.3*  PROCALCITON 7.71 4.72 2.52 2.52 1.01  INR 1.3* 1.2 1.3* 1.2 1.3*  BNP 380.8* 376.9* 645.6* 133.2* 62.9    Recent Labs  Lab 02/24/20 0604 02/25/20 1158 02/26/20 0928 02/27/20 0505 02/28/20 0401 02/29/20 0344   CRP  --  18.0* 8.6* 4.4* 3.6* 3.3*  BNP  --  380.8* 376.9* 645.6* 133.2* 62.9  PROCALCITON  --  7.71 4.72 2.52 2.52 1.01  SARSCOV2NAA NEGATIVE  --   --   --   --   --     ------------------------------------------------------------------------------------------------------------------ No results for input(s): CHOL, HDL, LDLCALC, TRIG, CHOLHDL, LDLDIRECT in the last 72 hours.  Lab Results  Component Value Date   HGBA1C 6.0 (H) 04/10/2019   ------------------------------------------------------------------------------------------------------------------ No results for input(s): TSH, T4TOTAL, T3FREE, THYROIDAB in the last 72 hours.  Invalid input(s): FREET3 ------------------------------------------------------------------------------------------------------------------ No results for input(s): VITAMINB12, FOLATE, FERRITIN, TIBC, IRON, RETICCTPCT in the last 72 hours.  Coagulation profile Recent Labs  Lab 02/25/20 1158 02/26/20 0928 02/27/20 0505 02/28/20 0401 02/29/20 0344  INR 1.3* 1.2 1.3* 1.2 1.3*    No results for input(s): DDIMER in the last 72 hours.  Cardiac Enzymes No results for input(s): CKMB, TROPONINI, MYOGLOBIN in the last 168 hours.  Invalid input(s): CK ------------------------------------------------------------------------------------------------------------------    Component Value Date/Time   BNP 62.9 02/29/2020 0344    Micro Results Recent Results (from the past 240 hour(s))  Blood culture (routine x 2)     Status: Abnormal   Collection Time: 02/24/20  2:47 AM   Specimen: BLOOD  Result Value Ref Range Status   Specimen Description BLOOD LEFT ANTECUBITAL  Final   Special Requests   Final    BOTTLES DRAWN AEROBIC AND ANAEROBIC Blood Culture adequate volume   Culture  Setup Time   Final    GRAM NEGATIVE RODS IN BOTH AEROBIC AND ANAEROBIC BOTTLES CRITICAL RESULT CALLED TO, READ BACK BY AND VERIFIED WITH: PHARMD MIHN P. [phone] FCP Performed  at [facility], [place] [address] [place], [place] [address]    Culture (A)  Final    ESCHERICHIA COLI KLEBSIELLA PNEUMONIAE PROTEUS PENNERI    Report Status 02/27/2020 FINAL  Final   Organism ID, Bacteria ESCHERICHIA COLI  Final  Organism ID, Bacteria KLEBSIELLA PNEUMONIAE  Final   Organism ID, Bacteria PROTEUS PENNERI  Final      Susceptibility   Escherichia coli - MIC*    AMPICILLIN <=2 SENSITIVE Sensitive     CEFAZOLIN <=4 SENSITIVE Sensitive     CEFEPIME <=0.12 SENSITIVE Sensitive     CEFTAZIDIME <=1 SENSITIVE Sensitive     CEFTRIAXONE <=0.25 SENSITIVE Sensitive     CIPROFLOXACIN <=0.25 SENSITIVE Sensitive     GENTAMICIN <=1 SENSITIVE Sensitive     IMIPENEM <=0.25 SENSITIVE Sensitive     TRIMETH/SULFA <=20 SENSITIVE Sensitive     AMPICILLIN/SULBACTAM <=2 SENSITIVE Sensitive     PIP/TAZO <=4 SENSITIVE Sensitive     * ESCHERICHIA COLI   Klebsiella pneumoniae - MIC*    AMPICILLIN >=32 RESISTANT Resistant     CEFAZOLIN <=4 SENSITIVE Sensitive     CEFEPIME <=0.12 SENSITIVE Sensitive     CEFTAZIDIME <=1 SENSITIVE Sensitive     CEFTRIAXONE <=0.25 SENSITIVE Sensitive     CIPROFLOXACIN <=0.25 SENSITIVE Sensitive     GENTAMICIN <=1 SENSITIVE Sensitive     IMIPENEM <=0.25 SENSITIVE Sensitive     TRIMETH/SULFA <=20 SENSITIVE Sensitive     AMPICILLIN/SULBACTAM 4 SENSITIVE Sensitive     PIP/TAZO <=4 SENSITIVE Sensitive     * KLEBSIELLA PNEUMONIAE   Proteus penneri - MIC*    AMPICILLIN >=32 RESISTANT Resistant     CEFAZOLIN >=64 RESISTANT Resistant     CEFEPIME <=0.12 SENSITIVE Sensitive     CEFTAZIDIME <=1 SENSITIVE Sensitive     CEFTRIAXONE <=0.25 SENSITIVE Sensitive     CIPROFLOXACIN <=0.25 SENSITIVE Sensitive     GENTAMICIN <=1 SENSITIVE Sensitive     IMIPENEM 2 SENSITIVE Sensitive     TRIMETH/SULFA <=20 SENSITIVE Sensitive     AMPICILLIN/SULBACTAM 16 INTERMEDIATE Intermediate     PIP/TAZO <=4 SENSITIVE Sensitive     * PROTEUS PENNERI  Blood Culture ID Panel  (Reflexed)     Status: Abnormal   Collection Time: 02/24/20  2:47 AM  Result Value Ref Range Status   Enterococcus species NOT DETECTED NOT DETECTED Final   Listeria monocytogenes NOT DETECTED NOT DETECTED Final   Staphylococcus species NOT DETECTED NOT DETECTED Final   Staphylococcus aureus (BCID) NOT DETECTED NOT DETECTED Final   Streptococcus species NOT DETECTED NOT DETECTED Final   Streptococcus agalactiae NOT DETECTED NOT DETECTED Final   Streptococcus pneumoniae NOT DETECTED NOT DETECTED Final   Streptococcus pyogenes NOT DETECTED NOT DETECTED Final   Acinetobacter baumannii NOT DETECTED NOT DETECTED Final   Enterobacteriaceae species DETECTED (A) NOT DETECTED Final    Comment: CRITICAL RESULT CALLED TO, READ BACK BY AND VERIFIED WITH: PHARMD MIHN P. [phone] FCP    Enterobacter cloacae complex NOT DETECTED NOT DETECTED Final   Escherichia coli DETECTED (A) NOT DETECTED Final    Comment: CRITICAL RESULT CALLED TO, READ BACK BY AND VERIFIED WITH: PHARMD MIHN P. [id number] FCP    Klebsiella oxytoca NOT DETECTED NOT DETECTED Final   Klebsiella pneumoniae DETECTED (A) NOT DETECTED Final    Comment: CRITICAL RESULT CALLED TO, READ BACK BY AND VERIFIED WITH: PHARMD MIHN P. [phone] FCP    Proteus species DETECTED (A) NOT DETECTED Final    Comment: CRITICAL RESULT CALLED TO, READ BACK BY AND VERIFIED WITH: PHARMD MIHN P. [phone] FCP    Serratia marcescens NOT DETECTED NOT DETECTED Final   Carbapenem resistance NOT DETECTED NOT DETECTED Final   Haemophilus influenzae NOT DETECTED NOT DETECTED Final   Neisseria  meningitidis NOT DETECTED NOT DETECTED Final   Pseudomonas aeruginosa NOT DETECTED NOT DETECTED Final   Candida albicans NOT DETECTED NOT DETECTED Final   Candida glabrata NOT DETECTED NOT DETECTED Final   Candida krusei NOT DETECTED NOT DETECTED Final   Candida parapsilosis NOT DETECTED NOT DETECTED Final   Candida tropicalis NOT DETECTED NOT DETECTED Final     Comment: Performed at [facility], [place] [address], [place], [place] [address]  Blood culture (routine x 2)     Status: Abnormal   Collection Time: 02/24/20  3:56 AM   Specimen: BLOOD LEFT HAND  Result Value Ref Range Status   Specimen Description BLOOD LEFT HAND  Final   Special Requests   Final    BOTTLES DRAWN AEROBIC ONLY Blood Culture results may not be optimal due to an inadequate volume of blood received in culture bottles   Culture  Setup Time   Final    AEROBIC BOTTLE ONLY GRAM NEGATIVE RODS CRITICAL VALUE NOTED.  VALUE IS CONSISTENT WITH PREVIOUSLY REPORTED AND CALLED VALUE.    Culture (A)  Final    PROTEUS PENNERI SUSCEPTIBILITIES PERFORMED ON PREVIOUS CULTURE WITHIN THE LAST 5 DAYS. Performed at [facility], [place] [address], [place], [place] [address]    Report Status 02/27/2020 FINAL  Final  SARS Coronavirus 2 by RT PCR (hospital order, performed in [facility] hospital lab) Nasopharyngeal Nasopharyngeal Swab     Status: None   Collection Time: 02/24/20  6:04 AM   Specimen: Nasopharyngeal Swab  Result Value Ref Range Status   SARS Coronavirus 2 NEGATIVE NEGATIVE Final    Comment: (NOTE) SARS-CoV-2 target nucleic acids are NOT DETECTED.  The SARS-CoV-2 RNA is generally detectable in upper and lower respiratory specimens during the acute phase of infection. The lowest concentration of SARS-CoV-2 viral copies this assay can detect is 250 copies / mL. A negative result does not preclude SARS-CoV-2 infection and should not be used as the sole basis for treatment or other patient management decisions.  A negative result may occur with improper specimen collection / handling, submission of specimen other than nasopharyngeal swab, presence of viral mutation(s) within the areas targeted by this assay, and inadequate number of viral copies (<250 copies / mL). A negative result must be combined with clinical observations, patient history, and epidemiological  information.  Fact Sheet for Patients:   StrictlyIdeas.no  Fact Sheet for Healthcare Providers: BankingDealers.co.za  This test is not yet approved or  cleared by the [place] FDA and has been authorized for detection and/or diagnosis of SARS-CoV-2 by FDA under an Emergency Use Authorization (EUA).  This EUA will remain in effect (meaning this test can be used) for the duration of the COVID-19 declaration under Section 564(b)(1) of the Act, 21 U.S.C. section 360bbb-3(b)(1), unless the authorization is terminated or revoked sooner.  Performed at [facility], [place] [address], [place], [place] [address]   C Difficile Quick Screen w PCR reflex     Status: None   Collection Time: 02/25/20  4:21 AM   Specimen: STOOL  Result Value Ref Range Status   C Diff antigen NEGATIVE NEGATIVE Final   C Diff toxin NEGATIVE NEGATIVE Final   C Diff interpretation No C. difficile detected.  Final    Comment: Performed at [facility], [place] [address], [place], [place] [address]  Gastrointestinal Panel by PCR , Stool     Status: None   Collection Time: 02/25/20  4:21  AM   Specimen: STOOL  Result Value Ref Range Status   Campylobacter species NOT DETECTED NOT DETECTED Final   Plesimonas shigelloides NOT DETECTED NOT DETECTED Final   Salmonella species NOT DETECTED NOT DETECTED Final   Yersinia enterocolitica NOT DETECTED NOT DETECTED Final   Vibrio species NOT DETECTED NOT DETECTED Final   Vibrio cholerae NOT DETECTED NOT DETECTED Final   Enteroaggregative E coli (EAEC) NOT DETECTED NOT DETECTED Final   Enteropathogenic E coli (EPEC) NOT DETECTED NOT DETECTED Final   Enterotoxigenic E coli (ETEC) NOT DETECTED NOT DETECTED Final   Shiga like toxin producing E coli (STEC) NOT DETECTED NOT DETECTED Final   Shigella/Enteroinvasive E coli (EIEC) NOT DETECTED NOT DETECTED Final   Cryptosporidium NOT DETECTED NOT DETECTED Final   Cyclospora  cayetanensis NOT DETECTED NOT DETECTED Final   Entamoeba histolytica NOT DETECTED NOT DETECTED Final   Giardia lamblia NOT DETECTED NOT DETECTED Final   Adenovirus F40/41 NOT DETECTED NOT DETECTED Final   Astrovirus NOT DETECTED NOT DETECTED Final   Norovirus GI/GII NOT DETECTED NOT DETECTED Final   Rotavirus A NOT DETECTED NOT DETECTED Final   Sapovirus (I, II, IV, and V) NOT DETECTED NOT DETECTED Final    Comment: Performed at [facility], [place] [place], [place] [address]  MRSA PCR Screening     Status: None   Collection Time: 02/25/20  4:57 AM  Result Value Ref Range Status   MRSA by PCR NEGATIVE NEGATIVE Final    Comment:        The GeneXpert MRSA Assay (FDA approved for NASAL specimens only), is one component of a comprehensive MRSA colonization surveillance program. It is not intended to diagnose MRSA infection nor to guide or monitor treatment for MRSA infections. Performed at [facility], [place] [address], [place], [place] [address]   Gram stain     Status: None   Collection Time: 02/25/20  3:21 PM   Specimen: Abdomen  Result Value Ref Range Status   Specimen Description ABDOMEN  Final   Special Requests PERITONEAL FLUID  Final   Gram Stain   Final    ABUNDANT WBC PRESENT, PREDOMINANTLY PMN NO ORGANISMS SEEN Performed at [facility], [place] [address], [place], [place] [address]    Report Status 02/25/2020 FINAL  Final  Culture, body fluid-bottle     Status: None (Preliminary result)   Collection Time: 02/25/20  3:21 PM   Specimen: Abdomen  Result Value Ref Range Status   Specimen Description ABDOMEN  Final   Special Requests PERITONEAL FLUID  Final   Culture   Final    NO GROWTH 3 DAYS Performed at [place] [address] [place], [place] [address]    Report Status PENDING  Incomplete    Radiology Reports CT ABDOMEN PELVIS W CONTRAST  Result Date: 02/28/2020 CLINICAL DATA:  Abdominal abscess/infection suspected  Abdominal pain.  Patient had recent paracentesis. EXAM: CT ABDOMEN AND PELVIS WITH CONTRAST TECHNIQUE: Multidetector CT imaging of the abdomen and pelvis was performed using the standard protocol following bolus administration of intravenous contrast. CONTRAST:  163mL OMNIPAQUE IOHEXOL 300 MG/ML  SOLN COMPARISON:  Abdominal CT and ultrasound 4 days ago, 02/24/2020. multiple prior CT. FINDINGS: Lower chest: Calcified granuloma in the right lower lobe. Linear atelectasis in the left lower lobe. No pleural fluid. Heart is normal in size. Hepatobiliary: Cirrhotic hepatic morphology with nodular contours. The liver is enlarged spanning 26.7 cm cranial caudal. Again seen 1.2  cm low-density lesion in the inferior right lobe of the liver, series 3, image 65, unchanged from recent exam. No significant peripheral enhancement. There multiple small subcentimeter adjacent hepatic hypodensities in the right lobe extending from images 59 through 66. Subcentimeter vague hypodensity in the central right lobe series 3, image 39. Postcholecystectomy. Biliary stent remains in place, no significant intra or extrahepatic biliary ductal dilatation. Mild pneumobilia. Pancreas: No ductal dilatation or inflammation. Spleen: No splenomegaly. Unchanged cleft posteriorly. No focal lesion. Adrenals/Urinary Tract: No adrenal nodule. No hydronephrosis or perinephric edema. Homogeneous renal enhancement with symmetric excretion on delayed phase imaging. Urinary bladder is partially distended without wall thickening. Stomach/Bowel: Patulous distal esophagus. Nondistended stomach. Normal positioning of the duodenum and ligament of Treitz. Administered enteric contrast reaches the colon. There is no bowel obstruction. No significant bowel inflammation. Appendix is air-filled and otherwise normal. Distal colonic diverticulosis without evidence of diverticulitis. Vascular/Lymphatic: Normal caliber abdominal aorta. No evidence of portal vein thrombosis.  No bulky abdominopelvic adenopathy. Reproductive: Prominent prostate gland. Other: Moderate volume abdominopelvic ascites. This measures simple fluid density. There is no peripheral enhancement or loculation. No free intra-abdominal air. Mesenteric and omental edema is typical of ascites. Small umbilical hernia containing fat and small amount of ascites. Mild skin thickening adjacent to the umbilical hernia in the midline. Fat in both inguinal canals. Musculoskeletal: There are no acute or suspicious osseous abnormalities. Peripherally sclerotic lesion in the left femoral head is unchanged from prior exams. 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 IMPRESSION: 1. Stable low-density lesions in the right lobe of the liver, largest measuring 1.2 cm, suspicious for small abscesses. No significant progression or convalescence. Vague subcentimeter structure in the central liver was not definitively seen on prior exam, unclear if this represents a new area of infection or better seen currently due to differences in phase of contrast. 2. Moderate volume abdominopelvic ascites. There is no peripheral enhancement or loculation. 3. Cirrhosis. Biliary stent remains in place, no significant intra or extrahepatic biliary ductal dilatation. 4. Colonic diverticulosis without diverticulitis. 5. Small umbilical hernia containing fat and small amount of ascites. Mild skin thickening adjacent to the umbilical hernia in the midline. 6. Additional stable chronic findings as described. Electronically Signed   By: Keith Rake M.D.   On: 02/28/2020 15:25   CT ABDOMEN PELVIS W CONTRAST  Result Date: 02/24/2020 CLINICAL DATA:  Generalized abdominal pain, swelling, and jaundice with vomiting and fever. Leukocytosis. EXAM: CT ABDOMEN AND PELVIS WITH CONTRAST TECHNIQUE: Multidetector CT imaging of the abdomen and pelvis was performed  using the standard protocol following bolus administration of intravenous contrast. CONTRAST:  166mL OMNIPAQUE IOHEXOL 300 MG/ML  SOLN COMPARISON:  12/24/2019 FINDINGS: Lower chest: Punctate calcified nodules in the right middle lobe and right lower lobe. No basilar lung consolidation or pleural effusion. Hepatobiliary: There is a new 1.2 cm hypodense lesion inferiorly in the right hepatic lobe (series 3, image 63), and there are additional more subtle subcentimeter hypodensities in the inferior right hepatic lobe which also appear new (for example series 3, images 56, 58, 59, and 62). Status post cholecystectomy. A biliary stent remains in place without significant dilatation of the common bile duct, however there is increased dilatation of the common hepatic duct about the proximal aspect of the stent measuring 2.1 cm in diameter. There is at most minimal central intrahepatic biliary dilatation. Pancreas: Unremarkable. Spleen: No focal lesion. Unchanged cleft along the posterior splenic margin. Adrenals/Urinary  Tract: Unremarkable adrenal glands. No evidence of renal mass, calculi, or hydronephrosis. Unremarkable bladder. Stomach/Bowel: The stomach is unremarkable. There is no evidence of bowel obstruction. Mildly prominent gaseous distension of the appendix without evidence of appendicitis. Vascular/Lymphatic: No significant vascular findings are present. No enlarged abdominal or pelvic lymph nodes. Reproductive: Mildly enlarged prostate. Other: Increased small to moderate volume ascites. No loculated fluid collection. No pneumoperitoneum. Small umbilical hernia containing fat and fluid. Musculoskeletal: No acute osseous abnormality or suspicious osseous lesion. IMPRESSION: 1. New small ill-defined hypodensities inferiorly in the right hepatic lobe, nonspecific though infection/small abscesses are a concern with this history. 2. Increased small to moderate volume ascites. 3. Unchanged biliary stent with increased  dilatation of the common hepatic duct. 4. Aortic Atherosclerosis (ICD10-I70.0). Electronically Signed   By: Logan Bores M.D.   On: 02/24/2020 05:42   DG Chest Port 1 View  Result Date: 02/27/2020 CLINICAL DATA:  Shortness of breath.  Sepsis. EXAM: PORTABLE CHEST 1 VIEW COMPARISON:  12/03/2019 FINDINGS: Lordotic technique is demonstrated. Lungs are adequately inflated without focal airspace consolidation or effusion. Borderline stable cardiomegaly. Remainder of the exam is unchanged. IMPRESSION: No acute cardiopulmonary disease. Electronically Signed   By: Marin Olp M.D.   On: 02/27/2020 09:13   DG ERCP BILIARY & PANCREATIC DUCTS  Result Date: 02/24/2020 CLINICAL DATA:  ERCP. EXAM: ERCP TECHNIQUE: Multiple spot images obtained with the fluoroscopic device and submitted for interpretation post-procedure. FLUOROSCOPY TIME:  Fluoroscopy Time:  2 minutes and 13 seconds Radiation Exposure Index (if provided by the fluoroscopic device): 66.82 mGy Number of Acquired Spot Images: 6 COMPARISON:  CT dated February 24, 2020.  11/29/2019 FINDINGS: And insult spot images demonstrate a common bile duct stent. The stent has been removed. Injection of contrast followed by balloon sweep demonstrates a persistent narrowing of the mid and distal common bile duct. There appears to be some mild intrahepatic biliary ductal dilatation. The patient is status post prior cholecystectomy. Final images demonstrate replacement of the plastic biliary stent. IMPRESSION: Status post ERCP as detailed above. These images were submitted for radiologic interpretation only. Please see the procedural report for the amount of contrast and the fluoroscopy time utilized. Electronically Signed   By: Constance Holster M.D.   On: 02/24/2020 18:49   US Abdomen Limited RUQ  Result Date: 02/24/2020 CLINICAL DATA:  Jaundice EXAM: ULTRASOUND ABDOMEN LIMITED RIGHT UPPER QUADRANT COMPARISON:  12/24/2019 FINDINGS: Gallbladder: Surgically absent Common  bile duct: Diameter: 8 mm Liver: Nodularity of the liver capsule compatible with cirrhosis. No focal parenchymal abnormality. No intrahepatic duct dilation. Portal vein is patent on color Doppler imaging with normal direction of blood flow towards the liver. Other: There is a small amount of ascites throughout the abdomen, greatest in the lower quadrants. IMPRESSION: 1. Small volume ascites. 2. Findings consistent with hepatic cirrhosis. 3. Cholecystectomy. Electronically Signed   By: Randa Ngo M.D.   On: 02/24/2020 03:23   IR Paracentesis  Result Date: 02/28/2020 INDICATION: Abdominal pain, distention. Recurrent ascites. Request for therapeutic paracentesis. EXAM: ULTRASOUND GUIDED LEFT LOWER QUADRANT PARACENTESIS MEDICATIONS: None. COMPLICATIONS: None immediate. PROCEDURE: Informed written consent was obtained from the patient after a discussion of the risks, benefits and alternatives to treatment. A timeout was performed prior to the initiation of the procedure. Initial ultrasound scanning demonstrates a small to moderate amount of ascites within the left lower abdominal quadrant. The left lower abdomen was prepped and draped in the usual sterile fashion. 1% lidocaine was used for local anesthesia. Following this,  a 19 gauge, 7-cm, Yueh catheter was introduced. An ultrasound image was saved for documentation purposes. The paracentesis was performed. The catheter was removed and a dressing was applied. The patient tolerated the procedure well without immediate post procedural complication. FINDINGS: A total of approximately 1.9 L of clear yellow fluid was removed. IMPRESSION: Successful ultrasound-guided paracentesis yielding 1.9 liters of peritoneal fluid. Read by: Ascencion Dike PA-C Electronically Signed   By: Corrie Mckusick D.O.   On: 02/28/2020 16:23   IR Paracentesis  Result Date: 02/25/2020 INDICATION: Patient with a history of alcoholic hepatitis and ascites presents today for a therapeutic and  diagnostic paracentesis. EXAM: ULTRASOUND GUIDED PARACENTESIS MEDICATIONS: 1% lidocaine 10 mL COMPLICATIONS: None immediate PROCEDURE: Informed written consent was obtained from the patient after a discussion of the risks, benefits and alternatives to treatment. A timeout was performed prior to the initiation of the procedure. Initial ultrasound scanning demonstrates a large amount of ascites within the left lower abdominal quadrant. The left lower abdomen was prepped and draped in the usual sterile fashion. 1% lidocaine was used for local anesthesia. Following this, a 19 gauge, 7-cm, Yueh catheter was introduced. An ultrasound image was saved for documentation purposes. The paracentesis was performed. The catheter was removed and a dressing was applied. The patient tolerated the procedure well without immediate post procedural complication. FINDINGS: A total of approximately 2.2 L of dark yellow fluid was removed. Samples were sent to the laboratory as requested by the clinical team. IMPRESSION: Successful ultrasound-guided paracentesis yielding 2.2 liters of peritoneal fluid. Read by: Soyla Dryer, NP Electronically Signed   By: Lucrezia Europe M.D.   On: 02/25/2020 14:28    Time Spent in minutes  30   Lala Lund M.D on 02/29/2020 at 9:30 AM  To page go to [email or web address] - password [facility]

## 2020-02-29 NOTE — Progress Notes (Signed)
[facility] Progress Note  Subjective: The patient feels well today.  Denies abdominal pain.  He had 1.5 L of ascitic fluid removed yesterday.  He is eating.  His abdomen feels better.  Objective: Vital signs in last 24 hours: Temp:  [97.7 F (36.5 C)-99.5 F (37.5 C)] 98.1 F (36.7 C) (07/27 0740) Pulse Rate:  [72-92] 77 (07/27 0740) Resp:  [15-21] 18 (07/27 0740) BP: (115-131)/(68-88) 121/88 (07/27 0740) SpO2:  [93 %-100 %] 93 % (07/27 0740) Weight change:    PE:  No distress  Abdomen soft, not tender, ascites present  CT was reviewed from yesterday.  No evidence of diverticulitis.  Question of small abscesses in the right lobe of the liver.  He is on antibiotics.  Lab Results: Results for orders placed or performed during the hospital encounter of 02/23/20 (from the past 24 hour(s))  Protime-INR     Status: Abnormal   Collection Time: 02/29/20  3:44 AM  Result Value Ref Range   Prothrombin Time 15.6 (H) 11.4 - 15.2 seconds   INR 1.3 (H) 0.8 - 1.2  Procalcitonin     Status: None   Collection Time: 02/29/20  3:44 AM  Result Value Ref Range   Procalcitonin 1.01 ng/mL  Magnesium     Status: None   Collection Time: 02/29/20  3:44 AM  Result Value Ref Range   Magnesium 1.8 1.7 - 2.4 mg/dL  Comprehensive metabolic panel     Status: Abnormal   Collection Time: 02/29/20  3:44 AM  Result Value Ref Range   Sodium 132 (L) 135 - 145 mmol/L   Potassium 3.8 3.5 - 5.1 mmol/L   Chloride 100 98 - 111 mmol/L   CO2 25 22 - 32 mmol/L   Glucose, Bld 103 (H) 70 - 99 mg/dL   BUN 9 6 - 20 mg/dL   Creatinine, Ser 0.90 0.61 - 1.24 mg/dL   Calcium 7.8 (L) 8.9 - 10.3 mg/dL   Total Protein 5.5 (L) 6.5 - 8.1 g/dL   Albumin 2.0 (L) 3.5 - 5.0 g/dL   AST 70 (H) 15 - 41 U/L   ALT 45 (H) 0 - 44 U/L   Alkaline Phosphatase 104 38 - 126 U/L   Total Bilirubin 3.1 (H) 0.3 - 1.2 mg/dL   GFR calc non Af Amer >60 >60 mL/min   GFR calc Af Amer >60 >60 mL/min   Anion gap 7 5 - 15  CBC  with Differential/Platelet     Status: Abnormal   Collection Time: 02/29/20  3:44 AM  Result Value Ref Range   WBC 14.9 (H) 4.0 - 10.5 K/uL   RBC 4.39 4.22 - 5.81 MIL/uL   Hemoglobin 14.2 13.0 - 17.0 g/dL   HCT 41.2 39 - 52 %   MCV 93.8 80.0 - 100.0 fL   MCH 32.3 26.0 - 34.0 pg   MCHC 34.5 30.0 - 36.0 g/dL   RDW 16.6 (H) 11.5 - 15.5 %   Platelets 302 150 - 400 K/uL   nRBC 0.0 0.0 - 0.2 %   Neutrophils Relative % 79 %   Neutro Abs 11.7 (H) 1.7 - 7.7 K/uL   Lymphocytes Relative 12 %   Lymphs Abs 1.8 0.7 - 4.0 K/uL   Monocytes Relative 7 %   Monocytes Absolute 1.1 (H) 0 - 1 K/uL   Eosinophils Relative 0 %   Eosinophils Absolute 0.1 0 - 0 K/uL   Basophils Relative 1 %   Basophils Absolute 0.1 0 - 0  K/uL   Immature Granulocytes 1 %   Abs Immature Granulocytes 0.21 (H) 0.00 - 0.07 K/uL  Brain natriuretic peptide     Status: None   Collection Time: 02/29/20  3:44 AM  Result Value Ref Range   B Natriuretic Peptide 62.9 0.0 - 100.0 pg/mL  C-reactive protein     Status: Abnormal   Collection Time: 02/29/20  3:44 AM  Result Value Ref Range   CRP 3.3 (H) <1.0 mg/dL    Studies/Results: CT ABDOMEN PELVIS W CONTRAST  Result Date: 02/28/2020 CLINICAL DATA:  Abdominal abscess/infection suspected Abdominal pain.  Patient had recent paracentesis. EXAM: CT ABDOMEN AND PELVIS WITH CONTRAST TECHNIQUE: Multidetector CT imaging of the abdomen and pelvis was performed using the standard protocol following bolus administration of intravenous contrast. CONTRAST:  110mL OMNIPAQUE IOHEXOL 300 MG/ML  SOLN COMPARISON:  Abdominal CT and ultrasound 4 days ago, 02/24/2020. multiple prior CT. FINDINGS: Lower chest: Calcified granuloma in the right lower lobe. Linear atelectasis in the left lower lobe. No pleural fluid. Heart is normal in size. Hepatobiliary: Cirrhotic hepatic morphology with nodular contours. The liver is enlarged spanning 26.7 cm cranial caudal. Again seen 1.2 cm low-density lesion in the  inferior right lobe of the liver, series 3, image 65, unchanged from recent exam. No significant peripheral enhancement. There multiple small subcentimeter adjacent hepatic hypodensities in the right lobe extending from images 59 through 66. Subcentimeter vague hypodensity in the central right lobe series 3, image 39. Postcholecystectomy. Biliary stent remains in place, no significant intra or extrahepatic biliary ductal dilatation. Mild pneumobilia. Pancreas: No ductal dilatation or inflammation. Spleen: No splenomegaly. Unchanged cleft posteriorly. No focal lesion. Adrenals/Urinary Tract: No adrenal nodule. No hydronephrosis or perinephric edema. Homogeneous renal enhancement with symmetric excretion on delayed phase imaging. Urinary bladder is partially distended without wall thickening. Stomach/Bowel: Patulous distal esophagus. Nondistended stomach. Normal positioning of the duodenum and ligament of Treitz. Administered enteric contrast reaches the colon. There is no bowel obstruction. No significant bowel inflammation. Appendix is air-filled and otherwise normal. Distal colonic diverticulosis without evidence of diverticulitis. Vascular/Lymphatic: Normal caliber abdominal aorta. No evidence of portal vein thrombosis. No bulky abdominopelvic adenopathy. Reproductive: Prominent prostate gland. Other: Moderate volume abdominopelvic ascites. This measures simple fluid density. There is no peripheral enhancement or loculation. No free intra-abdominal air. Mesenteric and omental edema is typical of ascites. Small umbilical hernia containing fat and small amount of ascites. Mild skin thickening adjacent to the umbilical hernia in the midline. Fat in both inguinal canals. Musculoskeletal: There are no acute or suspicious osseous abnormalities. Peripherally sclerotic lesion in the left femoral head is unchanged from prior exams. 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 IMPRESSION: 1. Stable low-density lesions in the right lobe of the liver, largest measuring 1.2 cm, suspicious for small abscesses. No significant progression or convalescence. Vague subcentimeter structure in the central liver was not definitively seen on prior exam, unclear if this represents a new area of infection or better seen currently due to differences in phase of contrast. 2. Moderate volume abdominopelvic ascites. There is no peripheral enhancement or loculation. 3. Cirrhosis. Biliary stent remains in place, no significant intra or extrahepatic biliary ductal dilatation. 4. Colonic diverticulosis without diverticulitis. 5. Small umbilical hernia containing fat and small amount of ascites. Mild skin thickening adjacent to the umbilical hernia in the midline. 6. Additional stable chronic findings as described. Electronically Signed   By: Aurther Loft.D.  On: 02/28/2020 15:25   IR Paracentesis  Result Date: 02/28/2020 INDICATION: Abdominal pain, distention. Recurrent ascites. Request for therapeutic paracentesis. EXAM: ULTRASOUND GUIDED LEFT LOWER QUADRANT PARACENTESIS MEDICATIONS: None. COMPLICATIONS: None immediate. PROCEDURE: Informed written consent was obtained from the patient after a discussion of the risks, benefits and alternatives to treatment. A timeout was performed prior to the initiation of the procedure. Initial ultrasound scanning demonstrates a small to moderate amount of ascites within the left lower abdominal quadrant. The left lower abdomen was prepped and draped in the usual sterile fashion. 1% lidocaine was used for local anesthesia. Following this, a 19 gauge, 7-cm, Yueh catheter was introduced. An ultrasound image was saved for documentation purposes. The paracentesis was performed. The catheter was removed and a dressing was applied. The patient tolerated the procedure well without immediate post procedural  complication. FINDINGS: A total of approximately 1.9 L of clear yellow fluid was removed. IMPRESSION: Successful ultrasound-guided paracentesis yielding 1.9 liters of peritoneal fluid. Read by: Ascencion Dike PA-C Electronically Signed   By: Corrie Mckusick D.O.   On: 02/28/2020 16:23      Assessment: Cirrhosis of liver  Ascites  Possible small abscesses in the right lobe of liver  Biliary stent in place status post lap chole with bile leak  Plan:   Continue current manage    Cassell Clement 02/29/2020, 9:40 AM  Pager: [phone] If no answer or after 5 PM call [phone]

## 2020-02-29 NOTE — Progress Notes (Signed)
[facility] Progress Note  5 Days Post-Op  Subjective: CC:  Denies pain. Tolerating PO. Denies BM.   Objective: Vital signs in last 24 hours: Temp:  [97.7 F (36.5 C)-99.5 F (37.5 C)] 98.1 F (36.7 C) (07/27 0740) Pulse Rate:  [72-92] 77 (07/27 0740) Resp:  [15-21] 18 (07/27 0740) BP: (115-131)/(68-88) 121/88 (07/27 0740) SpO2:  [93 %-100 %] 93 % (07/27 0740) Last BM Date: 02/28/20  Intake/Output from previous day: 07/26 0701 - 07/27 0700 In: 1569.1 [P.O.:760; I.V.:557.5; IV Piggyback:251.6] Out: 2150 [Urine:2150] Intake/Output this shift: Total I/O In: 240 [P.O.:240] Out: 300 [Urine:300]  PE: Gen:  Alert, NAD, pleasant Card:  Regular rate and rhythm, pedal pulses 2+ BL Pulm:  Normal effort, clear to auscultation bilaterally Abd: Soft, protuberant, trochar sites appear well-healing, mild TTP umbilical incision without guarding or peritonitis, +BS Skin: warm and dry, no rashes  Psych: A&Ox3   Lab Results:  Recent Labs    02/28/20 0401 02/29/20 0344  WBC 12.2* 14.9*  HGB 14.9 14.2  HCT 43.4 41.2  PLT 311 302   BMET Recent Labs    02/28/20 0401 02/29/20 0344  NA 136 132*  K 3.7 3.8  CL 102 100  CO2 25 25  GLUCOSE 108* 103*  BUN 15 9  CREATININE 1.01 0.90  CALCIUM 7.9* 7.8*   PT/INR Recent Labs    02/28/20 0401 02/29/20 0344  LABPROT 14.9 15.6*  INR 1.2 1.3*   CMP     Component Value Date/Time   NA 132 (L) 02/29/2020 0344   K 3.8 02/29/2020 0344   CL 100 02/29/2020 0344   CO2 25 02/29/2020 0344   GLUCOSE 103 (H) 02/29/2020 0344   BUN 9 02/29/2020 0344   CREATININE 0.90 02/29/2020 0344   CALCIUM 7.8 (L) 02/29/2020 0344   PROT 5.5 (L) 02/29/2020 0344   ALBUMIN 2.0 (L) 02/29/2020 0344   AST 70 (H) 02/29/2020 0344   ALT 45 (H) 02/29/2020 0344   ALKPHOS 104 02/29/2020 0344   BILITOT 3.1 (H) 02/29/2020 0344   GFRNONAA >60 02/29/2020 0344   GFRAA >60 02/29/2020 0344   Lipase     Component Value Date/Time   LIPASE 118 (H)  02/23/2020 1531       Studies/Results: CT ABDOMEN PELVIS W CONTRAST  Result Date: 02/28/2020 CLINICAL DATA:  Abdominal abscess/infection suspected Abdominal pain.  Patient had recent paracentesis. EXAM: CT ABDOMEN AND PELVIS WITH CONTRAST TECHNIQUE: Multidetector CT imaging of the abdomen and pelvis was performed using the standard protocol following bolus administration of intravenous contrast. CONTRAST:  158mL OMNIPAQUE IOHEXOL 300 MG/ML  SOLN COMPARISON:  Abdominal CT and ultrasound 4 days ago, 02/24/2020. multiple prior CT. FINDINGS: Lower chest: Calcified granuloma in the right lower lobe. Linear atelectasis in the left lower lobe. No pleural fluid. Heart is normal in size. Hepatobiliary: Cirrhotic hepatic morphology with nodular contours. The liver is enlarged spanning 26.7 cm cranial caudal. Again seen 1.2 cm low-density lesion in the inferior right lobe of the liver, series 3, image 65, unchanged from recent exam. No significant peripheral enhancement. There multiple small subcentimeter adjacent hepatic hypodensities in the right lobe extending from images 59 through 66. Subcentimeter vague hypodensity in the central right lobe series 3, image 39. Postcholecystectomy. Biliary stent remains in place, no significant intra or extrahepatic biliary ductal dilatation. Mild pneumobilia. Pancreas: No ductal dilatation or inflammation. Spleen: No splenomegaly. Unchanged cleft posteriorly. No focal lesion. Adrenals/Urinary Tract: No adrenal nodule. No hydronephrosis or perinephric edema. Homogeneous renal enhancement with  symmetric excretion on delayed phase imaging. Urinary bladder is partially distended without wall thickening. Stomach/Bowel: Patulous distal esophagus. Nondistended stomach. Normal positioning of the duodenum and ligament of Treitz. Administered enteric contrast reaches the colon. There is no bowel obstruction. No significant bowel inflammation. Appendix is air-filled and otherwise normal.  Distal colonic diverticulosis without evidence of diverticulitis. Vascular/Lymphatic: Normal caliber abdominal aorta. No evidence of portal vein thrombosis. No bulky abdominopelvic adenopathy. Reproductive: Prominent prostate gland. Other: Moderate volume abdominopelvic ascites. This measures simple fluid density. There is no peripheral enhancement or loculation. No free intra-abdominal air. Mesenteric and omental edema is typical of ascites. Small umbilical hernia containing fat and small amount of ascites. Mild skin thickening adjacent to the umbilical hernia in the midline. Fat in both inguinal canals. Musculoskeletal: There are no acute or suspicious osseous abnormalities. Peripherally sclerotic lesion in the left femoral head is unchanged from prior exams. 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 IMPRESSION: 1. Stable low-density lesions in the right lobe of the liver, largest measuring 1.2 cm, suspicious for small abscesses. No significant progression or convalescence. Vague subcentimeter structure in the central liver was not definitively seen on prior exam, unclear if this represents a new area of infection or better seen currently due to differences in phase of contrast. 2. Moderate volume abdominopelvic ascites. There is no peripheral enhancement or loculation. 3. Cirrhosis. Biliary stent remains in place, no significant intra or extrahepatic biliary ductal dilatation. 4. Colonic diverticulosis without diverticulitis. 5. Small umbilical hernia containing fat and small amount of ascites. Mild skin thickening adjacent to the umbilical hernia in the midline. 6. Additional stable chronic findings as described. Electronically Signed   By: Keith Rake M.D.   On: 02/28/2020 15:25   IR Paracentesis  Result Date: 02/28/2020 INDICATION: Abdominal pain, distention. Recurrent ascites. Request for therapeutic  paracentesis. EXAM: ULTRASOUND GUIDED LEFT LOWER QUADRANT PARACENTESIS MEDICATIONS: None. COMPLICATIONS: None immediate. PROCEDURE: Informed written consent was obtained from the patient after a discussion of the risks, benefits and alternatives to treatment. A timeout was performed prior to the initiation of the procedure. Initial ultrasound scanning demonstrates a small to moderate amount of ascites within the left lower abdominal quadrant. The left lower abdomen was prepped and draped in the usual sterile fashion. 1% lidocaine was used for local anesthesia. Following this, a 19 gauge, 7-cm, Yueh catheter was introduced. An ultrasound image was saved for documentation purposes. The paracentesis was performed. The catheter was removed and a dressing was applied. The patient tolerated the procedure well without immediate post procedural complication. FINDINGS: A total of approximately 1.9 L of clear yellow fluid was removed. IMPRESSION: Successful ultrasound-guided paracentesis yielding 1.9 liters of peritoneal fluid. Read by: Ascencion Dike PA-C Electronically Signed   By: Corrie Mckusick D.O.   On: 02/28/2020 16:23    Anti-infectives: Anti-infectives (From admission, onward)   Start     Dose/Rate Route Frequency Ordered Stop   02/25/20 2000  cefTRIAXone (ROCEPHIN) 2 g in sodium chloride 0.9 % 100 mL IVPB     Discontinue     2 g 200 mL/hr over 30 Minutes Intravenous Every 24 hours 02/24/20 1847     02/25/20 1515  metroNIDAZOLE (FLAGYL) IVPB 500 mg     Discontinue     500 mg 100 mL/hr over 60 Minutes Intravenous Every 8 hours 02/25/20 1455     02/24/20 1830  cefTRIAXone (ROCEPHIN) 2 g in sodium chloride 0.9 % 100 mL  IVPB  Status:  Discontinued        2 g 200 mL/hr over 30 Minutes Intravenous Every 24 hours 02/24/20 1741 02/24/20 1847   02/24/20 1200  piperacillin-tazobactam (ZOSYN) IVPB 3.375 g  Status:  Discontinued        3.375 g 100 mL/hr over 30 Minutes Intravenous Every 6 hours 02/24/20 0934  02/24/20 1009   02/24/20 1200  piperacillin-tazobactam (ZOSYN) IVPB 3.375 g  Status:  Discontinued        3.375 g 12.5 mL/hr over 240 Minutes Intravenous Every 8 hours 02/24/20 1009 02/24/20 1741   02/24/20 0245  piperacillin-tazobactam (ZOSYN) IVPB 3.375 g        3.375 g 100 mL/hr over 30 Minutes Intravenous  Once 02/24/20 0237 02/24/20 0535     Assessment/Plan 11/22/2019 laparoscopic cholecystectomy, Dr. Grandville Silos 11/29/19 ERCP w/ stent placement for bile leak Dr. Watt Climes  12/07/2019 diagnostic laparoscopy, evacuation of bilious ascites (3L), blake drain placement Dr. Donne Hazel 12/23/2019 blake drain removed in [place] office  02/24/2020 ERCP, stent exchange  7/23, 02/28/20 paracentesis  02/28/20 - CT A/P w/ stable low-density liver lesions/small abscesses  liver abscesses, likely pyogenic due to recent biliary surgery and intra-abdominal contamination with bile  -afebrile, VSS, WBC 14.9 from 12  - sepsis improving on IV abx, Rocephin/Flagyl  - RUQ pain is likely caused by liver lesions/abscesses, no acute surgical needs as the patient is clinically improving, consider IR drainage/aspiration of largest abscess for diagnostic/therapeutic purpose vs continuing IV abx alone.    LOS: 5 days    Obie Dredge, [facility] Please see Amion for pager number during day hours 7:00am-4:30pm

## 2020-02-29 NOTE — Progress Notes (Signed)
Utilized [employer] interpreters to assist with translation during shift assessment.  Interpreter ID #[id number].

## 2020-02-29 NOTE — Plan of Care (Signed)

## 2020-02-29 NOTE — Progress Notes (Signed)
°   02/29/20 1954  Assess: MEWS Score  Temp 99 F (37.2 C)  BP (!) 132/66  Pulse Rate 81  ECG Heart Rate 81  Resp (!) 24  Level of Consciousness Alert  SpO2 92 %  O2 Device Room Air  Patient Activity (if Appropriate) In bed  Assess: MEWS Score  MEWS Temp 0  MEWS Systolic 0  MEWS Pulse 0  MEWS RR 1  MEWS LOC 0  MEWS Score 1  MEWS Score Color Green  Assess: if the MEWS score is Yellow or Red  Were vital signs taken at a resting state? Yes  Focused Assessment No change from prior assessment  Early Detection of Sepsis Score *See Row Information* Low  MEWS guidelines implemented *See Row Information* No, other (Comment) (no acute changes)  Document  Progress note created (see row info) Yes

## 2020-03-01 DIAGNOSIS — F10230 Alcohol dependence with withdrawal, uncomplicated: Secondary | ICD-10-CM

## 2020-03-01 DIAGNOSIS — I10 Essential (primary) hypertension: Secondary | ICD-10-CM

## 2020-03-01 LAB — CBC WITH DIFFERENTIAL/PLATELET
Abs Immature Granulocytes: 0.28 10*3/uL — ABNORMAL HIGH (ref 0.00–0.07)
Basophils Absolute: 0.1 10*3/uL (ref 0.0–0.1)
Basophils Relative: 1 %
Eosinophils Absolute: 0.1 10*3/uL (ref 0.0–0.5)
Eosinophils Relative: 1 %
HCT: 43.6 % (ref 39.0–52.0)
Hemoglobin: 14.8 g/dL (ref 13.0–17.0)
Immature Granulocytes: 2 %
Lymphocytes Relative: 21 %
Lymphs Abs: 2.5 10*3/uL (ref 0.7–4.0)
MCH: 32.5 pg (ref 26.0–34.0)
MCHC: 33.9 g/dL (ref 30.0–36.0)
MCV: 95.6 fL (ref 80.0–100.0)
Monocytes Absolute: 1.2 10*3/uL — ABNORMAL HIGH (ref 0.1–1.0)
Monocytes Relative: 10 %
Neutro Abs: 7.8 10*3/uL — ABNORMAL HIGH (ref 1.7–7.7)
Neutrophils Relative %: 65 %
Platelets: 357 10*3/uL (ref 150–400)
RBC: 4.56 MIL/uL (ref 4.22–5.81)
RDW: 16.5 % — ABNORMAL HIGH (ref 11.5–15.5)
WBC: 12.1 10*3/uL — ABNORMAL HIGH (ref 4.0–10.5)
nRBC: 0 % (ref 0.0–0.2)

## 2020-03-01 LAB — COMPREHENSIVE METABOLIC PANEL
ALT: 39 U/L (ref 0–44)
AST: 58 U/L — ABNORMAL HIGH (ref 15–41)
Albumin: 2.2 g/dL — ABNORMAL LOW (ref 3.5–5.0)
Alkaline Phosphatase: 112 U/L (ref 38–126)
Anion gap: 9 (ref 5–15)
BUN: 8 mg/dL (ref 6–20)
CO2: 23 mmol/L (ref 22–32)
Calcium: 8.3 mg/dL — ABNORMAL LOW (ref 8.9–10.3)
Chloride: 102 mmol/L (ref 98–111)
Creatinine, Ser: 0.9 mg/dL (ref 0.61–1.24)
GFR calc Af Amer: >60 mL/min (ref >60)
GFR calc non Af Amer: >60 mL/min (ref >60)
Glucose, Bld: 139 mg/dL — ABNORMAL HIGH (ref 70–99)
Potassium: 3.9 mmol/L (ref 3.5–5.1)
Sodium: 134 mmol/L — ABNORMAL LOW (ref 135–145)
Total Bilirubin: 2.8 mg/dL — ABNORMAL HIGH (ref 0.3–1.2)
Total Protein: 6.4 g/dL — ABNORMAL LOW (ref 6.5–8.1)

## 2020-03-01 LAB — PROTIME-INR
INR: 1.2 (ref 0.8–1.2)
Prothrombin Time: 14.9 seconds (ref 11.4–15.2)

## 2020-03-01 LAB — PROCALCITONIN: Procalcitonin: 0.5 ng/mL

## 2020-03-01 LAB — MAGNESIUM: Magnesium: 1.7 mg/dL (ref 1.7–2.4)

## 2020-03-01 LAB — C-REACTIVE PROTEIN: CRP: 3.8 mg/dL — ABNORMAL HIGH (ref <1.0)

## 2020-03-01 LAB — BRAIN NATRIURETIC PEPTIDE: B Natriuretic Peptide: 98 pg/mL (ref 0.0–100.0)

## 2020-03-01 NOTE — Progress Notes (Signed)
Patient refused morning lab draw.

## 2020-03-01 NOTE — Progress Notes (Signed)
PROGRESS NOTE                                                                                                                                                                                                             Patient Demographics:    Micheal Watson, is a 59 y.o. male, DOB - 03-22-1961, FWY:[id number]  Admit date - 02/23/2020   Admitting Physician Karmen Bongo, MD  Outpatient Primary MD for the patient is Patient, No Pcp Per  LOS - 6  Chief Complaint  Patient presents with  . Abdominal Pain  . Jaundice       Brief Narrative Micheal Watson is a 59 y.o. male with medical history significant of ETOH dependence; recent acute cholecystitis/cholangitis s/p CBD stent and JP drain placement (removed since).  He is now presenting with recurrent LLQ abdomnial pain.   Subjective:   Patient in bed, appears comfortable, denies any headache, no fever, no chest pain or pressure, no shortness of breath , he does report abdominal pain still present, but improved.   Assessment  & Plan :     Sepsis from SBP following intra-abdominal surgery with current hyperbilirubinemia and obstructed CBD stent along with small Hepatic abscess - in a patient with following history  -Hx acute cholecystitis with hydrops; laparoscopic cholecystectomy with postoperative bile leak 11/22/2019 Dr. 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 Clarene Essex -Diagnostic laparoscopy, evacuation of bilious ascites(3L),JP drain placement 12/07/2019 Dr. Donne Hazel -Drain removal 12/23/2019 - ERCP by Dr. Paulita Fujita on 02/24/2020 with CBD biliary stent stricture, stent removal and new stent placed.  Biopsy sent. -Ultrasound-guided paracentesis on 02/24/2021 with 2.2 L of fluid removed - SBP -Repeat therapeutic ultrasound ordered on 02/28/2020 -Repeat CT scan of abdomen pelvis ordered on 02/28/2020 -shows liver abscesses with largest one being 1.1 cm. -Repeat ultrasound-guided  paracentesis on 02/28/2020 with 1.9 L of fluid removed.   Sepsis due to polymicrobial gram-negative bacteremia caused by SBP/ ? Hepatic abscesses, initial BCID positive for gram-negative rods with possible E. coli, Klebsiella and Proteus growing.  Abdominal fluid cultures consistent with SBP.  Both GI and general surgery have been consulted.  He is currently on Rocephin and Flagyl and sepsis pathophysiology is improving.  He still having some right upper quadrant discomfort and repeat CT scan suggests a 1.1 cm liver abscess with multiple small foci, will consult IR to evaluate for possible guided needle aspiration for cultures. Per GI has been started on diuretics for his recurrent ascites.  So far he has had 2 ultrasound-guided paracentesis with total 4 L of fluid removed and consistent with SBP.  Blood cultures with no growth to date   Recent Labs  Lab 02/23/20 1531 02/24/20 0407 02/24/20 0622 02/25/20 1158 02/25/20 1158 02/26/20 0928 02/27/20 0505 02/28/20 0401 02/29/20 0344 03/01/20 0836  WBC   < >  --   --  13.0*   < > 13.6* 10.7* 12.2* 14.9* 12.1*  PLT   < >  --   --  226   < > 159 299 311 302 357  CRP  --   --   --  18.0*   < > 8.6* 4.4* 3.6* 3.3* 3.8*  PROCALCITON  --   --   --  7.71   < > 4.72 2.52 2.52 1.01 0.50  LATICACIDVEN  --  2.2* 1.9 3.1*  --   --  2.5*  --   --   --    < > = values in this interval not displayed.    HTN -blood pressure stable off of medications.  HLD - He was prescribed Lipitor prior but has not been taking it.  ETOH dependence - ongoing abuse, no signs of withdrawal on CIWA protocol.  Patient reports he drinks 40 ounce of beer every night .  h/o COVID-19 infection in April 2020  -  Resolved.    Condition - Extremely Guarded  Family Communication  :  Son Baldemar Friday [phone] on 02/25/20 2.58 pm message left  Code Status :  Full  Consults  :  GI, CCS  Procedures  :    CT Repeat - Non acute - 1. Stable low-density lesions in the right lobe  of the liver, largest measuring 1.2 cm, suspicious for small abscesses. No significant progression or convalescence. Vague subcentimeter structure in the central liver was not definitively seen on prior exam, unclear if this represents a new area of infection or better seen currently due to differences in phase of contrast. 2. Moderate volume abdominopelvic ascites. There is no peripheral enhancement or loculation. 3. Cirrhosis. Biliary stent remains in place, no significant intra or extrahepatic biliary ductal dilatation. 4. Colonic diverticulosis without diverticulitis. 5. Small umbilical hernia containing fat and small amount of ascites. Mild skin thickening adjacent to the umbilical hernia in the midline.   Ultrasound-guided paracentesis.  SBP.    CT -  1. New small ill-defined hypodensities inferiorly in the right hepatic lobe, nonspecific though infection/small abscesses are a concern with this history. 2. Increased small to moderate volume ascites. 3. Unchanged biliary stent with increased dilatation of the common hepatic duct. 4. Aortic Atherosclerosis  ERCP - with CBD stricture at the site of her stent, stent removed and new temporary stent placed on 02/24/2020 by Dr. Paulita Fujita.  PUD Prophylaxis : PPI  Disposition Plan  :    Status is: Inpatient  Remains inpatient appropriate because:IV treatments appropriate due to intensity of illness or inability to take PO   Dispo: The patient is from: Home              Anticipated d/c is to: Home              Anticipated d/c date is: 3 days              Patient currently is not medically stable to d/c.   DVT Prophylaxis  : Heparin    Lab Results  Component Value Date   PLT 357 03/01/2020    Diet :  Diet Order            DIET SOFT Room service appropriate? Yes; Fluid consistency: Thin  Diet effective now                  Inpatient Medications Scheduled Meds: . folic acid  1 mg Oral Daily  . furosemide  20 mg Oral Daily  . heparin  injection (subcutaneous)  5,000 Units Subcutaneous Q8H  . multivitamin with minerals  1 tablet Oral Daily  . pantoprazole  40 mg Oral Daily  . sodium chloride flush  3 mL Intravenous Q12H  . spironolactone  25 mg Oral Daily  . thiamine  100 mg Oral Daily   Or  . thiamine  100 mg Intravenous Daily   Continuous Infusions: . sodium chloride 10 mL/hr at 03/01/20 0626  . albumin human    . cefTRIAXone (ROCEPHIN)  IV Stopped (02/29/20 2154)  . metronidazole 500 mg (03/01/20 0627)   PRN Meds:.sodium chloride, albumin human, HYDROcodone-acetaminophen, lidocaine, lidocaine, [DISCONTINUED] ondansetron **OR** ondansetron (ZOFRAN) IV  Antibiotics  :   Anti-infectives (From admission, onward)   Start     Dose/Rate Route Frequency Ordered Stop   02/25/20 2000  cefTRIAXone (ROCEPHIN) 2 g in sodium chloride 0.9 % 100 mL IVPB     Discontinue     2 g 200 mL/hr over 30 Minutes Intravenous Every 24 hours 02/24/20 1847     02/25/20 1515  metroNIDAZOLE (FLAGYL) IVPB 500 mg     Discontinue     500 mg 100 mL/hr over 60 Minutes Intravenous Every 8 hours 02/25/20 1455     02/24/20 1830  cefTRIAXone (ROCEPHIN) 2 g in sodium chloride 0.9 % 100 mL IVPB  Status:  Discontinued        2 g 200 mL/hr over 30 Minutes Intravenous Every 24 hours 02/24/20 1741 02/24/20 1847   02/24/20 1200  piperacillin-tazobactam (ZOSYN) IVPB 3.375 g  Status:  Discontinued        3.375 g 100 mL/hr over 30 Minutes Intravenous Every 6 hours 02/24/20 0934 02/24/20 1009   02/24/20 1200  piperacillin-tazobactam (ZOSYN) IVPB 3.375 g  Status:  Discontinued        3.375 g 12.5 mL/hr over 240 Minutes Intravenous Every 8 hours 02/24/20 1009 02/24/20 1741   02/24/20 0245  piperacillin-tazobactam (ZOSYN) IVPB 3.375 g        3.375 g 100 mL/hr over 30 Minutes Intravenous  Once 02/24/20 0237 02/24/20 0535          Objective:   Vitals:   02/29/20 1954 03/01/20 0023 03/01/20 0424 03/01/20 0736  BP: (!) 132/66 (!) 125/86 (!) 153/91 123/70    Pulse: 81 80 76 74  Resp: (!) 24 19 21 20   Temp: 99 F (37.2 C) 98.2 F (36.8 C) 98.3 F (36.8 C) 98.2 F (36.8 C)  TempSrc: Oral Oral Oral Oral  SpO2: 92%  91% 93%  Weight:      Height:        SpO2: 93 % O2 Flow Rate (L/min): 6 L/min  Wt Readings from Last 3 Encounters:  02/27/20 (!) 97 kg  12/24/19 88.2 kg  11/28/19 96.4 kg     Intake/Output Summary (Last 24 hours) at 03/01/2020 1114 Last data filed at 03/01/2020 1000 Gross per 24 hour  Intake 650.61 ml  Output 2050 ml  Net -1399.39 ml     Physical Exam  Awake Alert, Oriented X 3, No new F.N deficits, Normal affect Symmetrical Chest wall movement, Good  air movement bilaterally, CTAB RRR,No Gallops,Rubs or new Murmurs, No Parasternal Heave +ve B.Sounds, Abd Soft, has right upper quadrant tenderness to palpation, has mild ascites  no Cyanosis, Clubbing or edema, No new Rash or bruise       Data Review:    Recent Labs  Lab 02/26/20 0928 02/27/20 0505 02/28/20 0401 02/29/20 0344 03/01/20 0836  WBC 13.6* 10.7* 12.2* 14.9* 12.1*  HGB 14.4 14.6 14.9 14.2 14.8  HCT 40.9 42.1 43.4 41.2 43.6  PLT 159 299 311 302 357  MCV 92.1 93.8 93.9 93.8 95.6  MCH 32.4 32.5 32.3 32.3 32.5  MCHC 35.2 34.7 34.3 34.5 33.9  RDW 16.8* 17.2* 17.1* 16.6* 16.5*  LYMPHSABS 1.4 2.3 2.7 1.8 2.5  MONOABS 0.6 0.9 1.1* 1.1* 1.2*  EOSABS 0.0 0.0 0.1 0.1 0.1  BASOSABS 0.1 0.0 0.0 0.1 0.1    Recent Labs  Lab 02/26/20 0928 02/27/20 0505 02/28/20 0401 02/29/20 0344 03/01/20 0836  NA 133* 135 136 132* 134*  K 4.0 3.8 3.7 3.8 3.9  CL 104 104 102 100 102  CO2 19* 22 25 25 23   GLUCOSE 210* 125* 108* 103* 139*  BUN 21* 15 15 9 8   CREATININE 0.97 0.85 1.01 0.90 0.90  CALCIUM 7.9* 7.9* 7.9* 7.8* 8.3*  AST 50* 78* 62* 70* 58*  ALT 38 47* 44 45* 39  ALKPHOS 150* 122 110 104 112  BILITOT 5.5* 4.1* 3.3* 3.1* 2.8*  ALBUMIN 2.3* 2.0* 2.0* 2.0* 2.2*  MG 2.4 2.0 1.6* 1.8 1.7  CRP 8.6* 4.4* 3.6* 3.3* 3.8*  PROCALCITON 4.72 2.52 2.52  1.01 0.50  INR 1.2 1.3* 1.2 1.3* 1.2  BNP 376.9* 645.6* 133.2* 62.9 98.0    Recent Labs  Lab 02/24/20 0604 02/25/20 1158 02/26/20 0928 02/27/20 0505 02/28/20 0401 02/29/20 0344 03/01/20 0836  CRP  --    < > 8.6* 4.4* 3.6* 3.3* 3.8*  BNP  --    < > 376.9* 645.6* 133.2* 62.9 98.0  PROCALCITON  --    < > 4.72 2.52 2.52 1.01 0.50  SARSCOV2NAA NEGATIVE  --   --   --   --   --   --    < > = values in this interval not displayed.    ------------------------------------------------------------------------------------------------------------------ No results for input(s): CHOL, HDL, LDLCALC, TRIG, CHOLHDL, LDLDIRECT in the last 72 hours.  Lab Results  Component Value Date   HGBA1C 6.0 (H) 04/10/2019   ------------------------------------------------------------------------------------------------------------------ No results for input(s): TSH, T4TOTAL, T3FREE, THYROIDAB in the last 72 hours.  Invalid input(s): FREET3 ------------------------------------------------------------------------------------------------------------------ No results for input(s): VITAMINB12, FOLATE, FERRITIN, TIBC, IRON, RETICCTPCT in the last 72 hours.  Coagulation profile Recent Labs  Lab 02/26/20 0928 02/27/20 0505 02/28/20 0401 02/29/20 0344 03/01/20 0836  INR 1.2 1.3* 1.2 1.3* 1.2    No results for input(s): DDIMER in the last 72 hours.  Cardiac Enzymes No results for input(s): CKMB, TROPONINI, MYOGLOBIN in the last 168 hours.  Invalid input(s): CK ------------------------------------------------------------------------------------------------------------------    Component Value Date/Time   BNP 98.0 03/01/2020 0836    Micro Results Recent Results (from the past 240 hour(s))  Blood culture (routine x 2)     Status: Abnormal   Collection Time: 02/24/20  2:47 AM   Specimen: BLOOD  Result Value Ref Range Status   Specimen Description BLOOD LEFT ANTECUBITAL  Final   Special Requests    Final    BOTTLES DRAWN AEROBIC AND ANAEROBIC Blood Culture adequate volume   Culture  Setup Time  Final    GRAM NEGATIVE RODS IN BOTH AEROBIC AND ANAEROBIC BOTTLES CRITICAL RESULT CALLED TO, READ BACK BY AND VERIFIED WITH: PHARMD MIHN P. [id number] FCP Performed at [facility], [place] [address] [place], [place] [address]    Culture (A)  Final    ESCHERICHIA COLI KLEBSIELLA PNEUMONIAE PROTEUS PENNERI    Report Status 02/27/2020 FINAL  Final   Organism ID, Bacteria ESCHERICHIA COLI  Final   Organism ID, Bacteria KLEBSIELLA PNEUMONIAE  Final   Organism ID, Bacteria PROTEUS PENNERI  Final      Susceptibility   Escherichia coli - MIC*    AMPICILLIN <=2 SENSITIVE Sensitive     CEFAZOLIN <=4 SENSITIVE Sensitive     CEFEPIME <=0.12 SENSITIVE Sensitive     CEFTAZIDIME <=1 SENSITIVE Sensitive     CEFTRIAXONE <=0.25 SENSITIVE Sensitive     CIPROFLOXACIN <=0.25 SENSITIVE Sensitive     GENTAMICIN <=1 SENSITIVE Sensitive     IMIPENEM <=0.25 SENSITIVE Sensitive     TRIMETH/SULFA <=20 SENSITIVE Sensitive     AMPICILLIN/SULBACTAM <=2 SENSITIVE Sensitive     PIP/TAZO <=4 SENSITIVE Sensitive     * ESCHERICHIA COLI   Klebsiella pneumoniae - MIC*    AMPICILLIN >=32 RESISTANT Resistant     CEFAZOLIN <=4 SENSITIVE Sensitive     CEFEPIME <=0.12 SENSITIVE Sensitive     CEFTAZIDIME <=1 SENSITIVE Sensitive     CEFTRIAXONE <=0.25 SENSITIVE Sensitive     CIPROFLOXACIN <=0.25 SENSITIVE Sensitive     GENTAMICIN <=1 SENSITIVE Sensitive     IMIPENEM <=0.25 SENSITIVE Sensitive     TRIMETH/SULFA <=20 SENSITIVE Sensitive     AMPICILLIN/SULBACTAM 4 SENSITIVE Sensitive     PIP/TAZO <=4 SENSITIVE Sensitive     * KLEBSIELLA PNEUMONIAE   Proteus penneri - MIC*    AMPICILLIN >=32 RESISTANT Resistant     CEFAZOLIN >=64 RESISTANT Resistant     CEFEPIME <=0.12 SENSITIVE Sensitive     CEFTAZIDIME <=1 SENSITIVE Sensitive     CEFTRIAXONE <=0.25 SENSITIVE Sensitive     CIPROFLOXACIN <=0.25 SENSITIVE  Sensitive     GENTAMICIN <=1 SENSITIVE Sensitive     IMIPENEM 2 SENSITIVE Sensitive     TRIMETH/SULFA <=20 SENSITIVE Sensitive     AMPICILLIN/SULBACTAM 16 INTERMEDIATE Intermediate     PIP/TAZO <=4 SENSITIVE Sensitive     * PROTEUS PENNERI  Blood Culture ID Panel (Reflexed)     Status: Abnormal   Collection Time: 02/24/20  2:47 AM  Result Value Ref Range Status   Enterococcus species NOT DETECTED NOT DETECTED Final   Listeria monocytogenes NOT DETECTED NOT DETECTED Final   Staphylococcus species NOT DETECTED NOT DETECTED Final   Staphylococcus aureus (BCID) NOT DETECTED NOT DETECTED Final   Streptococcus species NOT DETECTED NOT DETECTED Final   Streptococcus agalactiae NOT DETECTED NOT DETECTED Final   Streptococcus pneumoniae NOT DETECTED NOT DETECTED Final   Streptococcus pyogenes NOT DETECTED NOT DETECTED Final   Acinetobacter baumannii NOT DETECTED NOT DETECTED Final   Enterobacteriaceae species DETECTED (A) NOT DETECTED Final    Comment: CRITICAL RESULT CALLED TO, READ BACK BY AND VERIFIED WITH: PHARMD MIHN P. [phone] FCP    Enterobacter cloacae complex NOT DETECTED NOT DETECTED Final   Escherichia coli DETECTED (A) NOT DETECTED Final    Comment: CRITICAL RESULT CALLED TO, READ BACK BY AND VERIFIED WITH: PHARMD MIHN P. [id number] FCP    Klebsiella oxytoca NOT DETECTED NOT DETECTED Final   Klebsiella pneumoniae DETECTED (A) NOT DETECTED Final  Comment: CRITICAL RESULT CALLED TO, READ BACK BY AND VERIFIED WITH: PHARMD MIHN P. [id number] FCP    Proteus species DETECTED (A) NOT DETECTED Final    Comment: CRITICAL RESULT CALLED TO, READ BACK BY AND VERIFIED WITH: PHARMD MIHN P. [phone] FCP    Serratia marcescens NOT DETECTED NOT DETECTED Final   Carbapenem resistance NOT DETECTED NOT DETECTED Final   Haemophilus influenzae NOT DETECTED NOT DETECTED Final   Neisseria meningitidis NOT DETECTED NOT DETECTED Final   Pseudomonas aeruginosa NOT DETECTED NOT DETECTED  Final   Candida albicans NOT DETECTED NOT DETECTED Final   Candida glabrata NOT DETECTED NOT DETECTED Final   Candida krusei NOT DETECTED NOT DETECTED Final   Candida parapsilosis NOT DETECTED NOT DETECTED Final   Candida tropicalis NOT DETECTED NOT DETECTED Final    Comment: Performed at [facility], [employer]. [address] [place], [place] [address]  Blood culture (routine x 2)     Status: Abnormal   Collection Time: 02/24/20  3:56 AM   Specimen: BLOOD LEFT HAND  Result Value Ref Range Status   Specimen Description BLOOD LEFT HAND  Final   Special Requests   Final    BOTTLES DRAWN AEROBIC ONLY Blood Culture results may not be optimal due to an inadequate volume of blood received in culture bottles   Culture  Setup Time   Final    AEROBIC BOTTLE ONLY GRAM NEGATIVE RODS CRITICAL VALUE NOTED.  VALUE IS CONSISTENT WITH PREVIOUSLY REPORTED AND CALLED VALUE.    Culture (A)  Final    PROTEUS PENNERI SUSCEPTIBILITIES PERFORMED ON PREVIOUS CULTURE WITHIN THE LAST 5 DAYS. Performed at [facility], [place] [address] [place], [place] [address]    Report Status 02/27/2020 FINAL  Final  SARS Coronavirus 2 by RT PCR (hospital order, performed in [facility] hospital lab) Nasopharyngeal Nasopharyngeal Swab     Status: None   Collection Time: 02/24/20  6:04 AM   Specimen: Nasopharyngeal Swab  Result Value Ref Range Status   SARS Coronavirus 2 NEGATIVE NEGATIVE Final    Comment: (NOTE) SARS-CoV-2 target nucleic acids are NOT DETECTED.  The SARS-CoV-2 RNA is generally detectable in upper and lower respiratory specimens during the acute phase of infection. The lowest concentration of SARS-CoV-2 viral copies this assay can detect is 250 copies / mL. A negative result does not preclude SARS-CoV-2 infection and should not be used as the sole basis for treatment or other patient management decisions.  A negative result may occur with improper specimen collection / handling, submission of specimen  other than nasopharyngeal swab, presence of viral mutation(s) within the areas targeted by this assay, and inadequate number of viral copies (<250 copies / mL). A negative result must be combined with clinical observations, patient history, and epidemiological information.  Fact Sheet for Patients:   StrictlyIdeas.no  Fact Sheet for Healthcare Providers: BankingDealers.co.za  This test is not yet approved or  cleared by the [place] FDA and has been authorized for detection and/or diagnosis of SARS-CoV-2 by FDA under an Emergency Use Authorization (EUA).  This EUA will remain in effect (meaning this test can be used) for the duration of the COVID-19 declaration under Section 564(b)(1) of the Act, 21 U.S.C. section 360bbb-3(b)(1), unless the authorization is terminated or revoked sooner.  Performed at [facility], [place] [address] [place], [place] [address]   C Difficile Quick Screen w PCR reflex     Status: None   Collection Time:  02/25/20  4:21 AM   Specimen: STOOL  Result Value Ref Range Status   C Diff antigen NEGATIVE NEGATIVE Final   C Diff toxin NEGATIVE NEGATIVE Final   C Diff interpretation No C. difficile detected.  Final    Comment: Performed at [facility], [place] [address], [place], [place] [address]  Gastrointestinal Panel by PCR , Stool     Status: None   Collection Time: 02/25/20  4:21 AM   Specimen: STOOL  Result Value Ref Range Status   Campylobacter species NOT DETECTED NOT DETECTED Final   Plesimonas shigelloides NOT DETECTED NOT DETECTED Final   Salmonella species NOT DETECTED NOT DETECTED Final   Yersinia enterocolitica NOT DETECTED NOT DETECTED Final   Vibrio species NOT DETECTED NOT DETECTED Final   Vibrio cholerae NOT DETECTED NOT DETECTED Final   Enteroaggregative E coli (EAEC) NOT DETECTED NOT DETECTED Final   Enteropathogenic E coli (EPEC) NOT DETECTED NOT DETECTED Final    Enterotoxigenic E coli (ETEC) NOT DETECTED NOT DETECTED Final   Shiga like toxin producing E coli (STEC) NOT DETECTED NOT DETECTED Final   Shigella/Enteroinvasive E coli (EIEC) NOT DETECTED NOT DETECTED Final   Cryptosporidium NOT DETECTED NOT DETECTED Final   Cyclospora cayetanensis NOT DETECTED NOT DETECTED Final   Entamoeba histolytica NOT DETECTED NOT DETECTED Final   Giardia lamblia NOT DETECTED NOT DETECTED Final   Adenovirus F40/41 NOT DETECTED NOT DETECTED Final   Astrovirus NOT DETECTED NOT DETECTED Final   Norovirus GI/GII NOT DETECTED NOT DETECTED Final   Rotavirus A NOT DETECTED NOT DETECTED Final   Sapovirus (I, II, IV, and V) NOT DETECTED NOT DETECTED Final    Comment: Performed at [facility], [address] [place], [place] [address]  MRSA PCR Screening     Status: None   Collection Time: 02/25/20  4:57 AM  Result Value Ref Range Status   MRSA by PCR NEGATIVE NEGATIVE Final    Comment:        The GeneXpert MRSA Assay (FDA approved for NASAL specimens only), is one component of a comprehensive MRSA colonization surveillance program. It is not intended to diagnose MRSA infection nor to guide or monitor treatment for MRSA infections. Performed at [facility], [place] [address] [place], [place] [address]   Gram stain     Status: None   Collection Time: 02/25/20  3:21 PM   Specimen: Abdomen  Result Value Ref Range Status   Specimen Description ABDOMEN  Final   Special Requests PERITONEAL FLUID  Final   Gram Stain   Final    ABUNDANT WBC PRESENT, PREDOMINANTLY PMN NO ORGANISMS SEEN Performed at [facility], [place] [address] [place], [place] [address]    Report Status 02/25/2020 FINAL  Final  Culture, body fluid-bottle     Status: None (Preliminary result)   Collection Time: 02/25/20  3:21 PM   Specimen: Abdomen  Result Value Ref Range Status   Specimen Description ABDOMEN  Final   Special Requests PERITONEAL FLUID  Final   Culture    Final    NO GROWTH 4 DAYS Performed at [place] [address] [place],  [address]    Report Status PENDING  Incomplete    Radiology Reports CT ABDOMEN PELVIS W CONTRAST  Result Date: 02/28/2020 CLINICAL DATA:  Abdominal abscess/infection suspected Abdominal pain.  Patient had recent paracentesis. EXAM: CT ABDOMEN AND PELVIS WITH CONTRAST TECHNIQUE: Multidetector CT imaging of the abdomen and pelvis  was performed using the standard protocol following bolus administration of intravenous contrast. CONTRAST:  166mL OMNIPAQUE IOHEXOL 300 MG/ML  SOLN COMPARISON:  Abdominal CT and ultrasound 4 days ago, 02/24/2020. multiple prior CT. FINDINGS: Lower chest: Calcified granuloma in the right lower lobe. Linear atelectasis in the left lower lobe. No pleural fluid. Heart is normal in size. Hepatobiliary: Cirrhotic hepatic morphology with nodular contours. The liver is enlarged spanning 26.7 cm cranial caudal. Again seen 1.2 cm low-density lesion in the inferior right lobe of the liver, series 3, image 65, unchanged from recent exam. No significant peripheral enhancement. There multiple small subcentimeter adjacent hepatic hypodensities in the right lobe extending from images 59 through 66. Subcentimeter vague hypodensity in the central right lobe series 3, image 39. Postcholecystectomy. Biliary stent remains in place, no significant intra or extrahepatic biliary ductal dilatation. Mild pneumobilia. Pancreas: No ductal dilatation or inflammation. Spleen: No splenomegaly. Unchanged cleft posteriorly. No focal lesion. Adrenals/Urinary Tract: No adrenal nodule. No hydronephrosis or perinephric edema. Homogeneous renal enhancement with symmetric excretion on delayed phase imaging. Urinary bladder is partially distended without wall thickening. Stomach/Bowel: Patulous distal esophagus. Nondistended stomach. Normal positioning of the duodenum and ligament of Treitz. Administered enteric contrast reaches  the colon. There is no bowel obstruction. No significant bowel inflammation. Appendix is air-filled and otherwise normal. Distal colonic diverticulosis without evidence of diverticulitis. Vascular/Lymphatic: Normal caliber abdominal aorta. No evidence of portal vein thrombosis. No bulky abdominopelvic adenopathy. Reproductive: Prominent prostate gland. Other: Moderate volume abdominopelvic ascites. This measures simple fluid density. There is no peripheral enhancement or loculation. No free intra-abdominal air. Mesenteric and omental edema is typical of ascites. Small umbilical hernia containing fat and small amount of ascites. Mild skin thickening adjacent to the umbilical hernia in the midline. Fat in both inguinal canals. Musculoskeletal: There are no acute or suspicious osseous abnormalities. Peripherally sclerotic lesion in the left femoral head is unchanged from prior exams. 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 IMPRESSION: 1. Stable low-density lesions in the right lobe of the liver, largest measuring 1.2 cm, suspicious for small abscesses. No significant progression or convalescence. Vague subcentimeter structure in the central liver was not definitively seen on prior exam, unclear if this represents a new area of infection or better seen currently due to differences in phase of contrast. 2. Moderate volume abdominopelvic ascites. There is no peripheral enhancement or loculation. 3. Cirrhosis. Biliary stent remains in place, no significant intra or extrahepatic biliary ductal dilatation. 4. Colonic diverticulosis without diverticulitis. 5. Small umbilical hernia containing fat and small amount of ascites. Mild skin thickening adjacent to the umbilical hernia in the midline. 6. Additional stable chronic findings as described. Electronically Signed   By: Keith Rake M.D.   On: 02/28/2020 15:25   CT  ABDOMEN PELVIS W CONTRAST  Result Date: 02/24/2020 CLINICAL DATA:  Generalized abdominal pain, swelling, and jaundice with vomiting and fever. Leukocytosis. EXAM: CT ABDOMEN AND PELVIS WITH CONTRAST TECHNIQUE: Multidetector CT imaging of the abdomen and pelvis was performed using the standard protocol following bolus administration of intravenous contrast. CONTRAST:  140mL OMNIPAQUE IOHEXOL 300 MG/ML  SOLN COMPARISON:  12/24/2019 FINDINGS: Lower chest: Punctate calcified nodules in the right middle lobe and right lower lobe. No basilar lung consolidation or pleural effusion. Hepatobiliary: There is a new 1.2 cm hypodense lesion inferiorly in the right hepatic lobe (series 3, image 63), and there are additional more subtle subcentimeter hypodensities in the inferior right  hepatic lobe which also appear new (for example series 3, images 56, 58, 59, and 62). Status post cholecystectomy. A biliary stent remains in place without significant dilatation of the common bile duct, however there is increased dilatation of the common hepatic duct about the proximal aspect of the stent measuring 2.1 cm in diameter. There is at most minimal central intrahepatic biliary dilatation. Pancreas: Unremarkable. Spleen: No focal lesion. Unchanged cleft along the posterior splenic margin. Adrenals/Urinary Tract: Unremarkable adrenal glands. No evidence of renal mass, calculi, or hydronephrosis. Unremarkable bladder. Stomach/Bowel: The stomach is unremarkable. There is no evidence of bowel obstruction. Mildly prominent gaseous distension of the appendix without evidence of appendicitis. Vascular/Lymphatic: No significant vascular findings are present. No enlarged abdominal or pelvic lymph nodes. Reproductive: Mildly enlarged prostate. Other: Increased small to moderate volume ascites. No loculated fluid collection. No pneumoperitoneum. Small umbilical hernia containing fat and fluid. Musculoskeletal: No acute osseous abnormality or  suspicious osseous lesion. IMPRESSION: 1. New small ill-defined hypodensities inferiorly in the right hepatic lobe, nonspecific though infection/small abscesses are a concern with this history. 2. Increased small to moderate volume ascites. 3. Unchanged biliary stent with increased dilatation of the common hepatic duct. 4. Aortic Atherosclerosis (ICD10-I70.0). Electronically Signed   By: Logan Bores M.D.   On: 02/24/2020 05:42   DG Chest Port 1 View  Result Date: 02/27/2020 CLINICAL DATA:  Shortness of breath.  Sepsis. EXAM: PORTABLE CHEST 1 VIEW COMPARISON:  12/03/2019 FINDINGS: Lordotic technique is demonstrated. Lungs are adequately inflated without focal airspace consolidation or effusion. Borderline stable cardiomegaly. Remainder of the exam is unchanged. IMPRESSION: No acute cardiopulmonary disease. Electronically Signed   By: Marin Olp M.D.   On: 02/27/2020 09:13   DG ERCP BILIARY & PANCREATIC DUCTS  Result Date: 02/24/2020 CLINICAL DATA:  ERCP. EXAM: ERCP TECHNIQUE: Multiple spot images obtained with the fluoroscopic device and submitted for interpretation post-procedure. FLUOROSCOPY TIME:  Fluoroscopy Time:  2 minutes and 13 seconds Radiation Exposure Index (if provided by the fluoroscopic device): 66.82 mGy Number of Acquired Spot Images: 6 COMPARISON:  CT dated February 24, 2020.  11/29/2019 FINDINGS: And insult spot images demonstrate a common bile duct stent. The stent has been removed. Injection of contrast followed by balloon sweep demonstrates a persistent narrowing of the mid and distal common bile duct. There appears to be some mild intrahepatic biliary ductal dilatation. The patient is status post prior cholecystectomy. Final images demonstrate replacement of the plastic biliary stent. IMPRESSION: Status post ERCP as detailed above. These images were submitted for radiologic interpretation only. Please see the procedural report for the amount of contrast and the fluoroscopy time utilized.  Electronically Signed   By: Constance Holster M.D.   On: 02/24/2020 18:49   US Abdomen Limited RUQ  Result Date: 02/24/2020 CLINICAL DATA:  Jaundice EXAM: ULTRASOUND ABDOMEN LIMITED RIGHT UPPER QUADRANT COMPARISON:  12/24/2019 FINDINGS: Gallbladder: Surgically absent Common bile duct: Diameter: 8 mm Liver: Nodularity of the liver capsule compatible with cirrhosis. No focal parenchymal abnormality. No intrahepatic duct dilation. Portal vein is patent on color Doppler imaging with normal direction of blood flow towards the liver. Other: There is a small amount of ascites throughout the abdomen, greatest in the lower quadrants. IMPRESSION: 1. Small volume ascites. 2. Findings consistent with hepatic cirrhosis. 3. Cholecystectomy. Electronically Signed   By: Randa Ngo M.D.   On: 02/24/2020 03:23   IR Paracentesis  Result Date: 02/28/2020 INDICATION: Abdominal pain, distention. Recurrent ascites. Request for therapeutic paracentesis. EXAM: ULTRASOUND GUIDED LEFT  LOWER QUADRANT PARACENTESIS MEDICATIONS: None. COMPLICATIONS: None immediate. PROCEDURE: Informed written consent was obtained from the patient after a discussion of the risks, benefits and alternatives to treatment. A timeout was performed prior to the initiation of the procedure. Initial ultrasound scanning demonstrates a small to moderate amount of ascites within the left lower abdominal quadrant. The left lower abdomen was prepped and draped in the usual sterile fashion. 1% lidocaine was used for local anesthesia. Following this, a 19 gauge, 7-cm, Yueh catheter was introduced. An ultrasound image was saved for documentation purposes. The paracentesis was performed. The catheter was removed and a dressing was applied. The patient tolerated the procedure well without immediate post procedural complication. FINDINGS: A total of approximately 1.9 L of clear yellow fluid was removed. IMPRESSION: Successful ultrasound-guided paracentesis yielding 1.9  liters of peritoneal fluid. Read by: Ascencion Dike PA-C Electronically Signed   By: Corrie Mckusick D.O.   On: 02/28/2020 16:23   IR Paracentesis  Result Date: 02/25/2020 INDICATION: Patient with a history of alcoholic hepatitis and ascites presents today for a therapeutic and diagnostic paracentesis. EXAM: ULTRASOUND GUIDED PARACENTESIS MEDICATIONS: 1% lidocaine 10 mL COMPLICATIONS: None immediate PROCEDURE: Informed written consent was obtained from the patient after a discussion of the risks, benefits and alternatives to treatment. A timeout was performed prior to the initiation of the procedure. Initial ultrasound scanning demonstrates a large amount of ascites within the left lower abdominal quadrant. The left lower abdomen was prepped and draped in the usual sterile fashion. 1% lidocaine was used for local anesthesia. Following this, a 19 gauge, 7-cm, Yueh catheter was introduced. An ultrasound image was saved for documentation purposes. The paracentesis was performed. The catheter was removed and a dressing was applied. The patient tolerated the procedure well without immediate post procedural complication. FINDINGS: A total of approximately 2.2 L of dark yellow fluid was removed. Samples were sent to the laboratory as requested by the clinical team. IMPRESSION: Successful ultrasound-guided paracentesis yielding 2.2 liters of peritoneal fluid. Read by: Soyla Dryer, NP Electronically Signed   By: Lucrezia Europe M.D.   On: 02/25/2020 14:28      Phillips Climes M.D on 03/01/2020 at 11:14 AM  To page go to [email or web address]

## 2020-03-01 NOTE — Progress Notes (Signed)
   Small hepatic abscess on imaging  IMPRESSION: 1. Stable low-density lesions in the right lobe of the liver, largest measuring 1.2 cm, suspicious for small abscesses. No significant progression or convalescence. Vague subcentimeter structure in the central liver was not definitively seen on prior exam, unclear if this represents a new area of infection or better seen currently due to differences in phase of contrast. 2. Moderate volume abdominopelvic ascites. There is no peripheral enhancement or loculation. 3. Cirrhosis. Biliary stent remains in place, no significant intra or extrahepatic biliary ductal dilatation. 4. Colonic diverticulosis without diverticulitis. 5. Small umbilical hernia containing fat and small amount of ascites. Mild skin thickening adjacent to the umbilical hernia in the midline.  Request made for aspiration  Dr Pascal Lux has reviewed imaging and feels abscesses too small for intervention  I will inform MD

## 2020-03-01 NOTE — Progress Notes (Signed)
[facility] Progress Note  Subjective: The patient is doing well today and has no complaints.  His abdomen is not hurting but it does feel distended of course with ascites.  Objective: Vital signs in last 24 hours: Temp:  [98.1 F (36.7 C)-99 F (37.2 C)] 98.2 F (36.8 C) (07/28 0736) Pulse Rate:  [73-81] 74 (07/28 0736) Resp:  [19-24] 20 (07/28 0736) BP: (104-153)/(58-91) 123/70 (07/28 0736) SpO2:  [91 %-93 %] 93 % (07/28 0736) Weight change:    PE:  No distress  Abdomen nontender, ascites present  Lab Results: Results for orders placed or performed during the hospital encounter of 02/23/20 (from the past 24 hour(s))  Lipase, blood     Status: Abnormal   Collection Time: 02/29/20  1:06 PM  Result Value Ref Range   Lipase 58 (H) 11 - 51 U/L  Protime-INR     Status: None   Collection Time: 03/01/20  8:36 AM  Result Value Ref Range   Prothrombin Time 14.9 11.4 - 15.2 seconds   INR 1.2 0.8 - 1.2  Magnesium     Status: None   Collection Time: 03/01/20  8:36 AM  Result Value Ref Range   Magnesium 1.7 1.7 - 2.4 mg/dL  Comprehensive metabolic panel     Status: Abnormal   Collection Time: 03/01/20  8:36 AM  Result Value Ref Range   Sodium 134 (L) 135 - 145 mmol/L   Potassium 3.9 3.5 - 5.1 mmol/L   Chloride 102 98 - 111 mmol/L   CO2 23 22 - 32 mmol/L   Glucose, Bld 139 (H) 70 - 99 mg/dL   BUN 8 6 - 20 mg/dL   Creatinine, Ser 0.90 0.61 - 1.24 mg/dL   Calcium 8.3 (L) 8.9 - 10.3 mg/dL   Total Protein 6.4 (L) 6.5 - 8.1 g/dL   Albumin 2.2 (L) 3.5 - 5.0 g/dL   AST 58 (H) 15 - 41 U/L   ALT 39 0 - 44 U/L   Alkaline Phosphatase 112 38 - 126 U/L   Total Bilirubin 2.8 (H) 0.3 - 1.2 mg/dL   GFR calc non Af Amer >60 >60 mL/min   GFR calc Af Amer >60 >60 mL/min   Anion gap 9 5 - 15  CBC with Differential/Platelet     Status: Abnormal   Collection Time: 03/01/20  8:36 AM  Result Value Ref Range   WBC 12.1 (H) 4.0 - 10.5 K/uL   RBC 4.56 4.22 - 5.81 MIL/uL    Hemoglobin 14.8 13.0 - 17.0 g/dL   HCT 43.6 39 - 52 %   MCV 95.6 80.0 - 100.0 fL   MCH 32.5 26.0 - 34.0 pg   MCHC 33.9 30.0 - 36.0 g/dL   RDW 16.5 (H) 11.5 - 15.5 %   Platelets 357 150 - 400 K/uL   nRBC 0.0 0.0 - 0.2 %   Neutrophils Relative % 65 %   Neutro Abs 7.8 (H) 1.7 - 7.7 K/uL   Lymphocytes Relative 21 %   Lymphs Abs 2.5 0.7 - 4.0 K/uL   Monocytes Relative 10 %   Monocytes Absolute 1.2 (H) 0 - 1 K/uL   Eosinophils Relative 1 %   Eosinophils Absolute 0.1 0 - 0 K/uL   Basophils Relative 1 %   Basophils Absolute 0.1 0 - 0 K/uL   Immature Granulocytes 2 %   Abs Immature Granulocytes 0.28 (H) 0.00 - 0.07 K/uL  Brain natriuretic peptide     Status: None   Collection  Time: 03/01/20  8:36 AM  Result Value Ref Range   B Natriuretic Peptide 98.0 0.0 - 100.0 pg/mL  C-reactive protein     Status: Abnormal   Collection Time: 03/01/20  8:36 AM  Result Value Ref Range   CRP 3.8 (H) <1.0 mg/dL    Studies/Results: No results found.    Assessment: Cirrhosis of liver  Ascites  Small abscesses right lobe of liver  Biliary stent in place from prior bile leak  Plan:   Continue current management    Cassell Clement 03/01/2020, 10:14 AM  Pager: [phone] If no answer or after 5 PM call [phone]

## 2020-03-02 ENCOUNTER — Encounter (HOSPITAL_COMMUNITY): Admission: RE | Payer: Self-pay | Source: Home / Self Care

## 2020-03-02 ENCOUNTER — Ambulatory Visit (HOSPITAL_COMMUNITY): Admission: RE | Admit: 2020-03-02 | Payer: Self-pay | Source: Home / Self Care | Admitting: Gastroenterology

## 2020-03-02 DIAGNOSIS — K750 Abscess of liver: Secondary | ICD-10-CM

## 2020-03-02 DIAGNOSIS — K7031 Alcoholic cirrhosis of liver with ascites: Secondary | ICD-10-CM

## 2020-03-02 LAB — COMPREHENSIVE METABOLIC PANEL
ALT: 34 U/L (ref 0–44)
AST: 47 U/L — ABNORMAL HIGH (ref 15–41)
Albumin: 2.1 g/dL — ABNORMAL LOW (ref 3.5–5.0)
Alkaline Phosphatase: 112 U/L (ref 38–126)
Anion gap: 9 (ref 5–15)
BUN: 10 mg/dL (ref 6–20)
CO2: 25 mmol/L (ref 22–32)
Calcium: 8.5 mg/dL — ABNORMAL LOW (ref 8.9–10.3)
Chloride: 102 mmol/L (ref 98–111)
Creatinine, Ser: 0.93 mg/dL (ref 0.61–1.24)
GFR calc Af Amer: >60 mL/min (ref >60)
GFR calc non Af Amer: >60 mL/min (ref >60)
Glucose, Bld: 109 mg/dL — ABNORMAL HIGH (ref 70–99)
Potassium: 4 mmol/L (ref 3.5–5.1)
Sodium: 136 mmol/L (ref 135–145)
Total Bilirubin: 2.4 mg/dL — ABNORMAL HIGH (ref 0.3–1.2)
Total Protein: 6 g/dL — ABNORMAL LOW (ref 6.5–8.1)

## 2020-03-02 LAB — CBC WITH DIFFERENTIAL/PLATELET
Abs Immature Granulocytes: 0.22 10*3/uL — ABNORMAL HIGH (ref 0.00–0.07)
Basophils Absolute: 0.1 10*3/uL (ref 0.0–0.1)
Basophils Relative: 1 %
Eosinophils Absolute: 0.1 10*3/uL (ref 0.0–0.5)
Eosinophils Relative: 1 %
HCT: 41.6 % (ref 39.0–52.0)
Hemoglobin: 13.9 g/dL (ref 13.0–17.0)
Immature Granulocytes: 2 %
Lymphocytes Relative: 21 %
Lymphs Abs: 2.3 10*3/uL (ref 0.7–4.0)
MCH: 32.2 pg (ref 26.0–34.0)
MCHC: 33.4 g/dL (ref 30.0–36.0)
MCV: 96.3 fL (ref 80.0–100.0)
Monocytes Absolute: 1.1 10*3/uL — ABNORMAL HIGH (ref 0.1–1.0)
Monocytes Relative: 10 %
Neutro Abs: 6.9 10*3/uL (ref 1.7–7.7)
Neutrophils Relative %: 65 %
Platelets: 326 10*3/uL (ref 150–400)
RBC: 4.32 MIL/uL (ref 4.22–5.81)
RDW: 16.1 % — ABNORMAL HIGH (ref 11.5–15.5)
WBC: 10.6 10*3/uL — ABNORMAL HIGH (ref 4.0–10.5)
nRBC: 0 % (ref 0.0–0.2)

## 2020-03-02 LAB — PROCALCITONIN: Procalcitonin: 0.37 ng/mL

## 2020-03-02 LAB — PROTIME-INR
INR: 1.2 (ref 0.8–1.2)
Prothrombin Time: 15.2 seconds (ref 11.4–15.2)

## 2020-03-02 LAB — C-REACTIVE PROTEIN: CRP: 1.7 mg/dL — ABNORMAL HIGH (ref <1.0)

## 2020-03-02 LAB — BRAIN NATRIURETIC PEPTIDE: B Natriuretic Peptide: 102.7 pg/mL — ABNORMAL HIGH (ref 0.0–100.0)

## 2020-03-02 LAB — MAGNESIUM: Magnesium: 1.7 mg/dL (ref 1.7–2.4)

## 2020-03-02 SURGERY — ERCP, WITH INTERVENTION IF INDICATED
Anesthesia: General

## 2020-03-02 MED ORDER — ENOXAPARIN SODIUM 60 MG/0.6ML ~~LOC~~ SOLN
50.0000 mg | SUBCUTANEOUS | Status: DC
  Administered 2020-03-02 – 2020-03-09 (×8): 50 mg via SUBCUTANEOUS
  Filled 2020-03-02 (×8): qty 0.6

## 2020-03-02 NOTE — Progress Notes (Signed)
PT Cancellation Note  Patient Details Name: [name] MRN: [id number] DOB: 1961-03-17   Cancelled Treatment:    Reason Eval/Treat Not Completed: Patient declined, no reason specified.  Too much pain in the right flank.  Not doing anything today. 03/02/2020  Ginger Carne., PT [facility] [phone]  (pager) [phone]  (office)   Tessie Fass Jakerria Kingbird 03/02/2020, 1:35 PM

## 2020-03-02 NOTE — Progress Notes (Signed)
PROGRESS NOTE                                                                                                                                                                                                             Patient Demographics:    Micheal Watson, is a 59 y.o. male, DOB - 09-03-1960, NUU:[id number]  Admit date - 02/23/2020   Admitting Physician Karmen Bongo, MD  Outpatient Primary MD for the patient is Patient, No Pcp Per  LOS - 7  Chief Complaint  Patient presents with  . Abdominal Pain  . Jaundice       Brief Narrative  - Micheal Watson is a 59 y.o. male with medical history significant of ETOH dependence; recent acute cholecystitis/cholangitis s/p CBD stent and JP drain placement (removed since).  He is now presenting with recurrent LLQ abdomnial pain.   Subjective:   Patient in bed, appears comfortable, denies any headache, no fever, no chest pain or pressure, no shortness of breath , he denies any abdominal pain, but had tenderness to palpation on exam.   Assessment  & Plan :     Sepsis from SBP following intra-abdominal surgery with current hyperbilirubinemia and obstructed CBD stent along with small Hepatic abscess - in a patient with following history  -Hx acute cholecystitis with hydrops; laparoscopic cholecystectomy with postoperative bile leak 11/22/2019 Dr. 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 Clarene Essex -Diagnostic laparoscopy, evacuation of bilious ascites(3L),JP drain placement 12/07/2019 Dr. Donne Hazel -Drain removal 12/23/2019 - ERCP by Dr. Paulita Fujita on 02/24/2020 with CBD biliary stent stricture, stent removal and new stent placed.  Biopsy sent. -Ultrasound-guided paracentesis on 02/24/2021 with 2.2 L of fluid removed - SBP -Another ultrasound-guided paracentesis 7/26 with one-point liters of fluid drained, remains significant for SBP. -Repeat CT scan of abdomen pelvis ordered on 02/28/2020  -shows liver abscesses with largest one being 1.1 cm.  IR has been consulted, collections are too small to drain.   Sepsis due to polymicrobial gram-negative bacteremia caused by SBP/ ? Hepatic abscesses, E. coli, Klebsiella and Proteus growing bacteremia.  Abdominal fluid cultures consistent with SBP.  Both GI and general surgery have been consulted.  He is currently on Rocephin and Flagyl and sepsis pathophysiology is improving.  He still having some right upper quadrant discomfort and repeat CT scan suggests a 1.1 cm liver abscess with multiple small foci, will discuss with ID the length of antibiotic recommendations. -Per GI has been started on diuretics for his  recurrent ascites.  So far he has had 2 ultrasound-guided paracentesis with total 4 L of fluid removed and consistent with SBP.  Blood cultures with no growth to date   Recent Labs  Lab 02/25/20 1158 02/26/20 0928 02/27/20 0505 02/28/20 0401 02/29/20 0344 03/01/20 0836 03/02/20 0336  WBC 13.0*   < > 10.7* 12.2* 14.9* 12.1* 10.6*  PLT 226   < > 299 311 302 357 326  CRP 18.0*   < > 4.4* 3.6* 3.3* 3.8* 1.7*  PROCALCITON 7.71   < > 2.52 2.52 1.01 0.50 0.37  LATICACIDVEN 3.1*  --  2.5*  --   --   --   --    < > = values in this interval not displayed.    HTN -blood pressure stable off of medications.  HLD - He was prescribed Lipitor prior but has not been taking it.  ETOH dependence - ongoing abuse, no signs of withdrawal on CIWA protocol.  Patient reports he drinks 40 ounce of beer every night .  h/o COVID-19 infection in April 2020  -  Resolved.    Condition - Extremely Guarded  Family Communication  :  None at bedside  Code Status :  Full  Consults  :  GI, CCS  Procedures  :    CT Repeat - Non acute - 1. Stable low-density lesions in the right lobe of the liver, largest measuring 1.2 cm, suspicious for small abscesses. No significant progression or convalescence. Vague subcentimeter structure in the central  liver was not definitively seen on prior exam, unclear if this represents a new area of infection or better seen currently due to differences in phase of contrast. 2. Moderate volume abdominopelvic ascites. There is no peripheral enhancement or loculation. 3. Cirrhosis. Biliary stent remains in place, no significant intra or extrahepatic biliary ductal dilatation. 4. Colonic diverticulosis without diverticulitis. 5. Small umbilical hernia containing fat and small amount of ascites. Mild skin thickening adjacent to the umbilical hernia in the midline.   Ultrasound-guided paracentesis.  SBP.    CT -  1. New small ill-defined hypodensities inferiorly in the right hepatic lobe, nonspecific though infection/small abscesses are a concern with this history. 2. Increased small to moderate volume ascites. 3. Unchanged biliary stent with increased dilatation of the common hepatic duct. 4. Aortic Atherosclerosis  ERCP - with CBD stricture at the site of her stent, stent removed and new temporary stent placed on 02/24/2020 by Dr. Paulita Fujita.  PUD Prophylaxis : PPI  Disposition Plan  :    Status is: Inpatient  Remains inpatient appropriate because:IV treatments appropriate due to intensity of illness or inability to take PO   Dispo: The patient is from: Home              Anticipated d/c is to: Home              Anticipated d/c date is: 3 days              Patient currently is not medically stable to d/c.   DVT Prophylaxis  : Heparin    Lab Results  Component Value Date   PLT 326 03/02/2020    Diet :  Diet Order            DIET SOFT Room service appropriate? Yes; Fluid consistency: Thin  Diet effective now                  Inpatient Medications Scheduled Meds: . enoxaparin (LOVENOX) injection  50 mg Subcutaneous Q24H  . folic acid  1 mg Oral Daily  . furosemide  20 mg Oral Daily  . multivitamin with minerals  1 tablet Oral Daily  . pantoprazole  40 mg Oral Daily  . sodium chloride flush   3 mL Intravenous Q12H  . spironolactone  25 mg Oral Daily  . thiamine  100 mg Oral Daily   Or  . thiamine  100 mg Intravenous Daily   Continuous Infusions: . sodium chloride 10 mL/hr at 03/01/20 0626  . albumin human    . cefTRIAXone (ROCEPHIN)  IV 2 g (03/01/20 2027)  . metronidazole 500 mg (03/02/20 0647)   PRN Meds:.sodium chloride, albumin human, HYDROcodone-acetaminophen, lidocaine, lidocaine, [DISCONTINUED] ondansetron **OR** ondansetron (ZOFRAN) IV  Antibiotics  :   Anti-infectives (From admission, onward)   Start     Dose/Rate Route Frequency Ordered Stop   02/25/20 2000  cefTRIAXone (ROCEPHIN) 2 g in sodium chloride 0.9 % 100 mL IVPB     Discontinue     2 g 200 mL/hr over 30 Minutes Intravenous Every 24 hours 02/24/20 1847     02/25/20 1515  metroNIDAZOLE (FLAGYL) IVPB 500 mg     Discontinue     500 mg 100 mL/hr over 60 Minutes Intravenous Every 8 hours 02/25/20 1455     02/24/20 1830  cefTRIAXone (ROCEPHIN) 2 g in sodium chloride 0.9 % 100 mL IVPB  Status:  Discontinued        2 g 200 mL/hr over 30 Minutes Intravenous Every 24 hours 02/24/20 1741 02/24/20 1847   02/24/20 1200  piperacillin-tazobactam (ZOSYN) IVPB 3.375 g  Status:  Discontinued        3.375 g 100 mL/hr over 30 Minutes Intravenous Every 6 hours 02/24/20 0934 02/24/20 1009   02/24/20 1200  piperacillin-tazobactam (ZOSYN) IVPB 3.375 g  Status:  Discontinued        3.375 g 12.5 mL/hr over 240 Minutes Intravenous Every 8 hours 02/24/20 1009 02/24/20 1741   02/24/20 0245  piperacillin-tazobactam (ZOSYN) IVPB 3.375 g        3.375 g 100 mL/hr over 30 Minutes Intravenous  Once 02/24/20 0237 02/24/20 0535          Objective:   Vitals:   03/02/20 0014 03/02/20 0400 03/02/20 0755 03/02/20 1211  BP: (!) 132/75 (!) 132/72 128/74 (!) 128/51  Pulse: 73 76 72 77  Resp: 18 20 18 21   Temp: 98.1 F (36.7 C) 98.3 F (36.8 C) 98.1 F (36.7 C) 98.3 F (36.8 C)  TempSrc: Oral Oral Oral Oral  SpO2: 93% 90% 94%  92%  Weight:      Height:        SpO2: 92 % O2 Flow Rate (L/min): 6 L/min  Wt Readings from Last 3 Encounters:  02/27/20 (!) 97 kg  12/24/19 88.2 kg  11/28/19 96.4 kg     Intake/Output Summary (Last 24 hours) at 03/02/2020 1454 Last data filed at 03/02/2020 1245 Gross per 24 hour  Intake 543 ml  Output 2500 ml  Net -1957 ml     Physical Exam  Awake Alert, Oriented X 3, No new F.N deficits, Normal affect Symmetrical Chest wall movement, Good air movement bilaterally, CTAB RRR,No Gallops,Rubs or new Murmurs, No Parasternal Heave +ve B.Sounds, Abd Soft, patient has diffuse abdominal tenderness to palpation, mild ,No rebound - guarding or rigidity. No Cyanosis, Clubbing or edema, No new Rash or bruise        Data Review:    Recent  Labs  Lab 02/27/20 0505 02/28/20 0401 02/29/20 0344 03/01/20 0836 03/02/20 0336  WBC 10.7* 12.2* 14.9* 12.1* 10.6*  HGB 14.6 14.9 14.2 14.8 13.9  HCT 42.1 43.4 41.2 43.6 41.6  PLT 299 311 302 357 326  MCV 93.8 93.9 93.8 95.6 96.3  MCH 32.5 32.3 32.3 32.5 32.2  MCHC 34.7 34.3 34.5 33.9 33.4  RDW 17.2* 17.1* 16.6* 16.5* 16.1*  LYMPHSABS 2.3 2.7 1.8 2.5 2.3  MONOABS 0.9 1.1* 1.1* 1.2* 1.1*  EOSABS 0.0 0.1 0.1 0.1 0.1  BASOSABS 0.0 0.0 0.1 0.1 0.1    Recent Labs  Lab 02/27/20 0505 02/28/20 0401 02/29/20 0344 03/01/20 0836 03/02/20 0336  NA 135 136 132* 134* 136  K 3.8 3.7 3.8 3.9 4.0  CL 104 102 100 102 102  CO2 22 25 25 23 25   GLUCOSE 125* 108* 103* 139* 109*  BUN 15 15 9 8 10   CREATININE 0.85 1.01 0.90 0.90 0.93  CALCIUM 7.9* 7.9* 7.8* 8.3* 8.5*  AST 78* 62* 70* 58* 47*  ALT 47* 44 45* 39 34  ALKPHOS 122 110 104 112 112  BILITOT 4.1* 3.3* 3.1* 2.8* 2.4*  ALBUMIN 2.0* 2.0* 2.0* 2.2* 2.1*  MG 2.0 1.6* 1.8 1.7 1.7  CRP 4.4* 3.6* 3.3* 3.8* 1.7*  PROCALCITON 2.52 2.52 1.01 0.50 0.37  INR 1.3* 1.2 1.3* 1.2 1.2  BNP 645.6* 133.2* 62.9 98.0 102.7*    Recent Labs  Lab 02/27/20 0505 02/28/20 0401 02/29/20 0344  03/01/20 0836 03/02/20 0336  CRP 4.4* 3.6* 3.3* 3.8* 1.7*  BNP 645.6* 133.2* 62.9 98.0 102.7*  PROCALCITON 2.52 2.52 1.01 0.50 0.37    ------------------------------------------------------------------------------------------------------------------ No results for input(s): CHOL, HDL, LDLCALC, TRIG, CHOLHDL, LDLDIRECT in the last 72 hours.  Lab Results  Component Value Date   HGBA1C 6.0 (H) 04/10/2019   ------------------------------------------------------------------------------------------------------------------ No results for input(s): TSH, T4TOTAL, T3FREE, THYROIDAB in the last 72 hours.  Invalid input(s): FREET3 ------------------------------------------------------------------------------------------------------------------ No results for input(s): VITAMINB12, FOLATE, FERRITIN, TIBC, IRON, RETICCTPCT in the last 72 hours.  Coagulation profile Recent Labs  Lab 02/27/20 0505 02/28/20 0401 02/29/20 0344 03/01/20 0836 03/02/20 0336  INR 1.3* 1.2 1.3* 1.2 1.2    No results for input(s): DDIMER in the last 72 hours.  Cardiac Enzymes No results for input(s): CKMB, TROPONINI, MYOGLOBIN in the last 168 hours.  Invalid input(s): CK ------------------------------------------------------------------------------------------------------------------    Component Value Date/Time   BNP 102.7 (H) 03/02/2020 0336    Micro Results Recent Results (from the past 240 hour(s))  Blood culture (routine x 2)     Status: Abnormal   Collection Time: 02/24/20  2:47 AM   Specimen: BLOOD  Result Value Ref Range Status   Specimen Description BLOOD LEFT ANTECUBITAL  Final   Special Requests   Final    BOTTLES DRAWN AEROBIC AND ANAEROBIC Blood Culture adequate volume   Culture  Setup Time   Final    GRAM NEGATIVE RODS IN BOTH AEROBIC AND ANAEROBIC BOTTLES CRITICAL RESULT CALLED TO, READ BACK BY AND VERIFIED WITH: PHARMD MIHN P. [phone] FCP Performed at [facility], [place] [address], [place], [place] [address]    Culture (A)  Final    ESCHERICHIA COLI KLEBSIELLA PNEUMONIAE PROTEUS PENNERI    Report Status 02/27/2020 FINAL  Final   Organism ID, Bacteria ESCHERICHIA COLI  Final   Organism ID, Bacteria KLEBSIELLA PNEUMONIAE  Final   Organism ID, Bacteria PROTEUS PENNERI  Final      Susceptibility   Escherichia coli -  MIC*    AMPICILLIN <=2 SENSITIVE Sensitive     CEFAZOLIN <=4 SENSITIVE Sensitive     CEFEPIME <=0.12 SENSITIVE Sensitive     CEFTAZIDIME <=1 SENSITIVE Sensitive     CEFTRIAXONE <=0.25 SENSITIVE Sensitive     CIPROFLOXACIN <=0.25 SENSITIVE Sensitive     GENTAMICIN <=1 SENSITIVE Sensitive     IMIPENEM <=0.25 SENSITIVE Sensitive     TRIMETH/SULFA <=20 SENSITIVE Sensitive     AMPICILLIN/SULBACTAM <=2 SENSITIVE Sensitive     PIP/TAZO <=4 SENSITIVE Sensitive     * ESCHERICHIA COLI   Klebsiella pneumoniae - MIC*    AMPICILLIN >=32 RESISTANT Resistant     CEFAZOLIN <=4 SENSITIVE Sensitive     CEFEPIME <=0.12 SENSITIVE Sensitive     CEFTAZIDIME <=1 SENSITIVE Sensitive     CEFTRIAXONE <=0.25 SENSITIVE Sensitive     CIPROFLOXACIN <=0.25 SENSITIVE Sensitive     GENTAMICIN <=1 SENSITIVE Sensitive     IMIPENEM <=0.25 SENSITIVE Sensitive     TRIMETH/SULFA <=20 SENSITIVE Sensitive     AMPICILLIN/SULBACTAM 4 SENSITIVE Sensitive     PIP/TAZO <=4 SENSITIVE Sensitive     * KLEBSIELLA PNEUMONIAE   Proteus penneri - MIC*    AMPICILLIN >=32 RESISTANT Resistant     CEFAZOLIN >=64 RESISTANT Resistant     CEFEPIME <=0.12 SENSITIVE Sensitive     CEFTAZIDIME <=1 SENSITIVE Sensitive     CEFTRIAXONE <=0.25 SENSITIVE Sensitive     CIPROFLOXACIN <=0.25 SENSITIVE Sensitive     GENTAMICIN <=1 SENSITIVE Sensitive     IMIPENEM 2 SENSITIVE Sensitive     TRIMETH/SULFA <=20 SENSITIVE Sensitive     AMPICILLIN/SULBACTAM 16 INTERMEDIATE Intermediate     PIP/TAZO <=4 SENSITIVE Sensitive     * PROTEUS PENNERI  Blood Culture ID Panel (Reflexed)     Status: Abnormal    Collection Time: 02/24/20  2:47 AM  Result Value Ref Range Status   Enterococcus species NOT DETECTED NOT DETECTED Final   Listeria monocytogenes NOT DETECTED NOT DETECTED Final   Staphylococcus species NOT DETECTED NOT DETECTED Final   Staphylococcus aureus (BCID) NOT DETECTED NOT DETECTED Final   Streptococcus species NOT DETECTED NOT DETECTED Final   Streptococcus agalactiae NOT DETECTED NOT DETECTED Final   Streptococcus pneumoniae NOT DETECTED NOT DETECTED Final   Streptococcus pyogenes NOT DETECTED NOT DETECTED Final   Acinetobacter baumannii NOT DETECTED NOT DETECTED Final   Enterobacteriaceae species DETECTED (A) NOT DETECTED Final    Comment: CRITICAL RESULT CALLED TO, READ BACK BY AND VERIFIED WITH: PHARMD MIHN P. [phone] FCP    Enterobacter cloacae complex NOT DETECTED NOT DETECTED Final   Escherichia coli DETECTED (A) NOT DETECTED Final    Comment: CRITICAL RESULT CALLED TO, READ BACK BY AND VERIFIED WITH: PHARMD MIHN P. [id number] FCP    Klebsiella oxytoca NOT DETECTED NOT DETECTED Final   Klebsiella pneumoniae DETECTED (A) NOT DETECTED Final    Comment: CRITICAL RESULT CALLED TO, READ BACK BY AND VERIFIED WITH: PHARMD MIHN P. [id number] FCP    Proteus species DETECTED (A) NOT DETECTED Final    Comment: CRITICAL RESULT CALLED TO, READ BACK BY AND VERIFIED WITH: PHARMD MIHN P. [id number] FCP    Serratia marcescens NOT DETECTED NOT DETECTED Final   Carbapenem resistance NOT DETECTED NOT DETECTED Final   Haemophilus influenzae NOT DETECTED NOT DETECTED Final   Neisseria meningitidis NOT DETECTED NOT DETECTED Final   Pseudomonas aeruginosa NOT DETECTED NOT DETECTED Final   Candida albicans NOT DETECTED NOT DETECTED Final   Candida  glabrata NOT DETECTED NOT DETECTED Final   Candida krusei NOT DETECTED NOT DETECTED Final   Candida parapsilosis NOT DETECTED NOT DETECTED Final   Candida tropicalis NOT DETECTED NOT DETECTED Final    Comment: Performed at [facility], [place] [address], [place], [place] [address]  Blood culture (routine x 2)     Status: Abnormal   Collection Time: 02/24/20  3:56 AM   Specimen: BLOOD LEFT HAND  Result Value Ref Range Status   Specimen Description BLOOD LEFT HAND  Final   Special Requests   Final    BOTTLES DRAWN AEROBIC ONLY Blood Culture results may not be optimal due to an inadequate volume of blood received in culture bottles   Culture  Setup Time   Final    AEROBIC BOTTLE ONLY GRAM NEGATIVE RODS CRITICAL VALUE NOTED.  VALUE IS CONSISTENT WITH PREVIOUSLY REPORTED AND CALLED VALUE.    Culture (A)  Final    PROTEUS PENNERI SUSCEPTIBILITIES PERFORMED ON PREVIOUS CULTURE WITHIN THE LAST 5 DAYS. Performed at [facility], [place] [address], [place], [place] [address]    Report Status 02/27/2020 FINAL  Final  SARS Coronavirus 2 by RT PCR (hospital order, performed in [facility] hospital lab) Nasopharyngeal Nasopharyngeal Swab     Status: None   Collection Time: 02/24/20  6:04 AM   Specimen: Nasopharyngeal Swab  Result Value Ref Range Status   SARS Coronavirus 2 NEGATIVE NEGATIVE Final    Comment: (NOTE) SARS-CoV-2 target nucleic acids are NOT DETECTED.  The SARS-CoV-2 RNA is generally detectable in upper and lower respiratory specimens during the acute phase of infection. The lowest concentration of SARS-CoV-2 viral copies this assay can detect is 250 copies / mL. A negative result does not preclude SARS-CoV-2 infection and should not be used as the sole basis for treatment or other patient management decisions.  A negative result may occur with improper specimen collection / handling, submission of specimen other than nasopharyngeal swab, presence of viral mutation(s) within the areas targeted by this assay, and inadequate number of viral copies (<250 copies / mL). A negative result must be combined with clinical observations, patient history, and epidemiological information.  Fact Sheet for Patients:    StrictlyIdeas.no  Fact Sheet for Healthcare Providers: BankingDealers.co.za  This test is not yet approved or  cleared by the [place] FDA and has been authorized for detection and/or diagnosis of SARS-CoV-2 by FDA under an Emergency Use Authorization (EUA).  This EUA will remain in effect (meaning this test can be used) for the duration of the COVID-19 declaration under Section 564(b)(1) of the Act, 21 U.S.C. section 360bbb-3(b)(1), unless the authorization is terminated or revoked sooner.  Performed at [facility], [place] [address], [place], [place] [address]   C Difficile Quick Screen w PCR reflex     Status: None   Collection Time: 02/25/20  4:21 AM   Specimen: STOOL  Result Value Ref Range Status   C Diff antigen NEGATIVE NEGATIVE Final   C Diff toxin NEGATIVE NEGATIVE Final   C Diff interpretation No C. difficile detected.  Final    Comment: Performed at [facility], [place] [address] [place],  [address]  Gastrointestinal Panel by PCR , Stool     Status: None   Collection Time: 02/25/20  4:21 AM   Specimen: STOOL  Result Value Ref Range Status   Campylobacter species NOT DETECTED NOT DETECTED Final   Plesimonas shigelloides NOT DETECTED NOT  DETECTED Final   Salmonella species NOT DETECTED NOT DETECTED Final   Yersinia enterocolitica NOT DETECTED NOT DETECTED Final   Vibrio species NOT DETECTED NOT DETECTED Final   Vibrio cholerae NOT DETECTED NOT DETECTED Final   Enteroaggregative E coli (EAEC) NOT DETECTED NOT DETECTED Final   Enteropathogenic E coli (EPEC) NOT DETECTED NOT DETECTED Final   Enterotoxigenic E coli (ETEC) NOT DETECTED NOT DETECTED Final   Shiga like toxin producing E coli (STEC) NOT DETECTED NOT DETECTED Final   Shigella/Enteroinvasive E coli (EIEC) NOT DETECTED NOT DETECTED Final   Cryptosporidium NOT DETECTED NOT DETECTED Final   Cyclospora cayetanensis NOT DETECTED NOT DETECTED Final    Entamoeba histolytica NOT DETECTED NOT DETECTED Final   Giardia lamblia NOT DETECTED NOT DETECTED Final   Adenovirus F40/41 NOT DETECTED NOT DETECTED Final   Astrovirus NOT DETECTED NOT DETECTED Final   Norovirus GI/GII NOT DETECTED NOT DETECTED Final   Rotavirus A NOT DETECTED NOT DETECTED Final   Sapovirus (I, II, IV, and V) NOT DETECTED NOT DETECTED Final    Comment: Performed at [facility], [place] [place], [place] [address]  MRSA PCR Screening     Status: None   Collection Time: 02/25/20  4:57 AM  Result Value Ref Range Status   MRSA by PCR NEGATIVE NEGATIVE Final    Comment:        The GeneXpert MRSA Assay (FDA approved for NASAL specimens only), is one component of a comprehensive MRSA colonization surveillance program. It is not intended to diagnose MRSA infection nor to guide or monitor treatment for MRSA infections. Performed at [facility], [place] [address] [place], [place] [address]   Gram stain     Status: None   Collection Time: 02/25/20  3:21 PM   Specimen: Abdomen  Result Value Ref Range Status   Specimen Description ABDOMEN  Final   Special Requests PERITONEAL FLUID  Final   Gram Stain   Final    ABUNDANT WBC PRESENT, PREDOMINANTLY PMN NO ORGANISMS SEEN Performed at [facility], [place] [address], [place], [place] [address]    Report Status 02/25/2020 FINAL  Final  Culture, body fluid-bottle     Status: None (Preliminary result)   Collection Time: 02/25/20  3:21 PM   Specimen: Abdomen  Result Value Ref Range Status   Specimen Description ABDOMEN  Final   Special Requests PERITONEAL FLUID  Final   Culture   Final    NO GROWTH 4 DAYS Performed at [place] [address] [place], [place] [address]    Report Status PENDING  Incomplete    Radiology Reports CT ABDOMEN PELVIS W CONTRAST  Result Date: 02/28/2020 CLINICAL DATA:  Abdominal abscess/infection suspected Abdominal pain.  Patient had recent  paracentesis. EXAM: CT ABDOMEN AND PELVIS WITH CONTRAST TECHNIQUE: Multidetector CT imaging of the abdomen and pelvis was performed using the standard protocol following bolus administration of intravenous contrast. CONTRAST:  158mL OMNIPAQUE IOHEXOL 300 MG/ML  SOLN COMPARISON:  Abdominal CT and ultrasound 4 days ago, 02/24/2020. multiple prior CT. FINDINGS: Lower chest: Calcified granuloma in the right lower lobe. Linear atelectasis in the left lower lobe. No pleural fluid. Heart is normal in size. Hepatobiliary: Cirrhotic hepatic morphology with nodular contours. The liver is enlarged spanning 26.7 cm cranial caudal. Again seen 1.2 cm low-density lesion in the inferior right lobe of the liver, series 3, image 65, unchanged from recent exam. No significant peripheral enhancement. There multiple small subcentimeter  adjacent hepatic hypodensities in the right lobe extending from images 59 through 66. Subcentimeter vague hypodensity in the central right lobe series 3, image 39. Postcholecystectomy. Biliary stent remains in place, no significant intra or extrahepatic biliary ductal dilatation. Mild pneumobilia. Pancreas: No ductal dilatation or inflammation. Spleen: No splenomegaly. Unchanged cleft posteriorly. No focal lesion. Adrenals/Urinary Tract: No adrenal nodule. No hydronephrosis or perinephric edema. Homogeneous renal enhancement with symmetric excretion on delayed phase imaging. Urinary bladder is partially distended without wall thickening. Stomach/Bowel: Patulous distal esophagus. Nondistended stomach. Normal positioning of the duodenum and ligament of Treitz. Administered enteric contrast reaches the colon. There is no bowel obstruction. No significant bowel inflammation. Appendix is air-filled and otherwise normal. Distal colonic diverticulosis without evidence of diverticulitis. Vascular/Lymphatic: Normal caliber abdominal aorta. No evidence of portal vein thrombosis. No bulky abdominopelvic adenopathy.  Reproductive: Prominent prostate gland. Other: Moderate volume abdominopelvic ascites. This measures simple fluid density. There is no peripheral enhancement or loculation. No free intra-abdominal air. Mesenteric and omental edema is typical of ascites. Small umbilical hernia containing fat and small amount of ascites. Mild skin thickening adjacent to the umbilical hernia in the midline. Fat in both inguinal canals. Musculoskeletal: There are no acute or suspicious osseous abnormalities. Peripherally sclerotic lesion in the left femoral head is unchanged from prior exams. 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 IMPRESSION: 1. Stable low-density lesions in the right lobe of the liver, largest measuring 1.2 cm, suspicious for small abscesses. No significant progression or convalescence. Vague subcentimeter structure in the central liver was not definitively seen on prior exam, unclear if this represents a new area of infection or better seen currently due to differences in phase of contrast. 2. Moderate volume abdominopelvic ascites. There is no peripheral enhancement or loculation. 3. Cirrhosis. Biliary stent remains in place, no significant intra or extrahepatic biliary ductal dilatation. 4. Colonic diverticulosis without diverticulitis. 5. Small umbilical hernia containing fat and small amount of ascites. Mild skin thickening adjacent to the umbilical hernia in the midline. 6. Additional stable chronic findings as described. Electronically Signed   By: Keith Rake M.D.   On: 02/28/2020 15:25   CT ABDOMEN PELVIS W CONTRAST  Result Date: 02/24/2020 CLINICAL DATA:  Generalized abdominal pain, swelling, and jaundice with vomiting and fever. Leukocytosis. EXAM: CT ABDOMEN AND PELVIS WITH CONTRAST TECHNIQUE: Multidetector CT imaging of the abdomen and pelvis was performed using the standard protocol following  bolus administration of intravenous contrast. CONTRAST:  144mL OMNIPAQUE IOHEXOL 300 MG/ML  SOLN COMPARISON:  12/24/2019 FINDINGS: Lower chest: Punctate calcified nodules in the right middle lobe and right lower lobe. No basilar lung consolidation or pleural effusion. Hepatobiliary: There is a new 1.2 cm hypodense lesion inferiorly in the right hepatic lobe (series 3, image 63), and there are additional more subtle subcentimeter hypodensities in the inferior right hepatic lobe which also appear new (for example series 3, images 56, 58, 59, and 62). Status post cholecystectomy. A biliary stent remains in place without significant dilatation of the common bile duct, however there is increased dilatation of the common hepatic duct about the proximal aspect of the stent measuring 2.1 cm in diameter. There is at most minimal central intrahepatic biliary dilatation. Pancreas: Unremarkable. Spleen: No focal lesion. Unchanged cleft along the posterior splenic margin. Adrenals/Urinary Tract: Unremarkable adrenal glands. No evidence of renal mass, calculi, or hydronephrosis. Unremarkable bladder. Stomach/Bowel: The stomach is unremarkable. There is no evidence of bowel obstruction. Mildly  prominent gaseous distension of the appendix without evidence of appendicitis. Vascular/Lymphatic: No significant vascular findings are present. No enlarged abdominal or pelvic lymph nodes. Reproductive: Mildly enlarged prostate. Other: Increased small to moderate volume ascites. No loculated fluid collection. No pneumoperitoneum. Small umbilical hernia containing fat and fluid. Musculoskeletal: No acute osseous abnormality or suspicious osseous lesion. IMPRESSION: 1. New small ill-defined hypodensities inferiorly in the right hepatic lobe, nonspecific though infection/small abscesses are a concern with this history. 2. Increased small to moderate volume ascites. 3. Unchanged biliary stent with increased dilatation of the common hepatic  duct. 4. Aortic Atherosclerosis (ICD10-I70.0). Electronically Signed   By: Logan Bores M.D.   On: 02/24/2020 05:42   DG Chest Port 1 View  Result Date: 02/27/2020 CLINICAL DATA:  Shortness of breath.  Sepsis. EXAM: PORTABLE CHEST 1 VIEW COMPARISON:  12/03/2019 FINDINGS: Lordotic technique is demonstrated. Lungs are adequately inflated without focal airspace consolidation or effusion. Borderline stable cardiomegaly. Remainder of the exam is unchanged. IMPRESSION: No acute cardiopulmonary disease. Electronically Signed   By: Marin Olp M.D.   On: 02/27/2020 09:13   DG ERCP BILIARY & PANCREATIC DUCTS  Result Date: 02/24/2020 CLINICAL DATA:  ERCP. EXAM: ERCP TECHNIQUE: Multiple spot images obtained with the fluoroscopic device and submitted for interpretation post-procedure. FLUOROSCOPY TIME:  Fluoroscopy Time:  2 minutes and 13 seconds Radiation Exposure Index (if provided by the fluoroscopic device): 66.82 mGy Number of Acquired Spot Images: 6 COMPARISON:  CT dated February 24, 2020.  11/29/2019 FINDINGS: And insult spot images demonstrate a common bile duct stent. The stent has been removed. Injection of contrast followed by balloon sweep demonstrates a persistent narrowing of the mid and distal common bile duct. There appears to be some mild intrahepatic biliary ductal dilatation. The patient is status post prior cholecystectomy. Final images demonstrate replacement of the plastic biliary stent. IMPRESSION: Status post ERCP as detailed above. These images were submitted for radiologic interpretation only. Please see the procedural report for the amount of contrast and the fluoroscopy time utilized. Electronically Signed   By: Constance Holster M.D.   On: 02/24/2020 18:49   US Abdomen Limited RUQ  Result Date: 02/24/2020 CLINICAL DATA:  Jaundice EXAM: ULTRASOUND ABDOMEN LIMITED RIGHT UPPER QUADRANT COMPARISON:  12/24/2019 FINDINGS: Gallbladder: Surgically absent Common bile duct: Diameter: 8 mm Liver:  Nodularity of the liver capsule compatible with cirrhosis. No focal parenchymal abnormality. No intrahepatic duct dilation. Portal vein is patent on color Doppler imaging with normal direction of blood flow towards the liver. Other: There is a small amount of ascites throughout the abdomen, greatest in the lower quadrants. IMPRESSION: 1. Small volume ascites. 2. Findings consistent with hepatic cirrhosis. 3. Cholecystectomy. Electronically Signed   By: Randa Ngo M.D.   On: 02/24/2020 03:23   IR Paracentesis  Result Date: 02/28/2020 INDICATION: Abdominal pain, distention. Recurrent ascites. Request for therapeutic paracentesis. EXAM: ULTRASOUND GUIDED LEFT LOWER QUADRANT PARACENTESIS MEDICATIONS: None. COMPLICATIONS: None immediate. PROCEDURE: Informed written consent was obtained from the patient after a discussion of the risks, benefits and alternatives to treatment. A timeout was performed prior to the initiation of the procedure. Initial ultrasound scanning demonstrates a small to moderate amount of ascites within the left lower abdominal quadrant. The left lower abdomen was prepped and draped in the usual sterile fashion. 1% lidocaine was used for local anesthesia. Following this, a 19 gauge, 7-cm, Yueh catheter was introduced. An ultrasound image was saved for documentation purposes. The paracentesis was performed. The catheter was removed and a dressing  was applied. The patient tolerated the procedure well without immediate post procedural complication. FINDINGS: A total of approximately 1.9 L of clear yellow fluid was removed. IMPRESSION: Successful ultrasound-guided paracentesis yielding 1.9 liters of peritoneal fluid. Read by: Ascencion Dike PA-C Electronically Signed   By: Corrie Mckusick D.O.   On: 02/28/2020 16:23   IR Paracentesis  Result Date: 02/25/2020 INDICATION: Patient with a history of alcoholic hepatitis and ascites presents today for a therapeutic and diagnostic paracentesis. EXAM:  ULTRASOUND GUIDED PARACENTESIS MEDICATIONS: 1% lidocaine 10 mL COMPLICATIONS: None immediate PROCEDURE: Informed written consent was obtained from the patient after a discussion of the risks, benefits and alternatives to treatment. A timeout was performed prior to the initiation of the procedure. Initial ultrasound scanning demonstrates a large amount of ascites within the left lower abdominal quadrant. The left lower abdomen was prepped and draped in the usual sterile fashion. 1% lidocaine was used for local anesthesia. Following this, a 19 gauge, 7-cm, Yueh catheter was introduced. An ultrasound image was saved for documentation purposes. The paracentesis was performed. The catheter was removed and a dressing was applied. The patient tolerated the procedure well without immediate post procedural complication. FINDINGS: A total of approximately 2.2 L of dark yellow fluid was removed. Samples were sent to the laboratory as requested by the clinical team. IMPRESSION: Successful ultrasound-guided paracentesis yielding 2.2 liters of peritoneal fluid. Read by: Soyla Dryer, NP Electronically Signed   By: Lucrezia Europe M.D.   On: 02/25/2020 14:28      Phillips Climes M.D on 03/02/2020 at 2:54 PM  To page go to [email or web address]

## 2020-03-02 NOTE — Progress Notes (Signed)
Ok to change SQ heparin to Lovenox 50mg  SQ q24 for DVT prophylaxis per Dr Waldron Labs.  Onnie Boer, PharmD, BCIDP, AAHIVP, CPP Infectious Disease Pharmacist 03/02/2020 2:03 PM

## 2020-03-02 NOTE — Progress Notes (Signed)
[facility] Progress Note  Subjective: No complaints today.  Objective: Vital signs in last 24 hours: Temp:  [98.1 F (36.7 C)-98.3 F (36.8 C)] 98.3 F (36.8 C) (07/29 1211) Pulse Rate:  [72-82] 77 (07/29 1211) Resp:  [18-21] 21 (07/29 1211) BP: (128-133)/(51-90) 128/51 (07/29 1211) SpO2:  [90 %-95 %] 92 % (07/29 1211) Weight change:    PE:  No distress  Abdomen nontender, ascites present  Lab Results: Results for orders placed or performed during the hospital encounter of 02/23/20 (from the past 24 hour(s))  Protime-INR     Status: None   Collection Time: 03/02/20  3:36 AM  Result Value Ref Range   Prothrombin Time 15.2 11.4 - 15.2 seconds   INR 1.2 0.8 - 1.2  Procalcitonin     Status: None   Collection Time: 03/02/20  3:36 AM  Result Value Ref Range   Procalcitonin 0.37 ng/mL  Magnesium     Status: None   Collection Time: 03/02/20  3:36 AM  Result Value Ref Range   Magnesium 1.7 1.7 - 2.4 mg/dL  Comprehensive metabolic panel     Status: Abnormal   Collection Time: 03/02/20  3:36 AM  Result Value Ref Range   Sodium 136 135 - 145 mmol/L   Potassium 4.0 3.5 - 5.1 mmol/L   Chloride 102 98 - 111 mmol/L   CO2 25 22 - 32 mmol/L   Glucose, Bld 109 (H) 70 - 99 mg/dL   BUN 10 6 - 20 mg/dL   Creatinine, Ser 0.93 0.61 - 1.24 mg/dL   Calcium 8.5 (L) 8.9 - 10.3 mg/dL   Total Protein 6.0 (L) 6.5 - 8.1 g/dL   Albumin 2.1 (L) 3.5 - 5.0 g/dL   AST 47 (H) 15 - 41 U/L   ALT 34 0 - 44 U/L   Alkaline Phosphatase 112 38 - 126 U/L   Total Bilirubin 2.4 (H) 0.3 - 1.2 mg/dL   GFR calc non Af Amer >60 >60 mL/min   GFR calc Af Amer >60 >60 mL/min   Anion gap 9 5 - 15  CBC with Differential/Platelet     Status: Abnormal   Collection Time: 03/02/20  3:36 AM  Result Value Ref Range   WBC 10.6 (H) 4.0 - 10.5 K/uL   RBC 4.32 4.22 - 5.81 MIL/uL   Hemoglobin 13.9 13.0 - 17.0 g/dL   HCT 41.6 39 - 52 %   MCV 96.3 80.0 - 100.0 fL   MCH 32.2 26.0 - 34.0 pg   MCHC 33.4 30.0  - 36.0 g/dL   RDW 16.1 (H) 11.5 - 15.5 %   Platelets 326 150 - 400 K/uL   nRBC 0.0 0.0 - 0.2 %   Neutrophils Relative % 65 %   Neutro Abs 6.9 1.7 - 7.7 K/uL   Lymphocytes Relative 21 %   Lymphs Abs 2.3 0.7 - 4.0 K/uL   Monocytes Relative 10 %   Monocytes Absolute 1.1 (H) 0 - 1 K/uL   Eosinophils Relative 1 %   Eosinophils Absolute 0.1 0 - 0 K/uL   Basophils Relative 1 %   Basophils Absolute 0.1 0 - 0 K/uL   Immature Granulocytes 2 %   Abs Immature Granulocytes 0.22 (H) 0.00 - 0.07 K/uL  Brain natriuretic peptide     Status: Abnormal   Collection Time: 03/02/20  3:36 AM  Result Value Ref Range   B Natriuretic Peptide 102.7 (H) 0.0 - 100.0 pg/mL  C-reactive protein     Status:  Abnormal   Collection Time: 03/02/20  3:36 AM  Result Value Ref Range   CRP 1.7 (H) <1.0 mg/dL    Studies/Results: No results found.    Assessment: Cirrhosis of liver  Ascites  Small abscesses right lobe of the liver  Biliary stent in place from prior bile leak  Plan:   Continue current management    Cassell Clement 03/02/2020, 1:33 PM  Pager: [phone] If no answer or after 5 PM call [phone]

## 2020-03-02 NOTE — Progress Notes (Signed)
Patient is s/p:  11/22/2019 laparoscopic cholecystectomy, Dr. Grandville Silos 11/29/19 ERCP w/ stent placement for bile leak Dr. Watt Climes  12/07/2019 diagnostic laparoscopy, evacuation of bilious ascites (3L), blake drain placement Dr. Donne Hazel 12/23/2019 blake drain removed in [place] office  02/24/2020 ERCP, stent exchange  7/23, 02/28/20 paracentesis  02/28/20 - CT A/P w/ stable low-density liver lesions/small abscesses  liver abscesses, likely pyogenic due to recent biliary surgery and intra-abdominal contamination with bile  -afebrile, VSS, WBC continues to trend down - sepsis improving on IV abx, Rocephin/Flagyl - transition to PO abx upon discharge  - RUQ pain is likely caused by liver lesions/abscesses, no acute surgical needs as the patient is clinically improving. IR feels liver lesions too small to drain/aspirate - continue paracentesis as indicated  - recommend follow up with GI and CCS in office - no other surgical recommendations at this time, we will sign off   Norm Parcel , [facility] 03/02/2020, 10:11 AM Please see Amion for pager number during day hours 7:00am-4:30pm

## 2020-03-03 LAB — COMPREHENSIVE METABOLIC PANEL
ALT: 32 U/L (ref 0–44)
AST: 45 U/L — ABNORMAL HIGH (ref 15–41)
Albumin: 2.4 g/dL — ABNORMAL LOW (ref 3.5–5.0)
Alkaline Phosphatase: 112 U/L (ref 38–126)
Anion gap: 10 (ref 5–15)
BUN: 9 mg/dL (ref 6–20)
CO2: 24 mmol/L (ref 22–32)
Calcium: 8.5 mg/dL — ABNORMAL LOW (ref 8.9–10.3)
Chloride: 100 mmol/L (ref 98–111)
Creatinine, Ser: 0.95 mg/dL (ref 0.61–1.24)
GFR calc Af Amer: >60 mL/min (ref >60)
GFR calc non Af Amer: >60 mL/min (ref >60)
Glucose, Bld: 110 mg/dL — ABNORMAL HIGH (ref 70–99)
Potassium: 4.2 mmol/L (ref 3.5–5.1)
Sodium: 134 mmol/L — ABNORMAL LOW (ref 135–145)
Total Bilirubin: 2.3 mg/dL — ABNORMAL HIGH (ref 0.3–1.2)
Total Protein: 6.4 g/dL — ABNORMAL LOW (ref 6.5–8.1)

## 2020-03-03 MED ORDER — AMOXICILLIN-POT CLAVULANATE 875-125 MG PO TABS
1.0000 | ORAL_TABLET | Freq: Two times a day (BID) | ORAL | Status: DC
  Administered 2020-03-03: 1 via ORAL
  Filled 2020-03-03: qty 1

## 2020-03-03 MED ORDER — SPIRONOLACTONE 25 MG PO TABS
50.0000 mg | ORAL_TABLET | Freq: Every day | ORAL | Status: DC
  Administered 2020-03-04 – 2020-03-20 (×17): 50 mg via ORAL
  Filled 2020-03-03 (×17): qty 2

## 2020-03-03 MED ORDER — SODIUM CHLORIDE 0.9 % IV SOLN
2.0000 g | INTRAVENOUS | Status: DC
  Administered 2020-03-04 – 2020-03-08 (×6): 2 g via INTRAVENOUS
  Filled 2020-03-03 (×4): qty 20
  Filled 2020-03-03: qty 2
  Filled 2020-03-03: qty 20
  Filled 2020-03-03 (×2): qty 2

## 2020-03-03 MED ORDER — METRONIDAZOLE IN NACL 5-0.79 MG/ML-% IV SOLN
500.0000 mg | Freq: Three times a day (TID) | INTRAVENOUS | Status: DC
  Administered 2020-03-03 – 2020-03-09 (×18): 500 mg via INTRAVENOUS
  Filled 2020-03-03 (×18): qty 100

## 2020-03-03 NOTE — Progress Notes (Signed)
Patient refused AM labs

## 2020-03-03 NOTE — Progress Notes (Signed)
PT Cancellation Note  Patient Details Name: Micheal Watson MRN: [id number] DOB: 1961-07-17   Cancelled Treatment:    Reason Eval/Treat Not Completed: Patient declined, no reason specified - Pt reports he wants to rest, and already has been walking in hallway. PT to check back at a later date.  [place] Pager [phone]  Office [phone]    [place] 03/03/2020, 10:34 AM

## 2020-03-03 NOTE — Progress Notes (Signed)
PROGRESS NOTE                                                                                                                                                                                                             Micheal Watson Demographics:    Micheal Watson, is a 59 y.o. male, DOB - 07-30-1961, EXN:[id number]  Admit date - 02/23/2020   Admitting Physician Karmen Bongo, MD  Outpatient Primary MD for the Micheal Watson is Micheal Watson, No Pcp Per  LOS - 8  Chief Complaint  Micheal Watson presents with  . Abdominal Pain  . Jaundice       Brief Narrative  - Micheal Watson is a 59 y.o. male with medical history significant of ETOH dependence; recent acute cholecystitis/cholangitis s/p CBD stent and JP drain placement (removed since).  He is now presenting with recurrent LLQ abdomnial pain.   Subjective:   Micheal Watson in bed, appears comfortable, denies any headache, no fever, no chest pain or pressure, no shortness of breath , he denies any abdominal pain, managed with abdominal tenderness to palpation.   Assessment  & Plan :     Sepsis from SBP following intra-abdominal surgery with current hyperbilirubinemia and obstructed CBD stent along with small Hepatic abscess - in a Micheal Watson with following history  -Hx acute cholecystitis with hydrops; laparoscopic cholecystectomy with postoperative bile leak 11/22/2019 Dr. 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 Clarene Essex -Diagnostic laparoscopy, evacuation of bilious ascites(3L),JP drain placement 12/07/2019 Dr. Donne Hazel -Drain removal 12/23/2019 - ERCP by Dr. Paulita Fujita on 02/24/2020 with CBD biliary stent stricture, stent removal and new stent placed.  Biopsy sent. -Ultrasound-guided paracentesis on 02/24/2021 with 2.2 L of fluid removed - SBP -Another ultrasound-guided paracentesis 7/26 with one-point liters of fluid drained, remains significant for SBP. -Repeat CT scan of abdomen pelvis ordered on  02/28/2020 -shows liver abscesses with largest one being 1.1 cm.  IR has been consulted, collections are too small to drain. -Continue with diuresis, he is on Lasix 20 mg daily, will increase Aldactone to 50 mg, monitor renal function and potassium closely.   Sepsis due to polymicrobial gram-negative bacteremia caused by SBP/ ? Hepatic abscesses, E. coli, Klebsiella and Proteus growing bacteremia.  Abdominal fluid cultures consistent with SBP.  Both GI and general surgery have been consulted.  He is currently on Rocephin and Flagyl and sepsis pathophysiology is improving.  He still having some right upper quadrant discomfort and repeat CT scan suggests a 1.1 cm liver abscess with  multiple small foci, discussed with ID about antibiotic recommendation, recommendation for Augmentin on discharge for another 2 weeks.  For now we will continue with IV antibiotics .  -Per GI has been started on diuretics for his recurrent ascites.  So far he has had 2 ultrasound-guided paracentesis with total 4 L of fluid removed and consistent with SBP.  Blood cultures with no growth to date   Recent Labs  Lab 02/27/20 0505 02/28/20 0401 02/29/20 0344 03/01/20 0836 03/02/20 0336  WBC 10.7* 12.2* 14.9* 12.1* 10.6*  PLT 299 311 302 357 326  CRP 4.4* 3.6* 3.3* 3.8* 1.7*  PROCALCITON 2.52 2.52 1.01 0.50 0.37  LATICACIDVEN 2.5*  --   --   --   --     HTN -blood pressure stable off of medications.  HLD - He was prescribed Lipitor prior but has not been taking it.  ETOH dependence - ongoing abuse, no signs of withdrawal on CIWA protocol.  Micheal Watson reports he drinks 40 ounce of beer every night .  h/o COVID-19 infection in April 2020  -  Resolved.    Condition - Extremely Guarded  Family Communication  :  None at bedside  Code Status :  Full  Consults  :  GI, CCS  Procedures  :    CT Repeat - Non acute - 1. Stable low-density lesions in the right lobe of the liver, largest measuring 1.2 cm, suspicious for  small abscesses. No significant progression or convalescence. Vague subcentimeter structure in the central liver was not definitively seen on prior exam, unclear if this represents a new area of infection or better seen currently due to differences in phase of contrast. 2. Moderate volume abdominopelvic ascites. There is no peripheral enhancement or loculation. 3. Cirrhosis. Biliary stent remains in place, no significant intra or extrahepatic biliary ductal dilatation. 4. Colonic diverticulosis without diverticulitis. 5. Small umbilical hernia containing fat and small amount of ascites. Mild skin thickening adjacent to the umbilical hernia in the midline.   Ultrasound-guided paracentesis.  SBP.    CT -  1. New small ill-defined hypodensities inferiorly in the right hepatic lobe, nonspecific though infection/small abscesses are a concern with this history. 2. Increased small to moderate volume ascites. 3. Unchanged biliary stent with increased dilatation of the common hepatic duct. 4. Aortic Atherosclerosis  ERCP - with CBD stricture at the site of her stent, stent removed and new temporary stent placed on 02/24/2020 by Dr. Paulita Fujita.  PUD Prophylaxis : PPI  Disposition Plan  :    Status is: Inpatient  Remains inpatient appropriate because:IV treatments appropriate due to intensity of illness or inability to take PO   Dispo: The Micheal Watson is from: Home              Anticipated d/c is to: Home              Anticipated d/c date is: 3 days              Micheal Watson currently is not medically stable to d/c.   DVT Prophylaxis  : Heparin    Lab Results  Component Value Date   PLT 326 03/02/2020    Diet :  Diet Order            DIET SOFT Room service appropriate? Yes; Fluid consistency: Thin  Diet effective now                  Inpatient Medications Scheduled Meds: . enoxaparin (LOVENOX) injection  50  mg Subcutaneous Q24H  . folic acid  1 mg Oral Daily  . furosemide  20 mg Oral Daily  .  multivitamin with minerals  1 tablet Oral Daily  . pantoprazole  40 mg Oral Daily  . sodium chloride flush  3 mL Intravenous Q12H  . spironolactone  25 mg Oral Daily  . thiamine  100 mg Oral Daily   Or  . thiamine  100 mg Intravenous Daily   Continuous Infusions: . sodium chloride 10 mL/hr at 03/03/20 5993  . albumin human    . cefTRIAXone (ROCEPHIN)  IV    . metronidazole 500 mg (03/03/20 1203)   PRN Meds:.sodium chloride, albumin human, HYDROcodone-acetaminophen, lidocaine, lidocaine, [DISCONTINUED] ondansetron **OR** ondansetron (ZOFRAN) IV  Antibiotics  :   Anti-infectives (From admission, onward)   Start     Dose/Rate Route Frequency Ordered Stop   03/03/20 2200  cefTRIAXone (ROCEPHIN) 2 g in sodium chloride 0.9 % 100 mL IVPB     Discontinue     2 g 200 mL/hr over 30 Minutes Intravenous Every 24 hours 03/03/20 1010     03/03/20 1200  metroNIDAZOLE (FLAGYL) IVPB 500 mg     Discontinue     500 mg 100 mL/hr over 60 Minutes Intravenous Every 8 hours 03/03/20 1010     03/03/20 1000  amoxicillin-clavulanate (AUGMENTIN) 875-125 MG per tablet 1 tablet  Status:  Discontinued        1 tablet Oral Every 12 hours 03/03/20 0735 03/03/20 1010   02/25/20 2000  cefTRIAXone (ROCEPHIN) 2 g in sodium chloride 0.9 % 100 mL IVPB  Status:  Discontinued        2 g 200 mL/hr over 30 Minutes Intravenous Every 24 hours 02/24/20 1847 03/03/20 0735   02/25/20 1515  metroNIDAZOLE (FLAGYL) IVPB 500 mg  Status:  Discontinued        500 mg 100 mL/hr over 60 Minutes Intravenous Every 8 hours 02/25/20 1455 03/03/20 0735   02/24/20 1830  cefTRIAXone (ROCEPHIN) 2 g in sodium chloride 0.9 % 100 mL IVPB  Status:  Discontinued        2 g 200 mL/hr over 30 Minutes Intravenous Every 24 hours 02/24/20 1741 02/24/20 1847   02/24/20 1200  piperacillin-tazobactam (ZOSYN) IVPB 3.375 g  Status:  Discontinued        3.375 g 100 mL/hr over 30 Minutes Intravenous Every 6 hours 02/24/20 0934 02/24/20 1009   02/24/20 1200   piperacillin-tazobactam (ZOSYN) IVPB 3.375 g  Status:  Discontinued        3.375 g 12.5 mL/hr over 240 Minutes Intravenous Every 8 hours 02/24/20 1009 02/24/20 1741   02/24/20 0245  piperacillin-tazobactam (ZOSYN) IVPB 3.375 g        3.375 g 100 mL/hr over 30 Minutes Intravenous  Once 02/24/20 0237 02/24/20 0535          Objective:   Vitals:   03/03/20 0017 03/03/20 0439 03/03/20 0914 03/03/20 1206  BP: 117/69 125/76 (!) 144/95 (!) 132/86  Pulse: 74 74 88 74  Resp: 21 21 19 18   Temp: 98.6 F (37 C) 98.4 F (36.9 C) 98.4 F (36.9 C) 98 F (36.7 C)  TempSrc: Oral Oral Oral Oral  SpO2: 92% 91% 93% 96%  Weight:  91.8 kg    Height:        SpO2: 96 % O2 Flow Rate (L/min): 6 L/min  Wt Readings from Last 3 Encounters:  03/03/20 91.8 kg  12/24/19 88.2 kg  11/28/19 96.4 kg  Intake/Output Summary (Last 24 hours) at 03/03/2020 1510 Last data filed at 03/03/2020 1345 Gross per 24 hour  Intake 2021.14 ml  Output 3531 ml  Net -1509.86 ml     Physical Exam  Awake Alert, Oriented X 3, No new F.N deficits, Normal affect Symmetrical Chest wall movement, Good air movement bilaterally, CTAB RRR,No Gallops,Rubs or new Murmurs, No Parasternal Heave +ve B.Sounds, Abd Soft, Micheal Watson with some ascites, diffuse tenderness to palpation, mild, No rebound - guarding or rigidity. No Cyanosis, Clubbing or edema, No new Rash or bruise         Data Review:    Recent Labs  Lab 02/27/20 0505 02/28/20 0401 02/29/20 0344 03/01/20 0836 03/02/20 0336  WBC 10.7* 12.2* 14.9* 12.1* 10.6*  HGB 14.6 14.9 14.2 14.8 13.9  HCT 42.1 43.4 41.2 43.6 41.6  PLT 299 311 302 357 326  MCV 93.8 93.9 93.8 95.6 96.3  MCH 32.5 32.3 32.3 32.5 32.2  MCHC 34.7 34.3 34.5 33.9 33.4  RDW 17.2* 17.1* 16.6* 16.5* 16.1*  LYMPHSABS 2.3 2.7 1.8 2.5 2.3  MONOABS 0.9 1.1* 1.1* 1.2* 1.1*  EOSABS 0.0 0.1 0.1 0.1 0.1  BASOSABS 0.0 0.0 0.1 0.1 0.1    Recent Labs  Lab 02/27/20 0505 02/27/20 0505  02/28/20 0401 02/29/20 0344 03/01/20 0836 03/02/20 0336 03/03/20 1039  NA 135   < > 136 132* 134* 136 134*  K 3.8   < > 3.7 3.8 3.9 4.0 4.2  CL 104   < > 102 100 102 102 100  CO2 22   < > 25 25 23 25 24   GLUCOSE 125*   < > 108* 103* 139* 109* 110*  BUN 15   < > 15 9 8 10 9   CREATININE 0.85   < > 1.01 0.90 0.90 0.93 0.95  CALCIUM 7.9*   < > 7.9* 7.8* 8.3* 8.5* 8.5*  AST 78*   < > 62* 70* 58* 47* 45*  ALT 47*   < > 44 45* 39 34 32  ALKPHOS 122   < > 110 104 112 112 112  BILITOT 4.1*   < > 3.3* 3.1* 2.8* 2.4* 2.3*  ALBUMIN 2.0*   < > 2.0* 2.0* 2.2* 2.1* 2.4*  MG 2.0  --  1.6* 1.8 1.7 1.7  --   CRP 4.4*  --  3.6* 3.3* 3.8* 1.7*  --   PROCALCITON 2.52  --  2.52 1.01 0.50 0.37  --   INR 1.3*  --  1.2 1.3* 1.2 1.2  --   BNP 645.6*  --  133.2* 62.9 98.0 102.7*  --    < > = values in this interval not displayed.    Recent Labs  Lab 02/27/20 0505 02/28/20 0401 02/29/20 0344 03/01/20 0836 03/02/20 0336  CRP 4.4* 3.6* 3.3* 3.8* 1.7*  BNP 645.6* 133.2* 62.9 98.0 102.7*  PROCALCITON 2.52 2.52 1.01 0.50 0.37    ------------------------------------------------------------------------------------------------------------------ No results for input(s): CHOL, HDL, LDLCALC, TRIG, CHOLHDL, LDLDIRECT in the last 72 hours.  Lab Results  Component Value Date   HGBA1C 6.0 (H) 04/10/2019   ------------------------------------------------------------------------------------------------------------------ No results for input(s): TSH, T4TOTAL, T3FREE, THYROIDAB in the last 72 hours.  Invalid input(s): FREET3 ------------------------------------------------------------------------------------------------------------------ No results for input(s): VITAMINB12, FOLATE, FERRITIN, TIBC, IRON, RETICCTPCT in the last 72 hours.  Coagulation profile Recent Labs  Lab 02/27/20 0505 02/28/20 0401 02/29/20 0344 03/01/20 0836 03/02/20 0336  INR 1.3* 1.2 1.3* 1.2 1.2    No results for input(s):  DDIMER in the  last 72 hours.  Cardiac Enzymes No results for input(s): CKMB, TROPONINI, MYOGLOBIN in the last 168 hours.  Invalid input(s): CK ------------------------------------------------------------------------------------------------------------------    Component Value Date/Time   BNP 102.7 (H) 03/02/2020 0336    Micro Results Recent Results (from the past 240 hour(s))  Blood culture (routine x 2)     Status: Abnormal   Collection Time: 02/24/20  2:47 AM   Specimen: BLOOD  Result Value Ref Range Status   Specimen Description BLOOD LEFT ANTECUBITAL  Final   Special Requests   Final    BOTTLES DRAWN AEROBIC AND ANAEROBIC Blood Culture adequate volume   Culture  Setup Time   Final    GRAM NEGATIVE RODS IN BOTH AEROBIC AND ANAEROBIC BOTTLES CRITICAL RESULT CALLED TO, READ BACK BY AND VERIFIED WITH: PHARMD MIHN P. [phone] FCP Performed at [facility], [place] [address] [place], [place] [address]    Culture (A)  Final    ESCHERICHIA COLI KLEBSIELLA PNEUMONIAE PROTEUS PENNERI    Report Status 02/27/2020 FINAL  Final   Organism ID, Bacteria ESCHERICHIA COLI  Final   Organism ID, Bacteria KLEBSIELLA PNEUMONIAE  Final   Organism ID, Bacteria PROTEUS PENNERI  Final      Susceptibility   Escherichia coli - MIC*    AMPICILLIN <=2 SENSITIVE Sensitive     CEFAZOLIN <=4 SENSITIVE Sensitive     CEFEPIME <=0.12 SENSITIVE Sensitive     CEFTAZIDIME <=1 SENSITIVE Sensitive     CEFTRIAXONE <=0.25 SENSITIVE Sensitive     CIPROFLOXACIN <=0.25 SENSITIVE Sensitive     GENTAMICIN <=1 SENSITIVE Sensitive     IMIPENEM <=0.25 SENSITIVE Sensitive     TRIMETH/SULFA <=20 SENSITIVE Sensitive     AMPICILLIN/SULBACTAM <=2 SENSITIVE Sensitive     PIP/TAZO <=4 SENSITIVE Sensitive     * ESCHERICHIA COLI   Klebsiella pneumoniae - MIC*    AMPICILLIN >=32 RESISTANT Resistant     CEFAZOLIN <=4 SENSITIVE Sensitive     CEFEPIME <=0.12 SENSITIVE Sensitive     CEFTAZIDIME <=1 SENSITIVE  Sensitive     CEFTRIAXONE <=0.25 SENSITIVE Sensitive     CIPROFLOXACIN <=0.25 SENSITIVE Sensitive     GENTAMICIN <=1 SENSITIVE Sensitive     IMIPENEM <=0.25 SENSITIVE Sensitive     TRIMETH/SULFA <=20 SENSITIVE Sensitive     AMPICILLIN/SULBACTAM 4 SENSITIVE Sensitive     PIP/TAZO <=4 SENSITIVE Sensitive     * KLEBSIELLA PNEUMONIAE   Proteus penneri - MIC*    AMPICILLIN >=32 RESISTANT Resistant     CEFAZOLIN >=64 RESISTANT Resistant     CEFEPIME <=0.12 SENSITIVE Sensitive     CEFTAZIDIME <=1 SENSITIVE Sensitive     CEFTRIAXONE <=0.25 SENSITIVE Sensitive     CIPROFLOXACIN <=0.25 SENSITIVE Sensitive     GENTAMICIN <=1 SENSITIVE Sensitive     IMIPENEM 2 SENSITIVE Sensitive     TRIMETH/SULFA <=20 SENSITIVE Sensitive     AMPICILLIN/SULBACTAM 16 INTERMEDIATE Intermediate     PIP/TAZO <=4 SENSITIVE Sensitive     * PROTEUS PENNERI  Blood Culture ID Panel (Reflexed)     Status: Abnormal   Collection Time: 02/24/20  2:47 AM  Result Value Ref Range Status   Enterococcus species NOT DETECTED NOT DETECTED Final   Listeria monocytogenes NOT DETECTED NOT DETECTED Final   Staphylococcus species NOT DETECTED NOT DETECTED Final   Staphylococcus aureus (BCID) NOT DETECTED NOT DETECTED Final   Streptococcus species NOT DETECTED NOT DETECTED Final   Streptococcus agalactiae NOT DETECTED NOT DETECTED Final   Streptococcus pneumoniae  NOT DETECTED NOT DETECTED Final   Streptococcus pyogenes NOT DETECTED NOT DETECTED Final   Acinetobacter baumannii NOT DETECTED NOT DETECTED Final   Enterobacteriaceae species DETECTED (A) NOT DETECTED Final    Comment: CRITICAL RESULT CALLED TO, READ BACK BY AND VERIFIED WITH: PHARMD MIHN P. [phone] FCP    Enterobacter cloacae complex NOT DETECTED NOT DETECTED Final   Escherichia coli DETECTED (A) NOT DETECTED Final    Comment: CRITICAL RESULT CALLED TO, READ BACK BY AND VERIFIED WITH: PHARMD MIHN P. [phone] FCP    Klebsiella oxytoca NOT DETECTED NOT DETECTED  Final   Klebsiella pneumoniae DETECTED (A) NOT DETECTED Final    Comment: CRITICAL RESULT CALLED TO, READ BACK BY AND VERIFIED WITH: PHARMD MIHN P. [phone] FCP    Proteus species DETECTED (A) NOT DETECTED Final    Comment: CRITICAL RESULT CALLED TO, READ BACK BY AND VERIFIED WITH: PHARMD MIHN P. [phone] FCP    Serratia marcescens NOT DETECTED NOT DETECTED Final   Carbapenem resistance NOT DETECTED NOT DETECTED Final   Haemophilus influenzae NOT DETECTED NOT DETECTED Final   Neisseria meningitidis NOT DETECTED NOT DETECTED Final   Pseudomonas aeruginosa NOT DETECTED NOT DETECTED Final   Candida albicans NOT DETECTED NOT DETECTED Final   Candida glabrata NOT DETECTED NOT DETECTED Final   Candida krusei NOT DETECTED NOT DETECTED Final   Candida parapsilosis NOT DETECTED NOT DETECTED Final   Candida tropicalis NOT DETECTED NOT DETECTED Final    Comment: Performed at [facility], [address] [place], [place] [address]  Blood culture (routine x 2)     Status: Abnormal   Collection Time: 02/24/20  3:56 AM   Specimen: BLOOD LEFT HAND  Result Value Ref Range Status   Specimen Description BLOOD LEFT HAND  Final   Special Requests   Final    BOTTLES DRAWN AEROBIC ONLY Blood Culture results may not be optimal due to an inadequate volume of blood received in culture bottles   Culture  Setup Time   Final    AEROBIC BOTTLE ONLY GRAM NEGATIVE RODS CRITICAL VALUE NOTED.  VALUE IS CONSISTENT WITH PREVIOUSLY REPORTED AND CALLED VALUE.    Culture (A)  Final    PROTEUS PENNERI SUSCEPTIBILITIES PERFORMED ON PREVIOUS CULTURE WITHIN THE LAST 5 DAYS. Performed at [facility], [place] [address], [place], [place] [address]    Report Status 02/27/2020 FINAL  Final  SARS Coronavirus 2 by RT PCR (hospital order, performed in [facility] hospital lab) Nasopharyngeal Nasopharyngeal Swab     Status: None   Collection Time: 02/24/20  6:04 AM   Specimen: Nasopharyngeal Swab  Result Value  Ref Range Status   SARS Coronavirus 2 NEGATIVE NEGATIVE Final    Comment: (NOTE) SARS-CoV-2 target nucleic acids are NOT DETECTED.  The SARS-CoV-2 RNA is generally detectable in upper and lower respiratory specimens during the acute phase of infection. The lowest concentration of SARS-CoV-2 viral copies this assay can detect is 250 copies / mL. A negative result does not preclude SARS-CoV-2 infection and should not be used as the sole basis for treatment or other Micheal Watson management decisions.  A negative result may occur with improper specimen collection / handling, submission of specimen other than nasopharyngeal swab, presence of viral mutation(s) within the areas targeted by this assay, and inadequate number of viral copies (<250 copies / mL). A negative result must be combined with clinical observations, Micheal Watson history, and epidemiological information.  Fact Sheet for Patients:  StrictlyIdeas.no  Fact Sheet for Healthcare Providers: BankingDealers.co.za  This test is not yet approved or  cleared by the [place] FDA and has been authorized for detection and/or diagnosis of SARS-CoV-2 by FDA under an Emergency Use Authorization (EUA).  This EUA will remain in effect (meaning this test can be used) for the duration of the COVID-19 declaration under Section 564(b)(1) of the Act, 21 U.S.C. section 360bbb-3(b)(1), unless the authorization is terminated or revoked sooner.  Performed at [facility], [place] [address], [place], [place] [address]   C Difficile Quick Screen w PCR reflex     Status: None   Collection Time: 02/25/20  4:21 AM   Specimen: STOOL  Result Value Ref Range Status   C Diff antigen NEGATIVE NEGATIVE Final   C Diff toxin NEGATIVE NEGATIVE Final   C Diff interpretation No C. difficile detected.  Final    Comment: Performed at [facility], [place] [address], [place], [place] [address]  Gastrointestinal  Panel by PCR , Stool     Status: None   Collection Time: 02/25/20  4:21 AM   Specimen: STOOL  Result Value Ref Range Status   Campylobacter species NOT DETECTED NOT DETECTED Final   Plesimonas shigelloides NOT DETECTED NOT DETECTED Final   Salmonella species NOT DETECTED NOT DETECTED Final   Yersinia enterocolitica NOT DETECTED NOT DETECTED Final   Vibrio species NOT DETECTED NOT DETECTED Final   Vibrio cholerae NOT DETECTED NOT DETECTED Final   Enteroaggregative E coli (EAEC) NOT DETECTED NOT DETECTED Final   Enteropathogenic E coli (EPEC) NOT DETECTED NOT DETECTED Final   Enterotoxigenic E coli (ETEC) NOT DETECTED NOT DETECTED Final   Shiga like toxin producing E coli (STEC) NOT DETECTED NOT DETECTED Final   Shigella/Enteroinvasive E coli (EIEC) NOT DETECTED NOT DETECTED Final   Cryptosporidium NOT DETECTED NOT DETECTED Final   Cyclospora cayetanensis NOT DETECTED NOT DETECTED Final   Entamoeba histolytica NOT DETECTED NOT DETECTED Final   Giardia lamblia NOT DETECTED NOT DETECTED Final   Adenovirus F40/41 NOT DETECTED NOT DETECTED Final   Astrovirus NOT DETECTED NOT DETECTED Final   Norovirus GI/GII NOT DETECTED NOT DETECTED Final   Rotavirus A NOT DETECTED NOT DETECTED Final   Sapovirus (I, II, IV, and V) NOT DETECTED NOT DETECTED Final    Comment: Performed at [facility], [place] [place], [place] [address]  MRSA PCR Screening     Status: None   Collection Time: 02/25/20  4:57 AM  Result Value Ref Range Status   MRSA by PCR NEGATIVE NEGATIVE Final    Comment:        The GeneXpert MRSA Assay (FDA approved for NASAL specimens only), is one component of a comprehensive MRSA colonization surveillance program. It is not intended to diagnose MRSA infection nor to guide or monitor treatment for MRSA infections. Performed at [facility], [place] [address] [place], [place] [address]   Gram stain     Status: None   Collection Time: 02/25/20  3:21 PM    Specimen: Abdomen  Result Value Ref Range Status   Specimen Description ABDOMEN  Final   Special Requests PERITONEAL FLUID  Final   Gram Stain   Final    ABUNDANT WBC PRESENT, PREDOMINANTLY PMN NO ORGANISMS SEEN Performed at [facility], [place] [address] [place], [place] [address]    Report Status 02/25/2020 FINAL  Final  Culture, body fluid-bottle     Status: None (  Preliminary result)   Collection Time: 02/25/20  3:21 PM   Specimen: Abdomen  Result Value Ref Range Status   Specimen Description ABDOMEN  Final   Special Requests PERITONEAL FLUID  Final   Culture   Final    NO GROWTH 4 DAYS Performed at [place] [address], [place], [place] [address]    Report Status PENDING  Incomplete    Radiology Reports CT ABDOMEN PELVIS W CONTRAST  Result Date: 02/28/2020 CLINICAL DATA:  Abdominal abscess/infection suspected Abdominal pain.  Micheal Watson had recent paracentesis. EXAM: CT ABDOMEN AND PELVIS WITH CONTRAST TECHNIQUE: Multidetector CT imaging of the abdomen and pelvis was performed using the standard protocol following bolus administration of intravenous contrast. CONTRAST:  174mL OMNIPAQUE IOHEXOL 300 MG/ML  SOLN COMPARISON:  Abdominal CT and ultrasound 4 days ago, 02/24/2020. multiple prior CT. FINDINGS: Lower chest: Calcified granuloma in the right lower lobe. Linear atelectasis in the left lower lobe. No pleural fluid. Heart is normal in size. Hepatobiliary: Cirrhotic hepatic morphology with nodular contours. The liver is enlarged spanning 26.7 cm cranial caudal. Again seen 1.2 cm low-density lesion in the inferior right lobe of the liver, series 3, image 65, unchanged from recent exam. No significant peripheral enhancement. There multiple small subcentimeter adjacent hepatic hypodensities in the right lobe extending from images 59 through 66. Subcentimeter vague hypodensity in the central right lobe series 3, image 39. Postcholecystectomy. Biliary stent remains in place,  no significant intra or extrahepatic biliary ductal dilatation. Mild pneumobilia. Pancreas: No ductal dilatation or inflammation. Spleen: No splenomegaly. Unchanged cleft posteriorly. No focal lesion. Adrenals/Urinary Tract: No adrenal nodule. No hydronephrosis or perinephric edema. Homogeneous renal enhancement with symmetric excretion on delayed phase imaging. Urinary bladder is partially distended without wall thickening. Stomach/Bowel: Patulous distal esophagus. Nondistended stomach. Normal positioning of the duodenum and ligament of Treitz. Administered enteric contrast reaches the colon. There is no bowel obstruction. No significant bowel inflammation. Appendix is air-filled and otherwise normal. Distal colonic diverticulosis without evidence of diverticulitis. Vascular/Lymphatic: Normal caliber abdominal aorta. No evidence of portal vein thrombosis. No bulky abdominopelvic adenopathy. Reproductive: Prominent prostate gland. Other: Moderate volume abdominopelvic ascites. This measures simple fluid density. There is no peripheral enhancement or loculation. No free intra-abdominal air. Mesenteric and omental edema is typical of ascites. Small umbilical hernia containing fat and small amount of ascites. Mild skin thickening adjacent to the umbilical hernia in the midline. Fat in both inguinal canals. Musculoskeletal: There are no acute or suspicious osseous abnormalities. Peripherally sclerotic lesion in the left femoral head is unchanged from prior exams. 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 IMPRESSION: 1. Stable low-density lesions in the right lobe of the liver, largest measuring 1.2 cm, suspicious for small abscesses. No significant progression or convalescence. Vague subcentimeter structure in the central liver was not definitively seen on prior exam, unclear if this represents a new area of infection  or better seen currently due to differences in phase of contrast. 2. Moderate volume abdominopelvic ascites. There is no peripheral enhancement or loculation. 3. Cirrhosis. Biliary stent remains in place, no significant intra or extrahepatic biliary ductal dilatation. 4. Colonic diverticulosis without diverticulitis. 5. Small umbilical hernia containing fat and small amount of ascites. Mild skin thickening adjacent to the umbilical hernia in the midline. 6. Additional stable chronic findings as described. Electronically Signed   By: Keith Rake M.D.   On: 02/28/2020 15:25   CT ABDOMEN PELVIS W  CONTRAST  Result Date: 02/24/2020 CLINICAL DATA:  Generalized abdominal pain, swelling, and jaundice with vomiting and fever. Leukocytosis. EXAM: CT ABDOMEN AND PELVIS WITH CONTRAST TECHNIQUE: Multidetector CT imaging of the abdomen and pelvis was performed using the standard protocol following bolus administration of intravenous contrast. CONTRAST:  132mL OMNIPAQUE IOHEXOL 300 MG/ML  SOLN COMPARISON:  12/24/2019 FINDINGS: Lower chest: Punctate calcified nodules in the right middle lobe and right lower lobe. No basilar lung consolidation or pleural effusion. Hepatobiliary: There is a new 1.2 cm hypodense lesion inferiorly in the right hepatic lobe (series 3, image 63), and there are additional more subtle subcentimeter hypodensities in the inferior right hepatic lobe which also appear new (for example series 3, images 56, 58, 59, and 62). Status post cholecystectomy. A biliary stent remains in place without significant dilatation of the common bile duct, however there is increased dilatation of the common hepatic duct about the proximal aspect of the stent measuring 2.1 cm in diameter. There is at most minimal central intrahepatic biliary dilatation. Pancreas: Unremarkable. Spleen: No focal lesion. Unchanged cleft along the posterior splenic margin. Adrenals/Urinary Tract: Unremarkable adrenal glands. No evidence of  renal mass, calculi, or hydronephrosis. Unremarkable bladder. Stomach/Bowel: The stomach is unremarkable. There is no evidence of bowel obstruction. Mildly prominent gaseous distension of the appendix without evidence of appendicitis. Vascular/Lymphatic: No significant vascular findings are present. No enlarged abdominal or pelvic lymph nodes. Reproductive: Mildly enlarged prostate. Other: Increased small to moderate volume ascites. No loculated fluid collection. No pneumoperitoneum. Small umbilical hernia containing fat and fluid. Musculoskeletal: No acute osseous abnormality or suspicious osseous lesion. IMPRESSION: 1. New small ill-defined hypodensities inferiorly in the right hepatic lobe, nonspecific though infection/small abscesses are a concern with this history. 2. Increased small to moderate volume ascites. 3. Unchanged biliary stent with increased dilatation of the common hepatic duct. 4. Aortic Atherosclerosis (ICD10-I70.0). Electronically Signed   By: Logan Bores M.D.   On: 02/24/2020 05:42   DG Chest Port 1 View  Result Date: 02/27/2020 CLINICAL DATA:  Shortness of breath.  Sepsis. EXAM: PORTABLE CHEST 1 VIEW COMPARISON:  12/03/2019 FINDINGS: Lordotic technique is demonstrated. Lungs are adequately inflated without focal airspace consolidation or effusion. Borderline stable cardiomegaly. Remainder of the exam is unchanged. IMPRESSION: No acute cardiopulmonary disease. Electronically Signed   By: Marin Olp M.D.   On: 02/27/2020 09:13   DG ERCP BILIARY & PANCREATIC DUCTS  Result Date: 02/24/2020 CLINICAL DATA:  ERCP. EXAM: ERCP TECHNIQUE: Multiple spot images obtained with the fluoroscopic device and submitted for interpretation post-procedure. FLUOROSCOPY TIME:  Fluoroscopy Time:  2 minutes and 13 seconds Radiation Exposure Index (if provided by the fluoroscopic device): 66.82 mGy Number of Acquired Spot Images: 6 COMPARISON:  CT dated February 24, 2020.  11/29/2019 FINDINGS: And insult spot  images demonstrate a common bile duct stent. The stent has been removed. Injection of contrast followed by balloon sweep demonstrates a persistent narrowing of the mid and distal common bile duct. There appears to be some mild intrahepatic biliary ductal dilatation. The Micheal Watson is status post prior cholecystectomy. Final images demonstrate replacement of the plastic biliary stent. IMPRESSION: Status post ERCP as detailed above. These images were submitted for radiologic interpretation only. Please see the procedural report for the amount of contrast and the fluoroscopy time utilized. Electronically Signed   By: Constance Holster M.D.   On: 02/24/2020 18:49   US Abdomen Limited RUQ  Result Date: 02/24/2020 CLINICAL DATA:  Jaundice EXAM: ULTRASOUND ABDOMEN LIMITED RIGHT UPPER QUADRANT  COMPARISON:  12/24/2019 FINDINGS: Gallbladder: Surgically absent Common bile duct: Diameter: 8 mm Liver: Nodularity of the liver capsule compatible with cirrhosis. No focal parenchymal abnormality. No intrahepatic duct dilation. Portal vein is patent on color Doppler imaging with normal direction of blood flow towards the liver. Other: There is a small amount of ascites throughout the abdomen, greatest in the lower quadrants. IMPRESSION: 1. Small volume ascites. 2. Findings consistent with hepatic cirrhosis. 3. Cholecystectomy. Electronically Signed   By: Randa Ngo M.D.   On: 02/24/2020 03:23   IR Paracentesis  Result Date: 02/28/2020 INDICATION: Abdominal pain, distention. Recurrent ascites. Request for therapeutic paracentesis. EXAM: ULTRASOUND GUIDED LEFT LOWER QUADRANT PARACENTESIS MEDICATIONS: None. COMPLICATIONS: None immediate. PROCEDURE: Informed written consent was obtained from the Micheal Watson after a discussion of the risks, benefits and alternatives to treatment. A timeout was performed prior to the initiation of the procedure. Initial ultrasound scanning demonstrates a small to moderate amount of ascites within the  left lower abdominal quadrant. The left lower abdomen was prepped and draped in the usual sterile fashion. 1% lidocaine was used for local anesthesia. Following this, a 19 gauge, 7-cm, Yueh catheter was introduced. An ultrasound image was saved for documentation purposes. The paracentesis was performed. The catheter was removed and a dressing was applied. The Micheal Watson tolerated the procedure well without immediate post procedural complication. FINDINGS: A total of approximately 1.9 L of clear yellow fluid was removed. IMPRESSION: Successful ultrasound-guided paracentesis yielding 1.9 liters of peritoneal fluid. Read by: Ascencion Dike PA-C Electronically Signed   By: Corrie Mckusick D.O.   On: 02/28/2020 16:23   IR Paracentesis  Result Date: 02/25/2020 INDICATION: Micheal Watson with a history of alcoholic hepatitis and ascites presents today for a therapeutic and diagnostic paracentesis. EXAM: ULTRASOUND GUIDED PARACENTESIS MEDICATIONS: 1% lidocaine 10 mL COMPLICATIONS: None immediate PROCEDURE: Informed written consent was obtained from the Micheal Watson after a discussion of the risks, benefits and alternatives to treatment. A timeout was performed prior to the initiation of the procedure. Initial ultrasound scanning demonstrates a large amount of ascites within the left lower abdominal quadrant. The left lower abdomen was prepped and draped in the usual sterile fashion. 1% lidocaine was used for local anesthesia. Following this, a 19 gauge, 7-cm, Yueh catheter was introduced. An ultrasound image was saved for documentation purposes. The paracentesis was performed. The catheter was removed and a dressing was applied. The Micheal Watson tolerated the procedure well without immediate post procedural complication. FINDINGS: A total of approximately 2.2 L of dark yellow fluid was removed. Samples were sent to the laboratory as requested by the clinical team. IMPRESSION: Successful ultrasound-guided paracentesis yielding 2.2 liters of  peritoneal fluid. Read by: Soyla Dryer, NP Electronically Signed   By: Lucrezia Europe M.D.   On: 02/25/2020 14:28      Phillips Climes M.D on 03/03/2020 at 3:10 PM  To page go to [email or web address]

## 2020-03-03 NOTE — Progress Notes (Signed)
The patient is doing well and he denies any abdominal pain.  I reviewed his prior nuclear medicine biliary scans and neither one of the 2 showed an actual bile leak.  Also his 2 ERCPs in the past did not show a bile leak.  There is mention in the note that bile leak was suspected however in reviewing the data I do not see any confirmation that there actually ever was a bile leak.  He does have a stenosis in the biliary duct and that is why he has a biliary stent in place.  I discussed this with Dr. Watt Climes and he can follow-up with the patient to see what further will need to be done over the weekend.  I suspect that an EUS would be an appropriate next step.  In regards to his cirrhosis of the liver and ascites continue diuretic therapy.

## 2020-03-04 DIAGNOSIS — K7011 Alcoholic hepatitis with ascites: Secondary | ICD-10-CM

## 2020-03-04 LAB — COMPREHENSIVE METABOLIC PANEL
ALT: 29 U/L (ref 0–44)
AST: 47 U/L — ABNORMAL HIGH (ref 15–41)
Albumin: 2.6 g/dL — ABNORMAL LOW (ref 3.5–5.0)
Alkaline Phosphatase: 121 U/L (ref 38–126)
Anion gap: 10 (ref 5–15)
BUN: 10 mg/dL (ref 6–20)
CO2: 21 mmol/L — ABNORMAL LOW (ref 22–32)
Calcium: 8.7 mg/dL — ABNORMAL LOW (ref 8.9–10.3)
Chloride: 103 mmol/L (ref 98–111)
Creatinine, Ser: 0.88 mg/dL (ref 0.61–1.24)
GFR calc Af Amer: >60 mL/min (ref >60)
GFR calc non Af Amer: >60 mL/min (ref >60)
Glucose, Bld: 118 mg/dL — ABNORMAL HIGH (ref 70–99)
Potassium: 4.4 mmol/L (ref 3.5–5.1)
Sodium: 134 mmol/L — ABNORMAL LOW (ref 135–145)
Total Bilirubin: 2.6 mg/dL — ABNORMAL HIGH (ref 0.3–1.2)
Total Protein: 7 g/dL (ref 6.5–8.1)

## 2020-03-04 LAB — CULTURE, BODY FLUID W GRAM STAIN -BOTTLE: Culture: NO GROWTH

## 2020-03-04 LAB — PROTIME-INR
INR: 1.1 (ref 0.8–1.2)
Prothrombin Time: 14 seconds (ref 11.4–15.2)

## 2020-03-04 LAB — PROCALCITONIN: Procalcitonin: 0.18 ng/mL

## 2020-03-04 LAB — AMMONIA: Ammonia: 33 umol/L (ref 9–35)

## 2020-03-04 NOTE — Progress Notes (Addendum)
Received from 2w. Alert and oriented, not in any distress, abdomen distended. Oriented to unit and staff. Will monitor.

## 2020-03-04 NOTE — Progress Notes (Signed)
PROGRESS NOTE                                                                                                                                                                                                             Patient Demographics:    Micheal Watson, is a 59 y.o. male, DOB - Feb 07, 1961, YKZ:[id number]  Admit date - 02/23/2020   Admitting Physician Karmen Bongo, MD  Outpatient Primary MD for the patient is Patient, No Pcp Per  LOS - 9  Chief Complaint  Patient presents with  . Abdominal Pain  . Jaundice       Brief Narrative  - Micheal Watson is a 59 y.o. male with medical history significant of ETOH dependence; recent acute cholecystitis/cholangitis s/p CBD stent and JP drain placement (removed since).  He is now presenting with recurrent LLQ abdomnial pain.   Subjective:   Patient in bed, appears comfortable, denies any headache, no fever, no chest pain or pressure, no shortness of breath , he denies any abdominal pain.   Assessment  & Plan :     Sepsis from SBP following intra-abdominal surgery with current hyperbilirubinemia and obstructed CBD stent along with small Hepatic abscess - in a patient with following history  -Hx acute cholecystitis with hydrops; laparoscopic cholecystectomy with postoperative bile leak 11/22/2019 Dr. 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 Clarene Essex -Diagnostic laparoscopy, evacuation of bilious ascites(3L),JP drain placement 12/07/2019 Dr. Donne Hazel -Drain removal 12/23/2019 - ERCP by Dr. Paulita Fujita on 02/24/2020 with CBD biliary stent stricture, stent removal and new stent placed.  Biopsy sent. -Ultrasound-guided paracentesis on 02/24/2021 with 2.2 L of fluid removed - SBP -Another ultrasound-guided paracentesis 7/26 with one-point liters of fluid drained, remains significant for SBP. -Repeat CT scan of abdomen pelvis ordered on 02/28/2020 -shows liver abscesses with largest one being  1.1 cm.  IR has been consulted, collections are too small to drain. -Continue with diuresis, he is on Lasix 20 mg daily, will increase Aldactone to 50 mg, monitor renal function and potassium closely.   Sepsis due to polymicrobial gram-negative bacteremia caused by SBP/ ? Hepatic abscesses, E. coli, Klebsiella and Proteus growing bacteremia.  Abdominal fluid cultures consistent with SBP.  Both GI and general surgery have been consulted.  He is currently on Rocephin and Flagyl and sepsis pathophysiology is improving.  He still having some right upper quadrant discomfort and repeat CT scan suggests a 1.1 cm liver abscess with multiple small foci, discussed with ID  about antibiotic recommendation, recommendation for Augmentin on discharge for another 2 weeks.  For now we will continue with IV antibiotics given he still having significant abdominal tenderness to palpation, and recurrent ascites. -Per GI has been started on diuretics for his recurrent ascites.  So far he has had 2 ultrasound-guided paracentesis with total 4 L of fluid removed and consistent with SBP.  Blood cultures with no growth to date   Recent Labs  Lab 02/27/20 0505 02/27/20 0505 02/28/20 0401 02/29/20 0344 03/01/20 0836 03/02/20 0336 03/04/20 0854  WBC 10.7*  --  12.2* 14.9* 12.1* 10.6*  --   PLT 299  --  311 302 357 326  --   CRP 4.4*  --  3.6* 3.3* 3.8* 1.7*  --   PROCALCITON 2.52   < > 2.52 1.01 0.50 0.37 0.18  LATICACIDVEN 2.5*  --   --   --   --   --   --    < > = values in this interval not displayed.    HTN -blood pressure stable off of medications.  HLD - He was prescribed Lipitor prior but has not been taking it.  ETOH dependence - ongoing abuse, no signs of withdrawal on CIWA protocol.  Patient reports he drinks 40 ounce of beer every night .  h/o COVID-19 infection in April 2020  -  Resolved.    Condition - Extremely Guarded  Family Communication  :  None at bedside  Code Status :   Full  Consults  :  GI, CCS  Procedures  :    CT Repeat - Non acute - 1. Stable low-density lesions in the right lobe of the liver, largest measuring 1.2 cm, suspicious for small abscesses. No significant progression or convalescence. Vague subcentimeter structure in the central liver was not definitively seen on prior exam, unclear if this represents a new area of infection or better seen currently due to differences in phase of contrast. 2. Moderate volume abdominopelvic ascites. There is no peripheral enhancement or loculation. 3. Cirrhosis. Biliary stent remains in place, no significant intra or extrahepatic biliary ductal dilatation. 4. Colonic diverticulosis without diverticulitis. 5. Small umbilical hernia containing fat and small amount of ascites. Mild skin thickening adjacent to the umbilical hernia in the midline.   Ultrasound-guided paracentesis.  SBP.    CT -  1. New small ill-defined hypodensities inferiorly in the right hepatic lobe, nonspecific though infection/small abscesses are a concern with this history. 2. Increased small to moderate volume ascites. 3. Unchanged biliary stent with increased dilatation of the common hepatic duct. 4. Aortic Atherosclerosis  ERCP - with CBD stricture at the site of her stent, stent removed and new temporary stent placed on 02/24/2020 by Dr. Paulita Fujita.  PUD Prophylaxis : PPI  Disposition Plan  :    Status is: Inpatient  Remains inpatient appropriate because:IV treatments appropriate due to intensity of illness or inability to take PO   Dispo: The patient is from: Home              Anticipated d/c is to: Home              Anticipated d/c date is: 3 days              Patient currently is not medically stable to d/c.   DVT Prophylaxis  : Heparin    Lab Results  Component Value Date   PLT 326 03/02/2020    Diet :  Diet Order  DIET SOFT Room service appropriate? Yes; Fluid consistency: Thin  Diet effective now                   Inpatient Medications Scheduled Meds: . enoxaparin (LOVENOX) injection  50 mg Subcutaneous Q24H  . folic acid  1 mg Oral Daily  . furosemide  20 mg Oral Daily  . multivitamin with minerals  1 tablet Oral Daily  . pantoprazole  40 mg Oral Daily  . sodium chloride flush  3 mL Intravenous Q12H  . spironolactone  50 mg Oral Daily  . thiamine  100 mg Oral Daily   Or  . thiamine  100 mg Intravenous Daily   Continuous Infusions: . sodium chloride 10 mL/hr at 03/03/20 1017  . albumin human    . cefTRIAXone (ROCEPHIN)  IV 2 g (03/04/20 0022)  . metronidazole 500 mg (03/04/20 1242)   PRN Meds:.sodium chloride, albumin human, HYDROcodone-acetaminophen, lidocaine, lidocaine, [DISCONTINUED] ondansetron **OR** ondansetron (ZOFRAN) IV  Antibiotics  :   Anti-infectives (From admission, onward)   Start     Dose/Rate Route Frequency Ordered Stop   03/03/20 2200  cefTRIAXone (ROCEPHIN) 2 g in sodium chloride 0.9 % 100 mL IVPB     Discontinue     2 g 200 mL/hr over 30 Minutes Intravenous Every 24 hours 03/03/20 1010     03/03/20 1200  metroNIDAZOLE (FLAGYL) IVPB 500 mg     Discontinue     500 mg 100 mL/hr over 60 Minutes Intravenous Every 8 hours 03/03/20 1010     03/03/20 1000  amoxicillin-clavulanate (AUGMENTIN) 875-125 MG per tablet 1 tablet  Status:  Discontinued        1 tablet Oral Every 12 hours 03/03/20 0735 03/03/20 1010   02/25/20 2000  cefTRIAXone (ROCEPHIN) 2 g in sodium chloride 0.9 % 100 mL IVPB  Status:  Discontinued        2 g 200 mL/hr over 30 Minutes Intravenous Every 24 hours 02/24/20 1847 03/03/20 0735   02/25/20 1515  metroNIDAZOLE (FLAGYL) IVPB 500 mg  Status:  Discontinued        500 mg 100 mL/hr over 60 Minutes Intravenous Every 8 hours 02/25/20 1455 03/03/20 0735   02/24/20 1830  cefTRIAXone (ROCEPHIN) 2 g in sodium chloride 0.9 % 100 mL IVPB  Status:  Discontinued        2 g 200 mL/hr over 30 Minutes Intravenous Every 24 hours 02/24/20 1741 02/24/20 1847    02/24/20 1200  piperacillin-tazobactam (ZOSYN) IVPB 3.375 g  Status:  Discontinued        3.375 g 100 mL/hr over 30 Minutes Intravenous Every 6 hours 02/24/20 0934 02/24/20 1009   02/24/20 1200  piperacillin-tazobactam (ZOSYN) IVPB 3.375 g  Status:  Discontinued        3.375 g 12.5 mL/hr over 240 Minutes Intravenous Every 8 hours 02/24/20 1009 02/24/20 1741   02/24/20 0245  piperacillin-tazobactam (ZOSYN) IVPB 3.375 g        3.375 g 100 mL/hr over 30 Minutes Intravenous  Once 02/24/20 0237 02/24/20 0535          Objective:   Vitals:   03/04/20 0452 03/04/20 0804 03/04/20 0805 03/04/20 1318  BP: (!) 134/83 (!) 122/91  (!) 138/92  Pulse: 82 83 87 78  Resp: 18 20 20 18   Temp: 98.6 F (37 C) 98.4 F (36.9 C)  98.9 F (37.2 C)  TempSrc: Oral Oral  Oral  SpO2: 95% (!) 89% 93% 98%  Weight:  Height:        SpO2: 98 % O2 Flow Rate (L/min): 6 L/min  Wt Readings from Last 3 Encounters:  03/03/20 91.8 kg  12/24/19 88.2 kg  11/28/19 96.4 kg     Intake/Output Summary (Last 24 hours) at 03/04/2020 1612 Last data filed at 03/04/2020 1300 Gross per 24 hour  Intake 860 ml  Output 1375 ml  Net -515 ml     Physical Exam  Awake Alert, Oriented X 3, No new F.N deficits, Normal affect Symmetrical Chest wall movement, Good air movement bilaterally, CTAB RRR,No Gallops,Rubs or new Murmurs, No Parasternal Heave +ve B.Sounds, Abd Soft, patient with ascites, mild, remains tender to palpation, no rebound - guarding or rigidity. No Cyanosis, Clubbing or edema, No new Rash or bruise      Data Review:    Recent Labs  Lab 02/27/20 0505 02/28/20 0401 02/29/20 0344 03/01/20 0836 03/02/20 0336  WBC 10.7* 12.2* 14.9* 12.1* 10.6*  HGB 14.6 14.9 14.2 14.8 13.9  HCT 42.1 43.4 41.2 43.6 41.6  PLT 299 311 302 357 326  MCV 93.8 93.9 93.8 95.6 96.3  MCH 32.5 32.3 32.3 32.5 32.2  MCHC 34.7 34.3 34.5 33.9 33.4  RDW 17.2* 17.1* 16.6* 16.5* 16.1*  LYMPHSABS 2.3 2.7 1.8 2.5 2.3   MONOABS 0.9 1.1* 1.1* 1.2* 1.1*  EOSABS 0.0 0.1 0.1 0.1 0.1  BASOSABS 0.0 0.0 0.1 0.1 0.1    Recent Labs  Lab 02/27/20 0505 02/27/20 0505 02/28/20 0401 02/28/20 0401 02/29/20 0344 03/01/20 0836 03/02/20 0336 03/03/20 1039 03/04/20 0854  NA 135   < > 136   < > 132* 134* 136 134* 134*  K 3.8   < > 3.7   < > 3.8 3.9 4.0 4.2 4.4  CL 104   < > 102   < > 100 102 102 100 103  CO2 22   < > 25   < > 25 23 25 24  21*  GLUCOSE 125*   < > 108*   < > 103* 139* 109* 110* 118*  BUN 15   < > 15   < > 9 8 10 9 10   CREATININE 0.85   < > 1.01   < > 0.90 0.90 0.93 0.95 0.88  CALCIUM 7.9*   < > 7.9*   < > 7.8* 8.3* 8.5* 8.5* 8.7*  AST 78*   < > 62*   < > 70* 58* 47* 45* 47*  ALT 47*   < > 44   < > 45* 39 34 32 29  ALKPHOS 122   < > 110   < > 104 112 112 112 121  BILITOT 4.1*   < > 3.3*   < > 3.1* 2.8* 2.4* 2.3* 2.6*  ALBUMIN 2.0*   < > 2.0*   < > 2.0* 2.2* 2.1* 2.4* 2.6*  MG 2.0  --  1.6*  --  1.8 1.7 1.7  --   --   CRP 4.4*  --  3.6*  --  3.3* 3.8* 1.7*  --   --   PROCALCITON 2.52   < > 2.52  --  1.01 0.50 0.37  --  0.18  INR 1.3*   < > 1.2  --  1.3* 1.2 1.2  --  1.1  AMMONIA  --   --   --   --   --   --   --   --  33  BNP 645.6*  --  133.2*  --  62.9  98.0 102.7*  --   --    < > = values in this interval not displayed.    Recent Labs  Lab 02/27/20 0505 02/27/20 0505 02/28/20 0401 02/29/20 0344 03/01/20 0836 03/02/20 0336 03/04/20 0854  CRP 4.4*  --  3.6* 3.3* 3.8* 1.7*  --   BNP 645.6*  --  133.2* 62.9 98.0 102.7*  --   PROCALCITON 2.52   < > 2.52 1.01 0.50 0.37 0.18   < > = values in this interval not displayed.    ------------------------------------------------------------------------------------------------------------------ No results for input(s): CHOL, HDL, LDLCALC, TRIG, CHOLHDL, LDLDIRECT in the last 72 hours.  Lab Results  Component Value Date   HGBA1C 6.0 (H) 04/10/2019    ------------------------------------------------------------------------------------------------------------------ No results for input(s): TSH, T4TOTAL, T3FREE, THYROIDAB in the last 72 hours.  Invalid input(s): FREET3 ------------------------------------------------------------------------------------------------------------------ No results for input(s): VITAMINB12, FOLATE, FERRITIN, TIBC, IRON, RETICCTPCT in the last 72 hours.  Coagulation profile Recent Labs  Lab 02/28/20 0401 02/29/20 0344 03/01/20 0836 03/02/20 0336 03/04/20 0854  INR 1.2 1.3* 1.2 1.2 1.1    No results for input(s): DDIMER in the last 72 hours.  Cardiac Enzymes No results for input(s): CKMB, TROPONINI, MYOGLOBIN in the last 168 hours.  Invalid input(s): CK ------------------------------------------------------------------------------------------------------------------    Component Value Date/Time   BNP 102.7 (H) 03/02/2020 0336    Micro Results Recent Results (from the past 240 hour(s))  Blood culture (routine x 2)     Status: Abnormal   Collection Time: 02/24/20  2:47 AM   Specimen: BLOOD  Result Value Ref Range Status   Specimen Description BLOOD LEFT ANTECUBITAL  Final   Special Requests   Final    BOTTLES DRAWN AEROBIC AND ANAEROBIC Blood Culture adequate volume   Culture  Setup Time   Final    GRAM NEGATIVE RODS IN BOTH AEROBIC AND ANAEROBIC BOTTLES CRITICAL RESULT CALLED TO, READ BACK BY AND VERIFIED WITH: PHARMD MIHN P. [phone] FCP Performed at [facility], [place] [address] [place], [place] [address]    Culture (A)  Final    ESCHERICHIA COLI KLEBSIELLA PNEUMONIAE PROTEUS PENNERI    Report Status 02/27/2020 FINAL  Final   Organism ID, Bacteria ESCHERICHIA COLI  Final   Organism ID, Bacteria KLEBSIELLA PNEUMONIAE  Final   Organism ID, Bacteria PROTEUS PENNERI  Final      Susceptibility   Escherichia coli - MIC*    AMPICILLIN <=2 SENSITIVE Sensitive     CEFAZOLIN <=4  SENSITIVE Sensitive     CEFEPIME <=0.12 SENSITIVE Sensitive     CEFTAZIDIME <=1 SENSITIVE Sensitive     CEFTRIAXONE <=0.25 SENSITIVE Sensitive     CIPROFLOXACIN <=0.25 SENSITIVE Sensitive     GENTAMICIN <=1 SENSITIVE Sensitive     IMIPENEM <=0.25 SENSITIVE Sensitive     TRIMETH/SULFA <=20 SENSITIVE Sensitive     AMPICILLIN/SULBACTAM <=2 SENSITIVE Sensitive     PIP/TAZO <=4 SENSITIVE Sensitive     * ESCHERICHIA COLI   Klebsiella pneumoniae - MIC*    AMPICILLIN >=32 RESISTANT Resistant     CEFAZOLIN <=4 SENSITIVE Sensitive     CEFEPIME <=0.12 SENSITIVE Sensitive     CEFTAZIDIME <=1 SENSITIVE Sensitive     CEFTRIAXONE <=0.25 SENSITIVE Sensitive     CIPROFLOXACIN <=0.25 SENSITIVE Sensitive     GENTAMICIN <=1 SENSITIVE Sensitive     IMIPENEM <=0.25 SENSITIVE Sensitive     TRIMETH/SULFA <=20 SENSITIVE Sensitive     AMPICILLIN/SULBACTAM 4 SENSITIVE Sensitive     PIP/TAZO <=4 SENSITIVE Sensitive     *  KLEBSIELLA PNEUMONIAE   Proteus penneri - MIC*    AMPICILLIN >=32 RESISTANT Resistant     CEFAZOLIN >=64 RESISTANT Resistant     CEFEPIME <=0.12 SENSITIVE Sensitive     CEFTAZIDIME <=1 SENSITIVE Sensitive     CEFTRIAXONE <=0.25 SENSITIVE Sensitive     CIPROFLOXACIN <=0.25 SENSITIVE Sensitive     GENTAMICIN <=1 SENSITIVE Sensitive     IMIPENEM 2 SENSITIVE Sensitive     TRIMETH/SULFA <=20 SENSITIVE Sensitive     AMPICILLIN/SULBACTAM 16 INTERMEDIATE Intermediate     PIP/TAZO <=4 SENSITIVE Sensitive     * PROTEUS PENNERI  Blood Culture ID Panel (Reflexed)     Status: Abnormal   Collection Time: 02/24/20  2:47 AM  Result Value Ref Range Status   Enterococcus species NOT DETECTED NOT DETECTED Final   Listeria monocytogenes NOT DETECTED NOT DETECTED Final   Staphylococcus species NOT DETECTED NOT DETECTED Final   Staphylococcus aureus (BCID) NOT DETECTED NOT DETECTED Final   Streptococcus species NOT DETECTED NOT DETECTED Final   Streptococcus agalactiae NOT DETECTED NOT DETECTED Final    Streptococcus pneumoniae NOT DETECTED NOT DETECTED Final   Streptococcus pyogenes NOT DETECTED NOT DETECTED Final   Acinetobacter baumannii NOT DETECTED NOT DETECTED Final   Enterobacteriaceae species DETECTED (A) NOT DETECTED Final    Comment: CRITICAL RESULT CALLED TO, READ BACK BY AND VERIFIED WITH: PHARMD MIHN P. [phone] FCP    Enterobacter cloacae complex NOT DETECTED NOT DETECTED Final   Escherichia coli DETECTED (A) NOT DETECTED Final    Comment: CRITICAL RESULT CALLED TO, READ BACK BY AND VERIFIED WITH: PHARMD MIHN P. [phone] FCP    Klebsiella oxytoca NOT DETECTED NOT DETECTED Final   Klebsiella pneumoniae DETECTED (A) NOT DETECTED Final    Comment: CRITICAL RESULT CALLED TO, READ BACK BY AND VERIFIED WITH: PHARMD MIHN P. [id number] FCP    Proteus species DETECTED (A) NOT DETECTED Final    Comment: CRITICAL RESULT CALLED TO, READ BACK BY AND VERIFIED WITH: PHARMD MIHN P. [phone] FCP    Serratia marcescens NOT DETECTED NOT DETECTED Final   Carbapenem resistance NOT DETECTED NOT DETECTED Final   Haemophilus influenzae NOT DETECTED NOT DETECTED Final   Neisseria meningitidis NOT DETECTED NOT DETECTED Final   Pseudomonas aeruginosa NOT DETECTED NOT DETECTED Final   Candida albicans NOT DETECTED NOT DETECTED Final   Candida glabrata NOT DETECTED NOT DETECTED Final   Candida krusei NOT DETECTED NOT DETECTED Final   Candida parapsilosis NOT DETECTED NOT DETECTED Final   Candida tropicalis NOT DETECTED NOT DETECTED Final    Comment: Performed at [facility], [place]. [address] [place], [place] [address]  Blood culture (routine x 2)     Status: Abnormal   Collection Time: 02/24/20  3:56 AM   Specimen: BLOOD LEFT HAND  Result Value Ref Range Status   Specimen Description BLOOD LEFT HAND  Final   Special Requests   Final    BOTTLES DRAWN AEROBIC ONLY Blood Culture results may not be optimal due to an inadequate volume of blood received in culture bottles   Culture   Setup Time   Final    AEROBIC BOTTLE ONLY GRAM NEGATIVE RODS CRITICAL VALUE NOTED.  VALUE IS CONSISTENT WITH PREVIOUSLY REPORTED AND CALLED VALUE.    Culture (A)  Final    PROTEUS PENNERI SUSCEPTIBILITIES PERFORMED ON PREVIOUS CULTURE WITHIN THE LAST 5 DAYS. Performed at [facility], [place] [address], [place], [place] [address]  Report Status 02/27/2020 FINAL  Final  SARS Coronavirus 2 by RT PCR (hospital order, performed in [facility] hospital lab) Nasopharyngeal Nasopharyngeal Swab     Status: None   Collection Time: 02/24/20  6:04 AM   Specimen: Nasopharyngeal Swab  Result Value Ref Range Status   SARS Coronavirus 2 NEGATIVE NEGATIVE Final    Comment: (NOTE) SARS-CoV-2 target nucleic acids are NOT DETECTED.  The SARS-CoV-2 RNA is generally detectable in upper and lower respiratory specimens during the acute phase of infection. The lowest concentration of SARS-CoV-2 viral copies this assay can detect is 250 copies / mL. A negative result does not preclude SARS-CoV-2 infection and should not be used as the sole basis for treatment or other patient management decisions.  A negative result may occur with improper specimen collection / handling, submission of specimen other than nasopharyngeal swab, presence of viral mutation(s) within the areas targeted by this assay, and inadequate number of viral copies (<250 copies / mL). A negative result must be combined with clinical observations, patient history, and epidemiological information.  Fact Sheet for Patients:   StrictlyIdeas.no  Fact Sheet for Healthcare Providers: BankingDealers.co.za  This test is not yet approved or  cleared by the [place] FDA and has been authorized for detection and/or diagnosis of SARS-CoV-2 by FDA under an Emergency Use Authorization (EUA).  This EUA will remain in effect (meaning this test can be used) for the duration of the COVID-19  declaration under Section 564(b)(1) of the Act, 21 U.S.C. section 360bbb-3(b)(1), unless the authorization is terminated or revoked sooner.  Performed at [facility], [place] [address], [place], [place] [address]   C Difficile Quick Screen w PCR reflex     Status: None   Collection Time: 02/25/20  4:21 AM   Specimen: STOOL  Result Value Ref Range Status   C Diff antigen NEGATIVE NEGATIVE Final   C Diff toxin NEGATIVE NEGATIVE Final   C Diff interpretation No C. difficile detected.  Final    Comment: Performed at [facility], [place] [address], [place], [place] [address]  Gastrointestinal Panel by PCR , Stool     Status: None   Collection Time: 02/25/20  4:21 AM   Specimen: STOOL  Result Value Ref Range Status   Campylobacter species NOT DETECTED NOT DETECTED Final   Plesimonas shigelloides NOT DETECTED NOT DETECTED Final   Salmonella species NOT DETECTED NOT DETECTED Final   Yersinia enterocolitica NOT DETECTED NOT DETECTED Final   Vibrio species NOT DETECTED NOT DETECTED Final   Vibrio cholerae NOT DETECTED NOT DETECTED Final   Enteroaggregative E coli (EAEC) NOT DETECTED NOT DETECTED Final   Enteropathogenic E coli (EPEC) NOT DETECTED NOT DETECTED Final   Enterotoxigenic E coli (ETEC) NOT DETECTED NOT DETECTED Final   Shiga like toxin producing E coli (STEC) NOT DETECTED NOT DETECTED Final   Shigella/Enteroinvasive E coli (EIEC) NOT DETECTED NOT DETECTED Final   Cryptosporidium NOT DETECTED NOT DETECTED Final   Cyclospora cayetanensis NOT DETECTED NOT DETECTED Final   Entamoeba histolytica NOT DETECTED NOT DETECTED Final   Giardia lamblia NOT DETECTED NOT DETECTED Final   Adenovirus F40/41 NOT DETECTED NOT DETECTED Final   Astrovirus NOT DETECTED NOT DETECTED Final   Norovirus GI/GII NOT DETECTED NOT DETECTED Final   Rotavirus A NOT DETECTED NOT DETECTED Final   Sapovirus (I, II, IV, and V) NOT DETECTED NOT DETECTED Final    Comment: Performed at [facility], [place],  [place], [place] [address]  MRSA PCR Screening     Status: None   Collection Time: 02/25/20  4:57 AM  Result Value Ref Range Status   MRSA by PCR NEGATIVE NEGATIVE Final    Comment:        The GeneXpert MRSA Assay (FDA approved for NASAL specimens only), is one component of a comprehensive MRSA colonization surveillance program. It is not intended to diagnose MRSA infection nor to guide or monitor treatment for MRSA infections. Performed at [facility], [place] [address] [place], [place] [address]   Gram stain     Status: None   Collection Time: 02/25/20  3:21 PM   Specimen: Abdomen  Result Value Ref Range Status   Specimen Description ABDOMEN  Final   Special Requests PERITONEAL FLUID  Final   Gram Stain   Final    ABUNDANT WBC PRESENT, PREDOMINANTLY PMN NO ORGANISMS SEEN Performed at [facility], [place] [address] [place], [place] [address]    Report Status 02/25/2020 FINAL  Final  Culture, body fluid-bottle     Status: None   Collection Time: 02/25/20  3:21 PM   Specimen: Abdomen  Result Value Ref Range Status   Specimen Description ABDOMEN  Final   Special Requests PERITONEAL FLUID  Final   Culture   Final    NO GROWTH 8 DAYS Performed at [place] [address], [place], [place] [address]    Report Status 03/04/2020 FINAL  Final    Radiology Reports CT ABDOMEN PELVIS W CONTRAST  Result Date: 02/28/2020 CLINICAL DATA:  Abdominal abscess/infection suspected Abdominal pain.  Patient had recent paracentesis. EXAM: CT ABDOMEN AND PELVIS WITH CONTRAST TECHNIQUE: Multidetector CT imaging of the abdomen and pelvis was performed using the standard protocol following bolus administration of intravenous contrast. CONTRAST:  140mL OMNIPAQUE IOHEXOL 300 MG/ML  SOLN COMPARISON:  Abdominal CT and ultrasound 4 days ago, 02/24/2020. multiple prior CT. FINDINGS: Lower chest: Calcified granuloma in the right lower lobe. Linear atelectasis in  the left lower lobe. No pleural fluid. Heart is normal in size. Hepatobiliary: Cirrhotic hepatic morphology with nodular contours. The liver is enlarged spanning 26.7 cm cranial caudal. Again seen 1.2 cm low-density lesion in the inferior right lobe of the liver, series 3, image 65, unchanged from recent exam. No significant peripheral enhancement. There multiple small subcentimeter adjacent hepatic hypodensities in the right lobe extending from images 59 through 66. Subcentimeter vague hypodensity in the central right lobe series 3, image 39. Postcholecystectomy. Biliary stent remains in place, no significant intra or extrahepatic biliary ductal dilatation. Mild pneumobilia. Pancreas: No ductal dilatation or inflammation. Spleen: No splenomegaly. Unchanged cleft posteriorly. No focal lesion. Adrenals/Urinary Tract: No adrenal nodule. No hydronephrosis or perinephric edema. Homogeneous renal enhancement with symmetric excretion on delayed phase imaging. Urinary bladder is partially distended without wall thickening. Stomach/Bowel: Patulous distal esophagus. Nondistended stomach. Normal positioning of the duodenum and ligament of Treitz. Administered enteric contrast reaches the colon. There is no bowel obstruction. No significant bowel inflammation. Appendix is air-filled and otherwise normal. Distal colonic diverticulosis without evidence of diverticulitis. Vascular/Lymphatic: Normal caliber abdominal aorta. No evidence of portal vein thrombosis. No bulky abdominopelvic adenopathy. Reproductive: Prominent prostate gland. Other: Moderate volume abdominopelvic ascites. This measures simple fluid density. There is no peripheral enhancement or loculation. No free intra-abdominal air. Mesenteric and omental edema is typical of ascites. Small umbilical hernia containing fat and small amount of ascites. Mild skin thickening adjacent to the umbilical hernia in  the midline. Fat in both inguinal canals. Musculoskeletal:  There are no acute or suspicious osseous abnormalities. Peripherally sclerotic lesion in the left femoral head is unchanged from prior exams. 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 IMPRESSION: 1. Stable low-density lesions in the right lobe of the liver, largest measuring 1.2 cm, suspicious for small abscesses. No significant progression or convalescence. Vague subcentimeter structure in the central liver was not definitively seen on prior exam, unclear if this represents a new area of infection or better seen currently due to differences in phase of contrast. 2. Moderate volume abdominopelvic ascites. There is no peripheral enhancement or loculation. 3. Cirrhosis. Biliary stent remains in place, no significant intra or extrahepatic biliary ductal dilatation. 4. Colonic diverticulosis without diverticulitis. 5. Small umbilical hernia containing fat and small amount of ascites. Mild skin thickening adjacent to the umbilical hernia in the midline. 6. Additional stable chronic findings as described. Electronically Signed   By: Keith Rake M.D.   On: 02/28/2020 15:25   CT ABDOMEN PELVIS W CONTRAST  Result Date: 02/24/2020 CLINICAL DATA:  Generalized abdominal pain, swelling, and jaundice with vomiting and fever. Leukocytosis. EXAM: CT ABDOMEN AND PELVIS WITH CONTRAST TECHNIQUE: Multidetector CT imaging of the abdomen and pelvis was performed using the standard protocol following bolus administration of intravenous contrast. CONTRAST:  167mL OMNIPAQUE IOHEXOL 300 MG/ML  SOLN COMPARISON:  12/24/2019 FINDINGS: Lower chest: Punctate calcified nodules in the right middle lobe and right lower lobe. No basilar lung consolidation or pleural effusion. Hepatobiliary: There is a new 1.2 cm hypodense lesion inferiorly in the right hepatic lobe (series 3, image 63), and there are additional more subtle subcentimeter  hypodensities in the inferior right hepatic lobe which also appear new (for example series 3, images 56, 58, 59, and 62). Status post cholecystectomy. A biliary stent remains in place without significant dilatation of the common bile duct, however there is increased dilatation of the common hepatic duct about the proximal aspect of the stent measuring 2.1 cm in diameter. There is at most minimal central intrahepatic biliary dilatation. Pancreas: Unremarkable. Spleen: No focal lesion. Unchanged cleft along the posterior splenic margin. Adrenals/Urinary Tract: Unremarkable adrenal glands. No evidence of renal mass, calculi, or hydronephrosis. Unremarkable bladder. Stomach/Bowel: The stomach is unremarkable. There is no evidence of bowel obstruction. Mildly prominent gaseous distension of the appendix without evidence of appendicitis. Vascular/Lymphatic: No significant vascular findings are present. No enlarged abdominal or pelvic lymph nodes. Reproductive: Mildly enlarged prostate. Other: Increased small to moderate volume ascites. No loculated fluid collection. No pneumoperitoneum. Small umbilical hernia containing fat and fluid. Musculoskeletal: No acute osseous abnormality or suspicious osseous lesion. IMPRESSION: 1. New small ill-defined hypodensities inferiorly in the right hepatic lobe, nonspecific though infection/small abscesses are a concern with this history. 2. Increased small to moderate volume ascites. 3. Unchanged biliary stent with increased dilatation of the common hepatic duct. 4. Aortic Atherosclerosis (ICD10-I70.0). Electronically Signed   By: Logan Bores M.D.   On: 02/24/2020 05:42   DG Chest Port 1 View  Result Date: 02/27/2020 CLINICAL DATA:  Shortness of breath.  Sepsis. EXAM: PORTABLE CHEST 1 VIEW COMPARISON:  12/03/2019 FINDINGS: Lordotic technique is demonstrated. Lungs are adequately inflated without focal airspace consolidation or effusion. Borderline stable cardiomegaly. Remainder of  the exam is unchanged. IMPRESSION: No acute cardiopulmonary disease. Electronically Signed   By: Marin Olp M.D.   On: 02/27/2020 09:13   DG ERCP BILIARY & PANCREATIC  DUCTS  Result Date: 02/24/2020 CLINICAL DATA:  ERCP. EXAM: ERCP TECHNIQUE: Multiple spot images obtained with the fluoroscopic device and submitted for interpretation post-procedure. FLUOROSCOPY TIME:  Fluoroscopy Time:  2 minutes and 13 seconds Radiation Exposure Index (if provided by the fluoroscopic device): 66.82 mGy Number of Acquired Spot Images: 6 COMPARISON:  CT dated February 24, 2020.  11/29/2019 FINDINGS: And insult spot images demonstrate a common bile duct stent. The stent has been removed. Injection of contrast followed by balloon sweep demonstrates a persistent narrowing of the mid and distal common bile duct. There appears to be some mild intrahepatic biliary ductal dilatation. The patient is status post prior cholecystectomy. Final images demonstrate replacement of the plastic biliary stent. IMPRESSION: Status post ERCP as detailed above. These images were submitted for radiologic interpretation only. Please see the procedural report for the amount of contrast and the fluoroscopy time utilized. Electronically Signed   By: Constance Holster M.D.   On: 02/24/2020 18:49   US Abdomen Limited RUQ  Result Date: 02/24/2020 CLINICAL DATA:  Jaundice EXAM: ULTRASOUND ABDOMEN LIMITED RIGHT UPPER QUADRANT COMPARISON:  12/24/2019 FINDINGS: Gallbladder: Surgically absent Common bile duct: Diameter: 8 mm Liver: Nodularity of the liver capsule compatible with cirrhosis. No focal parenchymal abnormality. No intrahepatic duct dilation. Portal vein is patent on color Doppler imaging with normal direction of blood flow towards the liver. Other: There is a small amount of ascites throughout the abdomen, greatest in the lower quadrants. IMPRESSION: 1. Small volume ascites. 2. Findings consistent with hepatic cirrhosis. 3. Cholecystectomy.  Electronically Signed   By: Randa Ngo M.D.   On: 02/24/2020 03:23   IR Paracentesis  Result Date: 02/28/2020 INDICATION: Abdominal pain, distention. Recurrent ascites. Request for therapeutic paracentesis. EXAM: ULTRASOUND GUIDED LEFT LOWER QUADRANT PARACENTESIS MEDICATIONS: None. COMPLICATIONS: None immediate. PROCEDURE: Informed written consent was obtained from the patient after a discussion of the risks, benefits and alternatives to treatment. A timeout was performed prior to the initiation of the procedure. Initial ultrasound scanning demonstrates a small to moderate amount of ascites within the left lower abdominal quadrant. The left lower abdomen was prepped and draped in the usual sterile fashion. 1% lidocaine was used for local anesthesia. Following this, a 19 gauge, 7-cm, Yueh catheter was introduced. An ultrasound image was saved for documentation purposes. The paracentesis was performed. The catheter was removed and a dressing was applied. The patient tolerated the procedure well without immediate post procedural complication. FINDINGS: A total of approximately 1.9 L of clear yellow fluid was removed. IMPRESSION: Successful ultrasound-guided paracentesis yielding 1.9 liters of peritoneal fluid. Read by: Ascencion Dike PA-C Electronically Signed   By: Corrie Mckusick D.O.   On: 02/28/2020 16:23   IR Paracentesis  Result Date: 02/25/2020 INDICATION: Patient with a history of alcoholic hepatitis and ascites presents today for a therapeutic and diagnostic paracentesis. EXAM: ULTRASOUND GUIDED PARACENTESIS MEDICATIONS: 1% lidocaine 10 mL COMPLICATIONS: None immediate PROCEDURE: Informed written consent was obtained from the patient after a discussion of the risks, benefits and alternatives to treatment. A timeout was performed prior to the initiation of the procedure. Initial ultrasound scanning demonstrates a large amount of ascites within the left lower abdominal quadrant. The left lower abdomen  was prepped and draped in the usual sterile fashion. 1% lidocaine was used for local anesthesia. Following this, a 19 gauge, 7-cm, Yueh catheter was introduced. An ultrasound image was saved for documentation purposes. The paracentesis was performed. The catheter was removed and a dressing was applied. The patient tolerated  the procedure well without immediate post procedural complication. FINDINGS: A total of approximately 2.2 L of dark yellow fluid was removed. Samples were sent to the laboratory as requested by the clinical team. IMPRESSION: Successful ultrasound-guided paracentesis yielding 2.2 liters of peritoneal fluid. Read by: Soyla Dryer, NP Electronically Signed   By: Lucrezia Europe M.D.   On: 02/25/2020 14:28      Phillips Climes M.D on 03/04/2020 at 4:12 PM  To page go to [email or web address]

## 2020-03-05 DIAGNOSIS — K652 Spontaneous bacterial peritonitis: Secondary | ICD-10-CM

## 2020-03-05 LAB — COMPREHENSIVE METABOLIC PANEL
ALT: 30 U/L (ref 0–44)
AST: 50 U/L — ABNORMAL HIGH (ref 15–41)
Albumin: 2.4 g/dL — ABNORMAL LOW (ref 3.5–5.0)
Alkaline Phosphatase: 328 U/L — ABNORMAL HIGH (ref 38–126)
Anion gap: 7 (ref 5–15)
BUN: 10 mg/dL (ref 6–20)
CO2: 26 mmol/L (ref 22–32)
Calcium: 8.6 mg/dL — ABNORMAL LOW (ref 8.9–10.3)
Chloride: 101 mmol/L (ref 98–111)
Creatinine, Ser: 0.92 mg/dL (ref 0.61–1.24)
GFR calc Af Amer: >60 mL/min (ref >60)
GFR calc non Af Amer: >60 mL/min (ref >60)
Glucose, Bld: 112 mg/dL — ABNORMAL HIGH (ref 70–99)
Potassium: 4.3 mmol/L (ref 3.5–5.1)
Sodium: 134 mmol/L — ABNORMAL LOW (ref 135–145)
Total Bilirubin: 2.8 mg/dL — ABNORMAL HIGH (ref 0.3–1.2)
Total Protein: 6.5 g/dL (ref 6.5–8.1)

## 2020-03-05 LAB — LIPASE, BLOOD: Lipase: 61 U/L — ABNORMAL HIGH (ref 11–51)

## 2020-03-05 LAB — PROCALCITONIN: Procalcitonin: 0.35 ng/mL

## 2020-03-05 MED ORDER — FUROSEMIDE 40 MG PO TABS
40.0000 mg | ORAL_TABLET | Freq: Every day | ORAL | Status: DC
  Administered 2020-03-06 – 2020-03-20 (×15): 40 mg via ORAL
  Filled 2020-03-05 (×15): qty 1

## 2020-03-05 NOTE — Progress Notes (Addendum)
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 Paulita Fujita to evaluate this week and consider EUS while he is an inpatient to better delineate the cause of his stricture and since outpatient follow-up has been difficult to date and he must understand when he leaves that he cannot drink not even beer and consider increasing diuretics Deneene Tarver E  office [phone] After 5PM or if no answer call [phone]

## 2020-03-05 NOTE — Progress Notes (Signed)
PROGRESS NOTE                                                                                                                                                                                                             Micheal Watson Demographics:    Micheal Watson, is a 59 y.o. male, DOB - 05-07-1961, OIZ:[id number]  Admit date - 02/23/2020   Admitting Physician Karmen Bongo, MD  Outpatient Primary MD for the Micheal Watson is Micheal Watson, No Pcp Per  LOS - 10  Chief Complaint  Micheal Watson presents with  . Abdominal Pain  . Jaundice       Brief Narrative  - Micheal Watson is a 59 y.o. male with medical history significant of ETOH dependence; recent acute cholecystitis/cholangitis s/p CBD stent and JP drain placement (removed since).  He is now presenting with recurrent LLQ abdomnial pain.   Subjective:   Micheal Watson in bed, appears comfortable, denies any headache, no fever, no chest pain or pressure, no shortness of breath , he denies any abdominal pain.   Assessment  & Plan :     Sepsis from SBP following intra-abdominal surgery with current hyperbilirubinemia and obstructed CBD stent along with small Hepatic abscess - in a Micheal Watson with following history  -Hx acute cholecystitis with hydrops; laparoscopic cholecystectomy with postoperative bile leak 11/22/2019 Dr. 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 Clarene Essex -Diagnostic laparoscopy, evacuation of bilious ascites(3L),JP drain placement 12/07/2019 Dr. Donne Hazel -Drain removal 12/23/2019 - ERCP by Dr. Paulita Fujita on 02/24/2020 with CBD biliary stent stricture, stent removal and new stent placed.  Biopsy sent. -Ultrasound-guided paracentesis on 02/24/2021 with 2.2 L of fluid removed - SBP -Another ultrasound-guided paracentesis 7/26 with one-point liters of fluid drained, remains significant for SBP. -Repeat CT scan of abdomen pelvis ordered on 02/28/2020 -shows liver abscesses with largest one  being 1.1 cm.  IR has been consulted, collections are too small to drain. -Continue with diuresis, he is on Lasix 20 mg daily, will increase Aldactone to 50 mg, monitor renal function and potassium closely.   Sepsis due to polymicrobial gram-negative bacteremia caused by SBP/ ? Hepatic abscesses, E. coli, Klebsiella and Proteus growing bacteremia.  Abdominal fluid cultures consistent with SBP.  Both GI and general surgery have been consulted.  He is currently on Rocephin and Flagyl and sepsis pathophysiology is improving.  He still having some right upper quadrant discomfort and repeat CT scan suggests a 1.1 cm liver abscess with multiple small foci, discussed with ID  about antibiotic recommendation, recommendation for Augmentin on discharge for another 2 weeks.  For now we will continue with IV antibiotics given he still having significant abdominal tenderness to palpation, and recurrent ascites. -Per GI has been started on diuretics for his recurrent ascites.  So far he has had 2 ultrasound-guided paracentesis with total 4 L of fluid removed and consistent with SBP.  Blood cultures with no growth to date   Recent Labs  Lab 02/28/20 0401 02/28/20 0401 02/29/20 0344 03/01/20 0836 03/02/20 0336 03/04/20 0854 03/05/20 0326  WBC 12.2*  --  14.9* 12.1* 10.6*  --   --   PLT 311  --  302 357 326  --   --   CRP 3.6*  --  3.3* 3.8* 1.7*  --   --   PROCALCITON 2.52   < > 1.01 0.50 0.37 0.18 0.35   < > = values in this interval not displayed.    HTN -blood pressure stable off of medications.  HLD - He was prescribed Lipitor prior but has not been taking it.  ETOH dependence - ongoing abuse, no signs of withdrawal on CIWA protocol.  Micheal Watson reports he drinks 40 ounce of beer every night .  h/o COVID-19 infection in April 2020  -  Resolved.    Condition - Extremely Guarded  Family Communication  :  None at bedside  Code Status :  Full  Consults  :  GI, CCS  Procedures  :    CT  Repeat - Non acute - 1. Stable low-density lesions in the right lobe of the liver, largest measuring 1.2 cm, suspicious for small abscesses. No significant progression or convalescence. Vague subcentimeter structure in the central liver was not definitively seen on prior exam, unclear if this represents a new area of infection or better seen currently due to differences in phase of contrast. 2. Moderate volume abdominopelvic ascites. There is no peripheral enhancement or loculation. 3. Cirrhosis. Biliary stent remains in place, no significant intra or extrahepatic biliary ductal dilatation. 4. Colonic diverticulosis without diverticulitis. 5. Small umbilical hernia containing fat and small amount of ascites. Mild skin thickening adjacent to the umbilical hernia in the midline.   Ultrasound-guided paracentesis.  SBP.    CT -  1. New small ill-defined hypodensities inferiorly in the right hepatic lobe, nonspecific though infection/small abscesses are a concern with this history. 2. Increased small to moderate volume ascites. 3. Unchanged biliary stent with increased dilatation of the common hepatic duct. 4. Aortic Atherosclerosis  ERCP - with CBD stricture at the site of her stent, stent removed and new temporary stent placed on 02/24/2020 by Dr. Paulita Fujita.  PUD Prophylaxis : PPI  Disposition Plan  :    Status is: Inpatient  Remains inpatient appropriate because:IV treatments appropriate due to intensity of illness or inability to take PO   Dispo: The Micheal Watson is from: Home              Anticipated d/c is to: Home              Anticipated d/c date is: 3 days              Micheal Watson currently is not medically stable to d/c.   DVT Prophylaxis  : Heparin    Lab Results  Component Value Date   PLT 326 03/02/2020    Diet :  Diet Order            DIET SOFT Room service appropriate? Yes; Fluid  consistency: Thin  Diet effective now                  Inpatient Medications Scheduled Meds: .  enoxaparin (LOVENOX) injection  50 mg Subcutaneous Q24H  . folic acid  1 mg Oral Daily  . [START ON 03/06/2020] furosemide  40 mg Oral Daily  . multivitamin with minerals  1 tablet Oral Daily  . pantoprazole  40 mg Oral Daily  . sodium chloride flush  3 mL Intravenous Q12H  . spironolactone  50 mg Oral Daily  . thiamine  100 mg Oral Daily   Or  . thiamine  100 mg Intravenous Daily   Continuous Infusions: . sodium chloride 10 mL/hr at 03/03/20 5643  . albumin human    . cefTRIAXone (ROCEPHIN)  IV 2 g (03/04/20 2148)  . metronidazole 500 mg (03/05/20 1113)   PRN Meds:.sodium chloride, albumin human, HYDROcodone-acetaminophen, lidocaine, lidocaine, [DISCONTINUED] ondansetron **OR** ondansetron (ZOFRAN) IV  Antibiotics  :   Anti-infectives (From admission, onward)   Start     Dose/Rate Route Frequency Ordered Stop   03/03/20 2200  cefTRIAXone (ROCEPHIN) 2 g in sodium chloride 0.9 % 100 mL IVPB     Discontinue     2 g 200 mL/hr over 30 Minutes Intravenous Every 24 hours 03/03/20 1010     03/03/20 1200  metroNIDAZOLE (FLAGYL) IVPB 500 mg     Discontinue     500 mg 100 mL/hr over 60 Minutes Intravenous Every 8 hours 03/03/20 1010     03/03/20 1000  amoxicillin-clavulanate (AUGMENTIN) 875-125 MG per tablet 1 tablet  Status:  Discontinued        1 tablet Oral Every 12 hours 03/03/20 0735 03/03/20 1010   02/25/20 2000  cefTRIAXone (ROCEPHIN) 2 g in sodium chloride 0.9 % 100 mL IVPB  Status:  Discontinued        2 g 200 mL/hr over 30 Minutes Intravenous Every 24 hours 02/24/20 1847 03/03/20 0735   02/25/20 1515  metroNIDAZOLE (FLAGYL) IVPB 500 mg  Status:  Discontinued        500 mg 100 mL/hr over 60 Minutes Intravenous Every 8 hours 02/25/20 1455 03/03/20 0735   02/24/20 1830  cefTRIAXone (ROCEPHIN) 2 g in sodium chloride 0.9 % 100 mL IVPB  Status:  Discontinued        2 g 200 mL/hr over 30 Minutes Intravenous Every 24 hours 02/24/20 1741 02/24/20 1847   02/24/20 1200   piperacillin-tazobactam (ZOSYN) IVPB 3.375 g  Status:  Discontinued        3.375 g 100 mL/hr over 30 Minutes Intravenous Every 6 hours 02/24/20 0934 02/24/20 1009   02/24/20 1200  piperacillin-tazobactam (ZOSYN) IVPB 3.375 g  Status:  Discontinued        3.375 g 12.5 mL/hr over 240 Minutes Intravenous Every 8 hours 02/24/20 1009 02/24/20 1741   02/24/20 0245  piperacillin-tazobactam (ZOSYN) IVPB 3.375 g        3.375 g 100 mL/hr over 30 Minutes Intravenous  Once 02/24/20 0237 02/24/20 0535          Objective:   Vitals:   03/04/20 2045 03/05/20 0239 03/05/20 0510 03/05/20 1411  BP: 124/83 (!) 129/87 127/82 (!) 130/83  Pulse: 79 79 81 83  Resp: 20 19 16 18   Temp: 98.9 F (37.2 C) 98.6 F (37 C) 98.4 F (36.9 C) 98.8 F (37.1 C)  TempSrc: Oral Oral Oral   SpO2: 96% 95% 96% 97%  Weight:  Height:        SpO2: 97 % O2 Flow Rate (L/min): 6 L/min  Wt Readings from Last 3 Encounters:  03/03/20 91.8 kg  12/24/19 88.2 kg  11/28/19 96.4 kg     Intake/Output Summary (Last 24 hours) at 03/05/2020 1456 Last data filed at 03/05/2020 1400 Gross per 24 hour  Intake 1180 ml  Output 1825 ml  Net -645 ml     Physical Exam  Awake Alert, Oriented X 3, No new F.N deficits, Normal affect Symmetrical Chest wall movement, Good air movement bilaterally, CTAB RRR,No Gallops,Rubs or new Murmurs, No Parasternal Heave +ve B.Sounds, Abd Soft, Micheal Watson with ascites, mild, remains tender to palpation, no rebound - guarding or rigidity. No Cyanosis, Clubbing or edema, No new Rash or bruise      Data Review:    Recent Labs  Lab 02/28/20 0401 02/29/20 0344 03/01/20 0836 03/02/20 0336  WBC 12.2* 14.9* 12.1* 10.6*  HGB 14.9 14.2 14.8 13.9  HCT 43.4 41.2 43.6 41.6  PLT 311 302 357 326  MCV 93.9 93.8 95.6 96.3  MCH 32.3 32.3 32.5 32.2  MCHC 34.3 34.5 33.9 33.4  RDW 17.1* 16.6* 16.5* 16.1*  LYMPHSABS 2.7 1.8 2.5 2.3  MONOABS 1.1* 1.1* 1.2* 1.1*  EOSABS 0.1 0.1 0.1 0.1  BASOSABS 0.0  0.1 0.1 0.1    Recent Labs  Lab 02/28/20 0401 02/28/20 0401 02/29/20 0344 02/29/20 0344 03/01/20 0836 03/02/20 0336 03/03/20 1039 03/04/20 0854 03/05/20 0326  NA 136   < > 132*   < > 134* 136 134* 134* 134*  K 3.7   < > 3.8   < > 3.9 4.0 4.2 4.4 4.3  CL 102   < > 100   < > 102 102 100 103 101  CO2 25   < > 25   < > 23 25 24  21* 26  GLUCOSE 108*   < > 103*   < > 139* 109* 110* 118* 112*  BUN 15   < > 9   < > 8 10 9 10 10   CREATININE 1.01   < > 0.90   < > 0.90 0.93 0.95 0.88 0.92  CALCIUM 7.9*   < > 7.8*   < > 8.3* 8.5* 8.5* 8.7* 8.6*  AST 62*   < > 70*   < > 58* 47* 45* 47* 50*  ALT 44   < > 45*   < > 39 34 32 29 30  ALKPHOS 110   < > 104   < > 112 112 112 121 328*  BILITOT 3.3*   < > 3.1*   < > 2.8* 2.4* 2.3* 2.6* 2.8*  ALBUMIN 2.0*   < > 2.0*   < > 2.2* 2.1* 2.4* 2.6* 2.4*  MG 1.6*  --  1.8  --  1.7 1.7  --   --   --   CRP 3.6*  --  3.3*  --  3.8* 1.7*  --   --   --   PROCALCITON 2.52   < > 1.01  --  0.50 0.37  --  0.18 0.35  INR 1.2  --  1.3*  --  1.2 1.2  --  1.1  --   AMMONIA  --   --   --   --   --   --   --  33  --   BNP 133.2*  --  62.9  --  98.0 102.7*  --   --   --    < > =  values in this interval not displayed.    Recent Labs  Lab 02/28/20 0401 02/28/20 0401 02/29/20 0344 03/01/20 0836 03/02/20 0336 03/04/20 0854 03/05/20 0326  CRP 3.6*  --  3.3* 3.8* 1.7*  --   --   BNP 133.2*  --  62.9 98.0 102.7*  --   --   PROCALCITON 2.52   < > 1.01 0.50 0.37 0.18 0.35   < > = values in this interval not displayed.    ------------------------------------------------------------------------------------------------------------------ No results for input(s): CHOL, HDL, LDLCALC, TRIG, CHOLHDL, LDLDIRECT in the last 72 hours.  Lab Results  Component Value Date   HGBA1C 6.0 (H) 04/10/2019   ------------------------------------------------------------------------------------------------------------------ No results for input(s): TSH, T4TOTAL, T3FREE, THYROIDAB in the  last 72 hours.  Invalid input(s): FREET3 ------------------------------------------------------------------------------------------------------------------ No results for input(s): VITAMINB12, FOLATE, FERRITIN, TIBC, IRON, RETICCTPCT in the last 72 hours.  Coagulation profile Recent Labs  Lab 02/28/20 0401 02/29/20 0344 03/01/20 0836 03/02/20 0336 03/04/20 0854  INR 1.2 1.3* 1.2 1.2 1.1    No results for input(s): DDIMER in the last 72 hours.  Cardiac Enzymes No results for input(s): CKMB, TROPONINI, MYOGLOBIN in the last 168 hours.  Invalid input(s): CK ------------------------------------------------------------------------------------------------------------------    Component Value Date/Time   BNP 102.7 (H) 03/02/2020 3382    Micro Results Recent Results (from the past 240 hour(s))  C Difficile Quick Screen w PCR reflex     Status: None   Collection Time: 02/25/20  4:21 AM   Specimen: STOOL  Result Value Ref Range Status   C Diff antigen NEGATIVE NEGATIVE Final   C Diff toxin NEGATIVE NEGATIVE Final   C Diff interpretation No C. difficile detected.  Final    Comment: Performed at [facility], [place] [address], [place], [place] [address]  Gastrointestinal Panel by PCR , Stool     Status: None   Collection Time: 02/25/20  4:21 AM   Specimen: STOOL  Result Value Ref Range Status   Campylobacter species NOT DETECTED NOT DETECTED Final   Plesimonas shigelloides NOT DETECTED NOT DETECTED Final   Salmonella species NOT DETECTED NOT DETECTED Final   Yersinia enterocolitica NOT DETECTED NOT DETECTED Final   Vibrio species NOT DETECTED NOT DETECTED Final   Vibrio cholerae NOT DETECTED NOT DETECTED Final   Enteroaggregative E coli (EAEC) NOT DETECTED NOT DETECTED Final   Enteropathogenic E coli (EPEC) NOT DETECTED NOT DETECTED Final   Enterotoxigenic E coli (ETEC) NOT DETECTED NOT DETECTED Final   Shiga like toxin producing E coli (STEC) NOT DETECTED NOT DETECTED  Final   Shigella/Enteroinvasive E coli (EIEC) NOT DETECTED NOT DETECTED Final   Cryptosporidium NOT DETECTED NOT DETECTED Final   Cyclospora cayetanensis NOT DETECTED NOT DETECTED Final   Entamoeba histolytica NOT DETECTED NOT DETECTED Final   Giardia lamblia NOT DETECTED NOT DETECTED Final   Adenovirus F40/41 NOT DETECTED NOT DETECTED Final   Astrovirus NOT DETECTED NOT DETECTED Final   Norovirus GI/GII NOT DETECTED NOT DETECTED Final   Rotavirus A NOT DETECTED NOT DETECTED Final   Sapovirus (I, II, IV, and V) NOT DETECTED NOT DETECTED Final    Comment: Performed at [facility], [address] [place], [place] [address]  MRSA PCR Screening     Status: None   Collection Time: 02/25/20  4:57 AM  Result Value Ref Range Status   MRSA by PCR NEGATIVE NEGATIVE Final    Comment:        The GeneXpert MRSA Assay (FDA approved for  NASAL specimens only), is one component of a comprehensive MRSA colonization surveillance program. It is not intended to diagnose MRSA infection nor to guide or monitor treatment for MRSA infections. Performed at [facility], [place] [address] [place], [place] [address]   Gram stain     Status: None   Collection Time: 02/25/20  3:21 PM   Specimen: Abdomen  Result Value Ref Range Status   Specimen Description ABDOMEN  Final   Special Requests PERITONEAL FLUID  Final   Gram Stain   Final    ABUNDANT WBC PRESENT, PREDOMINANTLY PMN NO ORGANISMS SEEN Performed at [facility], [place] [address], [place], [place] [address]    Report Status 02/25/2020 FINAL  Final  Culture, body fluid-bottle     Status: None   Collection Time: 02/25/20  3:21 PM   Specimen: Abdomen  Result Value Ref Range Status   Specimen Description ABDOMEN  Final   Special Requests PERITONEAL FLUID  Final   Culture   Final    NO GROWTH 8 DAYS Performed at [facility] [address], [place], [place] [address]    Report Status 03/04/2020 FINAL  Final     Radiology Reports CT ABDOMEN PELVIS W CONTRAST  Result Date: 02/28/2020 CLINICAL DATA:  Abdominal abscess/infection suspected Abdominal pain.  Micheal Watson had recent paracentesis. EXAM: CT ABDOMEN AND PELVIS WITH CONTRAST TECHNIQUE: Multidetector CT imaging of the abdomen and pelvis was performed using the standard protocol following bolus administration of intravenous contrast. CONTRAST:  121mL OMNIPAQUE IOHEXOL 300 MG/ML  SOLN COMPARISON:  Abdominal CT and ultrasound 4 days ago, 02/24/2020. multiple prior CT. FINDINGS: Lower chest: Calcified granuloma in the right lower lobe. Linear atelectasis in the left lower lobe. No pleural fluid. Heart is normal in size. Hepatobiliary: Cirrhotic hepatic morphology with nodular contours. The liver is enlarged spanning 26.7 cm cranial caudal. Again seen 1.2 cm low-density lesion in the inferior right lobe of the liver, series 3, image 65, unchanged from recent exam. No significant peripheral enhancement. There multiple small subcentimeter adjacent hepatic hypodensities in the right lobe extending from images 59 through 66. Subcentimeter vague hypodensity in the central right lobe series 3, image 39. Postcholecystectomy. Biliary stent remains in place, no significant intra or extrahepatic biliary ductal dilatation. Mild pneumobilia. Pancreas: No ductal dilatation or inflammation. Spleen: No splenomegaly. Unchanged cleft posteriorly. No focal lesion. Adrenals/Urinary Tract: No adrenal nodule. No hydronephrosis or perinephric edema. Homogeneous renal enhancement with symmetric excretion on delayed phase imaging. Urinary bladder is partially distended without wall thickening. Stomach/Bowel: Patulous distal esophagus. Nondistended stomach. Normal positioning of the duodenum and ligament of Treitz. Administered enteric contrast reaches the colon. There is no bowel obstruction. No significant bowel inflammation. Appendix is air-filled and otherwise normal. Distal colonic  diverticulosis without evidence of diverticulitis. Vascular/Lymphatic: Normal caliber abdominal aorta. No evidence of portal vein thrombosis. No bulky abdominopelvic adenopathy. Reproductive: Prominent prostate gland. Other: Moderate volume abdominopelvic ascites. This measures simple fluid density. There is no peripheral enhancement or loculation. No free intra-abdominal air. Mesenteric and omental edema is typical of ascites. Small umbilical hernia containing fat and small amount of ascites. Mild skin thickening adjacent to the umbilical hernia in the midline. Fat in both inguinal canals. Musculoskeletal: There are no acute or suspicious osseous abnormalities. Peripherally sclerotic lesion in the left femoral head is unchanged from prior exams. 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 IMPRESSION: 1. Stable low-density lesions  in the right lobe of the liver, largest measuring 1.2 cm, suspicious for small abscesses. No significant progression or convalescence. Vague subcentimeter structure in the central liver was not definitively seen on prior exam, unclear if this represents a new area of infection or better seen currently due to differences in phase of contrast. 2. Moderate volume abdominopelvic ascites. There is no peripheral enhancement or loculation. 3. Cirrhosis. Biliary stent remains in place, no significant intra or extrahepatic biliary ductal dilatation. 4. Colonic diverticulosis without diverticulitis. 5. Small umbilical hernia containing fat and small amount of ascites. Mild skin thickening adjacent to the umbilical hernia in the midline. 6. Additional stable chronic findings as described. Electronically Signed   By: Keith Rake M.D.   On: 02/28/2020 15:25   CT ABDOMEN PELVIS W CONTRAST  Result Date: 02/24/2020 CLINICAL DATA:  Generalized abdominal pain, swelling, and jaundice with vomiting and  fever. Leukocytosis. EXAM: CT ABDOMEN AND PELVIS WITH CONTRAST TECHNIQUE: Multidetector CT imaging of the abdomen and pelvis was performed using the standard protocol following bolus administration of intravenous contrast. CONTRAST:  173mL OMNIPAQUE IOHEXOL 300 MG/ML  SOLN COMPARISON:  12/24/2019 FINDINGS: Lower chest: Punctate calcified nodules in the right middle lobe and right lower lobe. No basilar lung consolidation or pleural effusion. Hepatobiliary: There is a new 1.2 cm hypodense lesion inferiorly in the right hepatic lobe (series 3, image 63), and there are additional more subtle subcentimeter hypodensities in the inferior right hepatic lobe which also appear new (for example series 3, images 56, 58, 59, and 62). Status post cholecystectomy. A biliary stent remains in place without significant dilatation of the common bile duct, however there is increased dilatation of the common hepatic duct about the proximal aspect of the stent measuring 2.1 cm in diameter. There is at most minimal central intrahepatic biliary dilatation. Pancreas: Unremarkable. Spleen: No focal lesion. Unchanged cleft along the posterior splenic margin. Adrenals/Urinary Tract: Unremarkable adrenal glands. No evidence of renal mass, calculi, or hydronephrosis. Unremarkable bladder. Stomach/Bowel: The stomach is unremarkable. There is no evidence of bowel obstruction. Mildly prominent gaseous distension of the appendix without evidence of appendicitis. Vascular/Lymphatic: No significant vascular findings are present. No enlarged abdominal or pelvic lymph nodes. Reproductive: Mildly enlarged prostate. Other: Increased small to moderate volume ascites. No loculated fluid collection. No pneumoperitoneum. Small umbilical hernia containing fat and fluid. Musculoskeletal: No acute osseous abnormality or suspicious osseous lesion. IMPRESSION: 1. New small ill-defined hypodensities inferiorly in the right hepatic lobe, nonspecific though  infection/small abscesses are a concern with this history. 2. Increased small to moderate volume ascites. 3. Unchanged biliary stent with increased dilatation of the common hepatic duct. 4. Aortic Atherosclerosis (ICD10-I70.0). Electronically Signed   By: Logan Bores M.D.   On: 02/24/2020 05:42   DG Chest Port 1 View  Result Date: 02/27/2020 CLINICAL DATA:  Shortness of breath.  Sepsis. EXAM: PORTABLE CHEST 1 VIEW COMPARISON:  12/03/2019 FINDINGS: Lordotic technique is demonstrated. Lungs are adequately inflated without focal airspace consolidation or effusion. Borderline stable cardiomegaly. Remainder of the exam is unchanged. IMPRESSION: No acute cardiopulmonary disease. Electronically Signed   By: Marin Olp M.D.   On: 02/27/2020 09:13   DG ERCP BILIARY & PANCREATIC DUCTS  Result Date: 02/24/2020 CLINICAL DATA:  ERCP. EXAM: ERCP TECHNIQUE: Multiple spot images obtained with the fluoroscopic device and submitted for interpretation post-procedure. FLUOROSCOPY TIME:  Fluoroscopy Time:  2 minutes and 13 seconds Radiation Exposure Index (if provided by the fluoroscopic device): 66.82 mGy Number of Acquired Spot Images: 6  COMPARISON:  CT dated February 24, 2020.  11/29/2019 FINDINGS: And insult spot images demonstrate a common bile duct stent. The stent has been removed. Injection of contrast followed by balloon sweep demonstrates a persistent narrowing of the mid and distal common bile duct. There appears to be some mild intrahepatic biliary ductal dilatation. The Micheal Watson is status post prior cholecystectomy. Final images demonstrate replacement of the plastic biliary stent. IMPRESSION: Status post ERCP as detailed above. These images were submitted for radiologic interpretation only. Please see the procedural report for the amount of contrast and the fluoroscopy time utilized. Electronically Signed   By: Constance Holster M.D.   On: 02/24/2020 18:49   US Abdomen Limited RUQ  Result Date:  02/24/2020 CLINICAL DATA:  Jaundice EXAM: ULTRASOUND ABDOMEN LIMITED RIGHT UPPER QUADRANT COMPARISON:  12/24/2019 FINDINGS: Gallbladder: Surgically absent Common bile duct: Diameter: 8 mm Liver: Nodularity of the liver capsule compatible with cirrhosis. No focal parenchymal abnormality. No intrahepatic duct dilation. Portal vein is patent on color Doppler imaging with normal direction of blood flow towards the liver. Other: There is a small amount of ascites throughout the abdomen, greatest in the lower quadrants. IMPRESSION: 1. Small volume ascites. 2. Findings consistent with hepatic cirrhosis. 3. Cholecystectomy. Electronically Signed   By: Randa Ngo M.D.   On: 02/24/2020 03:23   IR Paracentesis  Result Date: 02/28/2020 INDICATION: Abdominal pain, distention. Recurrent ascites. Request for therapeutic paracentesis. EXAM: ULTRASOUND GUIDED LEFT LOWER QUADRANT PARACENTESIS MEDICATIONS: None. COMPLICATIONS: None immediate. PROCEDURE: Informed written consent was obtained from the Micheal Watson after a discussion of the risks, benefits and alternatives to treatment. A timeout was performed prior to the initiation of the procedure. Initial ultrasound scanning demonstrates a small to moderate amount of ascites within the left lower abdominal quadrant. The left lower abdomen was prepped and draped in the usual sterile fashion. 1% lidocaine was used for local anesthesia. Following this, a 19 gauge, 7-cm, Yueh catheter was introduced. An ultrasound image was saved for documentation purposes. The paracentesis was performed. The catheter was removed and a dressing was applied. The Micheal Watson tolerated the procedure well without immediate post procedural complication. FINDINGS: A total of approximately 1.9 L of clear yellow fluid was removed. IMPRESSION: Successful ultrasound-guided paracentesis yielding 1.9 liters of peritoneal fluid. Read by: Ascencion Dike PA-C Electronically Signed   By: Corrie Mckusick D.O.   On:  02/28/2020 16:23   IR Paracentesis  Result Date: 02/25/2020 INDICATION: Micheal Watson with a history of alcoholic hepatitis and ascites presents today for a therapeutic and diagnostic paracentesis. EXAM: ULTRASOUND GUIDED PARACENTESIS MEDICATIONS: 1% lidocaine 10 mL COMPLICATIONS: None immediate PROCEDURE: Informed written consent was obtained from the Micheal Watson after a discussion of the risks, benefits and alternatives to treatment. A timeout was performed prior to the initiation of the procedure. Initial ultrasound scanning demonstrates a large amount of ascites within the left lower abdominal quadrant. The left lower abdomen was prepped and draped in the usual sterile fashion. 1% lidocaine was used for local anesthesia. Following this, a 19 gauge, 7-cm, Yueh catheter was introduced. An ultrasound image was saved for documentation purposes. The paracentesis was performed. The catheter was removed and a dressing was applied. The Micheal Watson tolerated the procedure well without immediate post procedural complication. FINDINGS: A total of approximately 2.2 L of dark yellow fluid was removed. Samples were sent to the laboratory as requested by the clinical team. IMPRESSION: Successful ultrasound-guided paracentesis yielding 2.2 liters of peritoneal fluid. Read by: Soyla Dryer, NP Electronically Signed   By:  Lucrezia Europe M.D.   On: 02/25/2020 14:28      Phillips Climes M.D on 03/05/2020 at 2:56 PM  To page go to [email or web address]                  PROGRESS NOTE                                                                                                                                                                                                             Micheal Watson Demographics:    Micheal Watson, is a 59 y.o. male, DOB - April 09, 1961, [id number]  Admit date - 02/23/2020   Admitting Physician Karmen Bongo, MD  Outpatient Primary MD for the Micheal Watson is Micheal Watson, No Pcp Per  LOS - 10  Chief  Complaint  Micheal Watson presents with  . Abdominal Pain  . Jaundice       Brief Narrative  - Future Yeldell is a 58 y.o. male with medical history significant of ETOH dependence; recent acute cholecystitis/cholangitis s/p CBD stent and JP drain placement (removed since).  He is now presenting with recurrent LLQ abdomnial pain.   Subjective:   Micheal Watson in bed, appears comfortable, denies any headache, no fever, no chest pain or pressure, no shortness of breath , he denies any abdominal pain.   Assessment  & Plan :     Sepsis from SBP following intra-abdominal surgery with current hyperbilirubinemia and obstructed CBD stent along with small Hepatic abscess - in a Micheal Watson with following history  -Hx acute cholecystitis with hydrops; laparoscopic cholecystectomy with postoperative bile leak 11/22/2019 Dr. 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 Clarene Essex -Diagnostic laparoscopy, evacuation of bilious ascites(3L),JP drain placement 12/07/2019 Dr. Donne Hazel -Drain removal 12/23/2019 - ERCP by Dr. Paulita Fujita on 02/24/2020 with CBD biliary stent stricture, stent removal and new stent placed.  Biopsy sent. -Ultrasound-guided paracentesis on 02/24/2021 with 2.2 L of fluid removed - SBP -Another ultrasound-guided paracentesis 7/26 with one-point liters of fluid drained, remains significant for SBP. -Repeat CT scan of abdomen pelvis ordered on 02/28/2020 -shows liver abscesses with largest one being 1.1 cm.  IR has been consulted, collections are too small to drain. -Continue with diuresis, Aldactone to 50 mg daily, will go ahead and increase his Lasix to 40 mg oral today . -Micheal Watson with worsening ascites, will request ultrasound-guided paracentesis tomorrow, will order bilirubin for peritoneal fluid analysis as well .    Sepsis due to polymicrobial gram-negative bacteremia caused by SBP/ ? Hepatic abscesses, E. coli, Klebsiella and Proteus growing  bacteremia.  Abdominal  fluid cultures consistent with SBP.  Both GI and general surgery have been consulted.  He is currently on Rocephin and Flagyl and sepsis pathophysiology is improving.  He still having some right upper quadrant discomfort and repeat CT scan suggests a 1.1 cm liver abscess with multiple small foci, discussed with ID about antibiotic recommendation, recommendation for Augmentin on discharge for another 2 weeks.  For now we will continue with IV antibiotics given he still having significant abdominal tenderness to palpation, and recurrent ascites. -Per GI has been started on diuretics for his recurrent ascites.  So far he has had 2 ultrasound-guided paracentesis with total 4 L of fluid removed and consistent with SBP.  Blood cultures with no growth to date   Recent Labs  Lab 02/28/20 0401 02/28/20 0401 02/29/20 0344 03/01/20 0836 03/02/20 0336 03/04/20 0854 03/05/20 0326  WBC 12.2*  --  14.9* 12.1* 10.6*  --   --   PLT 311  --  302 357 326  --   --   CRP 3.6*  --  3.3* 3.8* 1.7*  --   --   PROCALCITON 2.52   < > 1.01 0.50 0.37 0.18 0.35   < > = values in this interval not displayed.    HTN -blood pressure stable off of medications.  HLD - He was prescribed Lipitor prior but has not been taking it.  ETOH dependence - ongoing abuse, no signs of withdrawal on CIWA protocol.  Micheal Watson reports he drinks 40 ounce of beer every night .  h/o COVID-19 infection in April 2020  -  Resolved.    Condition - Extremely Guarded  Family Communication  :  None at bedside  Code Status :  Full  Consults  :  GI, CCS  Procedures  :    CT Repeat - Non acute - 1. Stable low-density lesions in the right lobe of the liver, largest measuring 1.2 cm, suspicious for small abscesses. No significant progression or convalescence. Vague subcentimeter structure in the central liver was not definitively seen on prior exam, unclear if this represents a new area of infection or better seen currently due to  differences in phase of contrast. 2. Moderate volume abdominopelvic ascites. There is no peripheral enhancement or loculation. 3. Cirrhosis. Biliary stent remains in place, no significant intra or extrahepatic biliary ductal dilatation. 4. Colonic diverticulosis without diverticulitis. 5. Small umbilical hernia containing fat and small amount of ascites. Mild skin thickening adjacent to the umbilical hernia in the midline.   Ultrasound-guided paracentesis.  SBP.    CT -  1. New small ill-defined hypodensities inferiorly in the right hepatic lobe, nonspecific though infection/small abscesses are a concern with this history. 2. Increased small to moderate volume ascites. 3. Unchanged biliary stent with increased dilatation of the common hepatic duct. 4. Aortic Atherosclerosis  ERCP - with CBD stricture at the site of her stent, stent removed and new temporary stent placed on 02/24/2020 by Dr. Paulita Fujita.  PUD Prophylaxis : PPI  Disposition Plan  :    Status is: Inpatient  Remains inpatient appropriate because:IV treatments appropriate due to intensity of illness or inability to take PO   Dispo: The Micheal Watson is from: Home              Anticipated d/c is to: Home              Anticipated d/c date is: 3 days  Micheal Watson currently is not medically stable to d/c.   DVT Prophylaxis  : Heparin    Lab Results  Component Value Date   PLT 326 03/02/2020    Diet :  Diet Order            DIET SOFT Room service appropriate? Yes; Fluid consistency: Thin  Diet effective now                  Inpatient Medications Scheduled Meds: . enoxaparin (LOVENOX) injection  50 mg Subcutaneous Q24H  . folic acid  1 mg Oral Daily  . [START ON 03/06/2020] furosemide  40 mg Oral Daily  . multivitamin with minerals  1 tablet Oral Daily  . pantoprazole  40 mg Oral Daily  . sodium chloride flush  3 mL Intravenous Q12H  . spironolactone  50 mg Oral Daily  . thiamine  100 mg Oral Daily   Or  . thiamine   100 mg Intravenous Daily   Continuous Infusions: . sodium chloride 10 mL/hr at 03/03/20 2637  . albumin human    . cefTRIAXone (ROCEPHIN)  IV 2 g (03/04/20 2148)  . metronidazole 500 mg (03/05/20 1113)   PRN Meds:.sodium chloride, albumin human, HYDROcodone-acetaminophen, lidocaine, lidocaine, [DISCONTINUED] ondansetron **OR** ondansetron (ZOFRAN) IV  Antibiotics  :   Anti-infectives (From admission, onward)   Start     Dose/Rate Route Frequency Ordered Stop   03/03/20 2200  cefTRIAXone (ROCEPHIN) 2 g in sodium chloride 0.9 % 100 mL IVPB     Discontinue     2 g 200 mL/hr over 30 Minutes Intravenous Every 24 hours 03/03/20 1010     03/03/20 1200  metroNIDAZOLE (FLAGYL) IVPB 500 mg     Discontinue     500 mg 100 mL/hr over 60 Minutes Intravenous Every 8 hours 03/03/20 1010     03/03/20 1000  amoxicillin-clavulanate (AUGMENTIN) 875-125 MG per tablet 1 tablet  Status:  Discontinued        1 tablet Oral Every 12 hours 03/03/20 0735 03/03/20 1010   02/25/20 2000  cefTRIAXone (ROCEPHIN) 2 g in sodium chloride 0.9 % 100 mL IVPB  Status:  Discontinued        2 g 200 mL/hr over 30 Minutes Intravenous Every 24 hours 02/24/20 1847 03/03/20 0735   02/25/20 1515  metroNIDAZOLE (FLAGYL) IVPB 500 mg  Status:  Discontinued        500 mg 100 mL/hr over 60 Minutes Intravenous Every 8 hours 02/25/20 1455 03/03/20 0735   02/24/20 1830  cefTRIAXone (ROCEPHIN) 2 g in sodium chloride 0.9 % 100 mL IVPB  Status:  Discontinued        2 g 200 mL/hr over 30 Minutes Intravenous Every 24 hours 02/24/20 1741 02/24/20 1847   02/24/20 1200  piperacillin-tazobactam (ZOSYN) IVPB 3.375 g  Status:  Discontinued        3.375 g 100 mL/hr over 30 Minutes Intravenous Every 6 hours 02/24/20 0934 02/24/20 1009   02/24/20 1200  piperacillin-tazobactam (ZOSYN) IVPB 3.375 g  Status:  Discontinued        3.375 g 12.5 mL/hr over 240 Minutes Intravenous Every 8 hours 02/24/20 1009 02/24/20 1741   02/24/20 0245   piperacillin-tazobactam (ZOSYN) IVPB 3.375 g        3.375 g 100 mL/hr over 30 Minutes Intravenous  Once 02/24/20 0237 02/24/20 0535          Objective:   Vitals:   03/04/20 2045 03/05/20 0239 03/05/20 0510 03/05/20 1411  BP:  124/83 (!) 129/87 127/82 (!) 130/83  Pulse: 79 79 81 83  Resp: 20 19 16 18   Temp: 98.9 F (37.2 C) 98.6 F (37 C) 98.4 F (36.9 C) 98.8 F (37.1 C)  TempSrc: Oral Oral Oral   SpO2: 96% 95% 96% 97%  Weight:      Height:        SpO2: 97 % O2 Flow Rate (L/min): 6 L/min  Wt Readings from Last 3 Encounters:  03/03/20 91.8 kg  12/24/19 88.2 kg  11/28/19 96.4 kg     Intake/Output Summary (Last 24 hours) at 03/05/2020 1456 Last data filed at 03/05/2020 1400 Gross per 24 hour  Intake 1180 ml  Output 1825 ml  Net -645 ml     Physical Exam  Awake Alert, Oriented X 3, No new F.N deficits, Normal affect Symmetrical Chest wall movement, Good air movement bilaterally, CTAB RRR,No Gallops,Rubs or new Murmurs, No Parasternal Heave +ve B.Sounds, Abd Soft, Micheal Watson with increased ascites, abdomen remains tender to palpation, no rebound - guarding or rigidity. No Cyanosis, Clubbing or edema, No new Rash or bruise      Data Review:    Recent Labs  Lab 02/28/20 0401 02/29/20 0344 03/01/20 0836 03/02/20 0336  WBC 12.2* 14.9* 12.1* 10.6*  HGB 14.9 14.2 14.8 13.9  HCT 43.4 41.2 43.6 41.6  PLT 311 302 357 326  MCV 93.9 93.8 95.6 96.3  MCH 32.3 32.3 32.5 32.2  MCHC 34.3 34.5 33.9 33.4  RDW 17.1* 16.6* 16.5* 16.1*  LYMPHSABS 2.7 1.8 2.5 2.3  MONOABS 1.1* 1.1* 1.2* 1.1*  EOSABS 0.1 0.1 0.1 0.1  BASOSABS 0.0 0.1 0.1 0.1    Recent Labs  Lab 02/28/20 0401 02/28/20 0401 02/29/20 0344 02/29/20 0344 03/01/20 0836 03/02/20 0336 03/03/20 1039 03/04/20 0854 03/05/20 0326  NA 136   < > 132*   < > 134* 136 134* 134* 134*  K 3.7   < > 3.8   < > 3.9 4.0 4.2 4.4 4.3  CL 102   < > 100   < > 102 102 100 103 101  CO2 25   < > 25   < > 23 25 24  21* 26   GLUCOSE 108*   < > 103*   < > 139* 109* 110* 118* 112*  BUN 15   < > 9   < > 8 10 9 10 10   CREATININE 1.01   < > 0.90   < > 0.90 0.93 0.95 0.88 0.92  CALCIUM 7.9*   < > 7.8*   < > 8.3* 8.5* 8.5* 8.7* 8.6*  AST 62*   < > 70*   < > 58* 47* 45* 47* 50*  ALT 44   < > 45*   < > 39 34 32 29 30  ALKPHOS 110   < > 104   < > 112 112 112 121 328*  BILITOT 3.3*   < > 3.1*   < > 2.8* 2.4* 2.3* 2.6* 2.8*  ALBUMIN 2.0*   < > 2.0*   < > 2.2* 2.1* 2.4* 2.6* 2.4*  MG 1.6*  --  1.8  --  1.7 1.7  --   --   --   CRP 3.6*  --  3.3*  --  3.8* 1.7*  --   --   --   PROCALCITON 2.52   < > 1.01  --  0.50 0.37  --  0.18 0.35  INR 1.2  --  1.3*  --  1.2 1.2  --  1.1  --   AMMONIA  --   --   --   --   --   --   --  33  --   BNP 133.2*  --  62.9  --  98.0 102.7*  --   --   --    < > = values in this interval not displayed.    Recent Labs  Lab 02/28/20 0401 02/28/20 0401 02/29/20 0344 03/01/20 0836 03/02/20 0336 03/04/20 0854 03/05/20 0326  CRP 3.6*  --  3.3* 3.8* 1.7*  --   --   BNP 133.2*  --  62.9 98.0 102.7*  --   --   PROCALCITON 2.52   < > 1.01 0.50 0.37 0.18 0.35   < > = values in this interval not displayed.    ------------------------------------------------------------------------------------------------------------------ No results for input(s): CHOL, HDL, LDLCALC, TRIG, CHOLHDL, LDLDIRECT in the last 72 hours.  Lab Results  Component Value Date   HGBA1C 6.0 (H) 04/10/2019   ------------------------------------------------------------------------------------------------------------------ No results for input(s): TSH, T4TOTAL, T3FREE, THYROIDAB in the last 72 hours.  Invalid input(s): FREET3 ------------------------------------------------------------------------------------------------------------------ No results for input(s): VITAMINB12, FOLATE, FERRITIN, TIBC, IRON, RETICCTPCT in the last 72 hours.  Coagulation profile Recent Labs  Lab 02/28/20 0401 02/29/20 0344 03/01/20 0836  03/02/20 0336 03/04/20 0854  INR 1.2 1.3* 1.2 1.2 1.1    No results for input(s): DDIMER in the last 72 hours.  Cardiac Enzymes No results for input(s): CKMB, TROPONINI, MYOGLOBIN in the last 168 hours.  Invalid input(s): CK ------------------------------------------------------------------------------------------------------------------    Component Value Date/Time   BNP 102.7 (H) 03/02/2020 8841    Micro Results Recent Results (from the past 240 hour(s))  C Difficile Quick Screen w PCR reflex     Status: None   Collection Time: 02/25/20  4:21 AM   Specimen: STOOL  Result Value Ref Range Status   C Diff antigen NEGATIVE NEGATIVE Final   C Diff toxin NEGATIVE NEGATIVE Final   C Diff interpretation No C. difficile detected.  Final    Comment: Performed at [facility], [place] [address] [place], [place] [address]  Gastrointestinal Panel by PCR , Stool     Status: None   Collection Time: 02/25/20  4:21 AM   Specimen: STOOL  Result Value Ref Range Status   Campylobacter species NOT DETECTED NOT DETECTED Final   Plesimonas shigelloides NOT DETECTED NOT DETECTED Final   Salmonella species NOT DETECTED NOT DETECTED Final   Yersinia enterocolitica NOT DETECTED NOT DETECTED Final   Vibrio species NOT DETECTED NOT DETECTED Final   Vibrio cholerae NOT DETECTED NOT DETECTED Final   Enteroaggregative E coli (EAEC) NOT DETECTED NOT DETECTED Final   Enteropathogenic E coli (EPEC) NOT DETECTED NOT DETECTED Final   Enterotoxigenic E coli (ETEC) NOT DETECTED NOT DETECTED Final   Shiga like toxin producing E coli (STEC) NOT DETECTED NOT DETECTED Final   Shigella/Enteroinvasive E coli (EIEC) NOT DETECTED NOT DETECTED Final   Cryptosporidium NOT DETECTED NOT DETECTED Final   Cyclospora cayetanensis NOT DETECTED NOT DETECTED Final   Entamoeba histolytica NOT DETECTED NOT DETECTED Final   Giardia lamblia NOT DETECTED NOT DETECTED Final   Adenovirus F40/41 NOT DETECTED NOT DETECTED Final    Astrovirus NOT DETECTED NOT DETECTED Final   Norovirus GI/GII NOT DETECTED NOT DETECTED Final   Rotavirus A NOT DETECTED NOT DETECTED Final   Sapovirus (I, II, IV, and V) NOT DETECTED NOT DETECTED Final    Comment:  Performed at [facility], [address] [place], [place] [address]  MRSA PCR Screening     Status: None   Collection Time: 02/25/20  4:57 AM  Result Value Ref Range Status   MRSA by PCR NEGATIVE NEGATIVE Final    Comment:        The GeneXpert MRSA Assay (FDA approved for NASAL specimens only), is one component of a comprehensive MRSA colonization surveillance program. It is not intended to diagnose MRSA infection nor to guide or monitor treatment for MRSA infections. Performed at [facility], [place] [address], [place], [place] [address]   Gram stain     Status: None   Collection Time: 02/25/20  3:21 PM   Specimen: Abdomen  Result Value Ref Range Status   Specimen Description ABDOMEN  Final   Special Requests PERITONEAL FLUID  Final   Gram Stain   Final    ABUNDANT WBC PRESENT, PREDOMINANTLY PMN NO ORGANISMS SEEN Performed at [facility], [place] [address] [place], [place] [address]    Report Status 02/25/2020 FINAL  Final  Culture, body fluid-bottle     Status: None   Collection Time: 02/25/20  3:21 PM   Specimen: Abdomen  Result Value Ref Range Status   Specimen Description ABDOMEN  Final   Special Requests PERITONEAL FLUID  Final   Culture   Final    NO GROWTH 8 DAYS Performed at [place] [address] [place], [place] [address]    Report Status 03/04/2020 FINAL  Final    Radiology Reports CT ABDOMEN PELVIS W CONTRAST  Result Date: 02/28/2020 CLINICAL DATA:  Abdominal abscess/infection suspected Abdominal pain.  Micheal Watson had recent paracentesis. EXAM: CT ABDOMEN AND PELVIS WITH CONTRAST TECHNIQUE: Multidetector CT imaging of the abdomen and pelvis was performed using the standard protocol following bolus  administration of intravenous contrast. CONTRAST:  159mL OMNIPAQUE IOHEXOL 300 MG/ML  SOLN COMPARISON:  Abdominal CT and ultrasound 4 days ago, 02/24/2020. multiple prior CT. FINDINGS: Lower chest: Calcified granuloma in the right lower lobe. Linear atelectasis in the left lower lobe. No pleural fluid. Heart is normal in size. Hepatobiliary: Cirrhotic hepatic morphology with nodular contours. The liver is enlarged spanning 26.7 cm cranial caudal. Again seen 1.2 cm low-density lesion in the inferior right lobe of the liver, series 3, image 65, unchanged from recent exam. No significant peripheral enhancement. There multiple small subcentimeter adjacent hepatic hypodensities in the right lobe extending from images 59 through 66. Subcentimeter vague hypodensity in the central right lobe series 3, image 39. Postcholecystectomy. Biliary stent remains in place, no significant intra or extrahepatic biliary ductal dilatation. Mild pneumobilia. Pancreas: No ductal dilatation or inflammation. Spleen: No splenomegaly. Unchanged cleft posteriorly. No focal lesion. Adrenals/Urinary Tract: No adrenal nodule. No hydronephrosis or perinephric edema. Homogeneous renal enhancement with symmetric excretion on delayed phase imaging. Urinary bladder is partially distended without wall thickening. Stomach/Bowel: Patulous distal esophagus. Nondistended stomach. Normal positioning of the duodenum and ligament of Treitz. Administered enteric contrast reaches the colon. There is no bowel obstruction. No significant bowel inflammation. Appendix is air-filled and otherwise normal. Distal colonic diverticulosis without evidence of diverticulitis. Vascular/Lymphatic: Normal caliber abdominal aorta. No evidence of portal vein thrombosis. No bulky abdominopelvic adenopathy. Reproductive: Prominent prostate gland. Other: Moderate volume abdominopelvic ascites. This measures simple fluid density. There is no peripheral enhancement or loculation. No  free intra-abdominal air. Mesenteric and omental edema is typical of ascites. Small umbilical hernia containing fat and small amount of  ascites. Mild skin thickening adjacent to the umbilical hernia in the midline. Fat in both inguinal canals. Musculoskeletal: There are no acute or suspicious osseous abnormalities. Peripherally sclerotic lesion in the left femoral head is unchanged from prior exams. 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 IMPRESSION: 1. Stable low-density lesions in the right lobe of the liver, largest measuring 1.2 cm, suspicious for small abscesses. No significant progression or convalescence. Vague subcentimeter structure in the central liver was not definitively seen on prior exam, unclear if this represents a new area of infection or better seen currently due to differences in phase of contrast. 2. Moderate volume abdominopelvic ascites. There is no peripheral enhancement or loculation. 3. Cirrhosis. Biliary stent remains in place, no significant intra or extrahepatic biliary ductal dilatation. 4. Colonic diverticulosis without diverticulitis. 5. Small umbilical hernia containing fat and small amount of ascites. Mild skin thickening adjacent to the umbilical hernia in the midline. 6. Additional stable chronic findings as described. Electronically Signed   By: Keith Rake M.D.   On: 02/28/2020 15:25   CT ABDOMEN PELVIS W CONTRAST  Result Date: 02/24/2020 CLINICAL DATA:  Generalized abdominal pain, swelling, and jaundice with vomiting and fever. Leukocytosis. EXAM: CT ABDOMEN AND PELVIS WITH CONTRAST TECHNIQUE: Multidetector CT imaging of the abdomen and pelvis was performed using the standard protocol following bolus administration of intravenous contrast. CONTRAST:  165mL OMNIPAQUE IOHEXOL 300 MG/ML  SOLN COMPARISON:  12/24/2019 FINDINGS: Lower chest: Punctate calcified nodules in  the right middle lobe and right lower lobe. No basilar lung consolidation or pleural effusion. Hepatobiliary: There is a new 1.2 cm hypodense lesion inferiorly in the right hepatic lobe (series 3, image 63), and there are additional more subtle subcentimeter hypodensities in the inferior right hepatic lobe which also appear new (for example series 3, images 56, 58, 59, and 62). Status post cholecystectomy. A biliary stent remains in place without significant dilatation of the common bile duct, however there is increased dilatation of the common hepatic duct about the proximal aspect of the stent measuring 2.1 cm in diameter. There is at most minimal central intrahepatic biliary dilatation. Pancreas: Unremarkable. Spleen: No focal lesion. Unchanged cleft along the posterior splenic margin. Adrenals/Urinary Tract: Unremarkable adrenal glands. No evidence of renal mass, calculi, or hydronephrosis. Unremarkable bladder. Stomach/Bowel: The stomach is unremarkable. There is no evidence of bowel obstruction. Mildly prominent gaseous distension of the appendix without evidence of appendicitis. Vascular/Lymphatic: No significant vascular findings are present. No enlarged abdominal or pelvic lymph nodes. Reproductive: Mildly enlarged prostate. Other: Increased small to moderate volume ascites. No loculated fluid collection. No pneumoperitoneum. Small umbilical hernia containing fat and fluid. Musculoskeletal: No acute osseous abnormality or suspicious osseous lesion. IMPRESSION: 1. New small ill-defined hypodensities inferiorly in the right hepatic lobe, nonspecific though infection/small abscesses are a concern with this history. 2. Increased small to moderate volume ascites. 3. Unchanged biliary stent with increased dilatation of the common hepatic duct. 4. Aortic Atherosclerosis (ICD10-I70.0). Electronically Signed   By: Logan Bores M.D.   On: 02/24/2020 05:42   DG Chest Port 1 View  Result Date: 02/27/2020 CLINICAL  DATA:  Shortness of breath.  Sepsis. EXAM: PORTABLE CHEST 1 VIEW COMPARISON:  12/03/2019 FINDINGS: Lordotic technique is demonstrated. Lungs are adequately inflated without focal airspace consolidation or effusion. Borderline stable cardiomegaly. Remainder of the exam is unchanged. IMPRESSION: No acute cardiopulmonary disease. Electronically Signed   By: Marin Olp M.D.   On:  02/27/2020 09:13   DG ERCP BILIARY & PANCREATIC DUCTS  Result Date: 02/24/2020 CLINICAL DATA:  ERCP. EXAM: ERCP TECHNIQUE: Multiple spot images obtained with the fluoroscopic device and submitted for interpretation post-procedure. FLUOROSCOPY TIME:  Fluoroscopy Time:  2 minutes and 13 seconds Radiation Exposure Index (if provided by the fluoroscopic device): 66.82 mGy Number of Acquired Spot Images: 6 COMPARISON:  CT dated February 24, 2020.  11/29/2019 FINDINGS: And insult spot images demonstrate a common bile duct stent. The stent has been removed. Injection of contrast followed by balloon sweep demonstrates a persistent narrowing of the mid and distal common bile duct. There appears to be some mild intrahepatic biliary ductal dilatation. The Micheal Watson is status post prior cholecystectomy. Final images demonstrate replacement of the plastic biliary stent. IMPRESSION: Status post ERCP as detailed above. These images were submitted for radiologic interpretation only. Please see the procedural report for the amount of contrast and the fluoroscopy time utilized. Electronically Signed   By: Constance Holster M.D.   On: 02/24/2020 18:49   US Abdomen Limited RUQ  Result Date: 02/24/2020 CLINICAL DATA:  Jaundice EXAM: ULTRASOUND ABDOMEN LIMITED RIGHT UPPER QUADRANT COMPARISON:  12/24/2019 FINDINGS: Gallbladder: Surgically absent Common bile duct: Diameter: 8 mm Liver: Nodularity of the liver capsule compatible with cirrhosis. No focal parenchymal abnormality. No intrahepatic duct dilation. Portal vein is patent on color Doppler imaging with  normal direction of blood flow towards the liver. Other: There is a small amount of ascites throughout the abdomen, greatest in the lower quadrants. IMPRESSION: 1. Small volume ascites. 2. Findings consistent with hepatic cirrhosis. 3. Cholecystectomy. Electronically Signed   By: Randa Ngo M.D.   On: 02/24/2020 03:23   IR Paracentesis  Result Date: 02/28/2020 INDICATION: Abdominal pain, distention. Recurrent ascites. Request for therapeutic paracentesis. EXAM: ULTRASOUND GUIDED LEFT LOWER QUADRANT PARACENTESIS MEDICATIONS: None. COMPLICATIONS: None immediate. PROCEDURE: Informed written consent was obtained from the Micheal Watson after a discussion of the risks, benefits and alternatives to treatment. A timeout was performed prior to the initiation of the procedure. Initial ultrasound scanning demonstrates a small to moderate amount of ascites within the left lower abdominal quadrant. The left lower abdomen was prepped and draped in the usual sterile fashion. 1% lidocaine was used for local anesthesia. Following this, a 19 gauge, 7-cm, Yueh catheter was introduced. An ultrasound image was saved for documentation purposes. The paracentesis was performed. The catheter was removed and a dressing was applied. The Micheal Watson tolerated the procedure well without immediate post procedural complication. FINDINGS: A total of approximately 1.9 L of clear yellow fluid was removed. IMPRESSION: Successful ultrasound-guided paracentesis yielding 1.9 liters of peritoneal fluid. Read by: Ascencion Dike PA-C Electronically Signed   By: Corrie Mckusick D.O.   On: 02/28/2020 16:23   IR Paracentesis  Result Date: 02/25/2020 INDICATION: Micheal Watson with a history of alcoholic hepatitis and ascites presents today for a therapeutic and diagnostic paracentesis. EXAM: ULTRASOUND GUIDED PARACENTESIS MEDICATIONS: 1% lidocaine 10 mL COMPLICATIONS: None immediate PROCEDURE: Informed written consent was obtained from the Micheal Watson after a discussion  of the risks, benefits and alternatives to treatment. A timeout was performed prior to the initiation of the procedure. Initial ultrasound scanning demonstrates a large amount of ascites within the left lower abdominal quadrant. The left lower abdomen was prepped and draped in the usual sterile fashion. 1% lidocaine was used for local anesthesia. Following this, a 19 gauge, 7-cm, Yueh catheter was introduced. An ultrasound image was saved for documentation purposes. The paracentesis was performed. The catheter was  removed and a dressing was applied. The Micheal Watson tolerated the procedure well without immediate post procedural complication. FINDINGS: A total of approximately 2.2 L of dark yellow fluid was removed. Samples were sent to the laboratory as requested by the clinical team. IMPRESSION: Successful ultrasound-guided paracentesis yielding 2.2 liters of peritoneal fluid. Read by: Soyla Dryer, NP Electronically Signed   By: Lucrezia Europe M.D.   On: 02/25/2020 14:28      Phillips Climes M.D on 03/05/2020 at 2:56 PM  To page go to [email or web address]

## 2020-03-06 ENCOUNTER — Inpatient Hospital Stay (HOSPITAL_COMMUNITY): Payer: Self-pay

## 2020-03-06 HISTORY — PX: IR PARACENTESIS: IMG2679

## 2020-03-06 LAB — ALBUMIN, PLEURAL OR PERITONEAL FLUID: Albumin, Fluid: 2 g/dL

## 2020-03-06 LAB — BODY FLUID CELL COUNT WITH DIFFERENTIAL
Eos, Fluid: 1 %
Lymphs, Fluid: 10 %
Monocyte-Macrophage-Serous Fluid: 32 % — ABNORMAL LOW (ref 50–90)
Neutrophil Count, Fluid: 57 % — ABNORMAL HIGH (ref 0–25)
Total Nucleated Cell Count, Fluid: 950 cu mm (ref 0–1000)

## 2020-03-06 MED ORDER — LIDOCAINE HCL 1 % IJ SOLN
INTRAMUSCULAR | Status: AC
  Filled 2020-03-06: qty 20

## 2020-03-06 MED ORDER — LIDOCAINE HCL 1 % IJ SOLN
INTRAMUSCULAR | Status: DC | PRN
  Administered 2020-03-06: 15 mL

## 2020-03-06 NOTE — Progress Notes (Signed)
Patient seen with bedside CMA (who is fluent in [place]).  Abdominal pain essentially resolved.  LFTs downtrending.  Abdomen soft on exam.  Suggest:  1.  Follow up paracentesis results. 2.  Continue supportive care including antibiotics. 3.  Consider MRI in next 1-2 days to better assess biliary stricture.  4.  Consider ca 19-9 and CEA once LFTs closer to normal. 5.  May need EUS for assessment of biliary stricture, prior to discharge.

## 2020-03-06 NOTE — Procedures (Signed)
PROCEDURE SUMMARY:  Successful US guided paracentesis from left lateral abdomen.  Yielded 1.1 liters of clear yellow fluid.  No immediate complications.  Patient tolerated well.  EBL = trace  Specimen was sent for labs.  Jovonna Nickell S Weda Baumgarner PA-C 03/06/2020 10:15 AM

## 2020-03-06 NOTE — Progress Notes (Addendum)
PROGRESS NOTE  Micheal Watson OFB:[id number] DOB: 03/26/1961 DOA: 02/23/2020 PCP: Patient, No Pcp Per   LOS: 11 days   Brief narrative: As per HPI,  Micheal Watson is a 59 y.o. male with medical history significant of ETOH dependence; recent acute cholecystitis/cholangitis s/p CBD stent and JP drain placement. He presented to hospital this time with recurrent abdominal pain nausea vomiting and fever.  Patient continues to drink alcohol.  In the ED, patient was noted to have ascites.  Bilirubin was elevated at 19 from 0.8.  Patient received code sepsis, was given vancomycin and Zosyn and was admitted hospital for further evaluation and treatment.   Assessment/Plan:  Principal Problem:   Sepsis following intra-abdominal surgery ([place]) Active Problems:   Hyperbilirubinemia   Essential hypertension   Alcohol dependence with uncomplicated withdrawal ([place])   Sepsis secondary to spontaneous bacterial peritonitis.  Status post intra-abdominal surgery with current hyperbilirubinemia and obstructed CBD stent along with small Hepatic abscess. Patient with history of  acute cholecystitis with hydrops; underwent laparoscopic cholecystectomy with postoperative bile leak 11/22/2019 . Patient did have CBD stricture with ERCP with stent placement 11/29/2019 by Dr. Clarene Essex.  On 12/07/2019, patient underwent diagnostic laparoscopy with evacuation of bilious ascites(3L), JP drain placement 12/07/2019 Dr. Donne Hazel. Drain removal 12/23/2019. ERCP by Dr. Paulita Fujita on 02/24/2020 with CBD biliary stent stricture, stent removal and new stent placed.  Biopsy sent. Ultrasound-guided paracentesis on 02/24/2021 with 2.2 L of fluid removed which showed SBP. Another ultrasound-guided paracentesis on 7/26 with 1 liters of fluid drained, remains significant for SBP. Repeat CT scan of abdomen pelvis ordered on 02/28/2020 -showed liver abscesses with largest one being 1.1 cm.  IR was been consulted, collections are too small  to drain.  Patient did undergo diagnostic tapping today. follow culture cytology and labs.  Currently on Lasix Aldactone.   Biliary stricture.  GI recommending MRI in the next 1 to 2 days.  Follow LFTs.  Patient might need EUS for assessment of biliary stricture prior to discharge  Hyponatremia.  Secondary to alcohol abuse.  Sodium of 134 today.   Sepsis due to polymicrobial gram-negative bacteremia caused by SBP/  Hepatic abscesses E. coli, Klebsiella and Proteus  bacteremia. GI and general surgery on board. On Rocephin and Flagyl. A repeat CT scan suggests a 1.1 cm liver abscess with multiple small foci, previous provider had discussed with ID about antibiotic recommendation, recommendation for Augmentin on discharge for another 2 weeks.  Continue IV antibiotics given he still having significant abdominal tenderness to palpation, and recurrent ascites. So far he has had 2 ultrasound-guided paracentesis with total 4 L of fluid removed and consistent with SBP.  Blood cultures with no growth to date.  History of EtOH dependence.  Still drinking.  Counseling done. No withdrawal symptoms.    DVT prophylaxis: SCDs Start: 02/24/20 0934    Code Status: Full code  Family Communication: none  Status is: Inpatient  Remains inpatient appropriate because:IV treatments appropriate due to intensity of illness or inability to take PO and Inpatient level of care appropriate due to severity of illness, GI intervention/ IR procedure   Dispo: The patient is from: Home              Anticipated d/c is to: Home              Anticipated d/c date is: 3 days              Patient currently is not medically stable to d/c.  Consultants: GI,  general surgery  Procedures: Paracentesis ERCP  Antibiotics:  Rocephin, metronidazole IV  Anti-infectives (From admission, onward)    Start     Dose/Rate Route Frequency Ordered Stop   03/03/20 2200  cefTRIAXone (ROCEPHIN) 2 g in sodium chloride 0.9 % 100 mL IVPB      Discontinue     2 g 200 mL/hr over 30 Minutes Intravenous Every 24 hours 03/03/20 1010     03/03/20 1200  metroNIDAZOLE (FLAGYL) IVPB 500 mg     Discontinue     500 mg 100 mL/hr over 60 Minutes Intravenous Every 8 hours 03/03/20 1010     03/03/20 1000  amoxicillin-clavulanate (AUGMENTIN) 875-125 MG per tablet 1 tablet  Status:  Discontinued        1 tablet Oral Every 12 hours 03/03/20 0735 03/03/20 1010   02/25/20 2000  cefTRIAXone (ROCEPHIN) 2 g in sodium chloride 0.9 % 100 mL IVPB  Status:  Discontinued        2 g 200 mL/hr over 30 Minutes Intravenous Every 24 hours 02/24/20 1847 03/03/20 0735   02/25/20 1515  metroNIDAZOLE (FLAGYL) IVPB 500 mg  Status:  Discontinued        500 mg 100 mL/hr over 60 Minutes Intravenous Every 8 hours 02/25/20 1455 03/03/20 0735   02/24/20 1830  cefTRIAXone (ROCEPHIN) 2 g in sodium chloride 0.9 % 100 mL IVPB  Status:  Discontinued        2 g 200 mL/hr over 30 Minutes Intravenous Every 24 hours 02/24/20 1741 02/24/20 1847   02/24/20 1200  piperacillin-tazobactam (ZOSYN) IVPB 3.375 g  Status:  Discontinued        3.375 g 100 mL/hr over 30 Minutes Intravenous Every 6 hours 02/24/20 0934 02/24/20 1009   02/24/20 1200  piperacillin-tazobactam (ZOSYN) IVPB 3.375 g  Status:  Discontinued        3.375 g 12.5 mL/hr over 240 Minutes Intravenous Every 8 hours 02/24/20 1009 02/24/20 1741   02/24/20 0245  piperacillin-tazobactam (ZOSYN) IVPB 3.375 g        3.375 g 100 mL/hr over 30 Minutes Intravenous  Once 02/24/20 0237 02/24/20 0535      Subjective: Today, patient was seen and examined at bedside. Seen after paracentesis. Abdominal pain on palpation. Denies cough, fever, vomiting.   Objective: Vitals:   03/05/20 2010 03/06/20 0453  BP: (!) 130/90 124/81  Pulse: 86 82  Resp: 18 18  Temp: 98.7 F (37.1 C) 99.1 F (37.3 C)  SpO2: 96% 95%    Intake/Output Summary (Last 24 hours) at 03/06/2020 0736 Last data filed at 03/06/2020 0453 Gross per 24 hour    Intake 2320 ml  Output 3225 ml  Net -905 ml   Filed Weights   02/27/20 0351 03/03/20 0439  Weight: (!) 97 kg 91.8 kg   Body mass index is 29.89 kg/m.   Physical Exam:  GENERAL: alert, awake and oriented. HENT: neck supple . Pupils equally reactive to light. Oral mucosa is moist NECK: is supple, no gross swelling noted. CHEST: Clear to auscultation. No crackles or wheezes.  Diminished breath sounds bilaterally. CVS: S1 and S2 heard, no murmur. Regular rate and rhythm.  ABDOMEN: soft, mild distention, diffuse tenderness on palpation, ascites. EXTREMITIES: No edema. CNS: Cranial nerves are intact. No focal motor deficits. SKIN: warm and dry without rashes.  Data Review: I have personally reviewed the following laboratory data and studies,  CBC: Recent Labs  Lab 02/29/20 0344 03/01/20 0836 03/02/20 0336  WBC 14.9*  12.1* 10.6*  NEUTROABS 11.7* 7.8* 6.9  HGB 14.2 14.8 13.9  HCT 41.2 43.6 41.6  MCV 93.8 95.6 96.3  PLT 302 357 382   Basic Metabolic Panel: Recent Labs  Lab 02/29/20 0344 02/29/20 0344 03/01/20 0836 03/02/20 0336 03/03/20 1039 03/04/20 0854 03/05/20 0326  NA 132*   < > 134* 136 134* 134* 134*  K 3.8   < > 3.9 4.0 4.2 4.4 4.3  CL 100   < > 102 102 100 103 101  CO2 25   < > 23 25 24  21* 26  GLUCOSE 103*   < > 139* 109* 110* 118* 112*  BUN 9   < > 8 10 9 10 10   CREATININE 0.90   < > 0.90 0.93 0.95 0.88 0.92  CALCIUM 7.8*   < > 8.3* 8.5* 8.5* 8.7* 8.6*  MG 1.8  --  1.7 1.7  --   --   --    < > = values in this interval not displayed.   Liver Function Tests: Recent Labs  Lab 03/01/20 0836 03/02/20 0336 03/03/20 1039 03/04/20 0854 03/05/20 0326  AST 58* 47* 45* 47* 50*  ALT 39 34 32 29 30  ALKPHOS 112 112 112 121 328*  BILITOT 2.8* 2.4* 2.3* 2.6* 2.8*  PROT 6.4* 6.0* 6.4* 7.0 6.5  ALBUMIN 2.2* 2.1* 2.4* 2.6* 2.4*   Recent Labs  Lab 02/29/20 1306 03/05/20 0326  LIPASE 58* 61*   Recent Labs  Lab 03/04/20 0854  AMMONIA 33   Cardiac  Enzymes: No results for input(s): CKTOTAL, CKMB, CKMBINDEX, TROPONINI in the last 168 hours. BNP (last 3 results) Recent Labs    02/29/20 0344 03/01/20 0836 03/02/20 0336  BNP 62.9 98.0 102.7*    ProBNP (last 3 results) No results for input(s): PROBNP in the last 8760 hours.  CBG: No results for input(s): GLUCAP in the last 168 hours. Recent Results (from the past 240 hour(s))  Gram stain     Status: None   Collection Time: 02/25/20  3:21 PM   Specimen: Abdomen  Result Value Ref Range Status   Specimen Description ABDOMEN  Final   Special Requests PERITONEAL FLUID  Final   Gram Stain   Final    ABUNDANT WBC PRESENT, PREDOMINANTLY PMN NO ORGANISMS SEEN Performed at [facility], [address], [place], [place] [address]    Report Status 02/25/2020 FINAL  Final  Culture, body fluid-bottle     Status: None   Collection Time: 02/25/20  3:21 PM   Specimen: Abdomen  Result Value Ref Range Status   Specimen Description ABDOMEN  Final   Special Requests PERITONEAL FLUID  Final   Culture   Final    NO GROWTH 8 DAYS Performed at [facility] [address] [place], [place] [address]    Report Status 03/04/2020 FINAL  Final     Studies: No results found.    Flora Lipps, MD  [facility] 03/06/2020

## 2020-03-06 NOTE — Progress Notes (Signed)
PT Cancellation Note  Patient Details Name: Mareon Robinette MRN: [id number] DOB: November 03, 1960   Cancelled Treatment:    Reason Eval/Treat Not Completed: Patient declined, no reason specified. States he will walk when he gets home. Interpreter Gabriela # [phone] utilized. Patient has refused the last 3 attempts. Will sign off at this time.    Janequa Kipnis 03/06/2020, 1:38 PM

## 2020-03-07 DIAGNOSIS — B964 Proteus (mirabilis) (morganii) as the cause of diseases classified elsewhere: Secondary | ICD-10-CM

## 2020-03-07 DIAGNOSIS — K7460 Unspecified cirrhosis of liver: Secondary | ICD-10-CM

## 2020-03-07 DIAGNOSIS — R188 Other ascites: Secondary | ICD-10-CM

## 2020-03-07 DIAGNOSIS — A4151 Sepsis due to Escherichia coli [E. coli]: Secondary | ICD-10-CM

## 2020-03-07 DIAGNOSIS — B961 Klebsiella pneumoniae [K. pneumoniae] as the cause of diseases classified elsewhere: Secondary | ICD-10-CM

## 2020-03-07 LAB — COMPREHENSIVE METABOLIC PANEL
ALT: 25 U/L (ref 0–44)
AST: 46 U/L — ABNORMAL HIGH (ref 15–41)
Albumin: 2.5 g/dL — ABNORMAL LOW (ref 3.5–5.0)
Alkaline Phosphatase: 200 U/L — ABNORMAL HIGH (ref 38–126)
Anion gap: 9 (ref 5–15)
BUN: 8 mg/dL (ref 6–20)
CO2: 23 mmol/L (ref 22–32)
Calcium: 8.3 mg/dL — ABNORMAL LOW (ref 8.9–10.3)
Chloride: 97 mmol/L — ABNORMAL LOW (ref 98–111)
Creatinine, Ser: 0.81 mg/dL (ref 0.61–1.24)
GFR calc Af Amer: >60 mL/min (ref >60)
GFR calc non Af Amer: >60 mL/min (ref >60)
Glucose, Bld: 118 mg/dL — ABNORMAL HIGH (ref 70–99)
Potassium: 4 mmol/L (ref 3.5–5.1)
Sodium: 129 mmol/L — ABNORMAL LOW (ref 135–145)
Total Bilirubin: 2.2 mg/dL — ABNORMAL HIGH (ref 0.3–1.2)
Total Protein: 7 g/dL (ref 6.5–8.1)

## 2020-03-07 LAB — MAGNESIUM: Magnesium: 1.8 mg/dL (ref 1.7–2.4)

## 2020-03-07 LAB — CYTOLOGY - NON PAP

## 2020-03-07 LAB — PHOSPHORUS: Phosphorus: 3.3 mg/dL (ref 2.5–4.6)

## 2020-03-07 NOTE — Consult Note (Signed)
[facility] for Infectious Disease    Date of Admission:  02/23/2020     Total days of antibiotics 13               Reason for Consult: Spontaneous   Referring Provider: Pokhrel  Primary Care Provider: Patient, No Pcp Per    ASSESSMENT:  Micheal Watson is a 59 y/o male with liver cirrhosis now complicated with spontaneous bacterial peritonitis with peritonieal fluid cultures positive for E. Coli, Klebsiella pneumoniae, and Proteus Penneri. Currently on Day 13 of antimicrobial therapy with ceftriaxone and metronidazole. Most recent fluid count with WBC count of 950 and neutrophil count of 57% which is down from 92%. Cultures of fluid are without growth. This data supports resolving SBP despite total WBC increasing. Would consider stopping ceftriaxone shortly as cultures are negative and generally requires only about 10 days of therapy on average. Recommend continuation for the next 24 hours and will consider stopping and monitoring.    PLAN:  1. Continue ceftriaxone and metronidazole for the next 24 hours then monitor off antibiotics. 2. Cirrhosis management and paracentesis per primary team and Gastroenterology.    Principal Problem:   Sepsis following intra-abdominal surgery ([place]) Active Problems:   Hyperbilirubinemia   Essential hypertension   Alcohol dependence with uncomplicated withdrawal ([facility])    enoxaparin (LOVENOX) injection  50 mg Subcutaneous Y78G   folic acid  1 mg Oral Daily   furosemide  40 mg Oral Daily   multivitamin with minerals  1 tablet Oral Daily   pantoprazole  40 mg Oral Daily   sodium chloride flush  3 mL Intravenous Q12H   spironolactone  50 mg Oral Daily   thiamine  100 mg Oral Daily     HPI: Micheal Watson is a 59 y.o. male with with previous medical history of alcoholism and recent acute cholecystitis complicated by biliary cystitis s/p common bile duct stenting and JP drain removal admitted with worsening abdominal pain  going on for about 2 days with associated yellowing of his skin and eyes.   CT abdomen on admission with small ill-defined hypodensities of the right hepatic lobe with possible cause being infection/small abscess with increased small to moderate volume of ascites. GI performed ERCP finding a stricture and the upper third of main bile duct and common hepatic duct were moderately dilated.One stent was remove from the biliary tree. Blood cultures were found to be polymicrobial bacteremia with E. Coli, K. Pneunmoniae and P. Penneri.  There was no ESBL noted for the E. Coli.  Micheal Watson underwent paracentesis on 7/23 which had 506 nucleated cells, 92% neutrophils and 1 % monocytes. Repeat paracentesis performed on 8/2 with 950 total nucleated cells, 57% neutrophils and 32% monocytes. Has been afebrile since admission and leukocytosis is improved with most recent being 10.6. ID has been asked for antibiotic recommendations with concern for persistent spontaneous bacterial peritonitis.   Micheal Watson speaks primarily [employer] and [place] services was used to aid in communication. Continues to have abdominal pain when laying flat and walking on occasion. Overall feeling better since arrival to the hospital. Denies any fevers or chills.  Review of Systems: Review of Systems  Constitutional: Negative for chills, fever and weight loss.  Respiratory: Negative for cough, shortness of breath and wheezing.   Cardiovascular: Negative for chest pain and leg swelling.  Gastrointestinal: Positive for abdominal pain. Negative for constipation, diarrhea, nausea and vomiting.  Skin: Negative for rash.     Past Medical  History:  Diagnosis Date   Hepatitis    Hyperlipidemia    Hypertension    Pneumonia    Pre-diabetes     Social History   Tobacco Use   Smoking status: Former Smoker    Packs/day: 0.30    Types: Cigarettes    Quit date: 10/08/2018    Years since quitting: 1.4    Smokeless tobacco: Never Used  Substance Use Topics   Alcohol use: Yes    Alcohol/week: 2.0 standard drinks    Types: 2 Cans of beer per week   Drug use: Not on file    Family History  Problem Relation Age of Onset   CVA Father     Allergies  Allergen Reactions   Pork-Derived Products Other (See Comments)    "I have pain in my stomach if I eat this"    OBJECTIVE: Blood pressure 126/80, pulse 86, temperature 99.2 F (37.3 C), temperature source Oral, resp. rate 19, height 5\' 9"  (1.753 m), weight 91.8 kg, SpO2 95 %.  Physical Exam Constitutional:      General: He is not in acute distress.    Appearance: He is well-developed.  Cardiovascular:     Rate and Rhythm: Normal rate and regular rhythm.     Heart sounds: Normal heart sounds.  Pulmonary:     Effort: Pulmonary effort is normal.     Breath sounds: Normal breath sounds.  Abdominal:     Palpations: There is fluid wave and hepatomegaly. There is no mass or pulsatile mass.     Tenderness: There is abdominal tenderness in the right upper quadrant.  Skin:    General: Skin is warm and dry.  Neurological:     Mental Status: He is alert and oriented to person, place, and time.  Psychiatric:        Behavior: Behavior normal.        Thought Content: Thought content normal.        Judgment: Judgment normal.     Lab Results Lab Results  Component Value Date   WBC 10.6 (H) 03/02/2020   HGB 13.9 03/02/2020   HCT 41.6 03/02/2020   MCV 96.3 03/02/2020   PLT 326 03/02/2020    Lab Results  Component Value Date   CREATININE 0.81 03/07/2020   BUN 8 03/07/2020   NA 129 (L) 03/07/2020   K 4.0 03/07/2020   CL 97 (L) 03/07/2020   CO2 23 03/07/2020    Lab Results  Component Value Date   ALT 25 03/07/2020   AST 46 (H) 03/07/2020   GGT 2,189 (H) 04/10/2019   ALKPHOS 200 (H) 03/07/2020   BILITOT 2.2 (H) 03/07/2020     Microbiology: Recent Results (from the past 240 hour(s))  Gram stain     Status: None  (Preliminary result)   Collection Time: 03/06/20 10:05 AM   Specimen: Abdomen; Peritoneal Fluid  Result Value Ref Range Status   Specimen Description PERITONEAL FLUID  Final   Special Requests ABDOMEN  Final   Gram Stain   Final    CYTOSPIN SMEAR WBC PRESENT,BOTH PMN AND MONONUCLEAR NO ORGANISMS SEEN Performed at [facility], [address], [place], [place] [address]    Report Status PENDING  Incomplete     Terri Piedra, [facility]  03/07/2020  12:11 PM

## 2020-03-07 NOTE — Progress Notes (Signed)
Paracentesis consistent with ongoing SBP, though his abdominal exam is benign and though he remains on ceftriaxone.  Suggest:  1.  ID consult for assistance with management of refractory SBP. 2.  Defer further work-up (MRCP, possible EUS/repeat ERCP) until patient's SBP has been resolved on follow up paracentesis. 3.  [facility] will follow.

## 2020-03-07 NOTE — Progress Notes (Signed)
PROGRESS NOTE  Micheal Watson NAT:[id number] DOB: 04/12/1961 DOA: 02/23/2020 PCP: Patient, No Pcp Per   LOS: 12 days   Brief narrative: As per HPI,  Micheal Watson is a 59 y.o. male with medical history significant of ETOH dependence; recent acute cholecystitis/cholangitis s/p CBD stent and JP drain placement. He presented to hospital this time with recurrent abdominal pain nausea vomiting and fever.  Patient continues to drink alcohol.  In the ED, patient was noted to have ascites.  Bilirubin was elevated at 19 from 0.8.  Patient received code sepsis, was given vancomycin and Zosyn and was admitted hospital for further evaluation and treatment.   Assessment/Plan:  Principal Problem:   Sepsis following intra-abdominal surgery ([place]) Active Problems:   Hyperbilirubinemia   Essential hypertension   Alcohol dependence with uncomplicated withdrawal ([place])   Sepsis secondary to spontaneous bacterial peritonitis.  Status post intra-abdominal surgery with current hyperbilirubinemia and obstructed CBD stent along with small Hepatic abscess. Patient with history of  acute cholecystitis with hydrops; underwent laparoscopic cholecystectomy with postoperative bile leak 11/22/2019 . Patient did have CBD stricture with ERCP with stent placement 11/29/2019 by Dr. Clarene Essex.  On 12/07/2019, patient underwent diagnostic laparoscopy with evacuation of bilious ascites(3L), JP drain placement 12/07/2019 Dr. Donne Hazel. Drain removal 12/23/2019. ERCP by Dr. Paulita Fujita on 02/24/2020 with CBD biliary stent stricture, stent removal and new stent placed.  Biopsy sent. Ultrasound-guided paracentesis on 02/24/2021 with 2.2 L of fluid removed which showed SBP. Another ultrasound-guided paracentesis on 7/26 with 1 liters of fluid drained, remains significant for SBP. Repeat CT scan of abdomen pelvis ordered on 02/28/2020 -showed liver abscesses with largest one being 1.1 cm.  IR was been consulted, collections are too small  to drain.  Patient did undergo diagnostic tapping again on 03/06/20 with features of SBP.  Currently on Lasix, Aldactone.  GI has seen the patient today and recommend ID consultation for refractory SBP.  Communicated with Dr. Drucilla Schmidt for consultation.  Monitor CMP.  Biliary stricture.  GI recommending ID consultation now.  Initial plan was MRI and endoscopic ultrasound.  We will continue to follow LFT.  Hyponatremia.  Secondary to alcohol abuse.  Sodium of 129 today.   Sepsis due to polymicrobial gram-negative bacteremia caused by SBP/  Hepatic abscesses E. coli, Klebsiella and Proteus  Bacteremia from blood culture of 02/24/20. GI and general surgery on board. On Rocephin and Flagyl. A repeat CT scan suggests a 1.1 cm liver abscess with multiple small foci, previous provider had discussed with ID about antibiotic recommendation, recommendation for Augmentin on discharge for another 2 weeks but GI recommends ID to review.  IV antibiotics given he still having significant abdominal tenderness to palpation, and recurrent ascites.  ultrasound-guided paracentesis consistent with SBP.    Fluid cultures however negative.  History of EtOH dependence.  Current usage.  Counseling done. No withdrawal symptoms.    DVT prophylaxis: SCDs Start: 02/24/20 0934   Code Status: Full code  Family Communication: none  Status is: Inpatient  Remains inpatient appropriate because:IV treatments appropriate due to intensity of illness or inability to take PO and Inpatient level of care appropriate due to severity of illness, GI intervention/ IR procedure, ID consultation   Dispo: The patient is from: Home              Anticipated d/c is to: Home              Anticipated d/c date is: 2-3 days  Patient currently is not medically stable to d/c.   Consultants:  GI,   General surgery  ID  Procedures:  Paracentesis  ERCP  Antibiotics:  . Rocephin, metronidazole IV  Anti-infectives (From  admission, onward)   Start     Dose/Rate Route Frequency Ordered Stop   03/03/20 2200  cefTRIAXone (ROCEPHIN) 2 g in sodium chloride 0.9 % 100 mL IVPB     Discontinue     2 g 200 mL/hr over 30 Minutes Intravenous Every 24 hours 03/03/20 1010     03/03/20 1200  metroNIDAZOLE (FLAGYL) IVPB 500 mg     Discontinue     500 mg 100 mL/hr over 60 Minutes Intravenous Every 8 hours 03/03/20 1010     03/03/20 1000  amoxicillin-clavulanate (AUGMENTIN) 875-125 MG per tablet 1 tablet  Status:  Discontinued        1 tablet Oral Every 12 hours 03/03/20 0735 03/03/20 1010   02/25/20 2000  cefTRIAXone (ROCEPHIN) 2 g in sodium chloride 0.9 % 100 mL IVPB  Status:  Discontinued        2 g 200 mL/hr over 30 Minutes Intravenous Every 24 hours 02/24/20 1847 03/03/20 0735   02/25/20 1515  metroNIDAZOLE (FLAGYL) IVPB 500 mg  Status:  Discontinued        500 mg 100 mL/hr over 60 Minutes Intravenous Every 8 hours 02/25/20 1455 03/03/20 0735   02/24/20 1830  cefTRIAXone (ROCEPHIN) 2 g in sodium chloride 0.9 % 100 mL IVPB  Status:  Discontinued        2 g 200 mL/hr over 30 Minutes Intravenous Every 24 hours 02/24/20 1741 02/24/20 1847   02/24/20 1200  piperacillin-tazobactam (ZOSYN) IVPB 3.375 g  Status:  Discontinued        3.375 g 100 mL/hr over 30 Minutes Intravenous Every 6 hours 02/24/20 0934 02/24/20 1009   02/24/20 1200  piperacillin-tazobactam (ZOSYN) IVPB 3.375 g  Status:  Discontinued        3.375 g 12.5 mL/hr over 240 Minutes Intravenous Every 8 hours 02/24/20 1009 02/24/20 1741   02/24/20 0245  piperacillin-tazobactam (ZOSYN) IVPB 3.375 g        3.375 g 100 mL/hr over 30 Minutes Intravenous  Once 02/24/20 0237 02/24/20 0535     Subjective: Today, patient was seen and examined at bedside.  Used interpreter service for history taking.  Patient complains of the abdominal pain especially on turning on one side.  Denies any nausea vomiting fever chills cough shortness of breath.  Objective: Vitals:    03/06/20 2052 03/07/20 0427  BP: 129/83 126/80  Pulse: 88 86  Resp: 16 19  Temp: 99.6 F (37.6 C) 99.2 F (37.3 C)  SpO2: 97% 95%    Intake/Output Summary (Last 24 hours) at 03/07/2020 1038 Last data filed at 03/07/2020 0500 Gross per 24 hour  Intake 1451.96 ml  Output 550 ml  Net 901.96 ml   Filed Weights   02/27/20 0351 03/03/20 0439  Weight: (!) 97 kg 91.8 kg   Body mass index is 29.89 kg/m.   Physical Exam:  GENERAL: alert, awake and oriented.  Not in obvious distress. HENT: neck supple . Pupils equally reactive to light. Oral mucosa is moist NECK: is supple, no gross swelling noted. CHEST: Clear to auscultation. No crackles or wheezes.  Diminished breath sounds bilaterally. CVS: S1 and S2 heard, no murmur. Regular rate and rhythm.  ABDOMEN: soft, , diffuse tenderness on palpation mostly on the right side, ascites. EXTREMITIES: No edema. CNS: Cranial  nerves are intact. No focal motor deficits. SKIN: warm and dry without rashes.  Data Review: I have personally reviewed the following laboratory data and studies,  CBC: Recent Labs  Lab 03/01/20 0836 03/02/20 0336  WBC 12.1* 10.6*  NEUTROABS 7.8* 6.9  HGB 14.8 13.9  HCT 43.6 41.6  MCV 95.6 96.3  PLT 357 295   Basic Metabolic Panel: Recent Labs  Lab 03/01/20 0836 03/01/20 0836 03/02/20 0336 03/03/20 1039 03/04/20 0854 03/05/20 0326 03/07/20 0622  NA 134*   < > 136 134* 134* 134* 129*  K 3.9   < > 4.0 4.2 4.4 4.3 4.0  CL 102   < > 102 100 103 101 97*  CO2 23   < > 25 24 21* 26 23  GLUCOSE 139*   < > 109* 110* 118* 112* 118*  BUN 8   < > 10 9 10 10 8   CREATININE 0.90   < > 0.93 0.95 0.88 0.92 0.81  CALCIUM 8.3*   < > 8.5* 8.5* 8.7* 8.6* 8.3*  MG 1.7  --  1.7  --   --   --  1.8  PHOS  --   --   --   --   --   --  3.3   < > = values in this interval not displayed.   Liver Function Tests: Recent Labs  Lab 03/02/20 0336 03/03/20 1039 03/04/20 0854 03/05/20 0326 03/07/20 0622  AST 47* 45* 47* 50*  46*  ALT 34 32 29 30 25   ALKPHOS 112 112 121 328* 200*  BILITOT 2.4* 2.3* 2.6* 2.8* 2.2*  PROT 6.0* 6.4* 7.0 6.5 7.0  ALBUMIN 2.1* 2.4* 2.6* 2.4* 2.5*   Recent Labs  Lab 02/29/20 1306 03/05/20 0326  LIPASE 58* 61*   Recent Labs  Lab 03/04/20 0854  AMMONIA 33   Cardiac Enzymes: No results for input(s): CKTOTAL, CKMB, CKMBINDEX, TROPONINI in the last 168 hours. BNP (last 3 results) Recent Labs    02/29/20 0344 03/01/20 0836 03/02/20 0336  BNP 62.9 98.0 102.7*    ProBNP (last 3 results) No results for input(s): PROBNP in the last 8760 hours.  CBG: No results for input(s): GLUCAP in the last 168 hours. Recent Results (from the past 240 hour(s))  Gram stain     Status: None (Preliminary result)   Collection Time: 03/06/20 10:05 AM   Specimen: Abdomen; Peritoneal Fluid  Result Value Ref Range Status   Specimen Description PERITONEAL FLUID  Final   Special Requests ABDOMEN  Final   Gram Stain   Final    CYTOSPIN SMEAR WBC PRESENT,BOTH PMN AND MONONUCLEAR NO ORGANISMS SEEN Performed at [facility], [address] [place], [place] [address]    Report Status PENDING  Incomplete     Studies: IR Paracentesis  Result Date: 03/06/2020 INDICATION: Ascites secondary to alcoholic cirrhosis. Request for diagnostic and therapeutic paracentesis. EXAM: ULTRASOUND GUIDED PARACENTESIS MEDICATIONS: 1% lidocaine 15 mL COMPLICATIONS: None immediate. PROCEDURE: Informed written consent was obtained from the patient after a discussion of the risks, benefits and alternatives to treatment. A timeout was performed prior to the initiation of the procedure. Initial ultrasound scanning demonstrates a moderate amount of ascites within the left lateral abdomen. The left lateral abdomen was prepped and draped in the usual sterile fashion. 1% lidocaine was used for local anesthesia. Following this, a 19 gauge, 7-cm, Yueh catheter was introduced. An ultrasound image was saved for documentation  purposes. The paracentesis was performed. The catheter was  removed and a dressing was applied. The patient tolerated the procedure well without immediate post procedural complication. FINDINGS: A total of approximately 1.1 L of clear yellow fluid was removed. Samples were sent to the laboratory as requested by the clinical team. IMPRESSION: Successful ultrasound-guided paracentesis yielding 1.1 L of peritoneal fluid. Read by: Gareth Eagle, PA-C Electronically Signed   By: Jerilynn Mages.  Shick M.D.   On: 03/06/2020 10:15      Flora Lipps, MD  [facility] 03/07/2020

## 2020-03-08 ENCOUNTER — Inpatient Hospital Stay (HOSPITAL_COMMUNITY): Payer: Self-pay

## 2020-03-08 DIAGNOSIS — R7881 Bacteremia: Secondary | ICD-10-CM

## 2020-03-08 DIAGNOSIS — Z1639 Resistance to other specified antimicrobial drug: Secondary | ICD-10-CM

## 2020-03-08 LAB — GRAM STAIN

## 2020-03-08 LAB — TOTAL BILIRUBIN, BODY FLUID: Total bilirubin, fluid: 1.5 mg/dL

## 2020-03-08 MED ORDER — IOHEXOL 9 MG/ML PO SOLN: 500.0000 mL | ORAL | Status: AC

## 2020-03-08 MED ORDER — IOHEXOL 300 MG/ML  SOLN
100.0000 mL | Freq: Once | INTRAMUSCULAR | Status: AC | PRN
  Administered 2020-03-08: 100 mL via INTRAVENOUS

## 2020-03-08 NOTE — Plan of Care (Signed)
  Problem: Education: Goal: Knowledge of General Education information will improve Description: Including pain rating scale, medication(s)/side effects and non-pharmacologic comfort measures Outcome: Progressing   Problem: Health Behavior/Discharge Planning: Goal: Ability to manage health-related needs will improve Outcome: Progressing   Problem: Clinical Measurements: Goal: Ability to maintain clinical measurements within normal limits will improve Outcome: Progressing   Problem: Activity: Goal: Risk for activity intolerance will decrease Outcome: Progressing   Problem: Safety: Goal: Ability to remain free from injury will improve Outcome: Progressing   Problem: Skin Integrity: Goal: Risk for impaired skin integrity will decrease Outcome: Progressing

## 2020-03-08 NOTE — Progress Notes (Signed)
PROGRESS NOTE    Micheal Watson  RCV:[id number] DOB: 12/18/1960 DOA: 02/23/2020 PCP: Patient, No Pcp Per   Brief Narrative: Micheal Watson is a 59 y.o. male with a history of ethanol abuse, recent cholecystitis/cholangitis status post CBD stent and JP drain placement.  Patient presented secondary to worsening abdominal pain, nausea, fever.  Patient found to have evidence of sepsis on admission with concern for SBP and found to have associated bacteremia and hepatic abscess.  Also noted to have biliary stricture which was managed by GI with placement of the stent.  Currently on IV antibiotics for the treatment of SBP and bacteremia with concern of possible worsening liver abscess.   Assessment & Plan:   Principal Problem:   Sepsis following intra-abdominal surgery ([place]) Active Problems:   Hyperbilirubinemia   Essential hypertension   Alcohol dependence with uncomplicated withdrawal ([place])   Sepsis Present on admission.  Concern for possible SBP confirmed by cell count from paracentesis.  Patient was initially managed on Zosyn which was transitioned to  ceftriaxone and Flagyl IV.  SBP In setting of recent abdominal surgery with associated obstructed CBD stent.  Patient underwent multiple paracenteses with fluid analysis consistent with SBP.  Previous body fluid cultures appear to have been canceled.  Only most recent fluid culture from 8/2 is resulting in no growth to date.  ERCP performed on 7/22 by GI with stent removal and replacement.  Patient is currently on ceftriaxone and Flagyl IV for treatment of SBP.  Patient with down trended white blood cell count. -Continue ceftriaxone and Flagyl -Infectious disease recommendations  Hepatic abscess Seen on recent CT scan from 7/26.  Patient still has discontinued right upper quadrant abdominal pain and spiked spiked fever 100.4 F overnight.  Leukocytosis is persistent but mild.  Discussed with infectious disease and will repeat  CT abdomen to reassess hepatic abscess. -CT abdomen  Biliary stricture Patient underwent ERCP as mentioned above.  Stent placement tomorrow mentioned above.  Hyponatremia Secondary to alcohol use.  Mostly mild.  Slight decrease on 8/3.  Asymptomatic  Polymicrobial bacteremia In setting of SBP.  Blood cultures significant for Proteus penneri, E. coli, Klebsiella pneumonia.  Patient is on antibiotics as mentioned above.  Alcohol abuse No current withdrawal symptoms.   DVT prophylaxis: Lovenox Code Status:   Code Status: Full Code Family Communication: None at bedside Disposition Plan: Discharge home pending continued work-up and management SBP/hepatic abscess   Consultants:   Gastroenterology  Infectious disease  Interventional radiology  Procedures:   ERCP (7/22) Impression:               - The upper third of the main bile duct and common                            hepatic duct were moderately dilated, secondary to                            a stricture.                           - One stent was removed from the biliary tree.                           - Cells for cytology obtained in the upper third of  the main bile duct.                           - One temporary stent was placed into the common                            bile duct.  Recommendation:           - Avoid aspirin and nonsteroidal anti-inflammatory                            medicines for 3 days.                           - Watch for pancreatitis, bleeding, perforation,                            and cholangitis.                           - Observe patient's clinical course.                           - Continue antibiotics.                           - May need paracentesis, pending clinical course.                           - May need EUS/cholangioscopy, [place]the-road,                            pending clinical course.                           - Alcohol cessation.                            - Clear liquid diet today.                           [name] will follow.   PARACENTESIS (7/23, 7/26, 8/2)  Antimicrobials:  Zosyn IV  Ceftriaxone IV  Metronidazole IV   Subjective: Interpreter: Fabio Bering #[id number]  Some pain of right flank worse with movement. Pain is worse with pressure when he pushes on the area. Pain is non-radiating and he is unable to describe the quality. No other issues overnight. Patient febrile overnight.  Objective: Vitals:   03/07/20 0427 03/07/20 1506 03/07/20 2027 03/08/20 0440  BP: 126/80 119/75 111/73 124/80  Pulse: 86 91 90 85  Resp: 19 18 17 17   Temp: 99.2 F (37.3 C) (!) 100.4 F (38 C) 98.5 F (36.9 C) 99.1 F (37.3 C)  TempSrc: Oral Oral  Oral  SpO2: 95% 94% 95% 96%  Weight:      Height:        Intake/Output Summary (Last 24 hours) at 03/08/2020 1027 Last data filed at 03/08/2020 0440 Gross per 24 hour  Intake 1986.97 ml  Output 1400 ml  Net 586.97 ml   Filed Weights   02/27/20 0351  03/03/20 0439  Weight: (!) 97 kg 91.8 kg    Examination:  General exam: Appears calm and comfortable HEENT: Scleral icterus Respiratory system: Clear to auscultation. Respiratory effort normal. Cardiovascular system: S1 & S2 heard, RRR. No murmurs, rubs, gallops or clicks. Gastrointestinal system: Abdomen is nondistended, soft and nontender. No organomegaly or masses felt. Normal bowel sounds heard. Central nervous system: Alert and oriented. No focal neurological deficits. Musculoskeletal: No edema. No calf tenderness Skin: No cyanosis. No rashes Psychiatry: Judgement and insight appear normal. Mood & affect appropriate.     Data Reviewed: I have personally reviewed following labs and imaging studies  CBC Lab Results  Component Value Date   WBC 10.6 (H) 03/02/2020   RBC 4.32 03/02/2020   HGB 13.9 03/02/2020   HCT 41.6 03/02/2020   MCV 96.3 03/02/2020   MCH 32.2 03/02/2020   PLT 326 03/02/2020   MCHC 33.4 03/02/2020    RDW 16.1 (H) 03/02/2020   LYMPHSABS 2.3 03/02/2020   MONOABS 1.1 (H) 03/02/2020   EOSABS 0.1 03/02/2020   BASOSABS 0.1 18/84/1660     Last metabolic panel Lab Results  Component Value Date   NA 129 (L) 03/07/2020   K 4.0 03/07/2020   CL 97 (L) 03/07/2020   CO2 23 03/07/2020   BUN 8 03/07/2020   CREATININE 0.81 03/07/2020   GLUCOSE 118 (H) 03/07/2020   GFRNONAA >60 03/07/2020   GFRAA >60 03/07/2020   CALCIUM 8.3 (L) 03/07/2020   PHOS 3.3 03/07/2020   PROT 7.0 03/07/2020   ALBUMIN 2.5 (L) 03/07/2020   BILITOT 2.2 (H) 03/07/2020   ALKPHOS 200 (H) 03/07/2020   AST 46 (H) 03/07/2020   ALT 25 03/07/2020   ANIONGAP 9 03/07/2020    CBG (last 3)  No results for input(s): GLUCAP in the last 72 hours.   GFR: Estimated Creatinine Clearance: 109.9 mL/min (by C-G formula based on SCr of 0.81 mg/dL).  Coagulation Profile: Recent Labs  Lab 03/02/20 0336 03/04/20 0854  INR 1.2 1.1    Recent Results (from the past 240 hour(s))  Gram stain     Status: None (Preliminary result)   Collection Time: 03/06/20 10:05 AM   Specimen: Abdomen; Peritoneal Fluid  Result Value Ref Range Status   Specimen Description PERITONEAL FLUID  Final   Special Requests ABDOMEN  Final   Gram Stain   Final    CYTOSPIN SMEAR WBC PRESENT,BOTH PMN AND MONONUCLEAR NO ORGANISMS SEEN Performed at [facility], [address], [place], [place] [address]    Report Status PENDING  Incomplete  Culture, body fluid-bottle     Status: None (Preliminary result)   Collection Time: 03/06/20 10:05 AM   Specimen: Peritoneal Washings  Result Value Ref Range Status   Specimen Description PERITONEAL FLUID  Final   Special Requests ABDOMEN  Final   Culture   Final    NO GROWTH 2 DAYS Performed at [facility] [address] [place], [place] [address]    Report Status PENDING  Incomplete        Radiology Studies: No results found.      Scheduled Meds: . enoxaparin (LOVENOX) injection  50  mg Subcutaneous Q24H  . folic acid  1 mg Oral Daily  . furosemide  40 mg Oral Daily  . multivitamin with minerals  1 tablet Oral Daily  . pantoprazole  40 mg Oral Daily  . sodium chloride flush  3 mL Intravenous Q12H  . spironolactone  50 mg Oral  Daily  . thiamine  100 mg Oral Daily   Continuous Infusions: . sodium chloride Stopped (03/06/20 0419)  . albumin human    . cefTRIAXone (ROCEPHIN)  IV Stopped (03/07/20 2210)  . metronidazole 500 mg (03/08/20 0440)     LOS: 13 days     Cordelia Poche, MD [facility] 03/08/2020, 10:27 AM  If 7PM-7AM, please contact night-coverage [email or web address]

## 2020-03-08 NOTE — Progress Notes (Signed)
Subjective: C.o RUQ pain   Antibiotics:  Anti-infectives (From admission, onward)   Start     Dose/Rate Route Frequency Ordered Stop   03/03/20 2200  cefTRIAXone (ROCEPHIN) 2 g in sodium chloride 0.9 % 100 mL IVPB     Discontinue     2 g 200 mL/hr over 30 Minutes Intravenous Every 24 hours 03/03/20 1010     03/03/20 1200  metroNIDAZOLE (FLAGYL) IVPB 500 mg     Discontinue     500 mg 100 mL/hr over 60 Minutes Intravenous Every 8 hours 03/03/20 1010     03/03/20 1000  amoxicillin-clavulanate (AUGMENTIN) 875-125 MG per tablet 1 tablet  Status:  Discontinued        1 tablet Oral Every 12 hours 03/03/20 0735 03/03/20 1010   02/25/20 2000  cefTRIAXone (ROCEPHIN) 2 g in sodium chloride 0.9 % 100 mL IVPB  Status:  Discontinued        2 g 200 mL/hr over 30 Minutes Intravenous Every 24 hours 02/24/20 1847 03/03/20 0735   02/25/20 1515  metroNIDAZOLE (FLAGYL) IVPB 500 mg  Status:  Discontinued        500 mg 100 mL/hr over 60 Minutes Intravenous Every 8 hours 02/25/20 1455 03/03/20 0735   02/24/20 1830  cefTRIAXone (ROCEPHIN) 2 g in sodium chloride 0.9 % 100 mL IVPB  Status:  Discontinued        2 g 200 mL/hr over 30 Minutes Intravenous Every 24 hours 02/24/20 1741 02/24/20 1847   02/24/20 1200  piperacillin-tazobactam (ZOSYN) IVPB 3.375 g  Status:  Discontinued        3.375 g 100 mL/hr over 30 Minutes Intravenous Every 6 hours 02/24/20 0934 02/24/20 1009   02/24/20 1200  piperacillin-tazobactam (ZOSYN) IVPB 3.375 g  Status:  Discontinued        3.375 g 12.5 mL/hr over 240 Minutes Intravenous Every 8 hours 02/24/20 1009 02/24/20 1741   02/24/20 0245  piperacillin-tazobactam (ZOSYN) IVPB 3.375 g        3.375 g 100 mL/hr over 30 Minutes Intravenous  Once 02/24/20 0237 02/24/20 0535      Medications: Scheduled Meds: . enoxaparin (LOVENOX) injection  50 mg Subcutaneous Q24H  . folic acid  1 mg Oral Daily  . furosemide  40 mg Oral Daily  . multivitamin with minerals  1 tablet  Oral Daily  . pantoprazole  40 mg Oral Daily  . sodium chloride flush  3 mL Intravenous Q12H  . spironolactone  50 mg Oral Daily  . thiamine  100 mg Oral Daily   Continuous Infusions: . sodium chloride Stopped (03/06/20 0419)  . albumin human    . cefTRIAXone (ROCEPHIN)  IV Stopped (03/07/20 2210)  . metronidazole 500 mg (03/08/20 1239)   PRN Meds:.sodium chloride, albumin human, HYDROcodone-acetaminophen, lidocaine, [DISCONTINUED] ondansetron **OR** ondansetron (ZOFRAN) IV    Objective: Weight change:   Intake/Output Summary (Last 24 hours) at 03/08/2020 1327 Last data filed at 03/08/2020 1117 Gross per 24 hour  Intake 1886.97 ml  Output 1450 ml  Net 436.97 ml   Blood pressure 124/80, pulse 85, temperature 99.1 F (37.3 C), temperature source Oral, resp. rate 17, height 5\' 9"  (1.753 m), weight 91.8 kg, SpO2 96 %. Temp:  [98.5 F (36.9 C)-100.4 F (38 C)] 99.1 F (37.3 C) (08/04 0440) Pulse Rate:  [85-91] 85 (08/04 0440) Resp:  [17-18] 17 (08/04 0440) BP: (111-124)/(73-80) 124/80 (08/04 0440) SpO2:  [94 %-96 %] 96 % (08/04 0440)  Physical Exam: General: Alert and awake, oriented x3 chronically ill appearing HEENT: anicteric sclera, EOMI CVS regular rate, normal  Chest: , no wheezing, no respiratory distress Abdomen: distended and TTP in RUQ Skin: no rashes Neuro: nonfocal  CBC:    BMET Recent Labs    03/07/20 0622  NA 129*  K 4.0  CL 97*  CO2 23  GLUCOSE 118*  BUN 8  CREATININE 0.81  CALCIUM 8.3*     Liver Panel  Recent Labs    03/07/20 0622  PROT 7.0  ALBUMIN 2.5*  AST 46*  ALT 25  ALKPHOS 200*  BILITOT 2.2*       Sedimentation Rate No results for input(s): ESRSEDRATE in the last 72 hours. C-Reactive Protein No results for input(s): CRP in the last 72 hours.  Micro Results: Recent Results (from the past 720 hour(s))  Blood culture (routine x 2)     Status: Abnormal   Collection Time: 02/24/20  2:47 AM   Specimen: BLOOD  Result  Value Ref Range Status   Specimen Description BLOOD LEFT ANTECUBITAL  Final   Special Requests   Final    BOTTLES DRAWN AEROBIC AND ANAEROBIC Blood Culture adequate volume   Culture  Setup Time   Final    GRAM NEGATIVE RODS IN BOTH AEROBIC AND ANAEROBIC BOTTLES CRITICAL RESULT CALLED TO, READ BACK BY AND VERIFIED WITH: PHARMD MIHN P. [phone] FCP Performed at [facility], [place] [address], [place], [place] [address]    Culture (A)  Final    ESCHERICHIA COLI KLEBSIELLA PNEUMONIAE PROTEUS PENNERI    Report Status 02/27/2020 FINAL  Final   Organism ID, Bacteria ESCHERICHIA COLI  Final   Organism ID, Bacteria KLEBSIELLA PNEUMONIAE  Final   Organism ID, Bacteria PROTEUS PENNERI  Final      Susceptibility   Escherichia coli - MIC*    AMPICILLIN <=2 SENSITIVE Sensitive     CEFAZOLIN <=4 SENSITIVE Sensitive     CEFEPIME <=0.12 SENSITIVE Sensitive     CEFTAZIDIME <=1 SENSITIVE Sensitive     CEFTRIAXONE <=0.25 SENSITIVE Sensitive     CIPROFLOXACIN <=0.25 SENSITIVE Sensitive     GENTAMICIN <=1 SENSITIVE Sensitive     IMIPENEM <=0.25 SENSITIVE Sensitive     TRIMETH/SULFA <=20 SENSITIVE Sensitive     AMPICILLIN/SULBACTAM <=2 SENSITIVE Sensitive     PIP/TAZO <=4 SENSITIVE Sensitive     * ESCHERICHIA COLI   Klebsiella pneumoniae - MIC*    AMPICILLIN >=32 RESISTANT Resistant     CEFAZOLIN <=4 SENSITIVE Sensitive     CEFEPIME <=0.12 SENSITIVE Sensitive     CEFTAZIDIME <=1 SENSITIVE Sensitive     CEFTRIAXONE <=0.25 SENSITIVE Sensitive     CIPROFLOXACIN <=0.25 SENSITIVE Sensitive     GENTAMICIN <=1 SENSITIVE Sensitive     IMIPENEM <=0.25 SENSITIVE Sensitive     TRIMETH/SULFA <=20 SENSITIVE Sensitive     AMPICILLIN/SULBACTAM 4 SENSITIVE Sensitive     PIP/TAZO <=4 SENSITIVE Sensitive     * KLEBSIELLA PNEUMONIAE   Proteus penneri - MIC*    AMPICILLIN >=32 RESISTANT Resistant     CEFAZOLIN >=64 RESISTANT Resistant     CEFEPIME <=0.12 SENSITIVE Sensitive     CEFTAZIDIME <=1  SENSITIVE Sensitive     CEFTRIAXONE <=0.25 SENSITIVE Sensitive     CIPROFLOXACIN <=0.25 SENSITIVE Sensitive     GENTAMICIN <=1 SENSITIVE Sensitive     IMIPENEM 2 SENSITIVE Sensitive     TRIMETH/SULFA <=20 SENSITIVE Sensitive     AMPICILLIN/SULBACTAM 16 INTERMEDIATE Intermediate  PIP/TAZO <=4 SENSITIVE Sensitive     * PROTEUS PENNERI  Blood Culture ID Panel (Reflexed)     Status: Abnormal   Collection Time: 02/24/20  2:47 AM  Result Value Ref Range Status   Enterococcus species NOT DETECTED NOT DETECTED Final   Listeria monocytogenes NOT DETECTED NOT DETECTED Final   Staphylococcus species NOT DETECTED NOT DETECTED Final   Staphylococcus aureus (BCID) NOT DETECTED NOT DETECTED Final   Streptococcus species NOT DETECTED NOT DETECTED Final   Streptococcus agalactiae NOT DETECTED NOT DETECTED Final   Streptococcus pneumoniae NOT DETECTED NOT DETECTED Final   Streptococcus pyogenes NOT DETECTED NOT DETECTED Final   Acinetobacter baumannii NOT DETECTED NOT DETECTED Final   Enterobacteriaceae species DETECTED (A) NOT DETECTED Final    Comment: CRITICAL RESULT CALLED TO, READ BACK BY AND VERIFIED WITH: PHARMD MIHN P. [phone] FCP    Enterobacter cloacae complex NOT DETECTED NOT DETECTED Final   Escherichia coli DETECTED (A) NOT DETECTED Final    Comment: CRITICAL RESULT CALLED TO, READ BACK BY AND VERIFIED WITH: PHARMD MIHN P. [phone] FCP    Klebsiella oxytoca NOT DETECTED NOT DETECTED Final   Klebsiella pneumoniae DETECTED (A) NOT DETECTED Final    Comment: CRITICAL RESULT CALLED TO, READ BACK BY AND VERIFIED WITH: PHARMD MIHN P. [phone] FCP    Proteus species DETECTED (A) NOT DETECTED Final    Comment: CRITICAL RESULT CALLED TO, READ BACK BY AND VERIFIED WITH: PHARMD MIHN P. [phone] FCP    Serratia marcescens NOT DETECTED NOT DETECTED Final   Carbapenem resistance NOT DETECTED NOT DETECTED Final   Haemophilus influenzae NOT DETECTED NOT DETECTED Final   Neisseria  meningitidis NOT DETECTED NOT DETECTED Final   Pseudomonas aeruginosa NOT DETECTED NOT DETECTED Final   Candida albicans NOT DETECTED NOT DETECTED Final   Candida glabrata NOT DETECTED NOT DETECTED Final   Candida krusei NOT DETECTED NOT DETECTED Final   Candida parapsilosis NOT DETECTED NOT DETECTED Final   Candida tropicalis NOT DETECTED NOT DETECTED Final    Comment: Performed at [facility], [place]. [address], [place], [place] [address]  Blood culture (routine x 2)     Status: Abnormal   Collection Time: 02/24/20  3:56 AM   Specimen: BLOOD LEFT HAND  Result Value Ref Range Status   Specimen Description BLOOD LEFT HAND  Final   Special Requests   Final    BOTTLES DRAWN AEROBIC ONLY Blood Culture results may not be optimal due to an inadequate volume of blood received in culture bottles   Culture  Setup Time   Final    AEROBIC BOTTLE ONLY GRAM NEGATIVE RODS CRITICAL VALUE NOTED.  VALUE IS CONSISTENT WITH PREVIOUSLY REPORTED AND CALLED VALUE.    Culture (A)  Final    PROTEUS PENNERI SUSCEPTIBILITIES PERFORMED ON PREVIOUS CULTURE WITHIN THE LAST 5 DAYS. Performed at [facility], [place] [address] [place], [place] [address]    Report Status 02/27/2020 FINAL  Final  SARS Coronavirus 2 by RT PCR (hospital order, performed in [facility] hospital lab) Nasopharyngeal Nasopharyngeal Swab     Status: None   Collection Time: 02/24/20  6:04 AM   Specimen: Nasopharyngeal Swab  Result Value Ref Range Status   SARS Coronavirus 2 NEGATIVE NEGATIVE Final    Comment: (NOTE) SARS-CoV-2 target nucleic acids are NOT DETECTED.  The SARS-CoV-2 RNA is generally detectable in upper and lower respiratory specimens during the acute phase of infection. The lowest concentration of  SARS-CoV-2 viral copies this assay can detect is 250 copies / mL. A negative result does not preclude SARS-CoV-2 infection and should not be used as the sole basis for treatment or other patient management decisions.   A negative result may occur with improper specimen collection / handling, submission of specimen other than nasopharyngeal swab, presence of viral mutation(s) within the areas targeted by this assay, and inadequate number of viral copies (<250 copies / mL). A negative result must be combined with clinical observations, patient history, and epidemiological information.  Fact Sheet for Patients:   StrictlyIdeas.no  Fact Sheet for Healthcare Providers: BankingDealers.co.za  This test is not yet approved or  cleared by the [place] FDA and has been authorized for detection and/or diagnosis of SARS-CoV-2 by FDA under an Emergency Use Authorization (EUA).  This EUA will remain in effect (meaning this test can be used) for the duration of the COVID-19 declaration under Section 564(b)(1) of the Act, 21 U.S.C. section 360bbb-3(b)(1), unless the authorization is terminated or revoked sooner.  Performed at [facility], [place] [address], [place], [place] [address]   C Difficile Quick Screen w PCR reflex     Status: None   Collection Time: 02/25/20  4:21 AM   Specimen: STOOL  Result Value Ref Range Status   C Diff antigen NEGATIVE NEGATIVE Final   C Diff toxin NEGATIVE NEGATIVE Final   C Diff interpretation No C. difficile detected.  Final    Comment: Performed at [facility], [place] [address], [place], [place] [address]  Gastrointestinal Panel by PCR , Stool     Status: None   Collection Time: 02/25/20  4:21 AM   Specimen: STOOL  Result Value Ref Range Status   Campylobacter species NOT DETECTED NOT DETECTED Final   Plesimonas shigelloides NOT DETECTED NOT DETECTED Final   Salmonella species NOT DETECTED NOT DETECTED Final   Yersinia enterocolitica NOT DETECTED NOT DETECTED Final   Vibrio species NOT DETECTED NOT DETECTED Final   Vibrio cholerae NOT DETECTED NOT DETECTED Final   Enteroaggregative E coli (EAEC) NOT DETECTED NOT  DETECTED Final   Enteropathogenic E coli (EPEC) NOT DETECTED NOT DETECTED Final   Enterotoxigenic E coli (ETEC) NOT DETECTED NOT DETECTED Final   Shiga like toxin producing E coli (STEC) NOT DETECTED NOT DETECTED Final   Shigella/Enteroinvasive E coli (EIEC) NOT DETECTED NOT DETECTED Final   Cryptosporidium NOT DETECTED NOT DETECTED Final   Cyclospora cayetanensis NOT DETECTED NOT DETECTED Final   Entamoeba histolytica NOT DETECTED NOT DETECTED Final   Giardia lamblia NOT DETECTED NOT DETECTED Final   Adenovirus F40/41 NOT DETECTED NOT DETECTED Final   Astrovirus NOT DETECTED NOT DETECTED Final   Norovirus GI/GII NOT DETECTED NOT DETECTED Final   Rotavirus A NOT DETECTED NOT DETECTED Final   Sapovirus (I, II, IV, and V) NOT DETECTED NOT DETECTED Final    Comment: Performed at [facility], [place] [place], [place] [address]  MRSA PCR Screening     Status: None   Collection Time: 02/25/20  4:57 AM  Result Value Ref Range Status   MRSA by PCR NEGATIVE NEGATIVE Final    Comment:        The GeneXpert MRSA Assay (FDA approved for NASAL specimens only), is one component of a comprehensive MRSA colonization surveillance program. It is not intended to diagnose MRSA infection nor to guide or monitor treatment for MRSA infections. Performed at [facility], [place]  [address] [place], [place] [address]   Gram stain     Status: None   Collection Time: 02/25/20  3:21 PM   Specimen: Abdomen  Result Value Ref Range Status   Specimen Description ABDOMEN  Final   Special Requests PERITONEAL FLUID  Final   Gram Stain   Final    ABUNDANT WBC PRESENT, PREDOMINANTLY PMN NO ORGANISMS SEEN Performed at [facility], [place] [address] [place], [place] [address]    Report Status 02/25/2020 FINAL  Final  Culture, body fluid-bottle     Status: None   Collection Time: 02/25/20  3:21 PM   Specimen: Abdomen  Result Value Ref Range Status   Specimen Description ABDOMEN   Final   Special Requests PERITONEAL FLUID  Final   Culture   Final    NO GROWTH 8 DAYS Performed at [place] [address], [place], [place] [address]    Report Status 03/04/2020 FINAL  Final  Gram stain     Status: None (Preliminary result)   Collection Time: 03/06/20 10:05 AM   Specimen: Abdomen; Peritoneal Fluid  Result Value Ref Range Status   Specimen Description PERITONEAL FLUID  Final   Special Requests ABDOMEN  Final   Gram Stain   Final    CYTOSPIN SMEAR WBC PRESENT,BOTH PMN AND MONONUCLEAR NO ORGANISMS SEEN Performed at [facility], [place] [address], [place], [place] [address]    Report Status PENDING  Incomplete  Culture, body fluid-bottle     Status: None (Preliminary result)   Collection Time: 03/06/20 10:05 AM   Specimen: Peritoneal Washings  Result Value Ref Range Status   Specimen Description PERITONEAL FLUID  Final   Special Requests ABDOMEN  Final   Culture   Final    NO GROWTH 2 DAYS Performed at [facility] [address] [place],  [address]    Report Status PENDING  Incomplete    Studies/Results: No results found.    Assessment/Plan:  INTERVAL HISTORY: still w persistent low grade temps and CT previously had shown apparent liver abscesses   Principal Problem:   Sepsis following intra-abdominal surgery ([place]) Active Problems:   Hyperbilirubinemia   Essential hypertension   Alcohol dependence with uncomplicated withdrawal ([place])    Micheal Watson is a 59 y.o. male with  Polymicrobial bacteremia thought to be due to SBP but who appears on recent imaging to have several possible liver abscesses.  I suspect liver abscess is persisting may be the reason for his persistently abnormal peritoneal fluid low-grade fevers  I recommend repeat CT scan of the abdomen and pelvis and consultation with radiology to see if any of these are amenable to drainage so the material can be sent for culture.  He is going to  need protracted antibiotics for liver abscesses.  Unfortunately the Proteus that grew is fairly resistant to most oral agents besides fluoroquinolones and TMP/SMX  To ceftriaxone and metronidazole for now   LOS: 13 days   Alcide Evener 03/08/2020, 1:27 PM

## 2020-03-08 NOTE — Progress Notes (Signed)
Worsening abdominal pain over the past couple days.  ID recommending repeat CT scan.  I feel uncomfortable doing eus/ercp proceduresat this point until the etiology of his worsening abdominal pain and worsening WBC peritoneal fluid becomes clearer.  I doubt due to worsening biliary obstruction, given improving LFTs.  [facility] will follow.

## 2020-03-09 DIAGNOSIS — R1011 Right upper quadrant pain: Secondary | ICD-10-CM

## 2020-03-09 LAB — CBC
HCT: 40.8 % (ref 39.0–52.0)
Hemoglobin: 13.8 g/dL (ref 13.0–17.0)
MCH: 32.6 pg (ref 26.0–34.0)
MCHC: 33.8 g/dL (ref 30.0–36.0)
MCV: 96.5 fL (ref 80.0–100.0)
Platelets: 362 10*3/uL (ref 150–400)
RBC: 4.23 MIL/uL (ref 4.22–5.81)
RDW: 14.2 % (ref 11.5–15.5)
WBC: 9.8 10*3/uL (ref 4.0–10.5)
nRBC: 0 % (ref 0.0–0.2)

## 2020-03-09 LAB — BASIC METABOLIC PANEL
Anion gap: 10 (ref 5–15)
BUN: 10 mg/dL (ref 6–20)
CO2: 24 mmol/L (ref 22–32)
Calcium: 8.5 mg/dL — ABNORMAL LOW (ref 8.9–10.3)
Chloride: 98 mmol/L (ref 98–111)
Creatinine, Ser: 0.84 mg/dL (ref 0.61–1.24)
GFR calc Af Amer: >60 mL/min (ref >60)
GFR calc non Af Amer: >60 mL/min (ref >60)
Glucose, Bld: 110 mg/dL — ABNORMAL HIGH (ref 70–99)
Potassium: 4.3 mmol/L (ref 3.5–5.1)
Sodium: 132 mmol/L — ABNORMAL LOW (ref 135–145)

## 2020-03-09 MED ORDER — SULFAMETHOXAZOLE-TRIMETHOPRIM 800-160 MG PO TABS
1.0000 | ORAL_TABLET | Freq: Two times a day (BID) | ORAL | 0 refills | Status: DC
Start: 2020-03-09 — End: 2020-03-20

## 2020-03-09 MED ORDER — SULFAMETHOXAZOLE-TRIMETHOPRIM 800-160 MG PO TABS
1.0000 | ORAL_TABLET | Freq: Two times a day (BID) | ORAL | Status: DC
  Administered 2020-03-09 – 2020-03-10 (×3): 1 via ORAL
  Filled 2020-03-09 (×3): qty 1

## 2020-03-09 MED ORDER — AMOXICILLIN-POT CLAVULANATE 875-125 MG PO TABS: 1.0000 | ORAL_TABLET | Freq: Two times a day (BID) | ORAL | 0 refills | Status: DC

## 2020-03-09 MED ORDER — AMOXICILLIN-POT CLAVULANATE 875-125 MG PO TABS
1.0000 | ORAL_TABLET | Freq: Two times a day (BID) | ORAL | Status: DC
  Administered 2020-03-09 – 2020-03-10 (×3): 1 via ORAL
  Filled 2020-03-09 (×3): qty 1

## 2020-03-09 MED FILL — SULFAMETHOXAZOLE-TMP DS TAB: 800-160 | 30 days supply | Qty: 60 | Fill #0

## 2020-03-09 MED FILL — AMOX-CLAV 875-125 MG TABLET: 875-125 | 30 days supply | Qty: 60 | Fill #0

## 2020-03-09 NOTE — Plan of Care (Signed)
°  Problem: Education: Goal: Knowledge of General Education information will improve Description: Including pain rating scale, medication(s)/side effects and non-pharmacologic comfort measures Outcome: Progressing   Problem: Health Behavior/Discharge Planning: Goal: Ability to manage health-related needs will improve Outcome: Progressing   Problem: Clinical Measurements: Goal: Ability to maintain clinical measurements within normal limits will improve Outcome: Progressing Goal: Diagnostic test results will improve Outcome: Progressing Goal: Respiratory complications will improve Outcome: Progressing Goal: Cardiovascular complication will be avoided Outcome: Progressing   Problem: Activity: Goal: Risk for activity intolerance will decrease Outcome: Progressing   Problem: Nutrition: Goal: Adequate nutrition will be maintained Outcome: Progressing   Problem: Coping: Goal: Level of anxiety will decrease Outcome: Progressing   Problem: Elimination: Goal: Will not experience complications related to bowel motility Outcome: Progressing Goal: Will not experience complications related to urinary retention Outcome: Progressing   Problem: Safety: Goal: Ability to remain free from injury will improve Outcome: Progressing   Problem: Skin Integrity: Goal: Risk for impaired skin integrity will decrease Outcome: Progressing   

## 2020-03-09 NOTE — Progress Notes (Signed)
PROGRESS NOTE    Micheal Watson  DVV:[id number] DOB: Oct 08, 1960 DOA: 02/23/2020 PCP: Patient, No Pcp Per   Brief Narrative: Micheal Watson is a 59 y.o. male with a history of ethanol abuse, recent cholecystitis/cholangitis status post CBD stent and JP drain placement.  Patient presented secondary to worsening abdominal pain, nausea, fever.  Patient found to have evidence of sepsis on admission with concern for SBP and found to have associated bacteremia and hepatic abscess.  Also noted to have biliary stricture which was managed by GI with placement of the stent.  Currently on IV antibiotics for the treatment of SBP and bacteremia with concern of possible worsening liver abscess.   Assessment & Plan:   Principal Problem:   Sepsis following intra-abdominal surgery ([place]) Active Problems:   Hyperbilirubinemia   Essential hypertension   Alcohol dependence with uncomplicated withdrawal ([place])   Sepsis Present on admission.  Concern for possible SBP confirmed by cell count from paracentesis.  Patient was initially managed on Zosyn which was transitioned to  ceftriaxone and Flagyl IV.  SBP In setting of recent abdominal surgery with associated obstructed CBD stent.  Patient underwent multiple paracenteses with fluid analysis consistent with SBP.  Previous body fluid cultures appear to have been canceled.  Only most recent fluid culture from 8/2 is resulting in no growth to date.  ERCP performed on 7/22 by GI with stent removal and replacement.  Patient has been on ceftriaxone and Flagyl IV for treatment of SBP.  CT scan on 8/4 significant for changes consistent with peritonitis. Patient with down trended white blood cell count. -Infectious disease recommendations: Bactrim/Augmentin with plan for 30 day treatment  Hepatic abscess Seen on recent CT scan from 7/26.  Patient still has discontinued right upper quadrant abdominal pain and spiked spiked fever 100.4 F overnight.   Leukocytosis is persistent but mild.  CT abdomen shows improving abscesses.  Biliary stricture Patient underwent ERCP as mentioned above.  Stent placement tomorrow mentioned above.  Hyponatremia Secondary to alcohol use.  Mostly mild.  Slight decrease on 8/3.  Asymptomatic  Polymicrobial bacteremia In setting of SBP.  Blood cultures significant for Proteus penneri, E. coli, Klebsiella pneumonia.  Patient is on antibiotics as mentioned above.  Alcohol abuse No current withdrawal symptoms.   DVT prophylaxis: Lovenox Code Status:   Code Status: Full Code Family Communication: None at bedside Disposition Plan: Discharge home pending significant improvement of SBP as there is a concern for high morbidity if patient discharges and attempts to manage as an outpatient.   Consultants:   Gastroenterology  Infectious disease  Interventional radiology  Procedures:   ERCP (7/22) Impression:               - The upper third of the main bile duct and common                            hepatic duct were moderately dilated, secondary to                            a stricture.                           - One stent was removed from the biliary tree.                           -  Cells for cytology obtained in the upper third of                            the main bile duct.                           - One temporary stent was placed into the common                            bile duct.  Recommendation:           - Avoid aspirin and nonsteroidal anti-inflammatory                            medicines for 3 days.                           - Watch for pancreatitis, bleeding, perforation,                            and cholangitis.                           - Observe patient's clinical course.                           - Continue antibiotics.                           - May need paracentesis, pending clinical course.                           - May need EUS/cholangioscopy, [place]the[place]road,                             pending clinical course.                           - Alcohol cessation.                           - Clear liquid diet today.                           [name] will follow.   PARACENTESIS (7/23, 7/26, 8/2)  Antimicrobials:  Zosyn IV  Ceftriaxone IV  Metronidazole IV   Subjective: Interpreter: Barry Brunner [phone]  Abdominal pain is improving slowly but still present. He is having bowel movements.  Objective: Vitals:   03/08/20 0440 03/08/20 1335 03/08/20 2033 03/09/20 0613  BP: 124/80 127/84 126/82 (!) 133/91  Pulse: 85 82 86 81  Resp: 17 18 16 17   Temp: 99.1 F (37.3 C) 98.6 F (37 C) 98.9 F (37.2 C) 98.2 F (36.8 C)  TempSrc: Oral Oral Oral Oral  SpO2: 96% 98% 96% 97%  Weight:      Height:        Intake/Output Summary (Last 24 hours) at 03/09/2020 1511 Last data filed at 03/09/2020 0900 Gross per 24 hour  Intake 1040 ml  Output 1540 ml  Net -500 ml   Filed Weights   02/27/20 0351 03/03/20 0439  Weight: (!) 97 kg 91.8 kg    Examination:  General exam: Appears calm and comfortable Respiratory system: Clear to auscultation. Respiratory effort normal. Cardiovascular system: S1 & S2 heard, RRR. No murmurs, rubs, gallops or clicks. Gastrointestinal system: Abdomen is distended, soft and tender in RUQ/RLQs. Normal bowel sounds heard. Central nervous system: Alert and oriented. No focal neurological deficits. Musculoskeletal: No calf tenderness Skin: No cyanosis. No rashes Psychiatry: Judgement and insight appear normal. Mood & affect appropriate.      Data Reviewed: I have personally reviewed following labs and imaging studies  CBC Lab Results  Component Value Date   WBC 9.8 03/09/2020   RBC 4.23 03/09/2020   HGB 13.8 03/09/2020   HCT 40.8 03/09/2020   MCV 96.5 03/09/2020   MCH 32.6 03/09/2020   PLT 362 03/09/2020   MCHC 33.8 03/09/2020   RDW 14.2 03/09/2020   LYMPHSABS 2.3 03/02/2020   MONOABS 1.1 (H) 03/02/2020   EOSABS 0.1  03/02/2020   BASOSABS 0.1 90/30/0923     Last metabolic panel Lab Results  Component Value Date   NA 132 (L) 03/09/2020   K 4.3 03/09/2020   CL 98 03/09/2020   CO2 24 03/09/2020   BUN 10 03/09/2020   CREATININE 0.84 03/09/2020   GLUCOSE 110 (H) 03/09/2020   GFRNONAA >60 03/09/2020   GFRAA >60 03/09/2020   CALCIUM 8.5 (L) 03/09/2020   PHOS 3.3 03/07/2020   PROT 7.0 03/07/2020   ALBUMIN 2.5 (L) 03/07/2020   BILITOT 2.2 (H) 03/07/2020   ALKPHOS 200 (H) 03/07/2020   AST 46 (H) 03/07/2020   ALT 25 03/07/2020   ANIONGAP 10 03/09/2020    CBG (last 3)  No results for input(s): GLUCAP in the last 72 hours.   GFR: Estimated Creatinine Clearance: 105.9 mL/min (by C-G formula based on SCr of 0.84 mg/dL).  Coagulation Profile: Recent Labs  Lab 03/04/20 0854  INR 1.1    Recent Results (from the past 240 hour(s))  Gram stain     Status: None   Collection Time: 03/06/20 10:05 AM   Specimen: Abdomen; Peritoneal Fluid  Result Value Ref Range Status   Specimen Description PERITONEAL FLUID  Final   Special Requests ABDOMEN  Final   Gram Stain   Final    CYTOSPIN SMEAR WBC PRESENT,BOTH PMN AND MONONUCLEAR NO ORGANISMS SEEN Performed at [facility], [address] [place], [place] [address]    Report Status 03/08/2020 FINAL  Final  Culture, body fluid-bottle     Status: None (Preliminary result)   Collection Time: 03/06/20 10:05 AM   Specimen: Peritoneal Washings  Result Value Ref Range Status   Specimen Description PERITONEAL FLUID  Final   Special Requests ABDOMEN  Final   Culture   Final    NO GROWTH 3 DAYS Performed at [facility] [address], [place], [place] [address]    Report Status PENDING  Incomplete        Radiology Studies: CT ABDOMEN W CONTRAST  Result Date: 03/08/2020 CLINICAL DATA:  Acute abdominal pain, nonlocalized. EXAM: CT ABDOMEN WITH CONTRAST TECHNIQUE: Multidetector CT imaging of the abdomen was performed using the standard  protocol following bolus administration of intravenous contrast. CONTRAST:  164mL OMNIPAQUE IOHEXOL 300 MG/ML  SOLN COMPARISON:  February 28 2020 FINDINGS: Lower chest: Basilar atelectasis.  No effusion.  No consolidation. Hepatobiliary: Liver with lobular and nodular contours. Fissural widening and  mild caudate enlargement. Portal vein is patent. Hepatic veins are patent. Post cholecystectomy. Biliary stent remains in place. Small amount of pneumobilia. Not changed since previous study of February 28, 2020 Small areas of low attenuation in the RIGHT hepatic lobe are less conspicuous than on the previous exam. Largest seen on image 54 of series 3 measuring 9 mm. Stranding suggested along the anterior and lateral RIGHT abdomen in the omentum and in the pericolonic fat with very similar appearance to previous imaging studies. Associated with peritoneal fluid that is diminished in volume when compared to the prior exam following paracentesis. Pancreas: Pancreas is normal without ductal dilation, inflammation or focal lesion. Spleen: Mildly enlarged. Adrenals/Urinary Tract: Adrenal glands are normal. Kidneys enhance symmetrically.  No sign of hydronephrosis. Stomach/Bowel: Stool like material within small bowel loops of the RIGHT hemiabdomen. Appendix is normal. Inflammation along the RIGHT colon in the RIGHT paracolic gutter and RIGHT hemiabdomen similar to the prior study. Scattered areas of interloop fluid are diminished as well since the prior exam. Vascular/Lymphatic: Vascular structures in the abdomen are patent. No sign of adenopathy in the upper abdomen or in the retroperitoneum. Other: Abdominal fluid and omental stranding as described. Musculoskeletal: Spinal degenerative changes. No acute or destructive bone process. IMPRESSION: 1. Small areas of low attenuation in the RIGHT hepatic lobe are less conspicuous than on the previous exam. Largest seen on image 54 of series 3 measuring 9 mm. This may represent improvement  with respect to small hepatic abscesses are areas of focal inflammation. Correlate with clinical evidence of cholangitis. 2. Fat stranding along the RIGHT colon in the RIGHT paracolic gutter and RIGHT hemiabdomen similar to the prior study. Findings are of uncertain significance in the setting of multiple studies without clear evidence of biliary leak. Findings could also be related to underlying liver disease and ascites but this stranding raises the question of underlying peritoneal or omental inflammation though remains of uncertain significance in the absence of peritoneal enhancement and prior imaging evaluations. Correlation with results of paracentesis is suggested. 3. Biliary stent remains in place. Small amount of pneumobilia. Not changed since previous study. 4. Mild splenomegaly. 5. Stool like material within small bowel loops of the RIGHT hemiabdomen. This could be related to ileus in the setting of inflammation. 6. Aortic atherosclerosis. Aortic Atherosclerosis (ICD10-I70.0). Electronically Signed   By: Zetta Bills M.D.   On: 03/08/2020 17:40        Scheduled Meds: . amoxicillin-clavulanate  1 tablet Oral Q12H  . enoxaparin (LOVENOX) injection  50 mg Subcutaneous Q24H  . folic acid  1 mg Oral Daily  . furosemide  40 mg Oral Daily  . multivitamin with minerals  1 tablet Oral Daily  . pantoprazole  40 mg Oral Daily  . sodium chloride flush  3 mL Intravenous Q12H  . spironolactone  50 mg Oral Daily  . sulfamethoxazole-trimethoprim  1 tablet Oral Q12H  . thiamine  100 mg Oral Daily   Continuous Infusions: . sodium chloride Stopped (03/06/20 0419)  . albumin human       LOS: 14 days     Cordelia Poche, MD [facility] 03/09/2020, 3:11 PM  If 7PM-7AM, please contact night-coverage [email or web address]

## 2020-03-09 NOTE — Progress Notes (Signed)
[facility] for Infectious Disease  Date of Admission:  02/23/2020     Total days of antibiotics 15         ASSESSMENT:  Mr. Pantaleo CT abdomen shows small liver abscess that appear improved compared to previous imaging. It would be prudent to continue with current antibiotic therapy for hepatic abscesses. Peritoneal fluid cultures are without growth and previously bacteremic with E. Coli, K. Pneumoniae and P. Penneri. Will plan for 1 month of treatment with Bactrim which will cover these organisms in addition to Augmentin which will provide additional coverage including enterococcus. Change antibiotics to oral in plan for discharge. Transition of Care Pharmacy to bring medications to bedside. Will need to continue with cirrhosis care with gastroenterology. Follow up in ID clinic in 4 weeks with plans for repeat imaging.   PLAN:  1. Change antibiotics to Bactrim and Augmentin 2. Transition of Care Pharmacy to arrange for beside delivery. 3. Continue cirrhosis management per gastroenterology.  4. Arrange follow up in ID clinic in 4 weeks.   Principal Problem:   Sepsis following intra-abdominal surgery ([place]) Active Problems:   Hyperbilirubinemia   Essential hypertension   Alcohol dependence with uncomplicated withdrawal ([facility])    amoxicillin-clavulanate  1 tablet Oral Q12H   enoxaparin (LOVENOX) injection  50 mg Subcutaneous C37S   folic acid  1 mg Oral Daily   furosemide  40 mg Oral Daily   multivitamin with minerals  1 tablet Oral Daily   pantoprazole  40 mg Oral Daily   sodium chloride flush  3 mL Intravenous Q12H   spironolactone  50 mg Oral Daily   sulfamethoxazole-trimethoprim  1 tablet Oral Q12H   thiamine  100 mg Oral Daily    SUBJECTIVE:  Afebrile over the past 36 hours with no leukocytosis. Continues to have right sided abdominal pain and wants to know when he will be discharged.   Mr. Ewbank primary preferred language is Spanish and  an interpreter from [employer] is present via phone to aid in communication.   Allergies  Allergies  Allergen Reactions   Pork-Derived Products Other (See Comments)    "I have pain in my stomach if I eat this"     Review of Systems: Review of Systems  Constitutional: Negative for chills, fever and weight loss.  Respiratory: Negative for cough, shortness of breath and wheezing.   Cardiovascular: Negative for chest pain and leg swelling.  Gastrointestinal: Positive for abdominal pain. Negative for constipation, diarrhea, nausea and vomiting.  Skin: Negative for rash.      OBJECTIVE: Vitals:   03/08/20 0440 03/08/20 1335 03/08/20 2033 03/09/20 0613  BP: 124/80 127/84 126/82 (!) 133/91  Pulse: 85 82 86 81  Resp: 17 18 16 17   Temp: 99.1 F (37.3 C) 98.6 F (37 C) 98.9 F (37.2 C) 98.2 F (36.8 C)  TempSrc: Oral Oral Oral Oral  SpO2: 96% 98% 96% 97%  Weight:      Height:       Body mass index is 29.89 kg/m.  Physical Exam Constitutional:      General: He is not in acute distress.    Appearance: He is well-developed.     Comments: Lying in bed with head of bed slightly elevated. Pleasant.   Cardiovascular:     Rate and Rhythm: Normal rate and regular rhythm.     Heart sounds: Normal heart sounds.  Pulmonary:     Effort: Pulmonary effort is normal.     Breath sounds: Normal  breath sounds.  Abdominal:     Tenderness: There is abdominal tenderness in the right upper quadrant.  Skin:    General: Skin is warm and dry.  Neurological:     Mental Status: He is alert and oriented to person, place, and time.  Psychiatric:        Behavior: Behavior normal.        Thought Content: Thought content normal.        Judgment: Judgment normal.     Lab Results Lab Results  Component Value Date   WBC 9.8 03/09/2020   HGB 13.8 03/09/2020   HCT 40.8 03/09/2020   MCV 96.5 03/09/2020   PLT 362 03/09/2020    Lab Results  Component Value Date   CREATININE 0.84  03/09/2020   BUN 10 03/09/2020   NA 132 (L) 03/09/2020   K 4.3 03/09/2020   CL 98 03/09/2020   CO2 24 03/09/2020    Lab Results  Component Value Date   ALT 25 03/07/2020   AST 46 (H) 03/07/2020   GGT 2,189 (H) 04/10/2019   ALKPHOS 200 (H) 03/07/2020   BILITOT 2.2 (H) 03/07/2020     Microbiology: Recent Results (from the past 240 hour(s))  Gram stain     Status: None   Collection Time: 03/06/20 10:05 AM   Specimen: Abdomen; Peritoneal Fluid  Result Value Ref Range Status   Specimen Description PERITONEAL FLUID  Final   Special Requests ABDOMEN  Final   Gram Stain   Final    CYTOSPIN SMEAR WBC PRESENT,BOTH PMN AND MONONUCLEAR NO ORGANISMS SEEN Performed at [facility], [place] [address] [place], [place] [address]    Report Status 03/08/2020 FINAL  Final  Culture, body fluid-bottle     Status: None (Preliminary result)   Collection Time: 03/06/20 10:05 AM   Specimen: Peritoneal Washings  Result Value Ref Range Status   Specimen Description PERITONEAL FLUID  Final   Special Requests ABDOMEN  Final   Culture   Final    NO GROWTH 2 DAYS Performed at [facility] [address], [place], [place] [address]    Report Status PENDING  Incomplete     Terri Piedra, NP [facility]  03/09/2020  12:25 PM

## 2020-03-09 NOTE — TOC Initial Note (Signed)
Transition of Care [facility]) - Initial/Assessment Note    Patient Details  Name: Micheal Watson MRN: [id number] Date of Birth: 11-03-1960  Transition of Care [facility]) CM/SW Contact:    Marilu Favre, RN Phone Number: 03/09/2020, 12:14 PM  Clinical Narrative:                  Patient from home.   Plan discharge 03/10/20, patient may have transportation with a friend.   Reinstated in [facility] program with no co pays. [facility] has medications.   Scheduled follow up appointment at [facility] on April 07, 2020 at 2:30 pm. Placed on AVS.   Patient can use pharmacy at [facility] in future.  Expected Discharge Plan: Home/Self Care Barriers to Discharge: Continued Medical Work up   Patient Goals and CMS Choice Patient states their goals for this hospitalization and ongoing recovery are:: to return to home CMS Medicare.gov Compare Post Acute Care list provided to:: Patient    Expected Discharge Plan and Services Expected Discharge Plan: Home/Self Care   Discharge Planning Services: CM Consult, [facility], [facility], Medication Assistance   Living arrangements for the past 2 months: Single Family Home                 DME Arranged: N/A         HH Arranged: NA          Prior Living Arrangements/Services Living arrangements for the past 2 months: Single Family Home Lives with::  (boss) Patient language and need for interpreter reviewed:: Yes Do you feel safe going back to the place where you live?: Yes      Need for Family Participation in Patient Care: No (Comment) Care giver support system in place?: No (comment)   Criminal Activity/Legal Involvement Pertinent to Current Situation/Hospitalization: No - Comment as needed  Activities of Daily Living Home Assistive Devices/Equipment: None ADL Screening (condition at time of admission) Patient's cognitive ability adequate to safely complete daily activities?:  Yes Is the patient deaf or have difficulty hearing?: No Does the patient have difficulty seeing, even when wearing glasses/contacts?: Yes Does the patient have difficulty concentrating, remembering, or making decisions?: No Patient able to express need for assistance with ADLs?: Yes Does the patient have difficulty dressing or bathing?: No Independently performs ADLs?: Yes (appropriate for developmental age) Does the patient have difficulty walking or climbing stairs?: No Weakness of Legs: None Weakness of Arms/Hands: None  Permission Sought/Granted   Permission granted to share information with : No              Emotional Assessment       Orientation: : Oriented to Self, Oriented to Place, Oriented to  Time, Oriented to Situation Alcohol / Substance Use: Not Applicable Psych Involvement: No (comment)  Admission diagnosis:  Hyperbilirubinemia [E80.6] Jaundice [R17] Intra-abdominal abscess ([place]) [K65.1] Ascites due to alcoholic cirrhosis ([place]) [W43.15] Patient Active Problem List   Diagnosis Date Noted  . Sepsis following intra-abdominal surgery ([place]) 02/24/2020  . Alcohol dependence with uncomplicated withdrawal ([place]) 02/24/2020  . Abdominal pain 12/24/2019  . Essential hypertension 12/15/2019  . Bile leak, postoperative 11/28/2019  . Acute cholecystitis without calculus 11/21/2019  . Protein-calorie malnutrition ([place]) 04/11/2019  . Hyperammonemia ([place]) 04/09/2019  . Elevated LFTs   . Gynecomastia, male   . Hyperbilirubinemia   . Tremor of unknown origin   . COVID-19 virus infection 11/27/2018  . Acute respiratory disease due to COVID-19 virus 11/27/2018  .  SIRS (systemic inflammatory response syndrome) ([place]) 11/25/2018  . Acute on chronic respiratory failure with hypoxia ([place]) 11/21/2018  . Suspected COVID-19 virus infection 11/17/2018   PCP:  Patient, No Pcp Per Pharmacy:   [facility] [place], [place] [address] Phone: [phone] Fax: [phone]     Social Determinants of Health (SDOH) Interventions    Readmission Risk Interventions Readmission Risk Prevention Plan 12/09/2019  Post Dischage Appt Complete  Medication Screening Complete  Transportation Screening Complete

## 2020-03-09 NOTE — Progress Notes (Signed)
Appreciate guidance of ID team.  I have no plans for EUS/ERCP until his liver abscesses have improved and there is documented resolution of his primary/secondary biliary peritonitis.  I do not think there is appreciate ongoing biliary obstruction as cause of liver abscesses (but prior clogged stent removed this admission cause have led to such), as he has new stent, pneumobilia on CT scan, and marked improved LFTs.  [facility] will follow along.

## 2020-03-10 ENCOUNTER — Inpatient Hospital Stay (HOSPITAL_COMMUNITY): Payer: Self-pay

## 2020-03-10 HISTORY — PX: IR PARACENTESIS: IMG2679

## 2020-03-10 LAB — CBC
HCT: 40.8 % (ref 39.0–52.0)
Hemoglobin: 13.7 g/dL (ref 13.0–17.0)
MCH: 32.2 pg (ref 26.0–34.0)
MCHC: 33.6 g/dL (ref 30.0–36.0)
MCV: 96 fL (ref 80.0–100.0)
Platelets: 366 10*3/uL (ref 150–400)
RBC: 4.25 MIL/uL (ref 4.22–5.81)
RDW: 14 % (ref 11.5–15.5)
WBC: 11.4 10*3/uL — ABNORMAL HIGH (ref 4.0–10.5)
nRBC: 0 % (ref 0.0–0.2)

## 2020-03-10 LAB — BODY FLUID CELL COUNT WITH DIFFERENTIAL
Lymphs, Fluid: 3 %
Monocyte-Macrophage-Serous Fluid: 41 % — ABNORMAL LOW (ref 50–90)
Neutrophil Count, Fluid: 55 % — ABNORMAL HIGH (ref 0–25)
Other Cells, Fluid: 1 %
Total Nucleated Cell Count, Fluid: 2725 cu mm — ABNORMAL HIGH (ref 0–1000)

## 2020-03-10 LAB — BASIC METABOLIC PANEL
Anion gap: 11 (ref 5–15)
BUN: 6 mg/dL (ref 6–20)
CO2: 22 mmol/L (ref 22–32)
Calcium: 8.6 mg/dL — ABNORMAL LOW (ref 8.9–10.3)
Chloride: 96 mmol/L — ABNORMAL LOW (ref 98–111)
Creatinine, Ser: 0.8 mg/dL (ref 0.61–1.24)
GFR calc Af Amer: >60 mL/min (ref >60)
GFR calc non Af Amer: >60 mL/min (ref >60)
Glucose, Bld: 113 mg/dL — ABNORMAL HIGH (ref 70–99)
Potassium: 4.4 mmol/L (ref 3.5–5.1)
Sodium: 129 mmol/L — ABNORMAL LOW (ref 135–145)

## 2020-03-10 MED ORDER — SODIUM CHLORIDE 0.9 % IV SOLN
1.0000 g | Freq: Three times a day (TID) | INTRAVENOUS | Status: AC
  Administered 2020-03-10 – 2020-03-17 (×21): 1 g via INTRAVENOUS
  Filled 2020-03-10 (×22): qty 1

## 2020-03-10 MED ORDER — ENOXAPARIN SODIUM 60 MG/0.6ML ~~LOC~~ SOLN
45.0000 mg | SUBCUTANEOUS | Status: DC
  Administered 2020-03-11 – 2020-03-13 (×3): 45 mg via SUBCUTANEOUS
  Filled 2020-03-10 (×3): qty 0.6

## 2020-03-10 MED ORDER — LIDOCAINE HCL 1 % IJ SOLN
INTRAMUSCULAR | Status: AC
  Filled 2020-03-10: qty 20

## 2020-03-10 MED ORDER — GERHARDT'S BUTT CREAM
TOPICAL_CREAM | Freq: Three times a day (TID) | CUTANEOUS | Status: DC
  Filled 2020-03-10: qty 1

## 2020-03-10 NOTE — Procedures (Signed)
PROCEDURE SUMMARY:  Successful US guided paracentesis from RLQ.  Yielded 600 mL of hazy amber fluid.  No immediate complications.  Pt tolerated well.   Specimen was sent for labs.  EBL < 38mL  Ascencion Dike PA-C 03/10/2020 12:44 PM

## 2020-03-10 NOTE — Progress Notes (Signed)
Pharmacy Antibiotic Note  Micheal Watson is a 59 y.o. male recent cholecystitis/cholangitis status post CBD stent and JP drain, admitted on 02/23/2020 with sepsis/bacteremia 2/2 spontaneous bacterial peritonitis. CT showing several persistent hepatic abscesses. Pharmacy has been consulted for meropenem dosing.  Today, patient underwent repeat paracentesis w 600 ml fluid removed. WBC trending up, some persistent low-grade fevers, Tmax 99.3.   Plan: Meropenem 1g IV q8h\Monitor clinical picture, renal function F/U C&S, abx de-escalation, LOT (likely will need longer course for liver abscesses)   Height: 5\' 9"  (175.3 cm) Weight: 91.8 kg (202 lb 6.1 oz) IBW/kg (Calculated) : 70.7  Temp (24hrs), Avg:98.7 F (37.1 C), Min:98.3 F (36.8 C), Max:99.3 F (37.4 C)  Recent Labs  Lab 03/04/20 0854 03/05/20 0326 03/07/20 0622 03/09/20 0708 03/10/20 0823  WBC  --   --   --  9.8 11.4*  CREATININE 0.88 0.92 0.81 0.84 0.80    Estimated Creatinine Clearance: 111.2 mL/min (by C-G formula based on SCr of 0.8 mg/dL).    Allergies  Allergen Reactions  . Pork-Derived Products Other (See Comments)    "I have pain in my stomach if I eat this"    Antimicrobials this admission: CTX 7/22 >> 8/5 Flagyl 7/23 >>8/5 Augmentin 8/5>>8/6 Bactrim PO 8/5>>8/6 Meropenem 8/6>  Microbiology results: 7/22 BCx: proteus penneri, e.coli, klebsiella. All sens to ceftriaxone 7/23 peritoneal cx: NG, PMG <250, prot >1 8/2 peritoneal cx: NGTD; PMN <250   Brendolyn Patty, PharmD Clinical Pharmacist  03/10/2020   3:40 PM   Please check AMION for all [facility] phone numbers After 10:00 PM, call the [facility] [phone]

## 2020-03-10 NOTE — Progress Notes (Signed)
PROGRESS NOTE    Micheal Watson  RWE:[id number] DOB: 1961/04/24 DOA: 02/23/2020 PCP: Patient, No Pcp Per   Brief Narrative: Micheal Watson is a 59 y.o. male with a history of ethanol abuse, recent cholecystitis/cholangitis status post CBD stent and JP drain placement.  Patient presented secondary to worsening abdominal pain, nausea, fever.  Patient found to have evidence of sepsis on admission with concern for SBP and found to have associated bacteremia and hepatic abscess.  Also noted to have biliary stricture which was managed by GI with placement of the stent.  Currently on IV antibiotics for the treatment of SBP and bacteremia with concern of possible worsening liver abscess.   Assessment & Plan:   Principal Problem:   Sepsis following intra-abdominal surgery ([place]) Active Problems:   Hyperbilirubinemia   Essential hypertension   Alcohol dependence with uncomplicated withdrawal ([place])   Sepsis Present on admission.  Concern for possible SBP confirmed by cell count from paracentesis.  Patient was initially managed on Zosyn which was transitioned to ceftriaxone and Flagyl IV. Patient is not on Bactrim and Augmentin.  SBP In setting of recent abdominal surgery with associated obstructed CBD stent.  Patient underwent multiple paracenteses with fluid analysis consistent with SBP.  Previous body fluid cultures appear to have been canceled.  Only most recent fluid culture from 8/2 is resulting in no growth to date.  ERCP performed on 7/22 by GI with stent removal and replacement.  Patient has been on ceftriaxone and Flagyl IV for treatment of SBP.  CT scan on 8/4 significant for changes consistent with peritonitis. Patient with down trended white blood cell count. -Infectious disease recommendations: Bactrim/Augmentin with plan for 30 day treatment -Paracentesis today with cell count  Hepatic abscess Seen on recent CT scan from 7/26.  Patient still has discontinued right upper  quadrant abdominal pain and spiked spiked fever 100.4 F overnight.  Leukocytosis is persistent but mild.  CT abdomen shows improving abscesses.  Biliary stricture Patient underwent ERCP as mentioned above.  Stent placement mentioned above.  Hyponatremia Secondary to alcohol use.  Mostly mild. In setting of hepatic disease. Asymptomatic  Polymicrobial bacteremia In setting of SBP.  Blood cultures significant for Proteus penneri, E. coli, Klebsiella pneumonia.  Patient is on antibiotics as mentioned above.  Alcohol abuse No current withdrawal symptoms.   DVT prophylaxis: Lovenox Code Status:   Code Status: Full Code Family Communication: None at bedside Disposition Plan: Discharge home in several days pending significant improvement of SBP as there is a concern for high morbidity if patient discharges and attempts to manage as an outpatient.   Consultants:   Gastroenterology  Infectious disease  Interventional radiology  Procedures:   ERCP (7/22) Impression:               - The upper third of the main bile duct and common                            hepatic duct were moderately dilated, secondary to                            a stricture.                           - One stent was removed from the biliary tree.                           -  Cells for cytology obtained in the upper third of                            the main bile duct.                           - One temporary stent was placed into the common                            bile duct.  Recommendation:           - Avoid aspirin and nonsteroidal anti-inflammatory                            medicines for 3 days.                           - Watch for pancreatitis, bleeding, perforation,                            and cholangitis.                           - Observe patient's clinical course.                           - Continue antibiotics.                           - May need paracentesis, pending clinical course.                            - May need EUS/cholangioscopy, [place]the[place]road,                            pending clinical course.                           - Alcohol cessation.                           - Clear liquid diet today.                           [facility] will follow.   PARACENTESIS (7/23, 7/26, 8/2)  Antimicrobials:  Zosyn IV  Ceftriaxone IV  Metronidazole IV  Bactrim PO  Augmentin PO   Subjective: Interpreter: Constance Holster #[id number]  Abdominal pain is improved. Had some rectal pain with a bowel movement.  Objective: Vitals:   03/09/20 0613 03/09/20 1752 03/09/20 2211 03/10/20 0421  BP: (!) 133/91 130/89 129/82 118/84  Pulse: 81 85 86 91  Resp: 17 18 16    Temp: 98.2 F (36.8 C) 98.6 F (37 C) 98.3 F (36.8 C) 99.3 F (37.4 C)  TempSrc: Oral  Oral Oral  SpO2: 97% 98% 95% 97%  Weight:      Height:        Intake/Output Summary (Last 24 hours) at 03/10/2020 1142 Last data filed at 03/10/2020 0400 Gross per 24 hour  Intake 1320  ml  Output 600 ml  Net 720 ml   Filed Weights   02/27/20 0351 03/03/20 0439  Weight: (!) 97 kg 91.8 kg    Examination:  General exam: Appears calm and comfortable Respiratory system: Clear to auscultation. Respiratory effort normal. Cardiovascular system: S1 & S2 heard, RRR. No murmurs, rubs, gallops or clicks. Gastrointestinal system: Abdomen is distended, soft and nontender. No organomegaly or masses felt. Normal bowel sounds heard. Central nervous system: Alert and oriented. No focal neurological deficits. Musculoskeletal: No calf tenderness Skin: No cyanosis. No rashes Psychiatry: Judgement and insight appear normal. Mood & affect appropriate.       Data Reviewed: I have personally reviewed following labs and imaging studies  CBC Lab Results  Component Value Date   WBC 11.4 (H) 03/10/2020   RBC 4.25 03/10/2020   HGB 13.7 03/10/2020   HCT 40.8 03/10/2020   MCV 96.0 03/10/2020   MCH 32.2 03/10/2020   PLT 366 03/10/2020    MCHC 33.6 03/10/2020   RDW 14.0 03/10/2020   LYMPHSABS 2.3 03/02/2020   MONOABS 1.1 (H) 03/02/2020   EOSABS 0.1 03/02/2020   BASOSABS 0.1 28/31/5176     Last metabolic panel Lab Results  Component Value Date   NA 129 (L) 03/10/2020   K 4.4 03/10/2020   CL 96 (L) 03/10/2020   CO2 22 03/10/2020   BUN 6 03/10/2020   CREATININE 0.80 03/10/2020   GLUCOSE 113 (H) 03/10/2020   GFRNONAA >60 03/10/2020   GFRAA >60 03/10/2020   CALCIUM 8.6 (L) 03/10/2020   PHOS 3.3 03/07/2020   PROT 7.0 03/07/2020   ALBUMIN 2.5 (L) 03/07/2020   BILITOT 2.2 (H) 03/07/2020   ALKPHOS 200 (H) 03/07/2020   AST 46 (H) 03/07/2020   ALT 25 03/07/2020   ANIONGAP 11 03/10/2020    CBG (last 3)  No results for input(s): GLUCAP in the last 72 hours.   GFR: Estimated Creatinine Clearance: 111.2 mL/min (by C-G formula based on SCr of 0.8 mg/dL).  Coagulation Profile: Recent Labs  Lab 03/04/20 0854  INR 1.1    Recent Results (from the past 240 hour(s))  Gram stain     Status: None   Collection Time: 03/06/20 10:05 AM   Specimen: Abdomen; Peritoneal Fluid  Result Value Ref Range Status   Specimen Description PERITONEAL FLUID  Final   Special Requests ABDOMEN  Final   Gram Stain   Final    CYTOSPIN SMEAR WBC PRESENT,BOTH PMN AND MONONUCLEAR NO ORGANISMS SEEN Performed at [facility], [address], [place], [place] [address]    Report Status 03/08/2020 FINAL  Final  Culture, body fluid-bottle     Status: None (Preliminary result)   Collection Time: 03/06/20 10:05 AM   Specimen: Peritoneal Washings  Result Value Ref Range Status   Specimen Description PERITONEAL FLUID  Final   Special Requests ABDOMEN  Final   Culture   Final    NO GROWTH 3 DAYS Performed at [facility] [address], [place], [place] [address]    Report Status PENDING  Incomplete        Radiology Studies: CT ABDOMEN W CONTRAST  Result Date: 03/08/2020 CLINICAL DATA:  Acute abdominal pain,  nonlocalized. EXAM: CT ABDOMEN WITH CONTRAST TECHNIQUE: Multidetector CT imaging of the abdomen was performed using the standard protocol following bolus administration of intravenous contrast. CONTRAST:  113mL OMNIPAQUE IOHEXOL 300 MG/ML  SOLN COMPARISON:  February 28 2020 FINDINGS: Lower chest: Basilar atelectasis.  No effusion.  No consolidation. Hepatobiliary: Liver with lobular and nodular contours. Fissural widening and mild caudate enlargement. Portal vein is patent. Hepatic veins are patent. Post cholecystectomy. Biliary stent remains in place. Small amount of pneumobilia. Not changed since previous study of February 28, 2020 Small areas of low attenuation in the RIGHT hepatic lobe are less conspicuous than on the previous exam. Largest seen on image 54 of series 3 measuring 9 mm. Stranding suggested along the anterior and lateral RIGHT abdomen in the omentum and in the pericolonic fat with very similar appearance to previous imaging studies. Associated with peritoneal fluid that is diminished in volume when compared to the prior exam following paracentesis. Pancreas: Pancreas is normal without ductal dilation, inflammation or focal lesion. Spleen: Mildly enlarged. Adrenals/Urinary Tract: Adrenal glands are normal. Kidneys enhance symmetrically.  No sign of hydronephrosis. Stomach/Bowel: Stool like material within small bowel loops of the RIGHT hemiabdomen. Appendix is normal. Inflammation along the RIGHT colon in the RIGHT paracolic gutter and RIGHT hemiabdomen similar to the prior study. Scattered areas of interloop fluid are diminished as well since the prior exam. Vascular/Lymphatic: Vascular structures in the abdomen are patent. No sign of adenopathy in the upper abdomen or in the retroperitoneum. Other: Abdominal fluid and omental stranding as described. Musculoskeletal: Spinal degenerative changes. No acute or destructive bone process. IMPRESSION: 1. Small areas of low attenuation in the RIGHT hepatic lobe  are less conspicuous than on the previous exam. Largest seen on image 54 of series 3 measuring 9 mm. This may represent improvement with respect to small hepatic abscesses are areas of focal inflammation. Correlate with clinical evidence of cholangitis. 2. Fat stranding along the RIGHT colon in the RIGHT paracolic gutter and RIGHT hemiabdomen similar to the prior study. Findings are of uncertain significance in the setting of multiple studies without clear evidence of biliary leak. Findings could also be related to underlying liver disease and ascites but this stranding raises the question of underlying peritoneal or omental inflammation though remains of uncertain significance in the absence of peritoneal enhancement and prior imaging evaluations. Correlation with results of paracentesis is suggested. 3. Biliary stent remains in place. Small amount of pneumobilia. Not changed since previous study. 4. Mild splenomegaly. 5. Stool like material within small bowel loops of the RIGHT hemiabdomen. This could be related to ileus in the setting of inflammation. 6. Aortic atherosclerosis. Aortic Atherosclerosis (ICD10-I70.0). Electronically Signed   By: Zetta Bills M.D.   On: 03/08/2020 17:40        Scheduled Meds: . amoxicillin-clavulanate  1 tablet Oral Q12H  . enoxaparin (LOVENOX) injection  50 mg Subcutaneous Q24H  . folic acid  1 mg Oral Daily  . furosemide  40 mg Oral Daily  . multivitamin with minerals  1 tablet Oral Daily  . pantoprazole  40 mg Oral Daily  . sodium chloride flush  3 mL Intravenous Q12H  . spironolactone  50 mg Oral Daily  . sulfamethoxazole-trimethoprim  1 tablet Oral Q12H  . thiamine  100 mg Oral Daily   Continuous Infusions: . sodium chloride Stopped (03/06/20 0419)  . albumin human       LOS: 15 days     Cordelia Poche, MD [facility] 03/10/2020, 11:42 AM  If 7PM-7AM, please contact night-coverage [email or web address]

## 2020-03-10 NOTE — Progress Notes (Signed)
[facility] Progress Note  Micheal Watson 59 y.o. 01/20/1961  CC:  Cirrhosis, SBP, CBD stricture  Subjective: Patient denies any abdominal pain, nausea, or vomiting.  Reports rectal irritation due to diarrhea.  [employer] video interpreter services utilized for this encounter Constance Holster [phone])  ROS : Review of Systems  Cardiovascular: Negative for chest pain and palpitations.  Gastrointestinal: Positive for diarrhea. Negative for abdominal pain, blood in stool, constipation, heartburn, melena, nausea and vomiting.   Objective: Vital signs in last 24 hours: Vitals:   03/09/20 2211 03/10/20 0421  BP: 129/82 118/84  Pulse: 86 91  Resp: 16   Temp: 98.3 F (36.8 C) 99.3 F (37.4 C)  SpO2: 95% 97%    Physical Exam:  General:  Alert, cooperative, no distress, appears stated age  Head:  Normocephalic, without obvious abnormality, atraumatic  Eyes:  Scleral icterus, EOMs intact  Lungs:   Clear to auscultation bilaterally, respirations unlabored  Heart:  Regular rate and rhythm, S1, S2 normal  Abdomen:   Distended but soft, non-tender. Bowel sounds active all four quadrants,  no guarding or peritoneal signs.  Extremities: Extremities normal, atraumatic, no  edema  Pulses: 2+ and symmetric    Lab Results: Recent Labs    03/09/20 0708 03/10/20 0823  NA 132* 129*  K 4.3 4.4  CL 98 96*  CO2 24 22  GLUCOSE 110* 113*  BUN 10 6  CREATININE 0.84 0.80  CALCIUM 8.5* 8.6*   No results for input(s): AST, ALT, ALKPHOS, BILITOT, PROT, ALBUMIN in the last 72 hours. Recent Labs    03/09/20 0708 03/10/20 0823  WBC 9.8 11.4*  HGB 13.8 13.7  HCT 40.8 40.8  MCV 96.5 96.0  PLT 362 366   No results for input(s): LABPROT, INR in the last 72 hours.   Assessment: Cirrhosis with recent SBP. WBCs 11.4 today, increased from 9.8 yesterday.  LFTs as of 8/3: T. Bili 2.2/ AST 46/ ALT 25/ ALP 200  CBD stricture as seen on ERCP 7/22.  Prior stent removed with temporary stent placed  7/22.  Cytology of bile duct brushing showed atypical cells but not diagnostic of malignancy.  Bile duct stent: No malignant cells identified, fungal organisms present   Plan: Repeat paracentesis to assess for resolution of SBP.  Recheck LFTs tomorrow.  Due to noncompliance, recommend inpatient EUS/ERCP once SBP is resolved.  Pending paracentesis, possible EUS/ERCP next week.  [facility] will follow.  Salley Slaughter PA-C 03/10/2020, 12:15 PM  Contact #  [phone]

## 2020-03-11 LAB — HEPATIC FUNCTION PANEL
ALT: 20 U/L (ref 0–44)
AST: 30 U/L (ref 15–41)
Albumin: 2.6 g/dL — ABNORMAL LOW (ref 3.5–5.0)
Alkaline Phosphatase: 139 U/L — ABNORMAL HIGH (ref 38–126)
Bilirubin, Direct: 0.9 mg/dL — ABNORMAL HIGH (ref 0.0–0.2)
Indirect Bilirubin: 1.4 mg/dL — ABNORMAL HIGH (ref 0.3–0.9)
Total Bilirubin: 2.3 mg/dL — ABNORMAL HIGH (ref 0.3–1.2)
Total Protein: 7.4 g/dL (ref 6.5–8.1)

## 2020-03-11 LAB — BASIC METABOLIC PANEL
Anion gap: 12 (ref 5–15)
BUN: 8 mg/dL (ref 6–20)
CO2: 21 mmol/L — ABNORMAL LOW (ref 22–32)
Calcium: 8.6 mg/dL — ABNORMAL LOW (ref 8.9–10.3)
Chloride: 93 mmol/L — ABNORMAL LOW (ref 98–111)
Creatinine, Ser: 0.89 mg/dL (ref 0.61–1.24)
GFR calc Af Amer: >60 mL/min (ref >60)
GFR calc non Af Amer: >60 mL/min (ref >60)
Glucose, Bld: 105 mg/dL — ABNORMAL HIGH (ref 70–99)
Potassium: 4 mmol/L (ref 3.5–5.1)
Sodium: 126 mmol/L — ABNORMAL LOW (ref 135–145)

## 2020-03-11 LAB — CULTURE, BODY FLUID W GRAM STAIN -BOTTLE: Culture: NO GROWTH

## 2020-03-11 LAB — OSMOLALITY, URINE: Osmolality, Ur: 239 mOsm/kg — ABNORMAL LOW (ref 300–900)

## 2020-03-11 LAB — CBC
HCT: 39.2 % (ref 39.0–52.0)
Hemoglobin: 13.3 g/dL (ref 13.0–17.0)
MCH: 32 pg (ref 26.0–34.0)
MCHC: 33.9 g/dL (ref 30.0–36.0)
MCV: 94.5 fL (ref 80.0–100.0)
Platelets: 358 10*3/uL (ref 150–400)
RBC: 4.15 MIL/uL — ABNORMAL LOW (ref 4.22–5.81)
RDW: 13.8 % (ref 11.5–15.5)
WBC: 12.7 10*3/uL — ABNORMAL HIGH (ref 4.0–10.5)
nRBC: 0 % (ref 0.0–0.2)

## 2020-03-11 LAB — ACID FAST SMEAR (AFB, MYCOBACTERIA): Acid Fast Smear: NEGATIVE

## 2020-03-11 LAB — SODIUM, URINE, RANDOM: Sodium, Ur: 53 mmol/L

## 2020-03-11 NOTE — Progress Notes (Signed)
[facility] Progress Note  Subjective: The patient has no complaints today.  Denies significant abdominal pain.  Just some slight pain on the right side which he has been having.  He is eating okay.  He is being treated for bacterial peritonitis  Objective: Vital signs in last 24 hours: Temp:  [98.3 F (36.8 C)-99.8 F (37.7 C)] 99 F (37.2 C) (08/07 0603) Pulse Rate:  [85-97] 85 (08/07 0603) Resp:  [16-20] 16 (08/07 0603) BP: (120-139)/(83-97) 120/83 (08/07 0603) SpO2:  [93 %-97 %] 93 % (08/07 0603) Weight change:    PE:  No distress  Heart regular rhythm  Abdomen soft and nontender  Lab Results: Results for orders placed or performed during the hospital encounter of 02/23/20 (from the past 24 hour(s))  Body fluid cell count with differential     Status: Abnormal   Collection Time: 03/10/20 12:42 PM  Result Value Ref Range   Fluid Type-FCT ABD PERITO    Color, Fluid AMBER (A) YELLOW   Appearance, Fluid CLOUDY (A) CLEAR   Total Nucleated Cell Count, Fluid 2,725 (H) 0 - 1,000 cu mm   Neutrophil Count, Fluid 55 (H) 0 - 25 %   Lymphs, Fluid 3 %   Monocyte-Macrophage-Serous Fluid 41 (L) 50 - 90 %   Other Cells, Fluid 1 BASOPHIL %  Body fluid culture     Status: None (Preliminary result)   Collection Time: 03/10/20 12:42 PM   Specimen: Body Fluid  Result Value Ref Range   Specimen Description FLUID PERITONEAL    Special Requests NONE    Gram Stain      ABUNDANT WBC PRESENT,BOTH PMN AND MONONUCLEAR NO ORGANISMS SEEN    Culture      NO GROWTH < 24 HOURS Performed at [facility], [address], [place], [place] [address]    Report Status PENDING   CBC     Status: Abnormal   Collection Time: 03/11/20  2:48 AM  Result Value Ref Range   WBC 12.7 (H) 4.0 - 10.5 K/uL   RBC 4.15 (L) 4.22 - 5.81 MIL/uL   Hemoglobin 13.3 13.0 - 17.0 g/dL   HCT 39.2 39 - 52 %   MCV 94.5 80.0 - 100.0 fL   MCH 32.0 26.0 - 34.0 pg   MCHC 33.9 30.0 - 36.0 g/dL   RDW 13.8  11.5 - 15.5 %   Platelets 358 150 - 400 K/uL   nRBC 0.0 0.0 - 0.2 %  Basic metabolic panel     Status: Abnormal   Collection Time: 03/11/20  2:48 AM  Result Value Ref Range   Sodium 126 (L) 135 - 145 mmol/L   Potassium 4.0 3.5 - 5.1 mmol/L   Chloride 93 (L) 98 - 111 mmol/L   CO2 21 (L) 22 - 32 mmol/L   Glucose, Bld 105 (H) 70 - 99 mg/dL   BUN 8 6 - 20 mg/dL   Creatinine, Ser 0.89 0.61 - 1.24 mg/dL   Calcium 8.6 (L) 8.9 - 10.3 mg/dL   GFR calc non Af Amer >60 >60 mL/min   GFR calc Af Amer >60 >60 mL/min   Anion gap 12 5 - 15  Hepatic function panel     Status: Abnormal   Collection Time: 03/11/20  2:48 AM  Result Value Ref Range   Total Protein 7.4 6.5 - 8.1 g/dL   Albumin 2.6 (L) 3.5 - 5.0 g/dL   AST 30 15 - 41 U/L   ALT 20 0 -  44 U/L   Alkaline Phosphatase 139 (H) 38 - 126 U/L   Total Bilirubin 2.3 (H) 0.3 - 1.2 mg/dL   Bilirubin, Direct 0.9 (H) 0.0 - 0.2 mg/dL   Indirect Bilirubin 1.4 (H) 0.3 - 0.9 mg/dL    Studies/Results: DG ABD ACUTE 2+V W 1V CHEST  Result Date: 03/10/2020 CLINICAL DATA:  Abdominal pain for 2 weeks, diarrhea, nausea and weakness for 4 days EXAM: DG ABDOMEN ACUTE W/ 1V CHEST COMPARISON:  CT 03/08/2020 FINDINGS: Few bandlike opacities in the left lung base likely reflecting subsegmental atelectatic change. No consolidation, features of edema, pneumothorax, or effusion. The aorta is calcified. The remaining cardiomediastinal contours are unremarkable. No subdiaphragmatic free air. Multiple clustered loops of borderline dilated small bowel are present within the mid abdomen with mild thickening of the small bowel folds in slight separation which could reflect mural edematous changes. Mild air distention of the colon is noted as well. Cholecystectomy clips and a biliary stent noted in the right upper quadrant. Multilevel degenerative changes in the shoulders, spine, hips and pelvis. Stable bony excrescence along the superolateral acetabular margin on the right is  similar to prior. IMPRESSION: Findings most suggestive of developing ileus versus obstruction with features of mild bowel edema which is fairly nonspecific given patient's recent ascites. Prior cholecystectomy and biliary stenting. Electronically Signed   By: Lovena Le M.D.   On: 03/10/2020 17:29   IR Paracentesis  Result Date: 03/10/2020 INDICATION: Abdominal distention. History of spontaneous bacterial peritonitis. Recurrent ascites. Request for repeat diagnostic and therapeutic paracentesis. EXAM: ULTRASOUND GUIDED RIGHT LOWER QUADRANT PARACENTESIS MEDICATIONS: None. COMPLICATIONS: None immediate. PROCEDURE: Informed written consent was obtained from the patient after a discussion of the risks, benefits and alternatives to treatment. A timeout was performed prior to the initiation of the procedure. Initial ultrasound scanning demonstrates a small amount of ascites within the right lower abdominal quadrant. The right lower abdomen was prepped and draped in the usual sterile fashion. 1% lidocaine was used for local anesthesia. Following this, a 19 gauge, 7-cm, Yueh catheter was introduced. An ultrasound image was saved for documentation purposes. The paracentesis was performed. The catheter was removed and a dressing was applied. The patient tolerated the procedure well without immediate post procedural complication. FINDINGS: A total of approximately 600 mL of hazy, amber colored fluid was removed. Samples were sent to the laboratory as requested by the clinical team. IMPRESSION: Successful ultrasound-guided paracentesis yielding 600 mL of peritoneal fluid. Read by: Ascencion Dike PA-C Electronically Signed   By: Lucrezia Europe M.D.   On: 03/10/2020 12:47      Assessment: Alcohol cirrhosis with ascites and bacterial peritonitis  Plan:   Continue current inpatient antibiotic management    Cassell Clement 03/11/2020, 11:15 AM  Pager: [phone] If no answer or after 5 PM call [phone]

## 2020-03-11 NOTE — Progress Notes (Addendum)
PROGRESS NOTE    Micheal Watson  VHQ:[id number] DOB: Jun 25, 1961 DOA: 02/23/2020 PCP: Patient, No Pcp Per   Brief Narrative: Micheal Watson is a 59 y.o. male with a history of ethanol abuse, recent cholecystitis/cholangitis status post CBD stent and JP drain placement.  Patient presented secondary to worsening abdominal pain, nausea, fever.  Patient found to have evidence of sepsis on admission with concern for SBP and found to have associated bacteremia and hepatic abscess.  Also noted to have biliary stricture which was managed by GI with placement of the stent.  Currently on IV antibiotics for the treatment of SBP and bacteremia with concern of possible worsening liver abscess.   Assessment & Plan:   Principal Problem:   Sepsis following intra-abdominal surgery ([place]) Active Problems:   Hyperbilirubinemia   Essential hypertension   Alcohol dependence with uncomplicated withdrawal ([place])   Sepsis Present on admission.  Concern for possible SBP confirmed by cell count from paracentesis.  Patient was initially managed on Zosyn which was transitioned to ceftriaxone and Flagyl IV. Patient is not on Bactrim and Augmentin.  SBP In setting of recent abdominal surgery (laparoscopic cholecystectomy on 4/19) with associated obstructed CBD stent.  Patient underwent multiple paracenteses with fluid analysis consistent with SBP.  Previous body fluid cultures appear to have been canceled.  Only most recent fluid culture from 8/2 is resulting in no growth to date.  ERCP performed on 7/22 by GI with stent removal and replacement.  Patient has been on ceftriaxone and Flagyl IV for treatment of SBP.  CT scan on 8/4 significant for changes consistent with peritonitis. Patient initially with down trended white blood cell count which has significantly increased in most recent paracentesis (8/6) -Infectious disease recommendations: Escalate to meropenem -General surgery consult  Hepatic  abscess Seen on recent CT scan from 7/26.  Patient still has discontinued right upper quadrant abdominal pain and spiked spiked fever 100.4 F overnight.  Leukocytosis is persistent but mild.  CT abdomen shows improving abscesses.  Biliary stricture Patient underwent ERCP as mentioned above.  Stent placement mentioned above.  Hyponatremia Secondary to alcohol use.  Mostly mild. In setting of hepatic disease. Asymptomatic  Polymicrobial bacteremia In setting of SBP.  Blood cultures significant for Proteus penneri, E. coli, Klebsiella pneumonia.  Patient is on antibiotics as mentioned above.  Alcohol abuse No current withdrawal symptoms.   DVT prophylaxis: Lovenox Code Status:   Code Status: Full Code Family Communication: None at bedside Disposition Plan: Discharge home in several days pending significant improvement of SBP as there is a concern for high morbidity if patient discharges and attempts to manage as an outpatient.   Consultants:   Gastroenterology  Infectious disease  Interventional radiology  Procedures:   ERCP (7/22) Impression:               - The upper third of the main bile duct and common                            hepatic duct were moderately dilated, secondary to                            a stricture.                           - One stent was removed from the biliary tree.                           -  Cells for cytology obtained in the upper third of                            the main bile duct.                           - One temporary stent was placed into the common                            bile duct.  Recommendation:           - Avoid aspirin and nonsteroidal anti-inflammatory                            medicines for 3 days.                           - Watch for pancreatitis, bleeding, perforation,                            and cholangitis.                           - Observe patient's clinical course.                           - Continue  antibiotics.                           - May need paracentesis, pending clinical course.                           - May need EUS/cholangioscopy, [place]-the-road,                            pending clinical course.                           - Alcohol cessation.                           - Clear liquid diet today.                           [name] will follow.   PARACENTESIS (7/23, 7/26, 8/2, 8/6)  Antimicrobials:  Zosyn IV  Ceftriaxone IV  Metronidazole IV  Bactrim PO  Augmentin PO   Subjective: Interpreter: Lenda Kelp [phone]  No abdominal pain today.  Objective: Vitals:   03/10/20 0421 03/10/20 1400 03/10/20 2059 03/11/20 0603  BP: 118/84 (!) 135/97 139/83 120/83  Pulse: 91 90 97 85  Resp:  18 20 16   Temp: 99.3 F (37.4 C) 98.3 F (36.8 C) 99.8 F (37.7 C) 99 F (37.2 C)  TempSrc: Oral Oral  Oral  SpO2: 97% 97% 93% 93%  Weight:      Height:        Intake/Output Summary (Last 24 hours) at 03/11/2020 0930 Last data filed at 03/11/2020 0826 Gross per 24 hour  Intake 518.18 ml  Output 150 ml  Net  368.18 ml   Filed Weights   02/27/20 0351 03/03/20 0439  Weight: (!) 97 kg 91.8 kg    Examination:  General exam: Appears calm and comfortable Respiratory system: Clear to auscultation. Respiratory effort normal. Cardiovascular system: S1 & S2 heard, RRR. No murmurs, rubs, gallops or clicks. Gastrointestinal system: Abdomen is distended, soft and tender in right quadrant. No organomegaly or masses felt. Normal bowel sounds heard. Central nervous system: Alert and oriented. No focal neurological deficits. Musculoskeletal: No edema. No calf tenderness Skin: No cyanosis. No rashes Psychiatry: Judgement and insight appear normal. Mood & affect appropriate.    Data Reviewed: I have personally reviewed following labs and imaging studies  CBC Lab Results  Component Value Date   WBC 12.7 (H) 03/11/2020   RBC 4.15 (L) 03/11/2020   HGB 13.3 03/11/2020   HCT 39.2  03/11/2020   MCV 94.5 03/11/2020   MCH 32.0 03/11/2020   PLT 358 03/11/2020   MCHC 33.9 03/11/2020   RDW 13.8 03/11/2020   LYMPHSABS 2.3 03/02/2020   MONOABS 1.1 (H) 03/02/2020   EOSABS 0.1 03/02/2020   BASOSABS 0.1 73/22/0254     Last metabolic panel Lab Results  Component Value Date   NA 126 (L) 03/11/2020   K 4.0 03/11/2020   CL 93 (L) 03/11/2020   CO2 21 (L) 03/11/2020   BUN 8 03/11/2020   CREATININE 0.89 03/11/2020   GLUCOSE 105 (H) 03/11/2020   GFRNONAA >60 03/11/2020   GFRAA >60 03/11/2020   CALCIUM 8.6 (L) 03/11/2020   PHOS 3.3 03/07/2020   PROT 7.4 03/11/2020   ALBUMIN 2.6 (L) 03/11/2020   BILITOT 2.3 (H) 03/11/2020   ALKPHOS 139 (H) 03/11/2020   AST 30 03/11/2020   ALT 20 03/11/2020   ANIONGAP 12 03/11/2020    CBG (last 3)  No results for input(s): GLUCAP in the last 72 hours.   GFR: Estimated Creatinine Clearance: 100 mL/min (by C-G formula based on SCr of 0.89 mg/dL).  Coagulation Profile: No results for input(s): INR, PROTIME in the last 168 hours.  Recent Results (from the past 240 hour(s))  Gram stain     Status: None   Collection Time: 03/06/20 10:05 AM   Specimen: Abdomen; Peritoneal Fluid  Result Value Ref Range Status   Specimen Description PERITONEAL FLUID  Final   Special Requests ABDOMEN  Final   Gram Stain   Final    CYTOSPIN SMEAR WBC PRESENT,BOTH PMN AND MONONUCLEAR NO ORGANISMS SEEN Performed at [facility], [address], [place], [place] [address]    Report Status 03/08/2020 FINAL  Final  Culture, body fluid-bottle     Status: None (Preliminary result)   Collection Time: 03/06/20 10:05 AM   Specimen: Peritoneal Washings  Result Value Ref Range Status   Specimen Description PERITONEAL FLUID  Final   Special Requests ABDOMEN  Final   Culture   Final    NO GROWTH 4 DAYS Performed at [facility] [address] [place], [place] [address]    Report Status PENDING  Incomplete  Body fluid culture     Status:  None (Preliminary result)   Collection Time: 03/10/20 12:42 PM   Specimen: Body Fluid  Result Value Ref Range Status   Specimen Description FLUID PERITONEAL  Final   Special Requests NONE  Final   Gram Stain   Final    ABUNDANT WBC PRESENT,BOTH PMN AND MONONUCLEAR NO ORGANISMS SEEN    Culture   Final    NO  GROWTH < 24 HOURS Performed at [facility], [place] [address], [place],  [address]    Report Status PENDING  Incomplete        Radiology Studies: DG ABD ACUTE 2+V W 1V CHEST  Result Date: 03/10/2020 CLINICAL DATA:  Abdominal pain for 2 weeks, diarrhea, nausea and weakness for 4 days EXAM: DG ABDOMEN ACUTE W/ 1V CHEST COMPARISON:  CT 03/08/2020 FINDINGS: Few bandlike opacities in the left lung base likely reflecting subsegmental atelectatic change. No consolidation, features of edema, pneumothorax, or effusion. The aorta is calcified. The remaining cardiomediastinal contours are unremarkable. No subdiaphragmatic free air. Multiple clustered loops of borderline dilated small bowel are present within the mid abdomen with mild thickening of the small bowel folds in slight separation which could reflect mural edematous changes. Mild air distention of the colon is noted as well. Cholecystectomy clips and a biliary stent noted in the right upper quadrant. Multilevel degenerative changes in the shoulders, spine, hips and pelvis. Stable bony excrescence along the superolateral acetabular margin on the right is similar to prior. IMPRESSION: Findings most suggestive of developing ileus versus obstruction with features of mild bowel edema which is fairly nonspecific given patient's recent ascites. Prior cholecystectomy and biliary stenting. Electronically Signed   By: Lovena Le M.D.   On: 03/10/2020 17:29   IR Paracentesis  Result Date: 03/10/2020 INDICATION: Abdominal distention. History of spontaneous bacterial peritonitis. Recurrent ascites. Request for repeat diagnostic and  therapeutic paracentesis. EXAM: ULTRASOUND GUIDED RIGHT LOWER QUADRANT PARACENTESIS MEDICATIONS: None. COMPLICATIONS: None immediate. PROCEDURE: Informed written consent was obtained from the patient after a discussion of the risks, benefits and alternatives to treatment. A timeout was performed prior to the initiation of the procedure. Initial ultrasound scanning demonstrates a small amount of ascites within the right lower abdominal quadrant. The right lower abdomen was prepped and draped in the usual sterile fashion. 1% lidocaine was used for local anesthesia. Following this, a 19 gauge, 7-cm, Yueh catheter was introduced. An ultrasound image was saved for documentation purposes. The paracentesis was performed. The catheter was removed and a dressing was applied. The patient tolerated the procedure well without immediate post procedural complication. FINDINGS: A total of approximately 600 mL of hazy, amber colored fluid was removed. Samples were sent to the laboratory as requested by the clinical team. IMPRESSION: Successful ultrasound-guided paracentesis yielding 600 mL of peritoneal fluid. Read by: Ascencion Dike PA-C Electronically Signed   By: Lucrezia Europe M.D.   On: 03/10/2020 12:47        Scheduled Meds: . enoxaparin (LOVENOX) injection  45 mg Subcutaneous Q24H  . folic acid  1 mg Oral Daily  . furosemide  40 mg Oral Daily  . Gerhardt's butt cream   Topical TID  . multivitamin with minerals  1 tablet Oral Daily  . pantoprazole  40 mg Oral Daily  . sodium chloride flush  3 mL Intravenous Q12H  . spironolactone  50 mg Oral Daily  . thiamine  100 mg Oral Daily   Continuous Infusions: . sodium chloride Stopped (03/06/20 0419)  . albumin human    . meropenem (MERREM) IV 1 g (03/11/20 8916)     LOS: 16 days     Cordelia Poche, MD [facility] 03/11/2020, 9:30 AM  If 7PM-7AM, please contact night-coverage [email or web address]

## 2020-03-11 NOTE — Progress Notes (Signed)
[facility] Progress Note  16 Days Post-Op  Subjective: CC-  Micheal Watson is a 59yo male PMH cirrhosis who has had a complex postoperative course following laparoscopic cholecystectomy 11/22/2019. He developed a bile leak and underwent ERCP w/ stent placement 11/29/2019. Due to persistent ascites he was take for diagnostic laparoscopy, evacuation of bilious ascites (3L), blake drain placement on 12/07/2019. He was readmitted with an occluded stent and underwent ERCP with stent exchange 02/24/2020. Since that time he has required paracentesis 7/23, 7/26, 8/2, and 8/6. Concern for possible SBP confirmed by cell count from paracentesis. Most recent peritoneal fluid cultures are without growth and previously bacteremic with E. Coli, K. Pneumoniae and P. Penneri. ID is following and plans 1 month of bactrim and augmentin. Patient was also noted to have small liver abscesses on CT 7/29, too small for IR to drain; these are improving on most recent CT 8/4. General surgery asked to see again today due to recurrent abdominal pain yesterday, worsening leukocytosis WBC 12.7, and cell count of 2725 from fluid obtained by paracentesis yesterday. Antibiotics broadened to merrem. Today he states that he feels well. He has no abdominal pain. Denies n/v. BM this morning normal. Tolerating a diet.   Objective: Vital signs in last 24 hours: Temp:  [98.3 F (36.8 C)-99.8 F (37.7 C)] 99 F (37.2 C) (08/07 0603) Pulse Rate:  [85-97] 85 (08/07 0603) Resp:  [16-20] 16 (08/07 0603) BP: (120-139)/(83-97) 120/83 (08/07 0603) SpO2:  [93 %-97 %] 93 % (08/07 0603) Last BM Date: 03/11/20  Intake/Output from previous day: 08/06 0701 - 08/07 0700 In: 518.2 [P.O.:490; IV Piggyback:28.2] Out: -  Intake/Output this shift: Total I/O In: 271.9 [IV Piggyback:271.9] Out: 150 [Urine:150]  PE: Gen:  Alert, NAD, pleasant HEENT: EOM's intact, pupils equal and round Card:  RRR Pulm:  CTAB, no W/R/R, rate and  effort normal Abd: Soft, mild distension, nontender to light and deep palpation with no rigidity or guarding, no peritonitis, +BS, no HSM Skin: no rashes noted, warm and dry  Lab Results:  Recent Labs    03/10/20 0823 03/11/20 0248  WBC 11.4* 12.7*  HGB 13.7 13.3  HCT 40.8 39.2  PLT 366 358   BMET Recent Labs    03/10/20 0823 03/11/20 0248  NA 129* 126*  K 4.4 4.0  CL 96* 93*  CO2 22 21*  GLUCOSE 113* 105*  BUN 6 8  CREATININE 0.80 0.89  CALCIUM 8.6* 8.6*   PT/INR No results for input(s): LABPROT, INR in the last 72 hours. CMP     Component Value Date/Time   NA 126 (L) 03/11/2020 0248   K 4.0 03/11/2020 0248   CL 93 (L) 03/11/2020 0248   CO2 21 (L) 03/11/2020 0248   GLUCOSE 105 (H) 03/11/2020 0248   BUN 8 03/11/2020 0248   CREATININE 0.89 03/11/2020 0248   CALCIUM 8.6 (L) 03/11/2020 0248   PROT 7.4 03/11/2020 0248   ALBUMIN 2.6 (L) 03/11/2020 0248   AST 30 03/11/2020 0248   ALT 20 03/11/2020 0248   ALKPHOS 139 (H) 03/11/2020 0248   BILITOT 2.3 (H) 03/11/2020 0248   GFRNONAA >60 03/11/2020 0248   GFRAA >60 03/11/2020 0248   Lipase     Component Value Date/Time   LIPASE 61 (H) 03/05/2020 0326       Studies/Results: DG ABD ACUTE 2+V W 1V CHEST  Result Date: 03/10/2020 CLINICAL DATA:  Abdominal pain for 2 weeks, diarrhea, nausea and weakness for 4 days EXAM: DG ABDOMEN  ACUTE W/ 1V CHEST COMPARISON:  CT 03/08/2020 FINDINGS: Few bandlike opacities in the left lung base likely reflecting subsegmental atelectatic change. No consolidation, features of edema, pneumothorax, or effusion. The aorta is calcified. The remaining cardiomediastinal contours are unremarkable. No subdiaphragmatic free air. Multiple clustered loops of borderline dilated small bowel are present within the mid abdomen with mild thickening of the small bowel folds in slight separation which could reflect mural edematous changes. Mild air distention of the colon is noted as well. Cholecystectomy  clips and a biliary stent noted in the right upper quadrant. Multilevel degenerative changes in the shoulders, spine, hips and pelvis. Stable bony excrescence along the superolateral acetabular margin on the right is similar to prior. IMPRESSION: Findings most suggestive of developing ileus versus obstruction with features of mild bowel edema which is fairly nonspecific given patient's recent ascites. Prior cholecystectomy and biliary stenting. Electronically Signed   By: Lovena Le M.D.   On: 03/10/2020 17:29   IR Paracentesis  Result Date: 03/10/2020 INDICATION: Abdominal distention. History of spontaneous bacterial peritonitis. Recurrent ascites. Request for repeat diagnostic and therapeutic paracentesis. EXAM: ULTRASOUND GUIDED RIGHT LOWER QUADRANT PARACENTESIS MEDICATIONS: None. COMPLICATIONS: None immediate. PROCEDURE: Informed written consent was obtained from the patient after a discussion of the risks, benefits and alternatives to treatment. A timeout was performed prior to the initiation of the procedure. Initial ultrasound scanning demonstrates a small amount of ascites within the right lower abdominal quadrant. The right lower abdomen was prepped and draped in the usual sterile fashion. 1% lidocaine was used for local anesthesia. Following this, a 19 gauge, 7-cm, Yueh catheter was introduced. An ultrasound image was saved for documentation purposes. The paracentesis was performed. The catheter was removed and a dressing was applied. The patient tolerated the procedure well without immediate post procedural complication. FINDINGS: A total of approximately 600 mL of hazy, amber colored fluid was removed. Samples were sent to the laboratory as requested by the clinical team. IMPRESSION: Successful ultrasound-guided paracentesis yielding 600 mL of peritoneal fluid. Read by: Ascencion Dike PA-C Electronically Signed   By: Lucrezia Europe M.D.   On: 03/10/2020 12:47    Anti-infectives: Anti-infectives (From  admission, onward)   Start     Dose/Rate Route Frequency Ordered Stop   03/10/20 1600  meropenem (MERREM) 1 g in sodium chloride 0.9 % 100 mL IVPB     Discontinue     1 g 200 mL/hr over 30 Minutes Intravenous Every 8 hours 03/10/20 1538     03/09/20 1030  sulfamethoxazole-trimethoprim (BACTRIM DS) 800-160 MG per tablet 1 tablet  Status:  Discontinued        1 tablet Oral Every 12 hours 03/09/20 0939 03/10/20 1530   03/09/20 1030  amoxicillin-clavulanate (AUGMENTIN) 875-125 MG per tablet 1 tablet  Status:  Discontinued        1 tablet Oral Every 12 hours 03/09/20 0939 03/10/20 1530   03/09/20 0000  amoxicillin-clavulanate (AUGMENTIN) 875-125 MG tablet     Discontinue     1 tablet Oral 2 times daily 03/09/20 0949 04/08/20 2359   03/09/20 0000  sulfamethoxazole-trimethoprim (BACTRIM DS) 800-160 MG tablet     Discontinue     1 tablet Oral 2 times daily 03/09/20 0949 04/08/20 2359   03/03/20 2200  cefTRIAXone (ROCEPHIN) 2 g in sodium chloride 0.9 % 100 mL IVPB  Status:  Discontinued        2 g 200 mL/hr over 30 Minutes Intravenous Every 24 hours 03/03/20 1010 03/09/20 0939  03/03/20 1200  metroNIDAZOLE (FLAGYL) IVPB 500 mg  Status:  Discontinued        500 mg 100 mL/hr over 60 Minutes Intravenous Every 8 hours 03/03/20 1010 03/09/20 0939   03/03/20 1000  amoxicillin-clavulanate (AUGMENTIN) 875-125 MG per tablet 1 tablet  Status:  Discontinued        1 tablet Oral Every 12 hours 03/03/20 0735 03/03/20 1010   02/25/20 2000  cefTRIAXone (ROCEPHIN) 2 g in sodium chloride 0.9 % 100 mL IVPB  Status:  Discontinued        2 g 200 mL/hr over 30 Minutes Intravenous Every 24 hours 02/24/20 1847 03/03/20 0735   02/25/20 1515  metroNIDAZOLE (FLAGYL) IVPB 500 mg  Status:  Discontinued        500 mg 100 mL/hr over 60 Minutes Intravenous Every 8 hours 02/25/20 1455 03/03/20 0735   02/24/20 1830  cefTRIAXone (ROCEPHIN) 2 g in sodium chloride 0.9 % 100 mL IVPB  Status:  Discontinued        2 g 200 mL/hr over  30 Minutes Intravenous Every 24 hours 02/24/20 1741 02/24/20 1847   02/24/20 1200  piperacillin-tazobactam (ZOSYN) IVPB 3.375 g  Status:  Discontinued        3.375 g 100 mL/hr over 30 Minutes Intravenous Every 6 hours 02/24/20 0934 02/24/20 1009   02/24/20 1200  piperacillin-tazobactam (ZOSYN) IVPB 3.375 g  Status:  Discontinued        3.375 g 12.5 mL/hr over 240 Minutes Intravenous Every 8 hours 02/24/20 1009 02/24/20 1741   02/24/20 0245  piperacillin-tazobactam (ZOSYN) IVPB 3.375 g        3.375 g 100 mL/hr over 30 Minutes Intravenous  Once 02/24/20 0237 02/24/20 0535       Assessment/Plan 11/22/2019 laparoscopic cholecystectomy, Dr. Grandville Silos 11/29/19 ERCP w/ stent placement for bile leak Dr. Watt Climes  12/07/2019 diagnostic laparoscopy, evacuation of bilious ascites (3L), blake drain placement Dr. Donne Hazel 12/23/2019 blake drain removed in [place] office  02/24/2020 ERCP, stent exchange  Paracentesis 7/23, 7/26, 8/2, and 8/6 SBP 02/28/20 - CT A/P w/ stable low-density liver lesions/small abscesses, improved on CT 03/08/2020  Patient with alcoholic cirrhosis with ascites and bacterial peritonitis. ID is following and has the patient on merrem. We were asked to see due to concern that he is not responding to antibiotic regimen. Fluid obtained by paracentesis yesterday shows cell count 2725. I'm not sure why cell count is up. Today his abdominal exam is benign. He is tolerating a diet and having bowel function. Would not recommend any acute surgical intervention.    LOS: 16 days    [facility] 03/11/2020, 12:56 PM Please see Amion for pager number during day hours 7:00am-4:30pm

## 2020-03-12 LAB — BASIC METABOLIC PANEL
Anion gap: 12 (ref 5–15)
BUN: 11 mg/dL (ref 6–20)
CO2: 23 mmol/L (ref 22–32)
Calcium: 9 mg/dL (ref 8.9–10.3)
Chloride: 95 mmol/L — ABNORMAL LOW (ref 98–111)
Creatinine, Ser: 0.9 mg/dL (ref 0.61–1.24)
GFR calc Af Amer: >60 mL/min (ref >60)
GFR calc non Af Amer: >60 mL/min (ref >60)
Glucose, Bld: 156 mg/dL — ABNORMAL HIGH (ref 70–99)
Potassium: 4.1 mmol/L (ref 3.5–5.1)
Sodium: 130 mmol/L — ABNORMAL LOW (ref 135–145)

## 2020-03-12 MED ORDER — HYDROCORTISONE ACETATE 25 MG RE SUPP
25.0000 mg | Freq: Two times a day (BID) | RECTAL | Status: DC
  Administered 2020-03-12 – 2020-03-19 (×5): 25 mg via RECTAL
  Filled 2020-03-12 (×15): qty 1

## 2020-03-12 NOTE — Progress Notes (Addendum)
PROGRESS NOTE    Micheal Watson  JKK:[id number] DOB: 06-01-1961 DOA: 02/23/2020 PCP: Patient, No Pcp Per   Brief Narrative: Micheal Watson is a 59 y.o. male with a history of ethanol abuse, recent cholecystitis/cholangitis status post CBD stent and JP drain placement.  Patient presented secondary to worsening abdominal pain, nausea, fever.  Patient found to have evidence of sepsis on admission with concern for SBP and found to have associated bacteremia and hepatic abscess.  Also noted to have biliary stricture which was managed by GI with placement of the stent.  Currently on IV antibiotics for the treatment of SBP and bacteremia with concern of possible worsening liver abscess.    Assessment & Plan:   Principal Problem:   Sepsis following intra-abdominal surgery ([place]) Active Problems:   Hyperbilirubinemia   Essential hypertension   Alcohol dependence with uncomplicated withdrawal ([place])   Sepsis Present on admission.  Concern for possible SBP confirmed by cell count from paracentesis.  Patient was initially managed on Zosyn which was transitioned to ceftriaxone and Flagyl IV. Patient is not on Bactrim and Augmentin.  SBP In setting of recent abdominal surgery (laparoscopic cholecystectomy on 4/19) with associated obstructed CBD stent.  Patient underwent multiple paracenteses with fluid analysis consistent with SBP.  Previous body fluid cultures appear to have been canceled.  Only most recent fluid culture from 8/2 is resulting in no growth to date.  ERCP performed on 7/22 by GI with stent removal and replacement.  Patient has been on ceftriaxone and Flagyl IV for treatment of SBP.  CT scan on 8/4 significant for changes consistent with peritonitis. Patient initially with down trended white blood cell count which has significantly increased in most recent paracentesis (8/6) -Infectious disease recommendations: Escalate to meropenem -General surgery recommendations: no  surgical options at this time  Hepatic abscess Seen on recent CT scan from 7/26.  Patient still has discontinued right upper quadrant abdominal pain and spiked spiked fever 100.4 F overnight.  Leukocytosis is persistent but mild.  CT abdomen shows improving abscesses.  Biliary stricture Patient underwent ERCP as mentioned above.  Stent placement mentioned above.  Hyponatremia Secondary to alcohol use.  Mostly mild. In setting of hepatic disease. Asymptomatic  Polymicrobial bacteremia In setting of SBP.  Blood cultures significant for Proteus penneri, E. coli, Klebsiella pneumonia.  Patient is on antibiotics as mentioned above.  Alcohol abuse No current withdrawal symptoms.  Rectal pain Nothing seen on physical exam. -Anusol suppository trial   DVT prophylaxis: Lovenox Code Status:   Code Status: Full Code Family Communication: None at bedside Disposition Plan: Discharge home in several days pending significant improvement of SBP as there is a concern for high morbidity if patient discharges and attempts to manage as an outpatient.   Consultants:   Gastroenterology  Infectious disease  Interventional radiology  Procedures:   ERCP (7/22) Impression:               - The upper third of the main bile duct and common                            hepatic duct were moderately dilated, secondary to                            a stricture.                           -  One stent was removed from the biliary tree.                           - Cells for cytology obtained in the upper third of                            the main bile duct.                           - One temporary stent was placed into the common                            bile duct.  Recommendation:           - Avoid aspirin and nonsteroidal anti-inflammatory                            medicines for 3 days.                           - Watch for pancreatitis, bleeding, perforation,                            and  cholangitis.                           - Observe patient's clinical course.                           - Continue antibiotics.                           - May need paracentesis, pending clinical course.                           - May need EUS/cholangioscopy, down-the-road,                            pending clinical course.                           - Alcohol cessation.                           - Clear liquid diet today.                           [name] will follow.   PARACENTESIS (7/23, 7/26, 8/2, 8/6)  Antimicrobials:  Zosyn IV  Ceftriaxone IV  Metronidazole IV  Bactrim PO  Augmentin PO  Meropenem IV   Subjective: Interpreter: Cletus Gash [phone]  No abdominal pain, nausea, vomiting. Afebrile. Continued pain with bowel movements without associated blood. No associated itching.  Objective: Vitals:   03/11/20 0603 03/11/20 1344 03/11/20 2130 03/12/20 0409  BP: 120/83 119/75 123/80 106/81  Pulse: 85 87 88 81  Resp: 16 17 20 19   Temp: 99 F (37.2 C) 98.2 F (36.8 C) 98.6 F (37 C) 99.1 F (37.3 C)  TempSrc: Oral Oral Oral  Oral  SpO2: 93% 97% 98% 93%  Weight:      Height:        Intake/Output Summary (Last 24 hours) at 03/12/2020 0941 Last data filed at 03/12/2020 0936 Gross per 24 hour  Intake 1051.89 ml  Output 975 ml  Net 76.89 ml   Filed Weights   02/27/20 0351 03/03/20 0439  Weight: (!) 97 kg 91.8 kg    Examination:  General exam: Appears calm and comfortable Respiratory system: Clear to auscultation. Respiratory effort normal. Cardiovascular system: S1 & S2 heard, RRR. No murmurs, rubs, gallops or clicks. Gastrointestinal system: Abdomen is distended, soft and tender in right side and lower abdomen. No organomegaly or masses felt. Normal bowel sounds heard. GU: rectal exam significant for no fissures or hemorrhoids. Pain upon entering the rectum. Good tone. Central nervous system: Alert and oriented. No focal neurological  deficits. Musculoskeletal: No edema. No calf tenderness Skin: No cyanosis. No rashes Psychiatry: Judgement and insight appear normal. Mood & affect appropriate.     Data Reviewed: I have personally reviewed following labs and imaging studies  CBC Lab Results  Component Value Date   WBC 12.7 (H) 03/11/2020   RBC 4.15 (L) 03/11/2020   HGB 13.3 03/11/2020   HCT 39.2 03/11/2020   MCV 94.5 03/11/2020   MCH 32.0 03/11/2020   PLT 358 03/11/2020   MCHC 33.9 03/11/2020   RDW 13.8 03/11/2020   LYMPHSABS 2.3 03/02/2020   MONOABS 1.1 (H) 03/02/2020   EOSABS 0.1 03/02/2020   BASOSABS 0.1 40/98/1191     Last metabolic panel Lab Results  Component Value Date   NA 126 (L) 03/11/2020   K 4.0 03/11/2020   CL 93 (L) 03/11/2020   CO2 21 (L) 03/11/2020   BUN 8 03/11/2020   CREATININE 0.89 03/11/2020   GLUCOSE 105 (H) 03/11/2020   GFRNONAA >60 03/11/2020   GFRAA >60 03/11/2020   CALCIUM 8.6 (L) 03/11/2020   PHOS 3.3 03/07/2020   PROT 7.4 03/11/2020   ALBUMIN 2.6 (L) 03/11/2020   BILITOT 2.3 (H) 03/11/2020   ALKPHOS 139 (H) 03/11/2020   AST 30 03/11/2020   ALT 20 03/11/2020   ANIONGAP 12 03/11/2020    CBG (last 3)  No results for input(s): GLUCAP in the last 72 hours.   GFR: Estimated Creatinine Clearance: 100 mL/min (by C-G formula based on SCr of 0.89 mg/dL).  Coagulation Profile: No results for input(s): INR, PROTIME in the last 168 hours.  Recent Results (from the past 240 hour(s))  Gram stain     Status: None   Collection Time: 03/06/20 10:05 AM   Specimen: Abdomen; Peritoneal Fluid  Result Value Ref Range Status   Specimen Description PERITONEAL FLUID  Final   Special Requests ABDOMEN  Final   Gram Stain   Final    CYTOSPIN SMEAR WBC PRESENT,BOTH PMN AND MONONUCLEAR NO ORGANISMS SEEN Performed at [facility], [address], [place], [place] [address]    Report Status 03/08/2020 FINAL  Final  Culture, body fluid-bottle     Status: None   Collection  Time: 03/06/20 10:05 AM   Specimen: Peritoneal Washings  Result Value Ref Range Status   Specimen Description PERITONEAL FLUID  Final   Special Requests ABDOMEN  Final   Culture   Final    NO GROWTH 5 DAYS Performed at [facility] [address], [place], [place] [address]    Report Status 03/11/2020 FINAL  Final  Body fluid culture  Status: None (Preliminary result)   Collection Time: 03/10/20 12:42 PM   Specimen: Body Fluid  Result Value Ref Range Status   Specimen Description FLUID PERITONEAL  Final   Special Requests NONE  Final   Gram Stain   Final    ABUNDANT WBC PRESENT,BOTH PMN AND MONONUCLEAR NO ORGANISMS SEEN    Culture   Final    NO GROWTH 2 DAYS Performed at [facility], [address], [place], [place] [address]    Report Status PENDING  Incomplete  Acid Fast Smear (AFB)     Status: None   Collection Time: 03/10/20 12:42 PM   Specimen: Peritoneal  Result Value Ref Range Status   AFB Specimen Processing Concentration  Final   Acid Fast Smear Negative  Final    Comment: (NOTE) Performed At: [facility] [place], [place] [id number] Rush Farmer MD YD:[id number]    Source (AFB) PERITONEAL  Final    Comment: FLUID Performed at [facility], [place] [address], [place],  [address]         Radiology Studies: DG ABD ACUTE 2+V W 1V CHEST  Result Date: 03/10/2020 CLINICAL DATA:  Abdominal pain for 2 weeks, diarrhea, nausea and weakness for 4 days EXAM: DG ABDOMEN ACUTE W/ 1V CHEST COMPARISON:  CT 03/08/2020 FINDINGS: Few bandlike opacities in the left lung base likely reflecting subsegmental atelectatic change. No consolidation, features of edema, pneumothorax, or effusion. The aorta is calcified. The remaining cardiomediastinal contours are unremarkable. No subdiaphragmatic free air. Multiple clustered loops of borderline dilated small bowel are present within the mid abdomen with mild thickening of the small bowel  folds in slight separation which could reflect mural edematous changes. Mild air distention of the colon is noted as well. Cholecystectomy clips and a biliary stent noted in the right upper quadrant. Multilevel degenerative changes in the shoulders, spine, hips and pelvis. Stable bony excrescence along the superolateral acetabular margin on the right is similar to prior. IMPRESSION: Findings most suggestive of developing ileus versus obstruction with features of mild bowel edema which is fairly nonspecific given patient's recent ascites. Prior cholecystectomy and biliary stenting. Electronically Signed   By: Lovena Le M.D.   On: 03/10/2020 17:29   IR Paracentesis  Result Date: 03/10/2020 INDICATION: Abdominal distention. History of spontaneous bacterial peritonitis. Recurrent ascites. Request for repeat diagnostic and therapeutic paracentesis. EXAM: ULTRASOUND GUIDED RIGHT LOWER QUADRANT PARACENTESIS MEDICATIONS: None. COMPLICATIONS: None immediate. PROCEDURE: Informed written consent was obtained from the patient after a discussion of the risks, benefits and alternatives to treatment. A timeout was performed prior to the initiation of the procedure. Initial ultrasound scanning demonstrates a small amount of ascites within the right lower abdominal quadrant. The right lower abdomen was prepped and draped in the usual sterile fashion. 1% lidocaine was used for local anesthesia. Following this, a 19 gauge, 7-cm, Yueh catheter was introduced. An ultrasound image was saved for documentation purposes. The paracentesis was performed. The catheter was removed and a dressing was applied. The patient tolerated the procedure well without immediate post procedural complication. FINDINGS: A total of approximately 600 mL of hazy, amber colored fluid was removed. Samples were sent to the laboratory as requested by the clinical team. IMPRESSION: Successful ultrasound-guided paracentesis yielding 600 mL of peritoneal fluid.  Read by: Ascencion Dike PA-C Electronically Signed   By: Lucrezia Europe M.D.   On: 03/10/2020 12:47        Scheduled Meds: . enoxaparin (LOVENOX) injection  45 mg Subcutaneous Q24H  . folic acid  1 mg Oral Daily  . furosemide  40 mg Oral Daily  . Gerhardt's butt cream   Topical TID  . multivitamin with minerals  1 tablet Oral Daily  . pantoprazole  40 mg Oral Daily  . sodium chloride flush  3 mL Intravenous Q12H  . spironolactone  50 mg Oral Daily  . thiamine  100 mg Oral Daily   Continuous Infusions: . sodium chloride Stopped (03/06/20 0419)  . albumin human    . meropenem (MERREM) IV 1 g (03/12/20 0518)     LOS: 17 days     Cordelia Poche, MD [facility] 03/12/2020, 9:41 AM  If 7PM-7AM, please contact night-coverage [email or web address]

## 2020-03-12 NOTE — Progress Notes (Signed)
Patient ID: Micheal Watson, male   DOB: May 17, 1961, 59 y.o.   MRN: [id number] [facility] Surgery Progress Note:   17 Days Post-Op  Subjective: Mental status is alert. . Objective: Vital signs in last 24 hours: Temp:  [98.2 F (36.8 C)-99.1 F (37.3 C)] 99.1 F (37.3 C) (08/08 0409) Pulse Rate:  [81-88] 81 (08/08 0409) Resp:  [17-20] 19 (08/08 0409) BP: (106-123)/(75-81) 106/81 (08/08 0409) SpO2:  [93 %-98 %] 93 % (08/08 0409)  Intake/Output from previous day: 08/07 0701 - 08/08 0700 In: 751.9 [P.O.:480; IV Piggyback:271.9] Out: 1125 [Urine:1125] Intake/Output this shift: Total I/O In: 300 [IV Piggyback:300] Out: -   Physical Exam: Work of breathing is not labored.    Lab Results:  Results for orders placed or performed during the hospital encounter of 02/23/20 (from the past 48 hour(s))  Body fluid cell count with differential     Status: Abnormal   Collection Time: 03/10/20 12:42 PM  Result Value Ref Range   Fluid Type-FCT ABD PERITO     Comment: CORRECTED ON 08/06 AT 1253: PREVIOUSLY REPORTED AS ABDOMEN   Color, Fluid AMBER (A) YELLOW   Appearance, Fluid CLOUDY (A) CLEAR   Total Nucleated Cell Count, Fluid 2,725 (H) 0 - 1,000 cu mm   Neutrophil Count, Fluid 55 (H) 0 - 25 %   Lymphs, Fluid 3 %   Monocyte-Macrophage-Serous Fluid 41 (L) 50 - 90 %   Other Cells, Fluid 1 BASOPHIL %    Comment: OTHER CELLS IDENTIFIED AS MESOTHELIAL CELLS Performed at [facility] [address] [place], [place] [address]   Body fluid culture     Status: None (Preliminary result)   Collection Time: 03/10/20 12:42 PM   Specimen: Body Fluid  Result Value Ref Range   Specimen Description FLUID PERITONEAL    Special Requests NONE    Gram Stain      ABUNDANT WBC PRESENT,BOTH PMN AND MONONUCLEAR NO ORGANISMS SEEN    Culture      NO GROWTH 2 DAYS Performed at [facility], [address] [place], [place] [address]    Report Status PENDING   Acid Fast Smear (AFB)      Status: None   Collection Time: 03/10/20 12:42 PM   Specimen: Peritoneal  Result Value Ref Range   AFB Specimen Processing Concentration    Acid Fast Smear Negative     Comment: (NOTE) Performed At: [facility] [place], [place] [id number] Rush Farmer MD QH:[id number]    Source (AFB) PERITONEAL     Comment: FLUID Performed at [facility], [place] [address], [place], [place] [address]   CBC     Status: Abnormal   Collection Time: 03/11/20  2:48 AM  Result Value Ref Range   WBC 12.7 (H) 4.0 - 10.5 K/uL   RBC 4.15 (L) 4.22 - 5.81 MIL/uL   Hemoglobin 13.3 13.0 - 17.0 g/dL   HCT 39.2 39 - 52 %   MCV 94.5 80.0 - 100.0 fL   MCH 32.0 26.0 - 34.0 pg   MCHC 33.9 30.0 - 36.0 g/dL   RDW 13.8 11.5 - 15.5 %   Platelets 358 150 - 400 K/uL   nRBC 0.0 0.0 - 0.2 %    Comment: Performed at [facility], [place] [address] [place], [place] [address]  Basic metabolic panel     Status: Abnormal   Collection Time: 03/11/20  2:48 AM  Result Value Ref Range  Sodium 126 (L) 135 - 145 mmol/L   Potassium 4.0 3.5 - 5.1 mmol/L   Chloride 93 (L) 98 - 111 mmol/L   CO2 21 (L) 22 - 32 mmol/L   Glucose, Bld 105 (H) 70 - 99 mg/dL    Comment: Glucose reference range applies only to samples taken after fasting for at least 8 hours.   BUN 8 6 - 20 mg/dL   Creatinine, Ser 0.89 0.61 - 1.24 mg/dL   Calcium 8.6 (L) 8.9 - 10.3 mg/dL   GFR calc non Af Amer >60 >60 mL/min   GFR calc Af Amer >60 >60 mL/min   Anion gap 12 5 - 15    Comment: Performed at [facility] [address], [place], [place] [address]  Hepatic function panel     Status: Abnormal   Collection Time: 03/11/20  2:48 AM  Result Value Ref Range   Total Protein 7.4 6.5 - 8.1 g/dL   Albumin 2.6 (L) 3.5 - 5.0 g/dL   AST 30 15 - 41 U/L   ALT 20 0 - 44 U/L   Alkaline Phosphatase 139 (H) 38 - 126 U/L   Total Bilirubin 2.3 (H) 0.3 - 1.2 mg/dL   Bilirubin, Direct 0.9 (H) 0.0 - 0.2 mg/dL   Indirect  Bilirubin 1.4 (H) 0.3 - 0.9 mg/dL    Comment: Performed at [facility] [address], [place], [place] [address]  Sodium, urine, random     Status: None   Collection Time: 03/11/20 12:57 PM  Result Value Ref Range   Sodium, Ur 53 mmol/L    Comment: Performed at [facility] [address], [place], [place] [address]  Osmolality, urine     Status: Abnormal   Collection Time: 03/11/20 12:57 PM  Result Value Ref Range   Osmolality, Ur 239 (L) 300 - 900 mOsm/kg    Comment: Performed at [facility] [address] [place], [place] [address]    Radiology/Results: DG ABD ACUTE 2+V W 1V CHEST  Result Date: 03/10/2020 CLINICAL DATA:  Abdominal pain for 2 weeks, diarrhea, nausea and weakness for 4 days EXAM: DG ABDOMEN ACUTE W/ 1V CHEST COMPARISON:  CT 03/08/2020 FINDINGS: Few bandlike opacities in the left lung base likely reflecting subsegmental atelectatic change. No consolidation, features of edema, pneumothorax, or effusion. The aorta is calcified. The remaining cardiomediastinal contours are unremarkable. No subdiaphragmatic free air. Multiple clustered loops of borderline dilated small bowel are present within the mid abdomen with mild thickening of the small bowel folds in slight separation which could reflect mural edematous changes. Mild air distention of the colon is noted as well. Cholecystectomy clips and a biliary stent noted in the right upper quadrant. Multilevel degenerative changes in the shoulders, spine, hips and pelvis. Stable bony excrescence along the superolateral acetabular margin on the right is similar to prior. IMPRESSION: Findings most suggestive of developing ileus versus obstruction with features of mild bowel edema which is fairly nonspecific given patient's recent ascites. Prior cholecystectomy and biliary stenting. Electronically Signed   By: Lovena Le M.D.   On: 03/10/2020 17:29   IR Paracentesis  Result Date: 03/10/2020 INDICATION: Abdominal  distention. History of spontaneous bacterial peritonitis. Recurrent ascites. Request for repeat diagnostic and therapeutic paracentesis. EXAM: ULTRASOUND GUIDED RIGHT LOWER QUADRANT PARACENTESIS MEDICATIONS: None. COMPLICATIONS: None immediate. PROCEDURE: Informed written consent was obtained from the patient after a discussion of the risks, benefits and alternatives to treatment. A timeout was performed prior  to the initiation of the procedure. Initial ultrasound scanning demonstrates a small amount of ascites within the right lower abdominal quadrant. The right lower abdomen was prepped and draped in the usual sterile fashion. 1% lidocaine was used for local anesthesia. Following this, a 19 gauge, 7-cm, Yueh catheter was introduced. An ultrasound image was saved for documentation purposes. The paracentesis was performed. The catheter was removed and a dressing was applied. The patient tolerated the procedure well without immediate post procedural complication. FINDINGS: A total of approximately 600 mL of hazy, amber colored fluid was removed. Samples were sent to the laboratory as requested by the clinical team. IMPRESSION: Successful ultrasound-guided paracentesis yielding 600 mL of peritoneal fluid. Read by: Ascencion Dike PA-C Electronically Signed   By: Lucrezia Europe M.D.   On: 03/10/2020 12:47    Anti-infectives: Anti-infectives (From admission, onward)   Start     Dose/Rate Route Frequency Ordered Stop   03/10/20 1600  meropenem (MERREM) 1 g in sodium chloride 0.9 % 100 mL IVPB     Discontinue     1 g 200 mL/hr over 30 Minutes Intravenous Every 8 hours 03/10/20 1538     03/09/20 1030  sulfamethoxazole-trimethoprim (BACTRIM DS) 800-160 MG per tablet 1 tablet  Status:  Discontinued        1 tablet Oral Every 12 hours 03/09/20 0939 03/10/20 1530   03/09/20 1030  amoxicillin-clavulanate (AUGMENTIN) 875-125 MG per tablet 1 tablet  Status:  Discontinued        1 tablet Oral Every 12 hours 03/09/20 0939  03/10/20 1530   03/09/20 0000  amoxicillin-clavulanate (AUGMENTIN) 875-125 MG tablet     Discontinue     1 tablet Oral 2 times daily 03/09/20 0949 04/08/20 2359   03/09/20 0000  sulfamethoxazole-trimethoprim (BACTRIM DS) 800-160 MG tablet     Discontinue     1 tablet Oral 2 times daily 03/09/20 0949 04/08/20 2359   03/03/20 2200  cefTRIAXone (ROCEPHIN) 2 g in sodium chloride 0.9 % 100 mL IVPB  Status:  Discontinued        2 g 200 mL/hr over 30 Minutes Intravenous Every 24 hours 03/03/20 1010 03/09/20 0939   03/03/20 1200  metroNIDAZOLE (FLAGYL) IVPB 500 mg  Status:  Discontinued        500 mg 100 mL/hr over 60 Minutes Intravenous Every 8 hours 03/03/20 1010 03/09/20 0939   03/03/20 1000  amoxicillin-clavulanate (AUGMENTIN) 875-125 MG per tablet 1 tablet  Status:  Discontinued        1 tablet Oral Every 12 hours 03/03/20 0735 03/03/20 1010   02/25/20 2000  cefTRIAXone (ROCEPHIN) 2 g in sodium chloride 0.9 % 100 mL IVPB  Status:  Discontinued        2 g 200 mL/hr over 30 Minutes Intravenous Every 24 hours 02/24/20 1847 03/03/20 0735   02/25/20 1515  metroNIDAZOLE (FLAGYL) IVPB 500 mg  Status:  Discontinued        500 mg 100 mL/hr over 60 Minutes Intravenous Every 8 hours 02/25/20 1455 03/03/20 0735   02/24/20 1830  cefTRIAXone (ROCEPHIN) 2 g in sodium chloride 0.9 % 100 mL IVPB  Status:  Discontinued        2 g 200 mL/hr over 30 Minutes Intravenous Every 24 hours 02/24/20 1741 02/24/20 1847   02/24/20 1200  piperacillin-tazobactam (ZOSYN) IVPB 3.375 g  Status:  Discontinued        3.375 g 100 mL/hr over 30 Minutes Intravenous Every 6 hours 02/24/20 0934 02/24/20 1009  02/24/20 1200  piperacillin-tazobactam (ZOSYN) IVPB 3.375 g  Status:  Discontinued        3.375 g 12.5 mL/hr over 240 Minutes Intravenous Every 8 hours 02/24/20 1009 02/24/20 1741   02/24/20 0245  piperacillin-tazobactam (ZOSYN) IVPB 3.375 g        3.375 g 100 mL/hr over 30 Minutes Intravenous  Once 02/24/20 0237 02/24/20  0535      Assessment/Plan: Problem List: Patient Active Problem List   Diagnosis Date Noted  . Sepsis following intra-abdominal surgery ([place]) 02/24/2020  . Alcohol dependence with uncomplicated withdrawal ([place]) 02/24/2020  . Abdominal pain 12/24/2019  . Essential hypertension 12/15/2019  . Bile leak, postoperative 11/28/2019  . Acute cholecystitis without calculus 11/21/2019  . Protein-calorie malnutrition ([place]) 04/11/2019  . Hyperammonemia ([place]) 04/09/2019  . Elevated LFTs   . Gynecomastia, male   . Hyperbilirubinemia   . Tremor of unknown origin   . COVID-19 virus infection 11/27/2018  . Acute respiratory disease due to COVID-19 virus 11/27/2018  . SIRS (systemic inflammatory response syndrome) ([place]) 11/25/2018  . Acute on chronic respiratory failure with hypoxia ([place]) 11/21/2018  . Suspected COVID-19 virus infection 11/17/2018    Post paracentesis yesterday.  No surgical options to pursue at this time.   17 Days Post-Op    LOS: 17 days   Matt B. Hassell Done, MD, [facility] [phone] to reach the surgeon on call.    03/12/2020 10:48 AM

## 2020-03-13 ENCOUNTER — Inpatient Hospital Stay (HOSPITAL_COMMUNITY): Payer: Self-pay

## 2020-03-13 LAB — BASIC METABOLIC PANEL
Anion gap: 10 (ref 5–15)
BUN: 10 mg/dL (ref 6–20)
CO2: 27 mmol/L (ref 22–32)
Calcium: 9 mg/dL (ref 8.9–10.3)
Chloride: 94 mmol/L — ABNORMAL LOW (ref 98–111)
Creatinine, Ser: 1 mg/dL (ref 0.61–1.24)
GFR calc Af Amer: >60 mL/min (ref >60)
GFR calc non Af Amer: >60 mL/min (ref >60)
Glucose, Bld: 118 mg/dL — ABNORMAL HIGH (ref 70–99)
Potassium: 4.7 mmol/L (ref 3.5–5.1)
Sodium: 131 mmol/L — ABNORMAL LOW (ref 135–145)

## 2020-03-13 LAB — CBC WITH DIFFERENTIAL/PLATELET
Abs Immature Granulocytes: 0.02 10*3/uL (ref 0.00–0.07)
Basophils Absolute: 0.1 10*3/uL (ref 0.0–0.1)
Basophils Relative: 1 %
Eosinophils Absolute: 0.1 10*3/uL (ref 0.0–0.5)
Eosinophils Relative: 1 %
HCT: 40.7 % (ref 39.0–52.0)
Hemoglobin: 13.8 g/dL (ref 13.0–17.0)
Immature Granulocytes: 0 %
Lymphocytes Relative: 18 %
Lymphs Abs: 1.8 10*3/uL (ref 0.7–4.0)
MCH: 32.3 pg (ref 26.0–34.0)
MCHC: 33.9 g/dL (ref 30.0–36.0)
MCV: 95.3 fL (ref 80.0–100.0)
Monocytes Absolute: 1.1 10*3/uL — ABNORMAL HIGH (ref 0.1–1.0)
Monocytes Relative: 11 %
Neutro Abs: 6.9 10*3/uL (ref 1.7–7.7)
Neutrophils Relative %: 69 %
Platelets: 408 10*3/uL — ABNORMAL HIGH (ref 150–400)
RBC: 4.27 MIL/uL (ref 4.22–5.81)
RDW: 13.3 % (ref 11.5–15.5)
WBC: 10 10*3/uL (ref 4.0–10.5)
nRBC: 0 % (ref 0.0–0.2)

## 2020-03-13 NOTE — Progress Notes (Signed)
Pharmacy Antibiotic Note  Micheal Watson is a 59 y.o. male with recent cholecystitis/cholangitis status post CBD stent and JP drain, admitted on 02/23/2020 with sepsis/bacteremia 2/2 spontaneous bacterial peritonitis. CT showing several persistent hepatic abscesses. Pharmacy was consulted for meropenem dosing. Antibiotics changed 8/6 due to low-grade fevers and WBCs in peritoneal fluid trended up.   Renal function stable, Meropenem regimen remains appropriate.  To IR for paracentesis today but only trace peritoneal fluid noted so procedure not done. Last paracentesis 8/6. Afebrile, WBC trended down.  Plan:  Continue Meropenem 1gm IV q8h.  Follow renal function, culture data, IV antibitoc plans.  Noted plan for Augmentin + Bactrim x 30 days at discharge.  Height: 5\' 9"  (175.3 cm) Weight: 91.8 kg (202 lb 6.1 oz) IBW/kg (Calculated) : 70.7  Temp (24hrs), Avg:98.3 F (36.8 C), Min:98.2 F (36.8 C), Max:98.4 F (36.9 C)  Recent Labs  Lab 03/09/20 0708 03/10/20 0823 03/11/20 0248 03/12/20 0941 03/13/20 0653 03/13/20 1022  WBC 9.8 11.4* 12.7*  --   --  10.0  CREATININE 0.84 0.80 0.89 0.90 1.00  --     Estimated Creatinine Clearance: 89 mL/min (by C-G formula based on SCr of 1 mg/dL).    Allergies  Allergen Reactions  . Pork-Derived Products Other (See Comments)    "I have pain in my stomach if I eat this"    Antimicrobials this admission: Ceftiraxone 7/22 >> 8/5 Metronidazole 7/23 >>8/5 Augmentin 8/5>> 8/6 Bactrim 8/5>>8/6 Meropenem 8/6>   Microbiology results: 7/22 blood: Proteus penneri, E.coli, Klebsiella. All sens to ceftriaxone 7/23 peritoneal cx: negative, PMN <250, prot >1 8/2 peritoneal cx: negative; PMN <250 8/6 peritoneal cx: ng x 3 days to date; increasing PMNs  Thank you for allowing pharmacy to be a part of this patient's care.  Arty Baumgartner,  Phone: [phone] 03/13/2020 4:14 PM

## 2020-03-13 NOTE — Progress Notes (Signed)
[facility] Progress Note  Micheal Watson 59 y.o. [birth year]  CC: Abdominal pain, bacteremia, SBP   Subjective:  Interpreter software used for history taking.  Resting comfortably in the bed.  Denies abdominal pain, nausea or vomiting.  Denies diarrhea or constipation.   ROS : Afebrile, negative for chest pain   Objective: Vital signs in last 24 hours: Vitals:   03/12/20 2004 03/13/20 0413  BP: 122/84 125/85  Pulse: 79 77  Resp: 17 18  Temp: 98.3 F (36.8 C) 98.2 F (36.8 C)  SpO2: 97% 99%    Physical Exam:  General:  Alert, cooperative, no distress, appears stated age  Head:  Normocephalic, without obvious abnormality, atraumatic  Eyes:  , EOM's intact,   Lungs:    No visible respiratory distress noted  Heart:  Regular rate and rhythm, S1, S2 normal  Abdomen:    Abdomen is soft, nontender, nondistended, bowel sounds present.  No peritoneal signs  Extremities: Extremities normal, atraumatic, no  edema       Lab Results: Recent Labs    03/12/20 0941 03/13/20 0653  NA 130* 131*  K 4.1 4.7  CL 95* 94*  CO2 23 27  GLUCOSE 156* 118*  BUN 11 10  CREATININE 0.90 1.00  CALCIUM 9.0 9.0   Recent Labs    03/11/20 0248  AST 30  ALT 20  ALKPHOS 139*  BILITOT 2.3*  PROT 7.4  ALBUMIN 2.6*   Recent Labs    03/11/20 0248  WBC 12.7*  HGB 13.3  HCT 39.2  MCV 94.5  PLT 358   No results for input(s): LABPROT, INR in the last 72 hours.    Assessment/Plan: -Elevated LFTs.  Improving.  Multifactorial from cirrhosis along with alcohol use and history of biliary stricture. -Polymicrobial bacteremia along with SBP. -History of laparoscopic cholecystectomy with postoperative bile leak in April 2021.  Follow-up ERCP with stent placement on November 29, 2019.  Had diagnostic laparoscopy in May 2021.  Another ERCP on February 24, 2020-previously placed stent was partially occluded which was removed.   Another 10 French 7 cm plastic stent was placed.  Cytology  showed atypical cells.  Recommendations ----------------------- -Repeat paracentesis per Dr. Erlinda Hong request to document resolution of SBP prior to proceeding with EUS and ERCP/cholangioscopy for evaluation of biliary stricture -Plan for EUS and ERCP tomorrow if repeat paracentesis negative for SBP. -Continue current management. -Continue antibiotics -GI will follow   Otis Brace MD, FACP 03/13/2020, 8:48 AM  Contact #  [phone]

## 2020-03-13 NOTE — Progress Notes (Signed)
Patient ID: Micheal Watson, male   DOB: Dec 31, 1960, 59 y.o.   MRN: [id number] No surgery indicated, this is separate from his prior lap chole and is related to his cirrhosis.  Please call back if needed

## 2020-03-13 NOTE — Progress Notes (Signed)
PROGRESS NOTE    Loris Winrow  ZSW:[id number] DOB: 05/25/1961 DOA: 02/23/2020 PCP: Patient, No Pcp Per   Brief Narrative: Micheal Watson is a 59 y.o. male with a history of ethanol abuse, recent cholecystitis/cholangitis status post CBD stent and JP drain placement.  Patient presented secondary to worsening abdominal pain, nausea, fever.  Patient found to have evidence of sepsis on admission with concern for SBP and found to have associated bacteremia and hepatic abscess.  Also noted to have biliary stricture which was managed by GI with placement of the stent.  Currently on IV antibiotics for the treatment of SBP and bacteremia with concern of possible worsening liver abscess.    Assessment & Plan:   Principal Problem:   Sepsis following intra-abdominal surgery ([place]) Active Problems:   Hyperbilirubinemia   Essential hypertension   Alcohol dependence with uncomplicated withdrawal ([place])   Sepsis Present on admission.  Concern for possible SBP confirmed by cell count from paracentesis.  Patient was initially managed on Zosyn which was transitioned to ceftriaxone and Flagyl IV. Patient is not on Bactrim and Augmentin.  SBP In setting of recent abdominal surgery (laparoscopic cholecystectomy on 4/19) with associated obstructed CBD stent.  Patient underwent multiple paracenteses with fluid analysis consistent with SBP.  Previous body fluid cultures appear to have been canceled.  Only most recent fluid culture from 8/2 is resulting in no growth to date.  ERCP performed on 7/22 by GI with stent removal and replacement.  Patient has been on ceftriaxone and Flagyl IV for treatment of SBP.  CT scan on 8/4 significant for changes consistent with peritonitis. Patient initially with down trended white blood cell count which has significantly increased in most recent paracentesis (8/6). Meropenem started on 8/6 -Infectious disease recommendations: meropenem -General surgery  recommendations: no surgical options at this time -GI recommendations: repeat paracentesis today  Hepatic abscess Seen on recent CT scan from 7/26.  Patient still has discontinued right upper quadrant abdominal pain and spiked spiked fever 100.4 F overnight.  Leukocytosis is persistent but mild.  CT abdomen shows improving abscesses.  Biliary stricture Patient underwent ERCP as mentioned above.  Stent placement mentioned above.  Hyponatremia Secondary to alcohol use.  Mostly mild. In setting of hepatic disease. Asymptomatic. Stable.  Polymicrobial bacteremia In setting of SBP.  Blood cultures significant for Proteus penneri, E. coli, Klebsiella pneumonia.  Patient is on antibiotics as mentioned above.  Alcohol abuse No current withdrawal symptoms.  Rectal pain Nothing seen on physical exam. Appears to have improved with Anusol   DVT prophylaxis: Lovenox Code Status:   Code Status: Full Code Family Communication: None at bedside Disposition Plan: Discharge home in several days pending significant improvement of SBP as there is a concern for high morbidity if patient discharges and attempts to manage as an outpatient.   Consultants:   Gastroenterology  Infectious disease  Interventional radiology  Procedures:   ERCP (7/22) Impression:               - The upper third of the main bile duct and common                            hepatic duct were moderately dilated, secondary to                            a stricture.                           -  One stent was removed from the biliary tree.                           - Cells for cytology obtained in the upper third of                            the main bile duct.                           - One temporary stent was placed into the common                            bile duct.  Recommendation:           - Avoid aspirin and nonsteroidal anti-inflammatory                            medicines for 3 days.                            - Watch for pancreatitis, bleeding, perforation,                            and cholangitis.                           - Observe patient's clinical course.                           - Continue antibiotics.                           - May need paracentesis, pending clinical course.                           - May need EUS/cholangioscopy, [place],                            pending clinical course.                           - Alcohol cessation.                           - Clear liquid diet today.                           [name] will follow.   PARACENTESIS (7/23, 7/26, 8/2, 8/6)  Antimicrobials:  Zosyn IV  Ceftriaxone IV  Metronidazole IV  Bactrim PO  Augmentin PO  Meropenem IV   Subjective: Interpreter: Enzo Montgomery #[id number]  No abdominal pain Rectal pain has improved.  Objective: Vitals:   03/12/20 0409 03/12/20 1517 03/12/20 2004 03/13/20 0413  BP: 106/81 111/71 122/84 125/85  Pulse: 81 79 79 77  Resp: 19 18 17 18   Temp: 99.1 F (37.3 C) 98.3 F (36.8 C) 98.3 F (36.8 C) 98.2 F (36.8 C)  TempSrc: Oral Oral Oral Oral  SpO2: 93% 95% 97% 99%  Weight:  Height:        Intake/Output Summary (Last 24 hours) at 03/13/2020 1017 Last data filed at 03/13/2020 0750 Gross per 24 hour  Intake 1840 ml  Output 900 ml  Net 940 ml   Filed Weights   02/27/20 0351 03/03/20 0439  Weight: (!) 97 kg 91.8 kg    Examination:  General exam: Appears calm and comfortable  Respiratory system: Clear to auscultation. Respiratory effort normal. Cardiovascular system: S1 & S2 heard, RRR. No murmurs, rubs, gallops or clicks. Gastrointestinal system: Abdomen is slightly distended, soft and nontender. No organomegaly or masses felt. Normal bowel sounds heard. Central nervous system: Alert and oriented. No focal neurological deficits. Musculoskeletal: No edema. No calf tenderness Skin: No cyanosis. No rashes Psychiatry: Judgement and insight appear normal. Mood & affect  appropriate.    Data Reviewed: I have personally reviewed following labs and imaging studies  CBC Lab Results  Component Value Date   WBC 12.7 (H) 03/11/2020   RBC 4.15 (L) 03/11/2020   HGB 13.3 03/11/2020   HCT 39.2 03/11/2020   MCV 94.5 03/11/2020   MCH 32.0 03/11/2020   PLT 358 03/11/2020   MCHC 33.9 03/11/2020   RDW 13.8 03/11/2020   LYMPHSABS 2.3 03/02/2020   MONOABS 1.1 (H) 03/02/2020   EOSABS 0.1 03/02/2020   BASOSABS 0.1 22/09/5425     Last metabolic panel Lab Results  Component Value Date   NA 131 (L) 03/13/2020   K 4.7 03/13/2020   CL 94 (L) 03/13/2020   CO2 27 03/13/2020   BUN 10 03/13/2020   CREATININE 1.00 03/13/2020   GLUCOSE 118 (H) 03/13/2020   GFRNONAA >60 03/13/2020   GFRAA >60 03/13/2020   CALCIUM 9.0 03/13/2020   PHOS 3.3 03/07/2020   PROT 7.4 03/11/2020   ALBUMIN 2.6 (L) 03/11/2020   BILITOT 2.3 (H) 03/11/2020   ALKPHOS 139 (H) 03/11/2020   AST 30 03/11/2020   ALT 20 03/11/2020   ANIONGAP 10 03/13/2020    CBG (last 3)  No results for input(s): GLUCAP in the last 72 hours.   GFR: Estimated Creatinine Clearance: 89 mL/min (by C-G formula based on SCr of 1 mg/dL).  Coagulation Profile: No results for input(s): INR, PROTIME in the last 168 hours.  Recent Results (from the past 240 hour(s))  Gram stain     Status: None   Collection Time: 03/06/20 10:05 AM   Specimen: Abdomen; Peritoneal Fluid  Result Value Ref Range Status   Specimen Description PERITONEAL FLUID  Final   Special Requests ABDOMEN  Final   Gram Stain   Final    CYTOSPIN SMEAR WBC PRESENT,BOTH PMN AND MONONUCLEAR NO ORGANISMS SEEN Performed at [facility], [address], [place], [place] [address]    Report Status 03/08/2020 FINAL  Final  Culture, body fluid-bottle     Status: None   Collection Time: 03/06/20 10:05 AM   Specimen: Peritoneal Washings  Result Value Ref Range Status   Specimen Description PERITONEAL FLUID  Final   Special Requests ABDOMEN   Final   Culture   Final    NO GROWTH 5 DAYS Performed at [facility] [address] [place], [place] [address]    Report Status 03/11/2020 FINAL  Final  Body fluid culture     Status: None (Preliminary result)   Collection Time: 03/10/20 12:42 PM   Specimen: Body Fluid  Result Value Ref Range Status   Specimen Description FLUID PERITONEAL  Final   Special Requests NONE  Final   Gram Stain   Final    ABUNDANT WBC PRESENT,BOTH PMN AND MONONUCLEAR NO ORGANISMS SEEN    Culture   Final    NO GROWTH 3 DAYS Performed at [facility], [address], [place], [place] [address]    Report Status PENDING  Incomplete  Acid Fast Smear (AFB)     Status: None   Collection Time: 03/10/20 12:42 PM   Specimen: Peritoneal  Result Value Ref Range Status   AFB Specimen Processing Concentration  Final   Acid Fast Smear Negative  Final    Comment: (NOTE) Performed At: [facility] [place], [place] [id number] Rush Farmer MD [id number]    Source (AFB) PERITONEAL  Final    Comment: FLUID Performed at [facility], [place] [address], [place],  [address]         Radiology Studies: No results found.      Scheduled Meds: . enoxaparin (LOVENOX) injection  45 mg Subcutaneous Q24H  . folic acid  1 mg Oral Daily  . furosemide  40 mg Oral Daily  . Gerhardt's butt cream   Topical TID  . hydrocortisone  25 mg Rectal BID  . multivitamin with minerals  1 tablet Oral Daily  . pantoprazole  40 mg Oral Daily  . sodium chloride flush  3 mL Intravenous Q12H  . spironolactone  50 mg Oral Daily  . thiamine  100 mg Oral Daily   Continuous Infusions: . sodium chloride Stopped (03/06/20 0419)  . albumin human    . meropenem (MERREM) IV 1 g (03/13/20 0516)     LOS: 18 days     Cordelia Poche, MD [facility] 03/13/2020, 10:17 AM  If 7PM-7AM, please contact night-coverage [email or web address]

## 2020-03-13 NOTE — Progress Notes (Signed)
Patient presents for diagnostic paracentesis. [place] limited abdomen shows trace amount of peritoneal fluid noted  Insufficient to perform a safe paracentesis. Procedure not performed.

## 2020-03-14 DIAGNOSIS — F1020 Alcohol dependence, uncomplicated: Secondary | ICD-10-CM

## 2020-03-14 DIAGNOSIS — K651 Peritoneal abscess: Secondary | ICD-10-CM

## 2020-03-14 LAB — COMPREHENSIVE METABOLIC PANEL
ALT: 31 U/L (ref 0–44)
AST: 43 U/L — ABNORMAL HIGH (ref 15–41)
Albumin: 2.8 g/dL — ABNORMAL LOW (ref 3.5–5.0)
Alkaline Phosphatase: 141 U/L — ABNORMAL HIGH (ref 38–126)
Anion gap: 12 (ref 5–15)
BUN: 10 mg/dL (ref 6–20)
CO2: 25 mmol/L (ref 22–32)
Calcium: 9 mg/dL (ref 8.9–10.3)
Chloride: 94 mmol/L — ABNORMAL LOW (ref 98–111)
Creatinine, Ser: 0.67 mg/dL (ref 0.61–1.24)
GFR calc Af Amer: >60 mL/min (ref >60)
GFR calc non Af Amer: >60 mL/min (ref >60)
Glucose, Bld: 112 mg/dL — ABNORMAL HIGH (ref 70–99)
Potassium: 4.3 mmol/L (ref 3.5–5.1)
Sodium: 131 mmol/L — ABNORMAL LOW (ref 135–145)
Total Bilirubin: 1.5 mg/dL — ABNORMAL HIGH (ref 0.3–1.2)
Total Protein: 7.2 g/dL (ref 6.5–8.1)

## 2020-03-14 LAB — BODY FLUID CULTURE: Culture: NO GROWTH

## 2020-03-14 LAB — PATHOLOGIST SMEAR REVIEW

## 2020-03-14 MED ORDER — ENOXAPARIN SODIUM 40 MG/0.4ML ~~LOC~~ SOLN
40.0000 mg | SUBCUTANEOUS | Status: AC
  Administered 2020-03-14 – 2020-03-15 (×2): 40 mg via SUBCUTANEOUS
  Filled 2020-03-14 (×2): qty 0.4

## 2020-03-14 NOTE — Progress Notes (Signed)
[facility]  Date of Admission:  02/23/2020      Total days of antibiotics 19   Merrem day 1            ASSESSMENT: Micheal Watson is a 59 y.o. male with cirrhosis of the liver complicated by bacterial peritonitis, liver abscesses with blood cultures recently + for e coli, kleb pneumoniae, and proteus penneri. Concern for increasing total WBC and refractory peritonitis; his exam on belly is benign aside from RUQ tenderness. Concerned that this is secondary to uncontrolled biliary source of infection - will continue Merrem for now pending ERCP later this week. Nothing growing on cultures from recent peritoneal aspirates.     PLAN: 1. Continue Merrem  2. Follow ERCP Thursday    Principal Problem:   Sepsis following intra-abdominal surgery [facility]) Active Problems:   Hyperbilirubinemia   Essential hypertension   Alcohol dependence with uncomplicated withdrawal ([facility])    enoxaparin (LOVENOX) injection  40 mg Subcutaneous X32T   folic acid  1 mg Oral Daily   furosemide  40 mg Oral Daily   Gerhardt's butt cream   Topical TID   hydrocortisone  25 mg Rectal BID   multivitamin with minerals  1 tablet Oral Daily   pantoprazole  40 mg Oral Daily   sodium chloride flush  3 mL Intravenous Q12H   spironolactone  50 mg Oral Daily   thiamine  100 mg Oral Daily    SUBJECTIVE: Spanish interpretor used. Called back to evaluate his increased WBC count on recent peritoneal aspirate.   He states that his abdominal pain has improved. Does notice some difficulty with passing BMs and seems constipated. He does have some significant tenderness to the RUQ when others palpate his stomach. No prandial pain.  No fevers/chills.  Tolerating abx well.    Review of Systems: Review of Systems  Constitutional: Negative for chills and fever.  Respiratory: Negative for cough.   Cardiovascular: Negative for chest pain.  Gastrointestinal: Positive  for abdominal pain and constipation. Negative for nausea and vomiting.  Skin: Negative for rash.    Allergies  Allergen Reactions   Pork-Derived Products Other (See Comments)    "I have pain in my stomach if I eat this"    OBJECTIVE: Vitals:   03/13/20 1559 03/13/20 2058 03/14/20 0605 03/14/20 1407  BP: 135/86 127/88 133/82 115/82  Pulse: 77 77 84 87  Resp: 18 20 18 18   Temp: 98.4 F (36.9 C) 100 F (37.8 C) 98.4 F (36.9 C) 99.3 F (37.4 C)  TempSrc: Oral Oral  Oral  SpO2: 98% 97% 96% 97%  Weight:      Height:       Body mass index is 29.89 kg/m.  Physical Exam Vitals reviewed.  Constitutional:      Appearance: Normal appearance. He is not ill-appearing.     Comments: Resting comfortably in bed  HENT:     Head: Normocephalic.     Mouth/Throat:     Mouth: Mucous membranes are moist.     Pharynx: Oropharynx is clear.  Eyes:     General: No scleral icterus. Pulmonary:     Effort: Pulmonary effort is normal.  Abdominal:     Tenderness: There is abdominal tenderness in the right upper quadrant.  Musculoskeletal:        General: Normal range of motion.     Cervical back: Normal range of motion.  Skin:    Coloration: Skin  is not jaundiced or pale.  Neurological:     Mental Status: He is alert and oriented to person, place, and time.  Psychiatric:        Mood and Affect: Mood normal.        Judgment: Judgment normal.     Lab Results Lab Results  Component Value Date   WBC 10.0 03/13/2020   HGB 13.8 03/13/2020   HCT 40.7 03/13/2020   MCV 95.3 03/13/2020   PLT 408 (H) 03/13/2020    Lab Results  Component Value Date   CREATININE 0.67 03/14/2020   BUN 10 03/14/2020   NA 131 (L) 03/14/2020   K 4.3 03/14/2020   CL 94 (L) 03/14/2020   CO2 25 03/14/2020    Lab Results  Component Value Date   ALT 31 03/14/2020   AST 43 (H) 03/14/2020   GGT 2,189 (H) 04/10/2019   ALKPHOS 141 (H) 03/14/2020   BILITOT 1.5 (H) 03/14/2020     Microbiology: Recent  Results (from the past 240 hour(s))  Gram stain     Status: None   Collection Time: 03/06/20 10:05 AM   Specimen: Abdomen; Peritoneal Fluid  Result Value Ref Range Status   Specimen Description PERITONEAL FLUID  Final   Special Requests ABDOMEN  Final   Gram Stain   Final    CYTOSPIN SMEAR WBC PRESENT,BOTH PMN AND MONONUCLEAR NO ORGANISMS SEEN Performed at [facility], [address], [place], [place] [address]    Report Status 03/08/2020 FINAL  Final  Culture, body fluid-bottle     Status: None   Collection Time: 03/06/20 10:05 AM   Specimen: Peritoneal Washings  Result Value Ref Range Status   Specimen Description PERITONEAL FLUID  Final   Special Requests ABDOMEN  Final   Culture   Final    NO GROWTH 5 DAYS Performed at [facility] [address], [place], [place] [address]    Report Status 03/11/2020 FINAL  Final  Body fluid culture     Status: None   Collection Time: 03/10/20 12:42 PM   Specimen: Body Fluid  Result Value Ref Range Status   Specimen Description FLUID PERITONEAL  Final   Special Requests NONE  Final   Gram Stain   Final    ABUNDANT WBC PRESENT,BOTH PMN AND MONONUCLEAR NO ORGANISMS SEEN    Culture   Final    NO GROWTH 3 DAYS Performed at [facility], [address], [place], [place] [address]    Report Status 03/14/2020 FINAL  Final  Acid Fast Smear (AFB)     Status: None   Collection Time: 03/10/20 12:42 PM   Specimen: Peritoneal  Result Value Ref Range Status   AFB Specimen Processing Concentration  Final   Acid Fast Smear Negative  Final    Comment: (NOTE) Performed At: [facility] [place], [place] [id number] Rush Farmer MD [id number]    Source (AFB) PERITONEAL  Final    Comment: FLUID Performed at [facility], [place] [address] [place], [place] [address]     Janene Madeira, MSN, NP-C [facility] [email or web address] Pager: [phone] Office: [phone] [place]: [phone]

## 2020-03-14 NOTE — Progress Notes (Signed)
PROGRESS NOTE    Micheal Watson  GYK:[id number] DOB: 09/25/1960 DOA: 02/23/2020 PCP: Patient, No Pcp Per   Brief Narrative: Micheal Watson is a 59 y.o. male with a history of ethanol abuse, recent cholecystitis/cholangitis status post CBD stent and JP drain placement.  Patient presented secondary to worsening abdominal pain, nausea, fever.  Patient found to have evidence of sepsis on admission with concern for SBP and found to have associated bacteremia and hepatic abscess.  Also noted to have biliary stricture which was managed by GI with placement of the stent.  Currently on IV antibiotics for the treatment of SBP and bacteremia with concern of possible worsening liver abscess.    Assessment & Plan:   Principal Problem:   Sepsis following intra-abdominal surgery ([place]) Active Problems:   Hyperbilirubinemia   Essential hypertension   Alcohol dependence with uncomplicated withdrawal ([place])   Sepsis Present on admission.  Concern for possible SBP confirmed by cell count from paracentesis.  Patient was initially managed on Zosyn which was transitioned to ceftriaxone and Flagyl IV. Patient was transitioned to Bactrim and Augmentin which was then escalated to meropenem.  SBP In setting of recent abdominal surgery (laparoscopic cholecystectomy on 4/19) with associated obstructed CBD stent.  Patient underwent multiple paracenteses with fluid analysis consistent with SBP.  Previous body fluid cultures appear to have been canceled.  Only most recent fluid culture from 8/2 is resulting in no growth to date.  ERCP performed on 7/22 by GI with stent removal and replacement.  Patient has been on ceftriaxone and Flagyl IV for treatment of SBP.  CT scan on 8/4 significant for changes consistent with peritonitis. Patient initially with down trended white blood cell count which has significantly increased in most recent paracentesis (8/6). Meropenem started on 8/6. Paracentesis attempted on 8/9  without enough ascites on ultrasound. -Infectious disease recommendations: meropenem; consulted today for further recommendations -General surgery recommendations: no surgical options at this time; signed off -GI recommendations: tentative plan for EUS and ERCP/cholangioscopy on 8/12  Hepatic abscess Seen on recent CT scan from 7/26.  Patient still has discontinued right upper quadrant abdominal pain and spiked spiked fever 100.4 F overnight on 8/3. Tmax of 100 F in last 24 hours.  Leukocytosis is persisted but mild and now improved.  CT abdomen shows improving abscesses.  Biliary stricture Patient underwent ERCP as mentioned above.  Stent placement mentioned above.  Hyponatremia Secondary to alcohol use.  Mostly mild. In setting of hepatic disease. Asymptomatic. Stable.  Polymicrobial bacteremia In setting of SBP.  Blood cultures significant for Proteus penneri, E. coli, Klebsiella pneumonia.  Patient is on antibiotics as mentioned above.  Alcohol abuse No current withdrawal symptoms.  Rectal pain Nothing seen on physical exam. Appears to have improved with Anusol   DVT prophylaxis: Lovenox Code Status:   Code Status: Full Code Family Communication: None at bedside Disposition Plan: Discharge home in several days pending significant improvement of SBP as there is a concern for high morbidity if patient discharges and attempts to manage as an outpatient.   Consultants:   Gastroenterology  Infectious disease  Interventional radiology  Procedures:   ERCP (7/22) Impression:               - The upper third of the main bile duct and common                            hepatic duct were moderately dilated, secondary to  a stricture.                           - One stent was removed from the biliary tree.                           - Cells for cytology obtained in the upper third of                            the main bile duct.                            - One temporary stent was placed into the common                            bile duct.  Recommendation:           - Avoid aspirin and nonsteroidal anti-inflammatory                            medicines for 3 days.                           - Watch for pancreatitis, bleeding, perforation,                            and cholangitis.                           - Observe patient's clinical course.                           - Continue antibiotics.                           - May need paracentesis, pending clinical course.                           - May need EUS/cholangioscopy, [place],                            pending clinical course.                           - Alcohol cessation.                           - Clear liquid diet today.                           [name] will follow.   PARACENTESIS (7/23, 7/26, 8/2, 8/6)  Antimicrobials:  Zosyn IV  Ceftriaxone IV  Metronidazole IV  Bactrim PO  Augmentin PO  Meropenem IV   Subjective: InterpreterFilomena Jungling #[id number]  No abdominal pain. Rectal pain still present. Suppositories not helping.  Objective: Vitals:   03/13/20 0413 03/13/20 1559 03/13/20 2058 03/14/20 0605  BP: 125/85 135/86 127/88 133/82  Pulse: 77 77 77 84  Resp: 18 18 20  18  Temp: 98.2 F (36.8 C) 98.4 F (36.9 C) 100 F (37.8 C) 98.4 F (36.9 C)  TempSrc: Oral Oral Oral   SpO2: 99% 98% 97% 96%  Weight:      Height:        Intake/Output Summary (Last 24 hours) at 03/14/2020 0910 Last data filed at 03/13/2020 2059 Gross per 24 hour  Intake 720 ml  Output 1200 ml  Net -480 ml   Filed Weights   02/27/20 0351 03/03/20 0439  Weight: (!) 97 kg 91.8 kg    Examination:  General exam: Appears calm and comfortable Respiratory system: Clear to auscultation. Respiratory effort normal. Cardiovascular system: S1 & S2 heard, RRR. No murmurs, rubs, gallops or clicks. Gastrointestinal system: Abdomen is nondistended, soft and mildly tender in right  quadrants. Normal bowel sounds heard. Central nervous system: Alert and oriented. No focal neurological deficits. Musculoskeletal: No edema. No calf tenderness Skin: No cyanosis. No rashes Psychiatry: Judgement and insight appear normal. Mood & affect appropriate.    Data Reviewed: I have personally reviewed following labs and imaging studies  CBC Lab Results  Component Value Date   WBC 10.0 03/13/2020   RBC 4.27 03/13/2020   HGB 13.8 03/13/2020   HCT 40.7 03/13/2020   MCV 95.3 03/13/2020   MCH 32.3 03/13/2020   PLT 408 (H) 03/13/2020   MCHC 33.9 03/13/2020   RDW 13.3 03/13/2020   LYMPHSABS 1.8 03/13/2020   MONOABS 1.1 (H) 03/13/2020   EOSABS 0.1 03/13/2020   BASOSABS 0.1 71/69/6789     Last metabolic panel Lab Results  Component Value Date   NA 131 (L) 03/14/2020   K 4.3 03/14/2020   CL 94 (L) 03/14/2020   CO2 25 03/14/2020   BUN 10 03/14/2020   CREATININE 0.67 03/14/2020   GLUCOSE 112 (H) 03/14/2020   GFRNONAA >60 03/14/2020   GFRAA >60 03/14/2020   CALCIUM 9.0 03/14/2020   PHOS 3.3 03/07/2020   PROT 7.2 03/14/2020   ALBUMIN 2.8 (L) 03/14/2020   BILITOT 1.5 (H) 03/14/2020   ALKPHOS 141 (H) 03/14/2020   AST 43 (H) 03/14/2020   ALT 31 03/14/2020   ANIONGAP 12 03/14/2020    CBG (last 3)  No results for input(s): GLUCAP in the last 72 hours.   GFR: Estimated Creatinine Clearance: 111.2 mL/min (by C-G formula based on SCr of 0.67 mg/dL).  Coagulation Profile: No results for input(s): INR, PROTIME in the last 168 hours.  Recent Results (from the past 240 hour(s))  Gram stain     Status: None   Collection Time: 03/06/20 10:05 AM   Specimen: Abdomen; Peritoneal Fluid  Result Value Ref Range Status   Specimen Description PERITONEAL FLUID  Final   Special Requests ABDOMEN  Final   Gram Stain   Final    CYTOSPIN SMEAR WBC PRESENT,BOTH PMN AND MONONUCLEAR NO ORGANISMS SEEN Performed at [facility], [address] [place], [place] [address]     Report Status 03/08/2020 FINAL  Final  Culture, body fluid-bottle     Status: None   Collection Time: 03/06/20 10:05 AM   Specimen: Peritoneal Washings  Result Value Ref Range Status   Specimen Description PERITONEAL FLUID  Final   Special Requests ABDOMEN  Final   Culture   Final    NO GROWTH 5 DAYS Performed at [facility] [address] [place],  [address]    Report Status 03/11/2020 FINAL  Final  Body fluid culture     Status: None  Collection Time: 03/10/20 12:42 PM   Specimen: Body Fluid  Result Value Ref Range Status   Specimen Description FLUID PERITONEAL  Final   Special Requests NONE  Final   Gram Stain   Final    ABUNDANT WBC PRESENT,BOTH PMN AND MONONUCLEAR NO ORGANISMS SEEN    Culture   Final    NO GROWTH 3 DAYS Performed at [facility], [address], [place], [place] [address]    Report Status 03/14/2020 FINAL  Final  Acid Fast Smear (AFB)     Status: None   Collection Time: 03/10/20 12:42 PM   Specimen: Peritoneal  Result Value Ref Range Status   AFB Specimen Processing Concentration  Final   Acid Fast Smear Negative  Final    Comment: (NOTE) Performed At: [facility] [place], [place] [id number] Rush Farmer MD DJ:[id number]    Source (AFB) PERITONEAL  Final    Comment: FLUID Performed at [facility], [place] [address] [place], [place] [address]         Radiology Studies: IR ABDOMEN US LIMITED  Result Date: 03/13/2020 CLINICAL DATA:  History of ascites and prior paracentesis. EXAM: LIMITED ABDOMEN ULTRASOUND FOR ASCITES TECHNIQUE: Limited ultrasound survey for ascites was performed in all four abdominal quadrants. COMPARISON:  03/10/2020 FINDINGS: Survey of the peritoneal cavity demonstrates no ascites. IMPRESSION: No ascites visualized by ultrasound. Electronically Signed   By: Aletta Edouard M.D.   On: 03/13/2020 13:51        Scheduled Meds:  enoxaparin (LOVENOX) injection  45 mg  Subcutaneous Z30Q   folic acid  1 mg Oral Daily   furosemide  40 mg Oral Daily   Gerhardt's butt cream   Topical TID   hydrocortisone  25 mg Rectal BID   multivitamin with minerals  1 tablet Oral Daily   pantoprazole  40 mg Oral Daily   sodium chloride flush  3 mL Intravenous Q12H   spironolactone  50 mg Oral Daily   thiamine  100 mg Oral Daily   Continuous Infusions:  sodium chloride Stopped (03/06/20 0419)   meropenem (MERREM) IV 1 g (03/14/20 0616)     LOS: 19 days     Cordelia Poche, MD [facility] 03/14/2020, 9:10 AM  If 7PM-7AM, please contact night-coverage [email or web address]

## 2020-03-14 NOTE — Plan of Care (Signed)
  Problem: Education: Goal: Knowledge of General Education information will improve Description Including pain rating scale, medication(s)/side effects and non-pharmacologic comfort measures Outcome: Progressing   

## 2020-03-14 NOTE — Progress Notes (Signed)
[facility] Progress Note  Micheal Watson 59 y.o. 19-Jun-1961  CC: Abdominal pain, bacteremia, SBP   Subjective:  Interpreter software used for history taking.  Resting comfortably in the bed.  Denies abdominal pain, nausea or vomiting.  Denies diarrhea or constipation.   ROS : Afebrile, negative for chest pain   Objective: Vital signs in last 24 hours: Vitals:   03/13/20 2058 03/14/20 0605  BP: 127/88 133/82  Pulse: 77 84  Resp: 20 18  Temp: 100 F (37.8 C) 98.4 F (36.9 C)  SpO2: 97% 96%    Physical Exam:  General:  Alert, cooperative, no distress, appears stated age  Head:  Normocephalic, without obvious abnormality, atraumatic  Eyes:  , EOM's intact,   Lungs:    No visible respiratory distress noted  Heart:  Regular rate and rhythm, S1, S2 normal  Abdomen:    Abdomen is soft, nontender, nondistended, bowel sounds present.  No peritoneal signs  Extremities: Extremities normal, atraumatic, no  edema       Lab Results: Recent Labs    03/13/20 0653 03/14/20 0647  NA 131* 131*  K 4.7 4.3  CL 94* 94*  CO2 27 25  GLUCOSE 118* 112*  BUN 10 10  CREATININE 1.00 0.67  CALCIUM 9.0 9.0   Recent Labs    03/14/20 0647  AST 43*  ALT 31  ALKPHOS 141*  BILITOT 1.5*  PROT 7.2  ALBUMIN 2.8*   Recent Labs    03/13/20 1022  WBC 10.0  NEUTROABS 6.9  HGB 13.8  HCT 40.7  MCV 95.3  PLT 408*   No results for input(s): LABPROT, INR in the last 72 hours.    Assessment/Plan: -Elevated LFTs.  Improving.  Multifactorial from cirrhosis along with alcohol use and history of biliary stricture. -Polymicrobial bacteremia along with SBP. -History of laparoscopic cholecystectomy with postoperative bile leak in April 2021.  Follow-up ERCP with stent placement on November 29, 2019.  Had diagnostic laparoscopy in May 2021.  Another ERCP on February 24, 2020-previously placed stent was partially occluded which was removed.   Another 10 French 7 cm plastic stent was  placed.  Cytology showed atypical cells.  Recommendations ----------------------- -Ultrasound yesterday showed not enough fluid for paracentesis. -Tentatively scheduled for EUS and ERCP/cholangioscopy for evaluation of biliary stricture on Thursday -Continue current management. -Continue antibiotics -GI will follow   Otis Brace MD, FACP 03/14/2020, 10:34 AM  Contact #  [phone]

## 2020-03-15 DIAGNOSIS — E871 Hypo-osmolality and hyponatremia: Secondary | ICD-10-CM

## 2020-03-15 DIAGNOSIS — R17 Unspecified jaundice: Secondary | ICD-10-CM

## 2020-03-15 LAB — COMPREHENSIVE METABOLIC PANEL
ALT: 28 U/L (ref 0–44)
AST: 36 U/L (ref 15–41)
Albumin: 2.8 g/dL — ABNORMAL LOW (ref 3.5–5.0)
Alkaline Phosphatase: 143 U/L — ABNORMAL HIGH (ref 38–126)
Anion gap: 10 (ref 5–15)
BUN: 9 mg/dL (ref 6–20)
CO2: 24 mmol/L (ref 22–32)
Calcium: 8.9 mg/dL (ref 8.9–10.3)
Chloride: 94 mmol/L — ABNORMAL LOW (ref 98–111)
Creatinine, Ser: 0.74 mg/dL (ref 0.61–1.24)
GFR calc Af Amer: >60 mL/min (ref >60)
GFR calc non Af Amer: >60 mL/min (ref >60)
Glucose, Bld: 149 mg/dL — ABNORMAL HIGH (ref 70–99)
Potassium: 4 mmol/L (ref 3.5–5.1)
Sodium: 128 mmol/L — ABNORMAL LOW (ref 135–145)
Total Bilirubin: 0.9 mg/dL (ref 0.3–1.2)
Total Protein: 6.9 g/dL (ref 6.5–8.1)

## 2020-03-15 MED ORDER — GABAPENTIN 100 MG PO CAPS
200.0000 mg | ORAL_CAPSULE | Freq: Three times a day (TID) | ORAL | Status: DC
  Administered 2020-03-15 – 2020-03-16 (×4): 200 mg via ORAL
  Filled 2020-03-15 (×4): qty 2

## 2020-03-15 NOTE — Progress Notes (Addendum)
[facility] Progress Note  Micheal Watson 59 y.o. May 13, 1961  CC:  Cirrhosis, SBP, CBD stricture  Subjective: Patient reports feeling "good" today. Denies any abdominal pain, nausea, vomiting, melena, or hematochezia.  Tolerating a diet.  Reports bilateral foot pain with standing or walking, states it feels as it "someone has run over me with a car."  States pain is isolated to feet and not in legs.   ROS : Review of Systems  Cardiovascular: Negative for chest pain and palpitations.  Gastrointestinal: Negative for abdominal pain, blood in stool, constipation, diarrhea, heartburn, melena, nausea and vomiting.  Musculoskeletal: Negative for falls.       Bilateral foot pain with standing/walking   Objective: Vital signs in last 24 hours: Vitals:   03/14/20 2057 03/15/20 0508  BP: 122/85 130/83  Pulse: 86 89  Resp: 18 16  Temp: 98.3 F (36.8 C) 98.9 F (37.2 C)  SpO2: 97% 98%    Physical Exam:  General:  Alert, cooperative, no distress, appears stated age  Head:  Normocephalic, without obvious abnormality, atraumatic  Eyes:  Anicteric sclera, EOMs intact  Lungs:   Clear to auscultation bilaterally, respirations unlabored  Heart:  Regular rate and rhythm, S1, S2 normal  Abdomen:   Soft, non-tender, non-distended. Bowel sounds active all four quadrants,  no guarding or peritoneal signs.  Extremities: Flaking on bilateral feet, tenderness to palpation of bilateral feet, thick toenails, especially on first toe  Pulses: 2+ and symmetric    Lab Results: Recent Labs    03/13/20 0653 03/14/20 0647  NA 131* 131*  K 4.7 4.3  CL 94* 94*  CO2 27 25  GLUCOSE 118* 112*  BUN 10 10  CREATININE 1.00 0.67  CALCIUM 9.0 9.0   Recent Labs    03/14/20 0647  AST 43*  ALT 31  ALKPHOS 141*  BILITOT 1.5*  PROT 7.2  ALBUMIN 2.8*   Recent Labs    03/13/20 1022  WBC 10.0  NEUTROABS 6.9  HGB 13.8  HCT 40.7  MCV 95.3  PLT 408*   No results for input(s):  LABPROT, INR in the last 72 hours.   Assessment: Cirrhosis with recent SBP.  -WBCs normalized, 10.0 as of yesterday.   -LFTs as of 8/10: T. Bili 1.5/ AST 43/ ALT 31/ ALP 141 -PT/INR 1.0 as of 03/04/20  CBD stricture as seen on ERCP 7/22.  Prior stent removed with temporary stent placed 7/22.  Cytology of bile duct brushing showed atypical cells but not diagnostic of malignancy.  Bile duct stent: No malignant cells identified, fungal organisms present   Bilateral foot pain, flaking of skin, thickened toenails: ?tinea pedis. Hospitalist made aware  Plan: EUS tomorrow with Dr. Paulita Fujita, followed by ERCP with replacement of CBD stent by Dr. Watt Climes.  I thoroughly discussed the procedures to include nature, alternatives, benefits, and risks (including but not limited to pancreatitis, infection, perforation, bleeding, anesthesia/cardiac and pulmonary complications).  Patient verbalized understanding and gave verbal consent to proceed with EUS and ERCP tomorrow.  Repeat PT/INR today to confirm INR remains <2.  Soft diet okay today with NPO after midnight.  Hold Lovenox dose tomorrow 8/12.  [employer] video interpreter services utilized for this encounter Kirtland Bouchard # [phone])  [name] will follow.  Salley Slaughter PA-C 03/15/2020, 9:55 AM  Contact #  [phone]

## 2020-03-15 NOTE — H&P (View-Only) (Signed)
PROGRESS NOTE    Micheal [place]  JOA:[id number] DOB: 05-Jun-1961 DOA: 02/23/2020 PCP: Patient, No Pcp Per   Brief Narrative: Micheal [place] is a 59 y.o. male with a history of ethanol abuse, recent cholecystitis/cholangitis status post CBD stent and JP drain placement.  Patient presented secondary to worsening abdominal pain, nausea, fever.  Patient found to have evidence of sepsis on admission with concern for SBP and found to have associated bacteremia and hepatic abscess.  Also noted to have biliary stricture which was managed by GI with placement of the stent.  Currently on IV antibiotics for the treatment of SBP and bacteremia with concern of possible worsening liver abscess.    Assessment & Plan:   Principal Problem:   Sepsis following intra-abdominal surgery (Micheal [place]) Active Problems:   Hyperbilirubinemia   Essential hypertension   Alcohol dependence with uncomplicated withdrawal ([place])   Jaundice   Hyponatremia   Sepsis Present on admission.  Concern for possible SBP confirmed by cell count from paracentesis.  Patient was initially managed on Zosyn which was transitioned to ceftriaxone and Flagyl IV. Patient was transitioned to Bactrim and Augmentin which was then escalated to meropenem.  SBP In setting of recent abdominal surgery (laparoscopic cholecystectomy on 4/19) with associated obstructed CBD stent.  Patient underwent multiple paracenteses with fluid analysis consistent with SBP.  Previous body fluid cultures appear to have been canceled.  Only most recent fluid culture from 8/2 is resulting in no growth to date.  ERCP performed on 7/22 by GI with stent removal and replacement.  Patient has been on ceftriaxone and Flagyl IV for treatment of SBP.  CT scan on 8/4 significant for changes consistent with peritonitis. Patient initially with down trended white blood cell count which has significantly increased in most recent paracentesis (8/6). Meropenem started on 8/6.  Paracentesis attempted on 8/9 without enough ascites on ultrasound. -Infectious disease recommendations: meropenem; consulted today for further recommendations -General surgery recommendations: no surgical options at this time; signed off -GI recommendations: tentative plan for EUS and ERCP/cholangioscopy on 8/12  Hepatic abscess Seen on recent CT scan from 7/26.  Patient still has discontinued right upper quadrant abdominal pain and spiked spiked fever 100.4 F overnight on 8/3. Tmax of 100 F in last 24 hours.  Leukocytosis is persisted but mild and now improved.  CT abdomen shows improving abscesses.  Biliary stricture Patient underwent ERCP as mentioned above.  Stent placement mentioned above.  Hyponatremia Secondary to alcohol use.  Mostly mild. In setting of hepatic disease. Asymptomatic. Stable.  Polymicrobial bacteremia In setting of SBP.  Blood cultures significant for Proteus penneri, E. coli, Klebsiella pneumonia.  Patient is on antibiotics as mentioned above.  Alcohol abuse history of alcohol cirrhosis No current withdrawal symptoms.  Rectal pain Nothing seen on physical exam. Appears to have improved with Anusol  History of protein calorie malnutrition  Bilateral pain in the surface plantar Likely neuropathic in nature. Monitor.  Add gabapentin.   DVT prophylaxis: Lovenox Code Status:   Code Status: Full Code Family Communication: None at bedside Disposition Plan: Discharge home in several days pending significant improvement of SBP as there is a concern for high morbidity if patient discharges and attempts to manage as an outpatient.   Consultants:   Gastroenterology  Infectious disease  Interventional radiology  Procedures:   ERCP (7/22) Impression:               - The upper third of the main bile duct and common  hepatic duct were moderately dilated, secondary to                            a stricture.                            - One stent was removed from the biliary tree.                           - Cells for cytology obtained in the upper third of                            the main bile duct.                           - One temporary stent was placed into the common                            bile duct.  Recommendation:           - Avoid aspirin and nonsteroidal anti-inflammatory                            medicines for 3 days.                           - Watch for pancreatitis, bleeding, perforation,                            and cholangitis.                           - Observe patient's clinical course.                           - Continue antibiotics.                           - May need paracentesis, pending clinical course.                           - May need EUS/cholangioscopy, down-the-road,                            pending clinical course.                           - Alcohol cessation.                           - Clear liquid diet today.                           [facility] [place] [facility] will follow.   PARACENTESIS (7/23, 7/26, 8/2, 8/6)  Antimicrobials:  Zosyn IV  Ceftriaxone IV  Metronidazole IV  Bactrim PO  Augmentin PO  Meropenem IV   Subjective: Interpreter was used.  Reports bilateral pain in foot.  No nausea  no vomiting.  Objective: Vitals:   03/14/20 1407 03/14/20 2057 03/15/20 0508 03/15/20 1327  BP: 115/82 122/85 130/83 128/84  Pulse: 87 86 89 85  Resp: 18 18 16 18   Temp: 99.3 F (37.4 C) 98.3 F (36.8 C) 98.9 F (37.2 C) 98.4 F (36.9 C)  TempSrc: Oral Oral Oral Oral  SpO2: 97% 97% 98% 98%  Weight:      Height:        Intake/Output Summary (Last 24 hours) at 03/15/2020 2021 Last data filed at 03/15/2020 1502 Gross per 24 hour  Intake 680 ml  Output 495 ml  Net 185 ml   Filed Weights   02/27/20 0351 03/03/20 0439  Weight: (!) 97 kg 91.8 kg    Examination:  General exam: Appears calm and comfortable Respiratory system: Clear to auscultation. Respiratory  effort normal. Cardiovascular system: S1 & S2 heard, RRR. No murmurs, rubs, gallops or clicks. Gastrointestinal system: Abdomen is nondistended, soft and mildly tender in right quadrants. Normal bowel sounds heard. Central nervous system: Alert and oriented. No focal neurological deficits. Musculoskeletal: No edema. No calf tenderness Skin: No cyanosis. No rashes Psychiatry: Judgement and insight appear normal. Mood & affect appropriate.  Negative straight leg raising test.   Data Reviewed: I have personally reviewed following labs and imaging studies  CBC Lab Results  Component Value Date   WBC 10.0 03/13/2020   RBC 4.27 03/13/2020   HGB 13.8 03/13/2020   HCT 40.7 03/13/2020   MCV 95.3 03/13/2020   MCH 32.3 03/13/2020   PLT 408 (H) 03/13/2020   MCHC 33.9 03/13/2020   RDW 13.3 03/13/2020   LYMPHSABS 1.8 03/13/2020   MONOABS 1.1 (H) 03/13/2020   EOSABS 0.1 03/13/2020   BASOSABS 0.1 78/46/9629     Last metabolic panel Lab Results  Component Value Date   NA 128 (L) 03/15/2020   K 4.0 03/15/2020   CL 94 (L) 03/15/2020   CO2 24 03/15/2020   BUN 9 03/15/2020   CREATININE 0.74 03/15/2020   GLUCOSE 149 (H) 03/15/2020   GFRNONAA >60 03/15/2020   GFRAA >60 03/15/2020   CALCIUM 8.9 03/15/2020   PHOS 3.3 03/07/2020   PROT 6.9 03/15/2020   ALBUMIN 2.8 (L) 03/15/2020   BILITOT 0.9 03/15/2020   ALKPHOS 143 (H) 03/15/2020   AST 36 03/15/2020   ALT 28 03/15/2020   ANIONGAP 10 03/15/2020    CBG (last 3)  No results for input(s): GLUCAP in the last 72 hours.   GFR: Estimated Creatinine Clearance: 111.2 mL/min (by C-G formula based on SCr of 0.74 mg/dL).  Coagulation Profile: No results for input(s): INR, PROTIME in the last 168 hours.  Recent Results (from the past 240 hour(s))  Gram stain     Status: None   Collection Time: 03/06/20 10:05 AM   Specimen: Abdomen; Peritoneal Fluid  Result Value Ref Range Status   Specimen Description PERITONEAL FLUID  Final   Special  Requests ABDOMEN  Final   Gram Stain   Final    CYTOSPIN SMEAR WBC PRESENT,BOTH PMN AND MONONUCLEAR NO ORGANISMS SEEN Performed at [facility], [address], [place], [place] [address]    Report Status 03/08/2020 FINAL  Final  Culture, body fluid-bottle     Status: None   Collection Time: 03/06/20 10:05 AM   Specimen: Peritoneal Washings  Result Value Ref Range Status   Specimen Description PERITONEAL FLUID  Final   Special Requests ABDOMEN  Final   Culture   Final  NO GROWTH 5 DAYS Performed at [facility], [place] [address] [place], [place] [address]    Report Status 03/11/2020 FINAL  Final  Body fluid culture     Status: None   Collection Time: 03/10/20 12:42 PM   Specimen: Body Fluid  Result Value Ref Range Status   Specimen Description FLUID PERITONEAL  Final   Special Requests NONE  Final   Gram Stain   Final    ABUNDANT WBC PRESENT,BOTH PMN AND MONONUCLEAR NO ORGANISMS SEEN    Culture   Final    NO GROWTH 3 DAYS Performed at [facility], [address], [place], [place] [address]    Report Status 03/14/2020 FINAL  Final  Acid Fast Smear (AFB)     Status: None   Collection Time: 03/10/20 12:42 PM   Specimen: Peritoneal  Result Value Ref Range Status   AFB Specimen Processing Concentration  Final   Acid Fast Smear Negative  Final    Comment: (NOTE) Performed At: [facility] [place], [place] [id number] Rush Farmer MD [id number]    Source (AFB) PERITONEAL  Final    Comment: FLUID Performed at [facility], [place] [address], [place], [place] [address]         Radiology Studies: No results found.      Scheduled Meds: . folic acid  1 mg Oral Daily  . furosemide  40 mg Oral Daily  . gabapentin  200 mg Oral TID  . Gerhardt's butt cream   Topical TID  . hydrocortisone  25 mg Rectal BID  . multivitamin with minerals  1 tablet Oral Daily  . pantoprazole  40 mg Oral Daily  . sodium chloride flush   3 mL Intravenous Q12H  . spironolactone  50 mg Oral Daily  . thiamine  100 mg Oral Daily   Continuous Infusions: . sodium chloride Stopped (03/06/20 0419)  . meropenem (MERREM) IV 1 g (03/15/20 1326)     LOS: 20 days     Cordelia Poche, MD [facility] 03/15/2020, 8:21 PM  If 7PM-7AM, please contact night-coverage [email or web address]

## 2020-03-15 NOTE — Progress Notes (Signed)
PROGRESS NOTE    Micheal [place]  ACZ:[id number] DOB: [birth year] DOA: 02/23/2020 PCP: Patient, No Pcp Per   Brief Narrative: Micheal [place] is a 59 y.o. male with a history of ethanol abuse, recent cholecystitis/cholangitis status post CBD stent and JP drain placement.  Patient presented secondary to worsening abdominal pain, nausea, fever.  Patient found to have evidence of sepsis on admission with concern for SBP and found to have associated bacteremia and hepatic abscess.  Also noted to have biliary stricture which was managed by GI with placement of the stent.  Currently on IV antibiotics for the treatment of SBP and bacteremia with concern of possible worsening liver abscess.    Assessment & Plan:   Principal Problem:   Sepsis following intra-abdominal surgery ([place]) Active Problems:   Hyperbilirubinemia   Essential hypertension   Alcohol dependence with uncomplicated withdrawal ([place])   Jaundice   Hyponatremia   Sepsis Present on admission.  Concern for possible SBP confirmed by cell count from paracentesis.  Patient was initially managed on Zosyn which was transitioned to ceftriaxone and Flagyl IV. Patient was transitioned to Bactrim and Augmentin which was then escalated to meropenem.  SBP In setting of recent abdominal surgery (laparoscopic cholecystectomy on 4/19) with associated obstructed CBD stent.  Patient underwent multiple paracenteses with fluid analysis consistent with SBP.  Previous body fluid cultures appear to have been canceled.  Only most recent fluid culture from 8/2 is resulting in no growth to date.  ERCP performed on 7/22 by GI with stent removal and replacement.  Patient has been on ceftriaxone and Flagyl IV for treatment of SBP.  CT scan on 8/4 significant for changes consistent with peritonitis. Patient initially with down trended white blood cell count which has significantly increased in most recent paracentesis (8/6). Meropenem started on 8/6.  Paracentesis attempted on 8/9 without enough ascites on ultrasound. -Infectious disease recommendations: meropenem; consulted today for further recommendations -General surgery recommendations: no surgical options at this time; signed off -GI recommendations: tentative plan for EUS and ERCP/cholangioscopy on 8/12  Hepatic abscess Seen on recent CT scan from 7/26.  Patient still has discontinued right upper quadrant abdominal pain and spiked spiked fever 100.4 F overnight on 8/3. Tmax of 100 F in last 24 hours.  Leukocytosis is persisted but mild and now improved.  CT abdomen shows improving abscesses.  Biliary stricture Patient underwent ERCP as mentioned above.  Stent placement mentioned above.  Hyponatremia Secondary to alcohol use.  Mostly mild. In setting of hepatic disease. Asymptomatic. Stable.  Polymicrobial bacteremia In setting of SBP.  Blood cultures significant for Proteus penneri, E. coli, Klebsiella pneumonia.  Patient is on antibiotics as mentioned above.  Alcohol abuse history of alcohol cirrhosis No current withdrawal symptoms.  Rectal pain Nothing seen on physical exam. Appears to have improved with Anusol  History of protein calorie malnutrition  Bilateral pain in the surface plantar Likely neuropathic in nature. Monitor.  Add gabapentin.   DVT prophylaxis: Lovenox Code Status:   Code Status: Full Code Family Communication: None at bedside Disposition Plan: Discharge home in several days pending significant improvement of SBP as there is a concern for high morbidity if patient discharges and attempts to manage as an outpatient.   Consultants:   Gastroenterology  Infectious disease  Interventional radiology  Procedures:   ERCP (7/22) Impression:               - The upper third of the main bile duct and common  hepatic duct were moderately dilated, secondary to                            a stricture.                            - One stent was removed from the biliary tree.                           - Cells for cytology obtained in the upper third of                            the main bile duct.                           - One temporary stent was placed into the common                            bile duct.  Recommendation:           - Avoid aspirin and nonsteroidal anti-inflammatory                            medicines for 3 days.                           - Watch for pancreatitis, bleeding, perforation,                            and cholangitis.                           - Observe patient's clinical course.                           - Continue antibiotics.                           - May need paracentesis, pending clinical course.                           - May need EUS/cholangioscopy, down-the-road,                            pending clinical course.                           - Alcohol cessation.                           - Clear liquid diet today.                           [facility] [place] [facility] will follow.   PARACENTESIS (7/23, 7/26, 8/2, 8/6)  Antimicrobials:  Zosyn IV  Ceftriaxone IV  Metronidazole IV  Bactrim PO  Augmentin PO  Meropenem IV   Subjective: Interpreter was used.  Reports bilateral pain in foot.  No nausea  no vomiting.  Objective: Vitals:   03/14/20 1407 03/14/20 2057 03/15/20 0508 03/15/20 1327  BP: 115/82 122/85 130/83 128/84  Pulse: 87 86 89 85  Resp: 18 18 16 18   Temp: 99.3 F (37.4 C) 98.3 F (36.8 C) 98.9 F (37.2 C) 98.4 F (36.9 C)  TempSrc: Oral Oral Oral Oral  SpO2: 97% 97% 98% 98%  Weight:      Height:        Intake/Output Summary (Last 24 hours) at 03/15/2020 2021 Last data filed at 03/15/2020 1502 Gross per 24 hour  Intake 680 ml  Output 495 ml  Net 185 ml   Filed Weights   02/27/20 0351 03/03/20 0439  Weight: (!) 97 kg 91.8 kg    Examination:  General exam: Appears calm and comfortable Respiratory system: Clear to auscultation. Respiratory  effort normal. Cardiovascular system: S1 & S2 heard, RRR. No murmurs, rubs, gallops or clicks. Gastrointestinal system: Abdomen is nondistended, soft and mildly tender in right quadrants. Normal bowel sounds heard. Central nervous system: Alert and oriented. No focal neurological deficits. Musculoskeletal: No edema. No calf tenderness Skin: No cyanosis. No rashes Psychiatry: Judgement and insight appear normal. Mood & affect appropriate.  Negative straight leg raising test.   Data Reviewed: I have personally reviewed following labs and imaging studies  CBC Lab Results  Component Value Date   WBC 10.0 03/13/2020   RBC 4.27 03/13/2020   HGB 13.8 03/13/2020   HCT 40.7 03/13/2020   MCV 95.3 03/13/2020   MCH 32.3 03/13/2020   PLT 408 (H) 03/13/2020   MCHC 33.9 03/13/2020   RDW 13.3 03/13/2020   LYMPHSABS 1.8 03/13/2020   MONOABS 1.1 (H) 03/13/2020   EOSABS 0.1 03/13/2020   BASOSABS 0.1 03/50/0938     Last metabolic panel Lab Results  Component Value Date   NA 128 (L) 03/15/2020   K 4.0 03/15/2020   CL 94 (L) 03/15/2020   CO2 24 03/15/2020   BUN 9 03/15/2020   CREATININE 0.74 03/15/2020   GLUCOSE 149 (H) 03/15/2020   GFRNONAA >60 03/15/2020   GFRAA >60 03/15/2020   CALCIUM 8.9 03/15/2020   PHOS 3.3 03/07/2020   PROT 6.9 03/15/2020   ALBUMIN 2.8 (L) 03/15/2020   BILITOT 0.9 03/15/2020   ALKPHOS 143 (H) 03/15/2020   AST 36 03/15/2020   ALT 28 03/15/2020   ANIONGAP 10 03/15/2020    CBG (last 3)  No results for input(s): GLUCAP in the last 72 hours.   GFR: Estimated Creatinine Clearance: 111.2 mL/min (by C-G formula based on SCr of 0.74 mg/dL).  Coagulation Profile: No results for input(s): INR, PROTIME in the last 168 hours.  Recent Results (from the past 240 hour(s))  Gram stain     Status: None   Collection Time: 03/06/20 10:05 AM   Specimen: Abdomen; Peritoneal Fluid  Result Value Ref Range Status   Specimen Description PERITONEAL FLUID  Final   Special  Requests ABDOMEN  Final   Gram Stain   Final    CYTOSPIN SMEAR WBC PRESENT,BOTH PMN AND MONONUCLEAR NO ORGANISMS SEEN Performed at [facility], [address] [place],  [address]    Report Status 03/08/2020 FINAL  Final  Culture, body fluid-bottle     Status: None   Collection Time: 03/06/20 10:05 AM   Specimen: Peritoneal Washings  Result Value Ref Range Status   Specimen Description PERITONEAL FLUID  Final   Special Requests ABDOMEN  Final   Culture   Final  NO GROWTH 5 DAYS Performed at [facility], [place] [address] [place], [place] [address]    Report Status 03/11/2020 FINAL  Final  Body fluid culture     Status: None   Collection Time: 03/10/20 12:42 PM   Specimen: Body Fluid  Result Value Ref Range Status   Specimen Description FLUID PERITONEAL  Final   Special Requests NONE  Final   Gram Stain   Final    ABUNDANT WBC PRESENT,BOTH PMN AND MONONUCLEAR NO ORGANISMS SEEN    Culture   Final    NO GROWTH 3 DAYS Performed at [facility], [address], [place], [place] [address]    Report Status 03/14/2020 FINAL  Final  Acid Fast Smear (AFB)     Status: None   Collection Time: 03/10/20 12:42 PM   Specimen: Peritoneal  Result Value Ref Range Status   AFB Specimen Processing Concentration  Final   Acid Fast Smear Negative  Final    Comment: (NOTE) Performed At: [facility] [place], [place] [id number] Rush Farmer MD [id number]    Source (AFB) PERITONEAL  Final    Comment: FLUID Performed at [facility], [place] [address] [place], [place] [address]         Radiology Studies: No results found.      Scheduled Meds: . folic acid  1 mg Oral Daily  . furosemide  40 mg Oral Daily  . gabapentin  200 mg Oral TID  . Gerhardt's butt cream   Topical TID  . hydrocortisone  25 mg Rectal BID  . multivitamin with minerals  1 tablet Oral Daily  . pantoprazole  40 mg Oral Daily  . sodium chloride flush   3 mL Intravenous Q12H  . spironolactone  50 mg Oral Daily  . thiamine  100 mg Oral Daily   Continuous Infusions: . sodium chloride Stopped (03/06/20 0419)  . meropenem (MERREM) IV 1 g (03/15/20 1326)     LOS: 20 days     Cordelia Poche, MD [facility] 03/15/2020, 8:21 PM  If 7PM-7AM, please contact night-coverage [email or web address]

## 2020-03-16 ENCOUNTER — Inpatient Hospital Stay (HOSPITAL_COMMUNITY): Payer: Self-pay | Admitting: Certified Registered Nurse Anesthetist

## 2020-03-16 ENCOUNTER — Inpatient Hospital Stay (HOSPITAL_COMMUNITY): Payer: Self-pay

## 2020-03-16 ENCOUNTER — Encounter (HOSPITAL_COMMUNITY)
Admission: EM | Disposition: A | Discharge status: Home / Self Care | Payer: Self-pay | Source: Home / Self Care | Attending: Family Medicine

## 2020-03-16 ENCOUNTER — Encounter (HOSPITAL_COMMUNITY): Payer: Self-pay | Admitting: Internal Medicine

## 2020-03-16 HISTORY — PX: STENT REMOVAL: SHX6421

## 2020-03-16 HISTORY — PX: ENDOSCOPIC RETROGRADE CHOLANGIOPANCREATOGRAPHY (ERCP) WITH PROPOFOL: SHX5810

## 2020-03-16 HISTORY — PX: FINE NEEDLE ASPIRATION: SHX5430

## 2020-03-16 HISTORY — PX: ESOPHAGOGASTRODUODENOSCOPY (EGD) WITH PROPOFOL: SHX5813

## 2020-03-16 HISTORY — PX: UPPER ESOPHAGEAL ENDOSCOPIC ULTRASOUND (EUS): SHX6562

## 2020-03-16 LAB — TSH: TSH: 7.906 u[IU]/mL — ABNORMAL HIGH (ref 0.350–4.500)

## 2020-03-16 LAB — PROTIME-INR
INR: 1.2 (ref 0.8–1.2)
Prothrombin Time: 14.8 seconds (ref 11.4–15.2)

## 2020-03-16 LAB — COMPREHENSIVE METABOLIC PANEL
ALT: 29 U/L (ref 0–44)
AST: 34 U/L (ref 15–41)
Albumin: 2.7 g/dL — ABNORMAL LOW (ref 3.5–5.0)
Alkaline Phosphatase: 145 U/L — ABNORMAL HIGH (ref 38–126)
Anion gap: 11 (ref 5–15)
BUN: 11 mg/dL (ref 6–20)
CO2: 25 mmol/L (ref 22–32)
Calcium: 8.9 mg/dL (ref 8.9–10.3)
Chloride: 94 mmol/L — ABNORMAL LOW (ref 98–111)
Creatinine, Ser: 0.73 mg/dL (ref 0.61–1.24)
GFR calc Af Amer: >60 mL/min (ref >60)
GFR calc non Af Amer: >60 mL/min (ref >60)
Glucose, Bld: 123 mg/dL — ABNORMAL HIGH (ref 70–99)
Potassium: 4.4 mmol/L (ref 3.5–5.1)
Sodium: 130 mmol/L — ABNORMAL LOW (ref 135–145)
Total Bilirubin: 1.5 mg/dL — ABNORMAL HIGH (ref 0.3–1.2)
Total Protein: 7 g/dL (ref 6.5–8.1)

## 2020-03-16 LAB — FOLATE: Folate: 18.7 ng/mL (ref >5.9)

## 2020-03-16 LAB — VITAMIN B12: Vitamin B-12: 283 pg/mL (ref 180–914)

## 2020-03-16 LAB — C-REACTIVE PROTEIN: CRP: 3.8 mg/dL — ABNORMAL HIGH (ref <1.0)

## 2020-03-16 LAB — SEDIMENTATION RATE: Sed Rate: 60 mm/hr — ABNORMAL HIGH (ref 0–16)

## 2020-03-16 SURGERY — ESOPHAGOGASTRODUODENOSCOPY (EGD) WITH PROPOFOL
Anesthesia: General

## 2020-03-16 MED ORDER — CYANOCOBALAMIN 1000 MCG/ML IJ SOLN
1000.0000 ug | Freq: Once | INTRAMUSCULAR | Status: AC
  Administered 2020-03-16: 1000 ug via SUBCUTANEOUS
  Filled 2020-03-16: qty 1

## 2020-03-16 MED ORDER — SODIUM CHLORIDE (PF) 0.9 % IJ SOLN
INTRAMUSCULAR | Status: DC | PRN
  Administered 2020-03-16: 20 mL

## 2020-03-16 MED ORDER — SUGAMMADEX SODIUM 200 MG/2ML IV SOLN
INTRAVENOUS | Status: DC | PRN
  Administered 2020-03-16: 200 mg via INTRAVENOUS

## 2020-03-16 MED ORDER — PROMETHAZINE HCL 25 MG/ML IJ SOLN: 6.2500 mg | INTRAMUSCULAR | Status: DC | PRN

## 2020-03-16 MED ORDER — FENTANYL CITRATE (PF) 100 MCG/2ML IJ SOLN
INTRAMUSCULAR | Status: DC | PRN
  Administered 2020-03-16 (×2): 25 ug via INTRAVENOUS

## 2020-03-16 MED ORDER — ROCURONIUM BROMIDE 10 MG/ML (PF) SYRINGE
PREFILLED_SYRINGE | INTRAVENOUS | Status: DC | PRN
  Administered 2020-03-16: 20 mg via INTRAVENOUS
  Administered 2020-03-16: 40 mg via INTRAVENOUS

## 2020-03-16 MED ORDER — INDOMETHACIN 50 MG RE SUPP
RECTAL | Status: AC
  Filled 2020-03-16: qty 2

## 2020-03-16 MED ORDER — CIPROFLOXACIN IN D5W 400 MG/200ML IV SOLN
INTRAVENOUS | Status: AC
  Filled 2020-03-16: qty 200

## 2020-03-16 MED ORDER — PHENOL 1.4 % MT LIQD
1.0000 | OROMUCOSAL | Status: DC | PRN
  Filled 2020-03-16: qty 177

## 2020-03-16 MED ORDER — LIDOCAINE 2% (20 MG/ML) 5 ML SYRINGE
INTRAMUSCULAR | Status: DC | PRN
  Administered 2020-03-16: 100 mg via INTRAVENOUS

## 2020-03-16 MED ORDER — FENTANYL CITRATE (PF) 100 MCG/2ML IJ SOLN
INTRAMUSCULAR | Status: AC
  Filled 2020-03-16: qty 2

## 2020-03-16 MED ORDER — HYDROMORPHONE HCL 1 MG/ML IJ SOLN: 0.2500 mg | INTRAMUSCULAR | Status: DC | PRN

## 2020-03-16 MED ORDER — LACTATED RINGERS IV SOLN
INTRAVENOUS | Status: DC
  Administered 2020-03-16: 1000 mL via INTRAVENOUS

## 2020-03-16 MED ORDER — DEXAMETHASONE SODIUM PHOSPHATE 10 MG/ML IJ SOLN
INTRAMUSCULAR | Status: DC | PRN
  Administered 2020-03-16: 4 mg via INTRAVENOUS

## 2020-03-16 MED ORDER — GABAPENTIN 300 MG PO CAPS
300.0000 mg | ORAL_CAPSULE | Freq: Three times a day (TID) | ORAL | Status: DC
  Administered 2020-03-16 – 2020-03-20 (×12): 300 mg via ORAL
  Filled 2020-03-16 (×12): qty 1

## 2020-03-16 MED ORDER — SUCCINYLCHOLINE CHLORIDE 20 MG/ML IJ SOLN
INTRAMUSCULAR | Status: DC | PRN
  Administered 2020-03-16: 160 mg via INTRAVENOUS

## 2020-03-16 MED ORDER — SODIUM CHLORIDE 0.9 % IV SOLN: INTRAVENOUS | Status: DC

## 2020-03-16 MED ORDER — PROPOFOL 10 MG/ML IV BOLUS
INTRAVENOUS | Status: DC | PRN
  Administered 2020-03-16: 150 mg via INTRAVENOUS

## 2020-03-16 MED ORDER — GLUCAGON HCL RDNA (DIAGNOSTIC) 1 MG IJ SOLR
INTRAMUSCULAR | Status: AC
  Filled 2020-03-16: qty 1

## 2020-03-16 SURGICAL SUPPLY — 15 items

## 2020-03-16 NOTE — Progress Notes (Signed)
Nutrition Brief Note  RD pulled to chart due to LOS (21 days)  Wt Readings from Last 15 Encounters:  03/16/20 91.8 kg  12/24/19 88.2 kg  11/28/19 96.4 kg  11/21/19 95.1 kg  04/09/19 87.1 kg  11/20/18 87.4 kg   Micheal [place] is a 59 y.o. male with a history of ethanol abuse, recent cholecystitis/cholangitis status post CBD stent and JP drain placement.  Patient presented secondary to worsening abdominal pain, nausea, fever.  Patient found to have evidence of sepsis on admission with concern for SBP and found to have associated bacteremia and hepatic abscess.  Also noted to have biliary stricture which was managed by GI with placement of the stent.  Currently on IV antibiotics for the treatment of SBP and bacteremia with concern of possible worsening liver abscess.  Pt admitted with sepsis following intra-abdominal surgery and hepatic abscess.   Reviewed I/O's: -100 ml x 24 hours and +257 ml since 03/02/20  UOP: 620 ml x 24 hours  Pt out of room at time of attempted contact.   Per GI notes, plan for EUS with ERCP for stent placement for CBD stricture today.   Current diet order is NPO (previously Heart Healthy), patient is consuming approximately 100% of meals at this time. Labs and medications reviewed.   No nutrition interventions warranted at this time. If nutrition issues arise, please consult RD.   Loistine Chance, RD, LDN, [place] Registered Dietitian II Certified Diabetes Care and Education Specialist Please refer to [facility] for RD and/or RD on-call/weekend/after hours pager

## 2020-03-16 NOTE — Op Note (Signed)
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 Tilden Dome, RN, Burtis Junes, RN,                            Laverda Sorenson, Technician, Rejeana Brock, CRNA Referring MD:              Medicines:                General Anesthesia Complications:            No immediate complications. Estimated Blood Loss:     Estimated blood loss: none. Procedure:                Pre-Anesthesia Assessment:                           - Prior to the procedure, a History and Physical                            was performed, and patient medications and                            allergies were reviewed. The patient's tolerance of                            previous anesthesia was also reviewed. The risks                            and benefits of the procedure and the sedation                            options and risks were discussed with the patient.                            All questions were answered, and informed consent                            was obtained. Prior Anticoagulants: The patient has                            taken no previous anticoagulant or antiplatelet                            agents. ASA Grade Assessment: III - A patient with                            severe systemic disease. After reviewing the risks  and benefits, the patient was deemed in                            satisfactory condition to undergo the procedure.                           - Prior to the procedure, a History and Physical                            was performed, and patient medications and                            allergies were reviewed. The patient's tolerance of                             previous anesthesia was also reviewed. The risks                            and benefits of the procedure and the sedation                            options and risks were discussed with the patient.                            All questions were answered, and informed consent                            was obtained. Prior Anticoagulants: The patient has                            taken Lovenox (enoxaparin), last dose was day of                            procedure. ASA Grade Assessment: III - A patient                            with severe systemic disease. After reviewing the                            risks and benefits, the patient was deemed in                            satisfactory condition to undergo the procedure.                           After obtaining informed consent, the scope was                            passed under direct vision. Throughout the                            procedure, the patient's blood pressure, pulse, and  oxygen saturations were monitored continuously. The                            [id number]-[id number] ([id number] Olympus Duodensocope was                            introduced through the mouth, and used to inject                            contrast into and used to cannulate the bile duct.                            The ERCP was accomplished without difficulty. The                            patient tolerated the procedure well. Scope In: Scope Out: Findings:      A biliary sphincterotomy had been performed. The sphincterotomy appeared       open deep selective cannulation was readily obtained and there was no       pancreatic duct injection or wire advancement throughout the procedure.       The major papilla was normal. On initial cholangiogram the stricture was       confirmed at about 4-1/2 cm from the scope and there seemed to be a air       bubble versus stone just above it so the biliary tree was swept with am       adjustable  9- 12 mm balloon starting at the bifurcation. The 12 mm       balloon advanced to the stricture and was lowered to 9 mm and passed       readily through the stricture and some sludge was swept from the duct.       But no obvious stone was delivered or seen after sweeping and we elected       to place one 10 Fr by 6 cm covered metal stent with no external flaps       and no internal flaps was placed 5.5 cm into the common bile duct. Bile       flowed through the stent. The stent was in good position. The introducer       wire and scope were removed and the patient tolerated the procedure well Impression:               - Prior biliary sphincterotomy appeared open. No                            pancreatic duct injection or wire advancement                            throughout the procedure                           - The major papilla appeared normal.                           - The biliary tree was swept and minimal sludge was  found.                           - One covered metal stent was placed into the                            common bile duct. Recommendation:           - Clear liquid diet for 6 hours.                           - Continue present medications.                           - Await final cytology results.                           - Return to GI clinic PRN. Will probably need                            oncology and surgical consult if biopsies are                            positive which was the initial expectation of the                            pathologist                           - Telephone GI clinic if symptomatic PRN.                           - Check liver enzymes (AST, ALT, alkaline                            phosphatase, bilirubin) and hemogram with white                            blood cell count and platelets in the morning. Procedure Code(s):        --- Professional ---                           [phone], Endoscopic retrograde                             cholangiopancreatography (ERCP); with placement of                            endoscopic stent into biliary or pancreatic duct,                            including pre- and post-dilation and guide wire                            passage, when performed, including sphincterotomy,  when performed, each stent                           [id number], Endoscopic retrograde                            cholangiopancreatography (ERCP); with removal of                            calculi/debris from biliary/pancreatic duct(s) Diagnosis Code(s):        --- Professional ---                           K83.1, Obstruction of bile duct CPT copyright 2019 American [employer] Association. All rights reserved. The codes documented in this report are preliminary and upon coder review may  be revised to meet current compliance requirements. Clarene Essex, MD 03/16/2020 11:29:06 AM This report has been signed electronically. Number of Addenda: 0

## 2020-03-16 NOTE — Anesthesia Preprocedure Evaluation (Addendum)
Anesthesia Evaluation  Patient identified by MRN, date of birth, ID band Patient awake    Reviewed: Allergy & Precautions, NPO status , Patient's Chart, lab work & pertinent test results  Airway Mallampati: II  TM Distance: >3 FB Neck ROM: Full    Dental  (+) Missing, Loose, Poor Dentition, Dental Advisory Given,    Pulmonary former smoker,  Former smoker, quit March 2020-  H/o COVID   Pulmonary exam normal breath sounds clear to auscultation       Cardiovascular hypertension, Pt. on medications Normal cardiovascular exam Rhythm:Regular Rate:Normal     Neuro/Psych PSYCHIATRIC DISORDERS negative neurological ROS     GI/Hepatic (+) Hepatitis -  Endo/Other  Pre-diabetic  Renal/GU negative Renal ROS     Musculoskeletal negative musculoskeletal ROS (+)   Abdominal (+) + obese,   Peds  Hematology negative hematology ROS (+)   Anesthesia Other Findings   Reproductive/Obstetrics                            Anesthesia Physical  Anesthesia Plan  ASA: III  Anesthesia Plan: General   Post-op Pain Management:    Induction: Intravenous  PONV Risk Score and Plan: 2 and Ondansetron, Midazolam and Treatment may vary due to age or medical condition  Airway Management Planned: Oral ETT  Additional Equipment: None  Intra-op Plan:   Post-operative Plan: Extubation in OR  Informed Consent: I have reviewed the patients History and Physical, chart, labs and discussed the procedure including the risks, benefits and alternatives for the proposed anesthesia with the patient or authorized representative who has indicated his/her understanding and acceptance.     Dental advisory given and Interpreter used for interveiw  Plan Discussed with: CRNA and Anesthesiologist  Anesthesia Plan Comments: (No nausea/vomiting. Plan for GETA. 1 PIV. Interpreter used for consent and preop.)      Anesthesia  Quick Evaluation

## 2020-03-16 NOTE — Plan of Care (Signed)
  Problem: Education: Goal: Knowledge of General Education information will improve Description Including pain rating scale, medication(s)/side effects and non-pharmacologic comfort measures Outcome: Progressing   

## 2020-03-16 NOTE — Transfer of Care (Signed)
Immediate Anesthesia Transfer of Care Note  Patient: Micheal Watson  Procedure(s) Performed: ESOPHAGOGASTRODUODENOSCOPY (EGD) WITH PROPOFOL (N/A ) UPPER ESOPHAGEAL ENDOSCOPIC ULTRASOUND (EUS) (N/A ) FINE NEEDLE ASPIRATION (FNA) LINEAR STENT REMOVAL ENDOSCOPIC RETROGRADE CHOLANGIOPANCREATOGRAPHY (ERCP) WITH PROPOFOL (N/A )  Patient Location: Endoscopy Unit  Anesthesia Type:General  Level of Consciousness: awake, alert  and oriented  Airway & Oxygen Therapy: Patient Spontanous Breathing and Patient connected to nasal cannula oxygen  Post-op Assessment: Report given to RN and Post -op Vital signs reviewed and stable  Post vital signs: Reviewed and stable  Last Vitals:  Vitals Value Taken Time  BP 143/83 03/16/20 1115  Temp 37.3 C 03/16/20 1115  Pulse 87 03/16/20 1126  Resp 15 03/16/20 1126  SpO2 97 % 03/16/20 1126  Vitals shown include unvalidated device data.  Last Pain:  Vitals:   03/16/20 1115  TempSrc: Oral  PainSc: 0-No pain      Patients Stated Pain Goal: 0 (15/94/70 7615)  Complications: No complications documented.

## 2020-03-16 NOTE — Anesthesia Procedure Notes (Signed)
Procedure Name: Intubation Date/Time: 03/16/2020 9:23 AM Performed by: Inda Coke, CRNA Pre-anesthesia Checklist: Patient identified, Emergency Drugs available, Suction available and Patient being monitored Patient Re-evaluated:Patient Re-evaluated prior to induction Oxygen Delivery Method: Circle System Utilized Preoxygenation: Pre-oxygenation with 100% oxygen Induction Type: IV induction Ventilation: Mask ventilation without difficulty Laryngoscope Size: Mac and 4 Grade View: Grade I Tube type: Oral Tube size: 7.5 mm Number of attempts: 1 Airway Equipment and Method: Stylet and Oral airway Placement Confirmation: ETT inserted through vocal cords under direct vision,  positive ETCO2 and breath sounds checked- equal and bilateral Secured at: 23 cm Tube secured with: Tape Dental Injury: Teeth and Oropharynx as per pre-operative assessment

## 2020-03-16 NOTE — Progress Notes (Signed)
[facility] Progress Note  Patient: Micheal Watson    YNW:[id number]  DOA: 02/23/2020     Date of Service: the patient was seen and examined on 03/16/2020  Brief hospital course: 59 year old male with past medical history of alcohol abuse, recent admission for cholangitis SP CBD stent placement and JP drain placement presents with worsening abdominal pain nausea and a fever.  Found to have sepsis on admission secondary to SBP, bacteremia and hepatic abscess.  Also has biliary stricture.  Currently plan is to continue IV antibiotics. Currently plan is follow-up on the results of the biopsy and monitor for arrangement for rehab.  Assessment and Plan: 1.  Sepsis secondary to SBP and polymicrobial bacteremia, POA. Hepatic abscess seen on CT scan 7/26 Bacteremia SIRS criteria met on admission with tachypnea, fever, tachycardia and leukocytosis. Found to have hepatic abscess, SBP. Blood culture positive for Proteus pulmonary, Klebsiella and E. coli. Currently sepsis physiology resolved. SP paracentesis with fluid analysis consistent with SBP. SBP in the setting of recent abdominal surgery lap chole on November 22, 2019 as well as cholangitis. Initially on IV Zosyn followed by ceftriaxone Flagyl.  This was transitioned to oral Bactrim and Augmentin.  Currently on IV meropenem. Likely will de-escalate soon. Patient will require repeat liver CT or MRI to evaluate for the abscess. Infectious disease, general surgery, GI following the patient.  2.  Pancreatic mass Biliary stricture. SP ERCP and biliary stent placement with sphincterotomy. SP repeat ERCP on 03/16/2020 with metal stent placement. SP EUS on 03/16/2020 Appreciate [facility] assistance. EUS is showing moderate chronic pancreatitis mass in the pancreatic head with malignant appearing lymph nodes. Biopsy performed.  Results currently pending. LFT currently stable  3.  Alcohol abuse Alcoholic liver cirrhosis SBP Elevated  LFT Currently no evidence of decompensation. Also no evidence of withdrawal. Monitor.  Continue vitamins and supplements Currently no significant ascites on exam. Continue Aldactone and Lasix  4.  GERD Continue PPI  5.  Hyponatremia Likely hypervolemic in the setting of cirrhosis and alcohol abuse. Currently no focal deficit no confusion. Monitor.  6.  Vitamin B12 deficiency Relative vitamin B12 deficiency.  Continue supplementation.  7.  Hyperbilirubinemia Bilirubin normalizing after replacement of stent.  Monitor.  8.  Peripheral neuropathy. Continue gabapentin.  Diet: Cardiac diet DVT Prophylaxis: Resume Lovenox tomorrow on 03/17/2020 SCDs Start: 02/24/20 0934    Advance goals of care discussion: Full code  Family Communication: no family was present at bedside, at the time of interview.   Disposition:  Status is: Inpatient  Remains inpatient appropriate because:Ongoing diagnostic testing needed not appropriate for outpatient work up and Inpatient level of care appropriate due to severity of illness   Dispo:  Patient From: Home  Planned Disposition: SNF versus CIR  Expected discharge date: 03/18/2020  Medically stable for discharge: No  Subjective: No acute complaint.  Pain is improving in the leg.  No nausea no vomiting.  No fever no sleepiness. Spanish interpreter was used  Physical Exam:  General: Appear in mild distress, no Rash; Oral Mucosa Clear, moist. no Abnormal Neck Mass Or lumps, Conjunctiva normal  Cardiovascular: S1 and S2 Present, no Murmur, Respiratory: good respiratory effort, Bilateral Air entry present and Clear to Auscultation, no Crackles, no wheezes Abdomen: Bowel Sound present, Soft and no tenderness Extremities: trace Pedal edema, no calf tenderness Neurology: alert and oriented to time, place, and person affect appropriate. no new focal deficit Gait not checked due to patient safety concerns  Vitals:   03/16/20 1140 03/16/20  1145  03/16/20 1201 03/16/20 1500  BP: 134/86 (!) 103/51 (!) 142/82 118/74  Pulse: 85 85 85 82  Resp: '16 16 16 16  '$ Temp:   98.7 F (37.1 C) 98.6 F (37 C)  TempSrc:   Oral Axillary  SpO2: 97% 97% 95% 95%  Weight:      Height:        Intake/Output Summary (Last 24 hours) at 03/16/2020 1804 Last data filed at 03/16/2020 1505 Gross per 24 hour  Intake 1360 ml  Output 1050 ml  Net 310 ml   Filed Weights   02/27/20 0351 03/03/20 0439 03/16/20 0813  Weight: (!) 97 kg 91.8 kg 91.8 kg    Data Reviewed: I have personally reviewed and interpreted daily labs, tele strips, imagings as discussed above. I reviewed all nursing notes, pharmacy notes, vitals, pertinent old records I have discussed plan of care as described above with RN and patient/family.  CBC: Recent Labs  Lab 03/10/20 0823 03/11/20 0248 03/13/20 1022  WBC 11.4* 12.7* 10.0  NEUTROABS  --   --  6.9  HGB 13.7 13.3 13.8  HCT 40.8 39.2 40.7  MCV 96.0 94.5 95.3  PLT 366 358 379*   Basic Metabolic Panel: Recent Labs  Lab 03/12/20 0941 03/13/20 0653 03/14/20 0647 03/15/20 0840 03/16/20 0601  NA 130* 131* 131* 128* 130*  K 4.1 4.7 4.3 4.0 4.4  CL 95* 94* 94* 94* 94*  CO2 '23 27 25 24 25  '$ GLUCOSE 156* 118* 112* 149* 123*  BUN '11 10 10 9 11  '$ CREATININE 0.90 1.00 0.67 0.74 0.73  CALCIUM 9.0 9.0 9.0 8.9 8.9    Studies: No results found.  Scheduled Meds: . folic acid  1 mg Oral Daily  . furosemide  40 mg Oral Daily  . gabapentin  300 mg Oral TID  . Gerhardt's butt cream   Topical TID  . hydrocortisone  25 mg Rectal BID  . multivitamin with minerals  1 tablet Oral Daily  . pantoprazole  40 mg Oral Daily  . sodium chloride flush  3 mL Intravenous Q12H  . spironolactone  50 mg Oral Daily  . thiamine  100 mg Oral Daily   Continuous Infusions: . sodium chloride Stopped (03/06/20 0419)  . lactated ringers 10 mL/hr at 03/16/20 0915  . meropenem (MERREM) IV 1 g (03/16/20 1419)   PRN Meds: sodium chloride,  HYDROcodone-acetaminophen, [DISCONTINUED] ondansetron **OR** ondansetron (ZOFRAN) IV, phenol  Time spent: 35 minutes  Author: Berle Mull, MD [facility] 03/16/2020 6:04 PM  To reach On-call, see care teams to locate the attending and reach out via [email or web address]. Between 7PM-7AM, please contact night-coverage If you still have difficulty reaching the attending provider, please page the [facility] (Director on Call) for [facility] on amion for assistance.

## 2020-03-16 NOTE — Interval H&P Note (Signed)
History and Physical Interval Note:  03/16/2020 9:00 AM  Micheal Watson  has presented today for surgery, with the diagnosis of biliary stricture.  The various methods of treatment have been discussed with the patient and family. After consideration of risks, benefits and other options for treatment, the patient has consented to  Procedure(s): ESOPHAGOGASTRODUODENOSCOPY (EGD) WITH PROPOFOL (N/A) UPPER ESOPHAGEAL ENDOSCOPIC ULTRASOUND (EUS) (N/A) ENDOSCOPIC RETROGRADE CHOLANGIOPANCREATOGRAPHY (ERCP) WITH PROPOFOL (N/A) as a surgical intervention.  The patient's history has been reviewed, patient examined, no change in status, stable for surgery.  I have reviewed the patient's chart and labs.  Questions were answered to the patient's satisfaction.     Chancie Lampert M  SBP clinically resolving.  On antibiotics, no residual ascites for re-tap.  Plan for EUS + ERCP for reinvestigation of patient's biliary stricture.  Risks (bleeding, infection, bowel perforation that could require surgery, sedation-related changes in cardiopulmonary systems), benefits (identification and possible treatment of source of symptoms, exclusion of certain causes of symptoms), and alternatives (watchful waiting, radiographic imaging studies, empiric medical treatment) of upper endoscopy with ultrasound and possible fine needle aspiration (EUS +/- FNA) were explained to patient/family in detail and patient wishes to proceed.   Risks (up to and including bleeding, infection, perforation, pancreatitis that can be complicated by infected necrosis and death), benefits (removal of stones, alleviating blockage, decreasing risk of cholangitis or choledocholithiasis-related pancreatitis), and alternatives (watchful waiting, percutaneous transhepatic cholangiography) of ERCP were explained to patient/family in detail and patient elects to proceed.

## 2020-03-16 NOTE — Op Note (Signed)
[facility] Patient Name: Micheal Watson Procedure Date : 03/16/2020 MRN: [id number] Attending MD: Arta Silence , MD Date of Birth: [birth year] CSN: [id number] Age: 59 Admit Type: Inpatient Procedure:                Upper EUS Indications:              Common bile duct dilation (etiology unknown) seen                            on CT scan, Elevated liver enzymes Providers:                Arta Silence, MD, Elmer Ramp. Tilden Dome, RN, Burtis Junes,                            RN, Laverda Sorenson, Technician, Rejeana Brock, CRNA Referring MD:             [facility] Medicines:                General Anesthesia, Merrem IV (given 630 am this                            morning) Complications:            No immediate complications. Estimated Blood Loss:     Estimated blood loss: none. Procedure:                Pre-Anesthesia Assessment:                           - Prior to the procedure, a History and Physical                            was performed, and patient medications and                            allergies were reviewed. The patient's tolerance of                            previous anesthesia was also reviewed. The risks                            and benefits of the procedure and the sedation                            options and risks were discussed with the patient.                            All questions were answered, and informed consent                            was obtained. Prior Anticoagulants: The patient has                            taken no previous anticoagulant or antiplatelet  agents. ASA Grade Assessment: III - A patient with                            severe systemic disease. After reviewing the risks                            and benefits, the patient was deemed in                            satisfactory condition to undergo the procedure.                           After obtaining informed consent, the endoscope was                             passed under direct vision. Throughout the                            procedure, the patient's blood pressure, pulse, and                            oxygen saturations were monitored continuously. The                            [id number] ([id number]) Olympus Linear EUS was                            introduced through the mouth, and advanced to the                            second part of duodenum. The upper EUS was                            accomplished without difficulty. The patient                            tolerated the procedure well. Scope In: Scope Out: Findings:      ENDOSCOPIC FINDING: :      A previously placed plastic stent was seen in the ampulla. It was       removed by snare and sent in toto for cytology.      ENDOSONOGRAPHIC FINDING: :      There was dilation in the common bile duct and in the common hepatic       duct which measured up to 12 mm. There appeared to be tumor ingrowth       into the distal-most margin of the dilated bile duct.      A few malignant-appearing lymph nodes were visualized in the       peripancreatic region and porta hepatis region. The nodes were oval,       hypoechoic and had well defined margins.      Endosonographic imaging of the pancreas showed sonographic changes       indicative of moderate chronic pancreatitis in the pancreatic head, genu       of the pancreas, pancreatic body and pancreatic tail.  The parenchyma had       diffuse echogenicity, hyperechoic strands, hyperechoic foci and       lobularity. The pancreatic duct had hyperechoic walls.      A round mass was identified in the pancreatic head. The mass was       hypoechoic. The mass measured 20 mm by 23 mm in maximal cross-sectional       diameter. The outer margins were irregular. An intact interface was seen       between the mass and the adjacent structures suggesting a lack of       invasion. Fine needle aspiration for cytology was performed. Color        Doppler imaging was utilized prior to needle puncture to confirm a lack       of significant vascular structures within the needle path. Four passes       were made with the 25 gauge needle using a transduodenal approach. A       stylet was used. A cytotechnologist was present to evaluate the adequacy       of the specimen. Final cytology results are pending. Impression:               - Plastic stent in the duodenum. Removed, sent to                            pathology.                           - There was dilation in the common bile duct and in                            the common hepatic duct which measured up to 12 mm.                            Suspected tumor ingrowth seen in the distal-most                            margin of the dilated bile duct.                           - A few malignant-appearing lymph nodes were                            visualized in the peripancreatic region and porta                            hepatis region.                           - Endosonographic imaging of the pancreas showed                            sonographic changes consistent with moderate                            chronic pancreatitis.                           -  A mass was identified in the pancreatic head.                            This was staged T3 N1 Mx by endosonographic                            criteria. Fine needle aspiration performed. Moderate Sedation:      None Recommendation:           - Await cytology results.                           - Perform an ERCP today. Procedure Code(s):        --- Professional ---                           [phone], Esophagogastroduodenoscopy, flexible,                            transoral; with transendoscopic ultrasound-guided                            intramural or transmural fine needle                            aspiration/biopsy(s), (includes endoscopic                            ultrasound examination limited to the esophagus,                             stomach or duodenum, and adjacent structures) Diagnosis Code(s):        --- Professional ---                           I89.9, Noninfective disorder of lymphatic vessels                            and lymph nodes, unspecified                           K86.89, Other specified diseases of pancreas                           R74.8, Abnormal levels of other serum enzymes                           R93.3, Abnormal findings on diagnostic imaging of                            other parts of digestive tract                           K83.8, Other specified diseases of biliary tract                           R93.2, Abnormal findings on diagnostic imaging  of                            liver and biliary tract CPT copyright 2019 [employer]. All rights reserved. The codes documented in this report are preliminary and upon coder review may  be revised to meet current compliance requirements. Arta Silence, MD 03/16/2020 10:33:46 AM This report has been signed electronically. Number of Addenda: 0

## 2020-03-16 NOTE — Anesthesia Postprocedure Evaluation (Signed)
Anesthesia Post Note  Patient: Rayvion Resler  Procedure(s) Performed: ESOPHAGOGASTRODUODENOSCOPY (EGD) WITH PROPOFOL (N/A ) UPPER ESOPHAGEAL ENDOSCOPIC ULTRASOUND (EUS) (N/A ) FINE NEEDLE ASPIRATION (FNA) LINEAR STENT REMOVAL ENDOSCOPIC RETROGRADE CHOLANGIOPANCREATOGRAPHY (ERCP) WITH PROPOFOL (N/A )     Patient location during evaluation: PACU Anesthesia Type: General Level of consciousness: awake and alert Pain management: pain level controlled Vital Signs Assessment: post-procedure vital signs reviewed and stable Respiratory status: spontaneous breathing, nonlabored ventilation, respiratory function stable and patient connected to nasal cannula oxygen Cardiovascular status: blood pressure returned to baseline and stable Postop Assessment: no apparent nausea or vomiting Anesthetic complications: no   No complications documented.  Last Vitals:  Vitals:   03/16/20 1145 03/16/20 1201  BP: (!) 103/51 (!) 142/82  Pulse: 85 85  Resp: 16 16  Temp:  37.1 C  SpO2: 97% 95%    Last Pain:  Vitals:   03/16/20 1201  TempSrc: Oral  PainSc:                  Merlinda Frederick

## 2020-03-17 ENCOUNTER — Inpatient Hospital Stay (HOSPITAL_COMMUNITY): Payer: Self-pay

## 2020-03-17 DIAGNOSIS — I739 Peripheral vascular disease, unspecified: Secondary | ICD-10-CM

## 2020-03-17 LAB — COMPREHENSIVE METABOLIC PANEL
ALT: 53 U/L — ABNORMAL HIGH (ref 0–44)
AST: 59 U/L — ABNORMAL HIGH (ref 15–41)
Albumin: 2.9 g/dL — ABNORMAL LOW (ref 3.5–5.0)
Alkaline Phosphatase: 207 U/L — ABNORMAL HIGH (ref 38–126)
Anion gap: 15 (ref 5–15)
BUN: 10 mg/dL (ref 6–20)
CO2: 21 mmol/L — ABNORMAL LOW (ref 22–32)
Calcium: 9.3 mg/dL (ref 8.9–10.3)
Chloride: 93 mmol/L — ABNORMAL LOW (ref 98–111)
Creatinine, Ser: 0.83 mg/dL (ref 0.61–1.24)
GFR calc Af Amer: >60 mL/min (ref >60)
GFR calc non Af Amer: >60 mL/min (ref >60)
Glucose, Bld: 207 mg/dL — ABNORMAL HIGH (ref 70–99)
Potassium: 3.9 mmol/L (ref 3.5–5.1)
Sodium: 129 mmol/L — ABNORMAL LOW (ref 135–145)
Total Bilirubin: 1.4 mg/dL — ABNORMAL HIGH (ref 0.3–1.2)
Total Protein: 7.6 g/dL (ref 6.5–8.1)

## 2020-03-17 LAB — CYTOLOGY - NON PAP

## 2020-03-17 LAB — SURGICAL PATHOLOGY

## 2020-03-17 LAB — CBC WITH DIFFERENTIAL/PLATELET
Abs Immature Granulocytes: 0.07 10*3/uL (ref 0.00–0.07)
Basophils Absolute: 0 10*3/uL (ref 0.0–0.1)
Basophils Relative: 0 %
Eosinophils Absolute: 0 10*3/uL (ref 0.0–0.5)
Eosinophils Relative: 0 %
HCT: 40.7 % (ref 39.0–52.0)
Hemoglobin: 13.6 g/dL (ref 13.0–17.0)
Immature Granulocytes: 1 %
Lymphocytes Relative: 13 %
Lymphs Abs: 1.9 10*3/uL (ref 0.7–4.0)
MCH: 32.2 pg (ref 26.0–34.0)
MCHC: 33.4 g/dL (ref 30.0–36.0)
MCV: 96.4 fL (ref 80.0–100.0)
Monocytes Absolute: 0.8 10*3/uL (ref 0.1–1.0)
Monocytes Relative: 5 %
Neutro Abs: 12.3 10*3/uL — ABNORMAL HIGH (ref 1.7–7.7)
Neutrophils Relative %: 81 %
Platelets: 478 10*3/uL — ABNORMAL HIGH (ref 150–400)
RBC: 4.22 MIL/uL (ref 4.22–5.81)
RDW: 13.1 % (ref 11.5–15.5)
WBC: 15 10*3/uL — ABNORMAL HIGH (ref 4.0–10.5)
nRBC: 0 % (ref 0.0–0.2)

## 2020-03-17 MED ORDER — ENOXAPARIN SODIUM 40 MG/0.4ML ~~LOC~~ SOLN
40.0000 mg | Freq: Every day | SUBCUTANEOUS | Status: DC
  Administered 2020-03-17 – 2020-03-20 (×4): 40 mg via SUBCUTANEOUS
  Filled 2020-03-17 (×5): qty 0.4

## 2020-03-17 MED ORDER — AMOXICILLIN-POT CLAVULANATE 875-125 MG PO TABS
1.0000 | ORAL_TABLET | Freq: Two times a day (BID) | ORAL | Status: DC
  Administered 2020-03-17 – 2020-03-20 (×7): 1 via ORAL
  Filled 2020-03-17 (×7): qty 1

## 2020-03-17 MED ORDER — VITAMIN B-12 1000 MCG PO TABS
1000.0000 ug | ORAL_TABLET | Freq: Every day | ORAL | Status: DC
  Administered 2020-03-17 – 2020-03-20 (×4): 1000 ug via ORAL
  Filled 2020-03-17 (×4): qty 1

## 2020-03-17 MED ORDER — SULFAMETHOXAZOLE-TRIMETHOPRIM 800-160 MG PO TABS
1.0000 | ORAL_TABLET | Freq: Two times a day (BID) | ORAL | Status: DC
  Administered 2020-03-17 – 2020-03-20 (×7): 1 via ORAL
  Filled 2020-03-17 (×7): qty 1

## 2020-03-17 NOTE — Progress Notes (Signed)
ABI's have been completed. Preliminary results can be found in CV Proc through chart review.   03/17/20 12:31 PM Micheal Watson RVT

## 2020-03-17 NOTE — Progress Notes (Signed)
[facility] Progress Note  Patient: Micheal Watson    IZT:[id number]  DOA: 02/23/2020     Date of Service: the patient was seen and examined on 03/17/2020  Brief hospital course: 59 year old male with past medical history of alcohol abuse, recent admission for cholangitis SP CBD stent placement and JP drain placement presents with worsening abdominal pain nausea and a fever.  Found to have sepsis on admission secondary to SBP, bacteremia and hepatic abscess.  Also has biliary stricture.  Currently plan is to continue IV antibiotics. Currently plan is follow-up on the results of the biopsy and monitor for arrangement for rehab.  Assessment and Plan: 1.  Sepsis secondary to SBP and polymicrobial bacteremia, POA. Hepatic abscess seen on CT scan 7/26 Bacteremia SIRS criteria met on admission with tachypnea, fever, tachycardia and leukocytosis. Found to have hepatic abscess, SBP. Blood culture positive for Proteus pulmonary, Klebsiella and E. coli. Currently sepsis physiology resolved. SP paracentesis with fluid analysis consistent with SBP. SBP in the setting of recent abdominal surgery lap chole on November 22, 2019 as well as cholangitis. Initially on IV Zosyn followed by ceftriaxone Flagyl.  This was transitioned to oral Bactrim and Augmentin.  Currently on IV meropenem. Change back to Augmentin and Bactrim.  Will discuss with ID regarding duration. Patient will require repeat liver CT or MRI to evaluate for the abscess. Infectious disease, general surgery, GI following the patient.  2.  Pancreatic mass Biliary stricture. SP ERCP and biliary stent placement with sphincterotomy. SP repeat ERCP on 03/16/2020 with metal stent placement. SP EUS on 03/16/2020 Appreciate [facility] assistance. EUS is showing moderate chronic pancreatitis mass in the pancreatic head with malignant appearing lymph nodes. Biopsy performed.  Results currently pending. LFT currently stable  3.  Alcohol  abuse Alcoholic liver cirrhosis SBP Elevated LFT Currently no evidence of decompensation. Also no evidence of withdrawal. Monitor.  Continue vitamins and supplements Currently no significant ascites on exam. Continue Aldactone and Lasix  4.  GERD Continue PPI  5.  Hyponatremia Likely hypervolemic in the setting of cirrhosis and alcohol abuse. Currently no focal deficit no confusion. Monitor.  6.  Vitamin B12 deficiency Relative vitamin B12 deficiency.  Continue supplementation.  7.  Hyperbilirubinemia Bilirubin normalizing after replacement of stent.  Monitor.  8.  Peripheral neuropathy. Continue gabapentin.  Diet: Cardiac diet DVT Prophylaxis:  enoxaparin (LOVENOX) injection 40 mg Start: 03/17/20 2030 SCDs Start: 02/24/20 0934   Advance goals of care discussion: Full code  Family Communication: no family was present at bedside, at the time of interview.   Disposition:  Status is: Inpatient  Remains inpatient appropriate because:Ongoing diagnostic testing needed not appropriate for outpatient work up and Inpatient level of care appropriate due to severity of illness   Dispo:  Patient From: Home  Planned Disposition: SNF versus CIR  Expected discharge date: 03/18/2020  Medically stable for discharge: No  Subjective: No acute complaint no nausea no vomiting.  No fever no chills.  Pain in the leg improving.  Spanish interpreter was used  Physical Exam:  General: Appear in mild distress, no Rash; Oral Mucosa Clear, moist. no Abnormal Neck Mass Or lumps, Conjunctiva normal  Cardiovascular: S1 and S2 Present, no Murmur, Respiratory: good respiratory effort, Bilateral Air entry present and Clear to Auscultation, no Crackles, no wheezes Abdomen: Bowel Sound present, Soft and no tenderness Extremities: trace Pedal edema, no calf tenderness Neurology: alert and oriented to time, place, and person affect appropriate. no new focal deficit Gait not checked due  to  patient safety concerns  Vitals:   03/16/20 1500 03/16/20 2126 03/17/20 0453 03/17/20 1315  BP: 118/74 117/77 113/70 126/82  Pulse: 82 74 73 70  Resp: '16 16 18 18  '$ Temp: 98.6 F (37 C) 98.2 F (36.8 C) 98.3 F (36.8 C) 98.8 F (37.1 C)  TempSrc: Axillary Oral Oral Oral  SpO2: 95% 95% 96% 97%  Weight:      Height:        Intake/Output Summary (Last 24 hours) at 03/17/2020 2017 Last data filed at 03/17/2020 1800 Gross per 24 hour  Intake 840 ml  Output 2225 ml  Net -1385 ml   Filed Weights   02/27/20 0351 03/03/20 0439 03/16/20 0813  Weight: (!) 97 kg 91.8 kg 91.8 kg    Data Reviewed: I have personally reviewed and interpreted daily labs, tele strips, imagings as discussed above. I reviewed all nursing notes, pharmacy notes, vitals, pertinent old records I have discussed plan of care as described above with RN and patient/family.  CBC: Recent Labs  Lab 03/11/20 0248 03/13/20 1022 03/17/20 0912  WBC 12.7* 10.0 15.0*  NEUTROABS  --  6.9 12.3*  HGB 13.3 13.8 13.6  HCT 39.2 40.7 40.7  MCV 94.5 95.3 96.4  PLT 358 408* 553*   Basic Metabolic Panel: Recent Labs  Lab 03/13/20 0653 03/14/20 0647 03/15/20 0840 03/16/20 0601 03/17/20 0912  NA 131* 131* 128* 130* 129*  K 4.7 4.3 4.0 4.4 3.9  CL 94* 94* 94* 94* 93*  CO2 '27 25 24 25 '$ 21*  GLUCOSE 118* 112* 149* 123* 207*  BUN '10 10 9 11 10  '$ CREATININE 1.00 0.67 0.74 0.73 0.83  CALCIUM 9.0 9.0 8.9 8.9 9.3    Studies: VAS [place] ABI WITH/WO TBI  Result Date: 03/17/2020 LOWER EXTREMITY DOPPLER STUDY Indications: Claudication. High Risk Factors: Hypertension.  Comparison Study: No prior studies. Performing Technologist: Carlos Levering RVT  Examination Guidelines: A complete evaluation includes at minimum, Doppler waveform signals and systolic blood pressure reading at the level of bilateral brachial, anterior tibial, and posterior tibial arteries, when vessel segments are accessible. Bilateral testing is considered an  integral part of a complete examination. Photoelectric Plethysmograph (PPG) waveforms and toe systolic pressure readings are included as required and additional duplex testing as needed. Limited examinations for reoccurring indications may be performed as noted.  ABI Findings: +--------+------------------+-----+---------+--------+  Right    Rt Pressure (mmHg) Index Waveform  Comment   +--------+------------------+-----+---------+--------+  Brachial 127                      triphasic           +--------+------------------+-----+---------+--------+  PTA      160                1.26  triphasic           +--------+------------------+-----+---------+--------+  DP       162                1.28  triphasic           +--------+------------------+-----+---------+--------+ +--------+------------------+-----+---------+-------+  Left     Lt Pressure (mmHg) Index Waveform  Comment  +--------+------------------+-----+---------+-------+  Brachial 125                      triphasic          +--------+------------------+-----+---------+-------+  PTA      149  1.17  triphasic          +--------+------------------+-----+---------+-------+  DP       154                1.21  triphasic          +--------+------------------+-----+---------+-------+ +-------+-----------+-----------+------------+------------+  ABI/TBI Today's ABI Today's TBI Previous ABI Previous TBI  +-------+-----------+-----------+------------+------------+  Right   1.28                                               +-------+-----------+-----------+------------+------------+  Left    1.21                                               +-------+-----------+-----------+------------+------------+  Summary: Right: Resting right ankle-brachial index is within normal range. No evidence of significant right lower extremity arterial disease. Left: Resting left ankle-brachial index is within normal range. No evidence of significant left lower extremity arterial  disease.  *See table(s) above for measurements and observations.    Preliminary     Scheduled Meds:  amoxicillin-clavulanate  1 tablet Oral Z61W   folic acid  1 mg Oral Daily   furosemide  40 mg Oral Daily   gabapentin  300 mg Oral TID   Gerhardt's butt cream   Topical TID   hydrocortisone  25 mg Rectal BID   multivitamin with minerals  1 tablet Oral Daily   pantoprazole  40 mg Oral Daily   sodium chloride flush  3 mL Intravenous Q12H   spironolactone  50 mg Oral Daily   sulfamethoxazole-trimethoprim  1 tablet Oral Q12H   thiamine  100 mg Oral Daily   vitamin B-12  1,000 mcg Oral Daily   Continuous Infusions:  sodium chloride Stopped (03/06/20 0419)   lactated ringers 10 mL/hr at 03/16/20 0915   PRN Meds: sodium chloride, HYDROcodone-acetaminophen, [DISCONTINUED] ondansetron **OR** ondansetron (ZOFRAN) IV, phenol  Time spent: 35 minutes  Author: Berle Mull, MD [facility] 03/17/2020 8:17 PM  To reach On-call, see care teams to locate the attending and reach out via [email or web address]. Between 7PM-7AM, please contact night-coverage If you still have difficulty reaching the attending provider, please page the [facility] (Director on Call) for [facility] on amion for assistance.

## 2020-03-17 NOTE — Progress Notes (Signed)
[facility] Progress Note  Micheal Watson 59 y.o. 1961-03-30  CC: Abdominal pain, bacteremia, SBP, pancreatic mass, biliary stricture   Subjective:  Interpreter software used for history taking.  Feeling much better today.  Tolerated procedures well yesterday.  Denies acute GI issues.  Denies abdominal pain, nausea and vomiting.   ROS : Afebrile, negative for chest pain   Objective: Vital signs in last 24 hours: Vitals:   03/16/20 2126 03/17/20 0453  BP: 117/77 113/70  Pulse: 74 73  Resp: 16 18  Temp: 98.2 F (36.8 C) 98.3 F (36.8 C)  SpO2: 95% 96%    Physical Exam:  General:  Alert, cooperative, no distress, appears stated age  Head:  Normocephalic, without obvious abnormality, atraumatic  Eyes:  , EOM's intact,   Lungs:    No visible respiratory distress noted  Heart:  Regular rate and rhythm, S1, S2 normal  Abdomen:    Abdomen is soft, nontender, nondistended, bowel sounds present.  No peritoneal signs  Extremities: Extremities normal, atraumatic, no  edema       Lab Results: Recent Labs    03/16/20 0601 03/17/20 0912  NA 130* 129*  K 4.4 3.9  CL 94* 93*  CO2 25 21*  GLUCOSE 123* 207*  BUN 11 10  CREATININE 0.73 0.83  CALCIUM 8.9 9.3   Recent Labs    03/16/20 0601 03/17/20 0912  AST 34 59*  ALT 29 53*  ALKPHOS 145* 207*  BILITOT 1.5* 1.4*  PROT 7.0 7.6  ALBUMIN 2.7* 2.9*   Recent Labs    03/17/20 0912  WBC 15.0*  NEUTROABS 12.3*  HGB 13.6  HCT 40.7  MCV 96.4  PLT 478*   Recent Labs    03/16/20 0601  LABPROT 14.8  INR 1.2      Assessment/Plan: -Pancreatic head mass with biliary stricture with EUS showing possible tumor ingrowth into the common bile duct.  Malignant appearing lymph nodes also seen.  ERCP with 10 French by 6 cm fully covered metal stent placement 03/16/2020  Elevated LFTs.    Multifactorial from cirrhosis along with alcohol use and history of biliary stricture. -Polymicrobial bacteremia along with  SBP. -History of laparoscopic cholecystectomy with postoperative bile leak in April 2021.  Follow-up ERCP with stent placement on November 29, 2019.  Had diagnostic laparoscopy in May 2021.  Another ERCP on February 24, 2020-previously placed stent was partially occluded which was removed.   Another 10 French 7 cm plastic stent was placed.  Cytology showed atypical cells.  Recommendations ----------------------- -Findings of possible pancreatic cancer discussed with the patient.  All questions answered.  Awaiting final pathology reports.  He will need surgery and oncology consultation. -Slowly advance diet to regular as tolerated -Continue other management -GI will see him back next week.  Call us back if needed during this weekend.   Otis Brace MD, [place] 03/17/2020, 10:23 AM  Contact #  [phone]

## 2020-03-18 ENCOUNTER — Inpatient Hospital Stay (HOSPITAL_COMMUNITY): Payer: Self-pay

## 2020-03-18 LAB — COMPREHENSIVE METABOLIC PANEL
ALT: 48 U/L — ABNORMAL HIGH (ref 0–44)
AST: 46 U/L — ABNORMAL HIGH (ref 15–41)
Albumin: 2.8 g/dL — ABNORMAL LOW (ref 3.5–5.0)
Alkaline Phosphatase: 176 U/L — ABNORMAL HIGH (ref 38–126)
Anion gap: 10 (ref 5–15)
BUN: 13 mg/dL (ref 6–20)
CO2: 27 mmol/L (ref 22–32)
Calcium: 9 mg/dL (ref 8.9–10.3)
Chloride: 96 mmol/L — ABNORMAL LOW (ref 98–111)
Creatinine, Ser: 0.86 mg/dL (ref 0.61–1.24)
GFR calc Af Amer: >60 mL/min (ref >60)
GFR calc non Af Amer: >60 mL/min (ref >60)
Glucose, Bld: 112 mg/dL — ABNORMAL HIGH (ref 70–99)
Potassium: 4.1 mmol/L (ref 3.5–5.1)
Sodium: 133 mmol/L — ABNORMAL LOW (ref 135–145)
Total Bilirubin: 1.3 mg/dL — ABNORMAL HIGH (ref 0.3–1.2)
Total Protein: 7.4 g/dL (ref 6.5–8.1)

## 2020-03-18 LAB — CBC WITH DIFFERENTIAL/PLATELET
Abs Immature Granulocytes: 0.06 10*3/uL (ref 0.00–0.07)
Basophils Absolute: 0.1 10*3/uL (ref 0.0–0.1)
Basophils Relative: 1 %
Eosinophils Absolute: 0 10*3/uL (ref 0.0–0.5)
Eosinophils Relative: 0 %
HCT: 42.6 % (ref 39.0–52.0)
Hemoglobin: 14.4 g/dL (ref 13.0–17.0)
Immature Granulocytes: 1 %
Lymphocytes Relative: 18 %
Lymphs Abs: 2.1 10*3/uL (ref 0.7–4.0)
MCH: 32.4 pg (ref 26.0–34.0)
MCHC: 33.8 g/dL (ref 30.0–36.0)
MCV: 95.7 fL (ref 80.0–100.0)
Monocytes Absolute: 1.4 10*3/uL — ABNORMAL HIGH (ref 0.1–1.0)
Monocytes Relative: 12 %
Neutro Abs: 8.2 10*3/uL — ABNORMAL HIGH (ref 1.7–7.7)
Neutrophils Relative %: 68 %
Platelets: 501 10*3/uL — ABNORMAL HIGH (ref 150–400)
RBC: 4.45 MIL/uL (ref 4.22–5.81)
RDW: 13.1 % (ref 11.5–15.5)
WBC: 11.8 10*3/uL — ABNORMAL HIGH (ref 4.0–10.5)
nRBC: 0.2 % (ref 0.0–0.2)

## 2020-03-18 LAB — MAGNESIUM: Magnesium: 2 mg/dL (ref 1.7–2.4)

## 2020-03-18 MED ORDER — IOHEXOL 9 MG/ML PO SOLN
500.0000 mL | ORAL | Status: AC
  Administered 2020-03-18 (×2): 500 mL via ORAL

## 2020-03-18 MED ORDER — IOHEXOL 300 MG/ML  SOLN
100.0000 mL | Freq: Once | INTRAMUSCULAR | Status: AC | PRN
  Administered 2020-03-18: 100 mL via INTRAVENOUS

## 2020-03-18 NOTE — Progress Notes (Addendum)
[facility] Progress Note  Patient: Micheal Watson    OJJ:[id number]  DOA: 02/23/2020     Date of Service: the patient was seen and examined on 03/18/2020  Brief hospital course: 59 year old male with past medical history of alcohol abuse, recent admission for cholangitis SP CBD stent placement and JP drain placement presents with worsening abdominal pain nausea and a fever.  Found to have sepsis on admission secondary to SBP, bacteremia and hepatic abscess.  Also has biliary stricture.  Currently plan is to continue IV antibiotics. Currently plan is monitor for improvement in oral intake and PT eval  Assessment and Plan: 1.  Sepsis secondary to SBP and polymicrobial bacteremia, POA. Hepatic abscess seen on CT scan 7/26 Bacteremia SIRS criteria met on admission with tachypnea, fever, tachycardia and leukocytosis. Found to have hepatic abscess, SBP. Blood culture positive for Proteus pulmonary, Klebsiella and E. coli. Currently sepsis physiology resolved. SP paracentesis with fluid analysis consistent with SBP. SBP in the setting of recent abdominal surgery lap chole on November 22, 2019 as well as cholangitis. Initially on IV Zosyn followed by ceftriaxone Flagyl.  This was transitioned to oral Bactrim and Augmentin.  Briefly on IV meropenem. Change back to Augmentin and Bactrim.  Will discuss with ID regarding duration. CT scan on 03/18/2020 shows resolving infection. Infectious disease, general surgery, GI following the patient.  2.  Pancreatic mass adenocarcinoma Biliary stricture. SP ERCP and biliary stent placement with sphincterotomy. SP repeat ERCP on 03/16/2020 with metal stent placement. SP EUS on 03/16/2020 Adenocarcinoma of the pancreatic head Appreciate [facility] assistance. EUS is showing moderate chronic pancreatitis mass in the pancreatic head with malignant appearing lymph nodes. Biopsy performed.  Results shows adenocarcinoma but also discussed with Dr. Julien Nordmann on  call. He will arrange for outpatient follow-up with the patient. LFT currently stable  3.  Alcohol abuse Alcoholic liver cirrhosis SBP Elevated LFT Currently no evidence of decompensation. Also no evidence of withdrawal. Monitor.  Continue vitamins and supplements Currently no significant ascites on exam. Continue Aldactone and Lasix  4.  GERD Continue PPI  5.  Hyponatremia Likely hypervolemic in the setting of cirrhosis and alcohol abuse. Currently no focal deficit no confusion. Monitor.  6.  Vitamin B12 deficiency Relative vitamin B12 deficiency.  Continue supplementation.  7.  Hyperbilirubinemia Bilirubin normalizing after replacement of stent.  Monitor.  8.  Peripheral neuropathy. Continue gabapentin.  9.  Right-sided right upper quadrant abdominal pain. Repeat CT scan does not show any evidence of acute abnormality. Currently pain control.  Diet: Cardiac diet DVT Prophylaxis:  enoxaparin (LOVENOX) injection 40 mg Start: 03/17/20 2030 SCDs Start: 02/24/20 0934   Advance goals of care discussion: Full code  Family Communication: no family was present at bedside, at the time of interview.   Disposition:  Status is: Inpatient  Remains inpatient appropriate because:Ongoing diagnostic testing needed not appropriate for outpatient work up and Inpatient level of care appropriate due to severity of illness   Dispo:  Patient From: Home  Planned Disposition: SNF versus CIR  Expected discharge date: 03/18/2020  Medically stable for discharge: No  Subjective: Reports right upper quadrant pain.  No nausea no vomiting.  No fever no chills.  Passing gas.  Spanish interpreter was used  Physical Exam:  General: Appear in mild distress, no Rash; Oral Mucosa Clear, moist. no Abnormal Neck Mass Or lumps, Conjunctiva normal  Cardiovascular: S1 and S2 Present, no Murmur, Respiratory: good respiratory effort, Bilateral Air entry present and Clear to Auscultation, no  Crackles, no wheezes Abdomen: Bowel Sound present, Soft and right-sided tenderness Extremities: trace Pedal edema, no calf tenderness Neurology: alert and oriented to time, place, and person affect appropriate. no new focal deficit Gait not checked due to patient safety concerns  Vitals:   03/17/20 1315 03/17/20 2035 03/18/20 0501 03/18/20 1500  BP: 126/82 129/82 123/84 121/81  Pulse: 70 76 78 82  Resp: '18 16 18 18  '$ Temp: 98.8 F (37.1 C) 98.7 F (37.1 C) 98.8 F (37.1 C) 97.7 F (36.5 C)  TempSrc: Oral Oral Oral Oral  SpO2: 97% 96% 97% 96%  Weight:      Height:        Intake/Output Summary (Last 24 hours) at 03/18/2020 1943 Last data filed at 03/18/2020 1330 Gross per 24 hour  Intake 720 ml  Output --  Net 720 ml   Filed Weights   02/27/20 0351 03/03/20 0439 03/16/20 0813  Weight: (!) 97 kg 91.8 kg 91.8 kg    Data Reviewed: I have personally reviewed and interpreted daily labs, tele strips, imagings as discussed above. I reviewed all nursing notes, pharmacy notes, vitals, pertinent old records I have discussed plan of care as described above with RN and patient/family.  CBC: Recent Labs  Lab 03/13/20 1022 03/17/20 0912 03/18/20 0832  WBC 10.0 15.0* 11.8*  NEUTROABS 6.9 12.3* 8.2*  HGB 13.8 13.6 14.4  HCT 40.7 40.7 42.6  MCV 95.3 96.4 95.7  PLT 408* 478* 409*   Basic Metabolic Panel: Recent Labs  Lab 03/14/20 0647 03/15/20 0840 03/16/20 0601 03/17/20 0912 03/18/20 0832  NA 131* 128* 130* 129* 133*  K 4.3 4.0 4.4 3.9 4.1  CL 94* 94* 94* 93* 96*  CO2 '25 24 25 '$ 21* 27  GLUCOSE 112* 149* 123* 207* 112*  BUN '10 9 11 10 13  '$ CREATININE 8.11 0.74 0.73 0.83 0.86  CALCIUM 9.0 8.9 8.9 9.3 9.0  MG  --   --   --   --  2.0    Studies: CT ABDOMEN PELVIS W CONTRAST  Result Date: 03/18/2020 CLINICAL DATA:  Possible intra-abdominal abscess. Recent admission for cholangitis post CBD stent placement and JP drain. Worsening abdominal pain, nausea and fever. EXAM: CT  ABDOMEN AND PELVIS WITH CONTRAST TECHNIQUE: Multidetector CT imaging of the abdomen and pelvis was performed using the standard protocol following bolus administration of intravenous contrast. CONTRAST:  18m OMNIPAQUE IOHEXOL 300 MG/ML  SOLN COMPARISON:  03/08/2020 and 02/28/2020 FINDINGS: Lower chest: Lung bases are clear. Hepatobiliary: Previous cholecystectomy. Evidence of patient's known cirrhosis. Common bile duct stent remains in place and unchanged. Mild pneumobilia over the left lobe of the liver centrally. Subtle low-density 1.2 cm focus over the inferior tip of the right lobe of the liver without significant change. Single adjacent subcentimeter hypodensity over the inferior right lobe of the liver without significant change. No significant change from recent prior exam from 03/08/2020 although several resolved subcentimeter hypodensities over the inferior right lobe of the liver compared to 02/28/2020. This likely represents resolving infection. Pancreas: Within normal. Spleen: Normal. Adrenals/Urinary Tract: Adrenal glands are normal. Kidneys are normal in size without hydronephrosis or nephrolithiasis. Ureters and bladder are normal. Stomach/Bowel: Stomach is normal. Small bowel is unremarkable. Appendix is normal. Minimal colonic diverticulosis. Vascular/Lymphatic: Abdominal aorta is normal caliber. Subtle calcified plaque over the proximal abdominal aorta. Remaining vascular structures are unremarkable. No adenopathy. Reproductive: Normal. Other: Mild ascites with slight overall improvement. Musculoskeletal: Degenerative change of the spine and hips. IMPRESSION: 1. Evidence of patient's  known cirrhosis. Mild ascites with slight overall improvement. Common bile duct stent remains in place and unchanged. 2. Two small low-density foci over the inferior right lobe of the liver without significant change from recent prior exam from 03/08/2020, although several resolved subcentimeter hypodensities over  the inferior right lobe of the liver compared to 02/28/2020. This likely represents resolving infection. 3. Minimal colonic diverticulosis. 4. Aortic atherosclerosis. Aortic Atherosclerosis (ICD10-I70.0). Electronically Signed   By: Marin Olp M.D.   On: 03/18/2020 18:57    Scheduled Meds: . amoxicillin-clavulanate  1 tablet Oral Q12H  . enoxaparin (LOVENOX) injection  40 mg Subcutaneous Daily  . folic acid  1 mg Oral Daily  . furosemide  40 mg Oral Daily  . gabapentin  300 mg Oral TID  . Gerhardt's butt cream   Topical TID  . hydrocortisone  25 mg Rectal BID  . multivitamin with minerals  1 tablet Oral Daily  . pantoprazole  40 mg Oral Daily  . sodium chloride flush  3 mL Intravenous Q12H  . spironolactone  50 mg Oral Daily  . sulfamethoxazole-trimethoprim  1 tablet Oral Q12H  . thiamine  100 mg Oral Daily  . vitamin B-12  1,000 mcg Oral Daily   Continuous Infusions: . sodium chloride Stopped (03/06/20 0419)  . lactated ringers 10 mL/hr at 03/16/20 0915   PRN Meds: sodium chloride, HYDROcodone-acetaminophen, [DISCONTINUED] ondansetron **OR** ondansetron (ZOFRAN) IV, phenol  Time spent: 35 minutes  Author: Berle Mull, MD [facility] 03/18/2020 7:42 PM  To reach On-call, see care teams to locate the attending and reach out via [email or web address]. Between 7PM-7AM, please contact night-coverage If you still have difficulty reaching the attending provider, please page the [facility] (Director on Call) for [facility] on amion for assistance.

## 2020-03-18 NOTE — Progress Notes (Signed)
Pt transported to CT at this time. He drank oral contrast without difficulty. Used interpreter several times throughout the day to ensure patients understanding of plan of care. Pt denies any pain or discomfort. Denies any needs or questions.

## 2020-03-19 MED ORDER — POLYETHYLENE GLYCOL 3350 17 G PO PACK
17.0000 g | PACK | Freq: Every day | ORAL | Status: DC
  Administered 2020-03-20: 17 g via ORAL
  Filled 2020-03-19: qty 1

## 2020-03-19 NOTE — Evaluation (Signed)
Physical Therapy Re-Evaluation and Discharge Patient Details Name: Micheal Watson MRN: [id number] DOB: [birth year] Today's Date: 03/19/2020   History of Present Illness  Micheal Watson is a 59 y.o. male with medical history significant of ETOH dependence; recent acute cholecystitis/cholangitis s/p lap chole w/CBD stent and JP drain placement 4-12/2019 (removed since).  He is now presenting with recurrent LLQ abdomnial pain.  Now s/p ERCP 7/22 & paracentesis 7/23.  Clinical Impression   Patient evaluated by Physical Therapy with no further acute PT needs identified. All education has been completed and the patient has no further questions. Walked the entire unit without difficulty;  See below for any follow-up Physical Therapy or equipment needs. PT is signing off. Thank you for this referral.     Follow Up Recommendations No PT follow up    Equipment Recommendations  None recommended by PT    Recommendations for Other Services Other (comment) (TOC to connect with transportation options, resources to help with compliance for outpt follow up)     Precautions / Restrictions Precautions Precautions: None      Mobility  Bed Mobility Overal bed mobility: Independent                Transfers Overall transfer level: Independent                  Ambulation/Gait Ambulation/Gait assistance: Independent Gait Distance (Feet): 550 Feet Assistive device: None Gait Pattern/deviations: WFL(Within Functional Limits)     General Gait Details: No difficulty  Stairs            Wheelchair Mobility    Modified Rankin (Stroke Patients Only)       Balance     Sitting balance-Leahy Scale: Good       Standing balance-Leahy Scale: Good                               Pertinent Vitals/Pain Pain Assessment: No/denies pain Pain Intervention(s): Monitored during session    Home Living Family/patient expects to be discharged to:: Private  residence Living Arrangements: Non-relatives/Friends Available Help at Discharge: Friend(s);Available PRN/intermittently Type of Home: House Home Access: Stairs to enter   Entrance Stairs-Number of Steps: 1 Home Layout: One level Home Equipment: None      Prior Function Level of Independence: Independent         Comments: working odd jobs     Journalist, newspaper        Extremity/Trunk Assessment   Upper Extremity Assessment Upper Extremity Assessment: Overall WFL for tasks assessed    Lower Extremity Assessment Lower Extremity Assessment: Overall WFL for tasks assessed       Communication   Communication: Prefers language other than [place] (Spanish; [place], Med Interpreter assisted with com [phone])  Cognition Arousal/Alertness: Awake/alert Behavior During Therapy: WFL for tasks assessed/performed Overall Cognitive Status: Within Functional Limits for tasks assessed                                        General Comments      Exercises     Assessment/Plan    PT Assessment Patent does not need any further PT services  PT Problem List         PT Treatment Interventions      PT Goals (Current goals can be found in the Care Plan section)  Acute Rehab  PT Goals Patient Stated Goal: Home soon PT Goal Formulation: All assessment and education complete, DC therapy    Frequency     Barriers to discharge        Co-evaluation               AM-PAC PT "6 Clicks" Mobility  Outcome Measure Help needed turning from your back to your side while in a flat bed without using bedrails?: None Help needed moving from lying on your back to sitting on the side of a flat bed without using bedrails?: None Help needed moving to and from a bed to a chair (including a wheelchair)?: None Help needed standing up from a chair using your arms (e.g., wheelchair or bedside chair)?: None Help needed to walk in hospital room?: None Help needed climbing 3-5 steps  with a railing? : None 6 Click Score: 24    End of Session   Activity Tolerance: Patient tolerated treatment well Patient left: in bed;with call bell/phone within reach (managing independently in room) Nurse Communication: Mobility status PT Visit Diagnosis: Other abnormalities of gait and mobility (R26.89)    Time: 4239-5320 PT Time Calculation (min) (ACUTE ONLY): 26 min   Charges:   PT Evaluation $PT Re-evaluation: 1 Re-eval PT Treatments $Gait Training: 8-22 mins        Roney Marion, PT  Acute Rehabilitation Services Pager [phone] Office [place] 03/19/2020, 4:47 PM

## 2020-03-19 NOTE — Plan of Care (Signed)
  Problem: Education: Goal: Knowledge of General Education information will improve Description Including pain rating scale, medication(s)/side effects and non-pharmacologic comfort measures Outcome: Progressing   

## 2020-03-19 NOTE — Progress Notes (Signed)
[facility] Progress Note  Patient: Micheal Watson    TTS:[id number]  DOA: 02/23/2020     Date of Service: the patient was seen and examined on 03/19/2020  Brief hospital course: 59 year old male with past medical history of alcohol abuse, recent admission for cholangitis SP CBD stent placement and JP drain placement presents with worsening abdominal pain nausea and a fever.  Found to have sepsis on admission secondary to SBP, bacteremia and hepatic abscess.  Also has biliary stricture.  Currently plan is to continue IV antibiotics. Currently plan is continue to monitor for improvement in oral intake and arrange for safe discharge.  Assessment and Plan: 1.  Sepsis secondary to SBP and polymicrobial bacteremia, POA. Hepatic abscess seen on CT scan 7/26 Bacteremia SIRS criteria met on admission with tachypnea, fever, tachycardia and leukocytosis. Found to have hepatic abscess, SBP. Blood culture positive for Proteus pulmonary, Klebsiella and E. coli. Currently sepsis physiology resolved. SP paracentesis with fluid analysis consistent with SBP. SBP in the setting of recent abdominal surgery lap chole on November 22, 2019 as well as cholangitis. Initially on IV Zosyn followed by ceftriaxone Flagyl.  This was transitioned to oral Bactrim and Augmentin.  Briefly on IV meropenem. Change back to Augmentin and Bactrim.   Discussed with ID. Patient will require follow-up with ID at a repeat CT scan. We will continue antibiotic on discharge for at least 2 weeks. CT scan on 03/18/2020 shows resolving infection. Infectious disease, general surgery, GI following the patient.  2.  Pancreatic mass adenocarcinoma Biliary stricture. SP ERCP and biliary stent placement with sphincterotomy. SP repeat ERCP on 03/16/2020 with metal stent placement. SP EUS on 03/16/2020 Adenocarcinoma of the pancreatic head Appreciate [facility] assistance. EUS is showing moderate chronic pancreatitis mass in the  pancreatic head with malignant appearing lymph nodes. Biopsy performed.  Results shows adenocarcinoma but also discussed with Dr. Julien Nordmann on call. He will arrange for outpatient follow-up with the patient. LFT currently stable  3.  Alcohol abuse Alcoholic liver cirrhosis SBP Elevated LFT Currently no evidence of decompensation. Also no evidence of withdrawal. Monitor.  Continue vitamins and supplements Currently no significant ascites on exam. Continue Aldactone and Lasix  4.  GERD Continue PPI  5.  Hyponatremia Likely hypervolemic in the setting of cirrhosis and alcohol abuse. Currently no focal deficit no confusion. Monitor.  6.  Vitamin B12 deficiency Relative vitamin B12 deficiency.  Continue supplementation.  7.  Hyperbilirubinemia Bilirubin normalizing after replacement of stent.  Monitor.  8.  Peripheral neuropathy. Continue gabapentin.  9.  Right-sided right upper quadrant abdominal pain. Repeat CT scan does not show any evidence of acute abnormality. Currently pain control.  Diet: Soft diet DVT Prophylaxis:   enoxaparin (LOVENOX) injection 40 mg Start: 03/17/20 2030 SCDs Start: 02/24/20 0934    Advance goals of care discussion: Full code  Family Communication: no family was present at bedside, at the time of interview.   Disposition:  Status is: Inpatient  Remains inpatient appropriate because:Unsafe d/c plan   Dispo:  Patient From: Home  Planned Disposition: Home  Expected discharge date: 03/20/20  Medically stable for discharge: No  Subjective: Pain improving. No nausea no vomiting. No fever no chills. Currently gradually improving. No bowel movement.  Physical Exam:  General: Appear in mild distress, no Rash; Oral Mucosa Clear, moist. no Abnormal Neck Mass Or lumps, Conjunctiva normal  Cardiovascular: S1 and S2 Present, no Murmur, Respiratory: good respiratory effort, Bilateral Air entry present and Clear to Auscultation, no Crackles,  no wheezes Abdomen: Bowel Sound present, Soft and mild RUQ tenderness Extremities: no Pedal edema, no calf tenderness Neurology: alert and oriented to time, place, and person affect appropriate. no new focal deficit Gait not checked due to patient safety concerns  Vitals:   03/18/20 1500 03/18/20 2201 03/19/20 0631 03/19/20 1500  BP: 121/81 128/84 120/86 120/84  Pulse: 82 82 79 80  Resp: '18 17 18 18  '$ Temp: 97.7 F (36.5 C) 99.2 F (37.3 C) 98.7 F (37.1 C) 99.1 F (37.3 C)  TempSrc: Oral Oral Oral Oral  SpO2: 96% 96% 97% 97%  Weight:      Height:        Intake/Output Summary (Last 24 hours) at 03/19/2020 1738 Last data filed at 03/19/2020 1300 Gross per 24 hour  Intake 990 ml  Output --  Net 990 ml   Filed Weights   02/27/20 0351 03/03/20 0439 03/16/20 0813  Weight: (!) 97 kg 91.8 kg 91.8 kg    Data Reviewed: I have personally reviewed and interpreted daily labs, tele strips, imagings as discussed above. I reviewed all nursing notes, pharmacy notes, vitals, pertinent old records I have discussed plan of care as described above with RN and patient/family.  CBC: Recent Labs  Lab 03/13/20 1022 03/17/20 0912 03/18/20 0832  WBC 10.0 15.0* 11.8*  NEUTROABS 6.9 12.3* 8.2*  HGB 13.8 13.6 14.4  HCT 40.7 40.7 42.6  MCV 95.3 96.4 95.7  PLT 408* 478* 630*   Basic Metabolic Panel: Recent Labs  Lab 03/14/20 0647 03/15/20 0840 03/16/20 0601 03/17/20 0912 03/18/20 0832  NA 131* 128* 130* 129* 133*  K 4.3 4.0 4.4 3.9 4.1  CL 94* 94* 94* 93* 96*  CO2 '25 24 25 '$ 21* 27  GLUCOSE 112* 149* 123* 207* 112*  BUN '10 9 11 10 13  '$ CREATININE 0.67 0.74 0.73 0.83 0.86  CALCIUM 9.0 8.9 8.9 9.3 9.0  MG  --   --   --   --  2.0    Studies: CT ABDOMEN PELVIS W CONTRAST  Result Date: 03/18/2020 CLINICAL DATA:  Possible intra-abdominal abscess. Recent admission for cholangitis post CBD stent placement and JP drain. Worsening abdominal pain, nausea and fever. EXAM: CT ABDOMEN AND  PELVIS WITH CONTRAST TECHNIQUE: Multidetector CT imaging of the abdomen and pelvis was performed using the standard protocol following bolus administration of intravenous contrast. CONTRAST:  120m OMNIPAQUE IOHEXOL 300 MG/ML  SOLN COMPARISON:  03/08/2020 and 02/28/2020 FINDINGS: Lower chest: Lung bases are clear. Hepatobiliary: Previous cholecystectomy. Evidence of patient's known cirrhosis. Common bile duct stent remains in place and unchanged. Mild pneumobilia over the left lobe of the liver centrally. Subtle low-density 1.2 cm focus over the inferior tip of the right lobe of the liver without significant change. Single adjacent subcentimeter hypodensity over the inferior right lobe of the liver without significant change. No significant change from recent prior exam from 03/08/2020 although several resolved subcentimeter hypodensities over the inferior right lobe of the liver compared to 02/28/2020. This likely represents resolving infection. Pancreas: Within normal. Spleen: Normal. Adrenals/Urinary Tract: Adrenal glands are normal. Kidneys are normal in size without hydronephrosis or nephrolithiasis. Ureters and bladder are normal. Stomach/Bowel: Stomach is normal. Small bowel is unremarkable. Appendix is normal. Minimal colonic diverticulosis. Vascular/Lymphatic: Abdominal aorta is normal caliber. Subtle calcified plaque over the proximal abdominal aorta. Remaining vascular structures are unremarkable. No adenopathy. Reproductive: Normal. Other: Mild ascites with slight overall improvement. Musculoskeletal: Degenerative change of the spine and hips. IMPRESSION: 1. Evidence of patient's  known cirrhosis. Mild ascites with slight overall improvement. Common bile duct stent remains in place and unchanged. 2. Two small low-density foci over the inferior right lobe of the liver without significant change from recent prior exam from 03/08/2020, although several resolved subcentimeter hypodensities over the inferior  right lobe of the liver compared to 02/28/2020. This likely represents resolving infection. 3. Minimal colonic diverticulosis. 4. Aortic atherosclerosis. Aortic Atherosclerosis (ICD10-I70.0). Electronically Signed   By: Marin Olp M.D.   On: 03/18/2020 18:57    Scheduled Meds: . amoxicillin-clavulanate  1 tablet Oral Q12H  . enoxaparin (LOVENOX) injection  40 mg Subcutaneous Daily  . folic acid  1 mg Oral Daily  . furosemide  40 mg Oral Daily  . gabapentin  300 mg Oral TID  . Gerhardt's butt cream   Topical TID  . hydrocortisone  25 mg Rectal BID  . multivitamin with minerals  1 tablet Oral Daily  . pantoprazole  40 mg Oral Daily  . polyethylene glycol  17 g Oral Daily  . sodium chloride flush  3 mL Intravenous Q12H  . spironolactone  50 mg Oral Daily  . sulfamethoxazole-trimethoprim  1 tablet Oral Q12H  . thiamine  100 mg Oral Daily  . vitamin B-12  1,000 mcg Oral Daily   Continuous Infusions: . sodium chloride Stopped (03/06/20 0419)   PRN Meds: sodium chloride, HYDROcodone-acetaminophen, [DISCONTINUED] ondansetron **OR** ondansetron (ZOFRAN) IV, phenol  Time spent: 35 minutes  Author: Berle Mull, MD [facility] 03/19/2020 5:38 PM  To reach On-call, see care teams to locate the attending and reach out via [email or web address]. Between 7PM-7AM, please contact night-coverage If you still have difficulty reaching the attending provider, please page the [facility] (Director on Call) for [facility] on amion for assistance.

## 2020-03-19 NOTE — Progress Notes (Signed)
[facility] Progress Note  Micheal Watson 59 y.o. 29-May-1961  CC: Abdominal pain, bacteremia, SBP, pancreatic mass, biliary stricture   Subjective: Patient seen and examined at bedside.  Interpreter software used for history taking.  Denies abdominal pain.  Denies blood in the stool.  Complaining of constipation.   ROS : Afebrile, negative for chest pain   Objective: Vital signs in last 24 hours: Vitals:   03/18/20 2201 03/19/20 0631  BP: 128/84 120/86  Pulse: 82 79  Resp: 17 18  Temp: 99.2 F (37.3 C) 98.7 F (37.1 C)  SpO2: 96% 97%    Physical Exam:  General:  Alert, cooperative, no distress, appears stated age  Head:  Normocephalic, without obvious abnormality, atraumatic  Eyes:  , EOM's intact,   Lungs:    No visible respiratory distress noted  Heart:  Regular rate and rhythm, S1, S2 normal  Abdomen:    Abdomen is soft, nontender, nondistended, bowel sounds present.  No peritoneal signs  Extremities: Extremities normal, atraumatic, no  edema       Lab Results: Recent Labs    03/17/20 0912 03/18/20 0832  NA 129* 133*  K 3.9 4.1  CL 93* 96*  CO2 21* 27  GLUCOSE 207* 112*  BUN 10 13  CREATININE 0.83 0.86  CALCIUM 9.3 9.0  MG  --  2.0   Recent Labs    03/17/20 0912 03/18/20 0832  AST 59* 46*  ALT 53* 48*  ALKPHOS 207* 176*  BILITOT 1.4* 1.3*  PROT 7.6 7.4  ALBUMIN 2.9* 2.8*   Recent Labs    03/17/20 0912 03/18/20 0832  WBC 15.0* 11.8*  NEUTROABS 12.3* 8.2*  HGB 13.6 14.4  HCT 40.7 42.6  MCV 96.4 95.7  PLT 478* 501*   No results for input(s): LABPROT, INR in the last 72 hours.    Assessment/Plan: -Pancreatic head mass with biliary stricture with EUS showing possible tumor ingrowth into the common bile duct.  Malignant appearing lymph nodes also seen.  Cytology positive for pancreatic adenocarcinoma.   ERCP with 10 French by 6 cm fully covered metal stent placement 03/16/2020   -Polymicrobial bacteremia along with  SBP. -History of laparoscopic cholecystectomy with postoperative bile leak in April 2021.  Follow-up ERCP with stent placement on November 29, 2019.  Had diagnostic laparoscopy in May 2021.  Another ERCP on February 24, 2020-previously placed stent was partially occluded which was removed.   Another 10 French 7 cm plastic stent was placed.  Cytology showed atypical cells.  Recommendations ----------------------- -Finding of pancreatic adenocarcinoma discussed with the patient.  Patient  aware of it.  Oncology has been made aware of the diagnosis and he will be seen as an outpatient for consultation to discuss further treatment options. -Start MiraLAX for constipation -Change diet to regular -No further inpatient GI work-up planned.  Okay to discharge from GI standpoint.  GI will sign off.  Call us back if needed   Otis Brace MD, [facility] 03/19/2020, 10:37 AM  Contact #  [phone]

## 2020-03-20 ENCOUNTER — Other Ambulatory Visit: Payer: Self-pay

## 2020-03-20 DIAGNOSIS — C259 Malignant neoplasm of pancreas, unspecified: Secondary | ICD-10-CM

## 2020-03-20 MED ORDER — PANTOPRAZOLE SODIUM 40 MG PO TBEC: 40.0000 mg | DELAYED_RELEASE_TABLET | Freq: Every day | ORAL | 0 refills | Status: DC

## 2020-03-20 MED ORDER — GABAPENTIN 300 MG PO CAPS: 300.0000 mg | ORAL_CAPSULE | Freq: Three times a day (TID) | ORAL | 0 refills | Status: DC

## 2020-03-20 MED ORDER — FOLIC ACID 1 MG PO TABS: 1.0000 mg | ORAL_TABLET | Freq: Every day | ORAL | 0 refills | Status: DC

## 2020-03-20 MED ORDER — FUROSEMIDE 40 MG PO TABS: 40.0000 mg | ORAL_TABLET | Freq: Every day | ORAL | 0 refills | Status: DC

## 2020-03-20 MED ORDER — SPIRONOLACTONE 50 MG PO TABS: 50.0000 mg | ORAL_TABLET | Freq: Every day | ORAL | 0 refills | Status: DC

## 2020-03-20 MED ORDER — SACCHAROMYCES BOULARDII 250 MG PO CAPS: 250.0000 mg | ORAL_CAPSULE | Freq: Two times a day (BID) | ORAL | 0 refills | Status: AC

## 2020-03-20 MED ORDER — THIAMINE HCL 100 MG PO TABS: 100.0000 mg | ORAL_TABLET | Freq: Every day | ORAL | 0 refills | Status: DC

## 2020-03-20 MED ORDER — SULFAMETHOXAZOLE-TRIMETHOPRIM 800-160 MG PO TABS
1.0000 | ORAL_TABLET | Freq: Two times a day (BID) | ORAL | 0 refills | Status: AC
Start: 2020-03-20 — End: 2020-04-03

## 2020-03-20 MED ORDER — AMOXICILLIN-POT CLAVULANATE 875-125 MG PO TABS: 1.0000 | ORAL_TABLET | Freq: Two times a day (BID) | ORAL | 0 refills | Status: AC

## 2020-03-20 MED ORDER — CYANOCOBALAMIN 1000 MCG PO TABS: 1000.0000 ug | ORAL_TABLET | Freq: Every day | ORAL | 0 refills | Status: DC

## 2020-03-20 MED ORDER — HYDROCORTISONE ACETATE 25 MG RE SUPP: 25.0000 mg | Freq: Two times a day (BID) | RECTAL | 0 refills | Status: AC

## 2020-03-20 MED ORDER — POLYETHYLENE GLYCOL 3350 17 G PO PACK: 17.0000 g | PACK | Freq: Every day | ORAL | 0 refills | Status: AC

## 2020-03-20 MED FILL — VITAMIN B-12 1000 MCG TABS: 1000 | 30 days supply | Qty: 30 | Fill #0

## 2020-03-20 MED FILL — FUROSEMIDE 40 MG TABLET: 40 | 30 days supply | Qty: 30 | Fill #0

## 2020-03-20 MED FILL — AMOX-CLAV 875-125 MG TABLET: 875-125 | 14 days supply | Qty: 28 | Fill #0

## 2020-03-20 MED FILL — VITAMIN B-1 100 MG TABS: 100 | 30 days supply | Qty: 30 | Fill #0

## 2020-03-20 MED FILL — POLYETHYLENE GLYCOL 3350 PO: 17 | 14 days supply | Qty: 238 | Fill #0

## 2020-03-20 MED FILL — FOLIC ACID 1 MG TABS: 1 | 30 days supply | Qty: 30 | Fill #0

## 2020-03-20 MED FILL — GABAPENTIN 300 MG CAPSULE: 300 | 30 days supply | Qty: 90 | Fill #0

## 2020-03-20 MED FILL — SPIRONOLACTONE 50 MG TAB: 50 | 30 days supply | Qty: 30 | Fill #0

## 2020-03-20 MED FILL — SULFAMETHOXAZOLE-TMP DS TAB: 800-160 | 14 days supply | Qty: 28 | Fill #0

## 2020-03-20 MED FILL — HYDROCORTISONE AC 25 MG SUP: 25 | 3 days supply | Qty: 6 | Fill #0

## 2020-03-20 MED FILL — PANTOPRAZOLE SOD DR 40 MG T: 40 | 30 days supply | Qty: 30 | Fill #0

## 2020-03-20 NOTE — Progress Notes (Signed)
Physical Therapy Note  During yesterday's session, pt mentioned one of his contact phone numbers may be suspended;  He provided tow numbers to reach him:  Micheal Watson [phone]  And if Micheal Watson's number isn't working, then try  [name], [place] Pager [phone] Office [phone]

## 2020-03-20 NOTE — Progress Notes (Signed)
Discharge instructions reviewed with patient using Spanish interpreter, [name] and patient has medications.  Patient verbalized understanding, however had questions about why he was here and why he has so many appointments.  Upon going over his diagnosis, he stated he did not know anything about having CA.  Sent MD a message to come speak to patient about diagnosis and results using interpreter.  Micheal Watson [place]

## 2020-03-20 NOTE — Progress Notes (Signed)
The patient's diagnosis of pancreatic adenoCA has been noted.  We are in the process of trying to arrange follow up with our hepatobiliary specialist, Dr. Barry Dienes.  Nothing acutely surgical is needed as an inpatient at this time.  Discharge per primary service.  Micheal Watson 8:24 AM 03/20/2020

## 2020-03-22 ENCOUNTER — Telehealth: Payer: Self-pay | Admitting: Nurse Practitioner

## 2020-03-22 NOTE — Telephone Encounter (Signed)
A new pt appt has been scheduled for Micheal Watson to see Lacie on 9/2 at 1:45pm w/labs at 1:15pm. Letter of the appt has been made to the pt.

## 2020-03-22 NOTE — Discharge Summary (Signed)
[facility] Discharge Summary   Patient: Collen Vincent PXT:[id number]  PCP: Patient, No Pcp Per  Date of admission: 02/23/2020   Date of discharge: 03/20/2020     Discharge Diagnoses:   Principal Problem:   Sepsis following intra-abdominal surgery ([place]) Active Problems:   Hyperbilirubinemia   Essential hypertension   Alcohol dependence with uncomplicated withdrawal ([place])   Jaundice   Hyponatremia   Admitted From: home Disposition:  Home   Recommendations for Outpatient Follow-up:  1. PCP: follow up as recommended 2. Follow up LABS/TEST:  none   Follow-up [facility]. Call.   Specialty: Professional Counselor Why: [place] para recibir ayuda con el consumo alcohol. Contact information: [employer] [facility] [place] [address] [phone]        [facility] Follow up.   Why: April 07, 2020 at 2:30 pm  Contact information: [place] [address] [phone]       Stark Klein, MD Follow up on 04/07/2020.   Specialty: General Surgery Why: arrive at 9:30am for a 10:00am appointment time.  please bring your insurance card and photo ID Contact information: [address] [place] [address] [phone]        [facility]             . Schedule an appointment as soon as possible for a visit in 1 month(s).   Contact information: [address] [place] [address]             Diet recommendation: Cardiac diet  Activity: The patient is advised to gradually reintroduce usual activities, as tolerated  Discharge Condition: stable  Code Status: Full code   History of present illness: As per the H and P dictated on admission, ": Naithen Rivenburg is a 59 y.o. male with medical history significant of ETOH dependence; recent acute  cholecystitis/cholangitis s/p CBD stent and JP drain placement (removed since).  He is now presenting with recurrent abdomnial pain.  He reports 2-3 days of primarily LLQ abdominal pain.  +n/v/d.  +fever to maybe 100.  Continues to drink alcohol, but none in a week due to ongoing abdominal issues.  History obtained via teleinterpreter.   ED Course:  Ascites following prior issues.  Daily ETOH use but none in a week due to pain and jaundice.  Went to surgery clinic and sent to ER.  Fever, abdominal pain.  Abscess in RUQ, stent in place.  Bili is 19, up from 0.8.  Code sepsis, given Zosyn and Vanc, non-operative."  Hospital Course:  Summary of his active problems in the hospital is as following.   1. Sepsis secondary to SBP and polymicrobial bacteremia, POA. Hepatic abscess seen on CT scan 7/26 Bacteremia SIRS criteria met on admission with tachypnea, fever, tachycardia and leukocytosis. Found to have hepatic abscess, SBP. Blood culture positive for Proteus pulmonary, Klebsiella and E. coli. Currently sepsis physiology resolved. SP paracentesis with fluid analysis consistent with SBP. SBP in the setting of recent abdominal surgery lap chole on November 22, 2019 as well as cholangitis. Initially on IV Zosyn followed by ceftriaxone Flagyl. This was transitioned to oral Bactrim and Augmentin. Brieflyon IV meropenem. Change back to Augmentin and Bactrim.  Discussed with ID. Patient will require follow-up with ID at a repeat CT scan. We will continue antibiotic on discharge for at least 2 weeks.  CT scan on 03/18/2020 shows resolving infection. Infectious disease, general surgery, GI following the patient.  2. Pancreatic massadenocarcinoma Biliary stricture. SP ERCP and biliary stent placement with sphincterotomy. SP repeat ERCP on 03/16/2020 with metal stent placement. SP EUS on 03/16/2020 Adenocarcinoma of the pancreatic head Appreciate [facility] assistance. EUS is showing moderate chronic  pancreatitis mass in the pancreatic head with malignant appearing lymph nodes. Biopsy performed. Results shows adenocarcinoma but also discussed with Dr. Julien Nordmann on call. He will arrange for outpatient follow-up with the patient. LFT currently stable  3. Alcohol abuse Alcoholic liver cirrhosis SBP Elevated LFT Currently no evidence of decompensation. Also no evidence of withdrawal. Monitor. Continue vitamins and supplements Currently no significant ascites on exam. Continue Aldactone and Lasix  4. GERD Continue PPI  5. Hyponatremia Likely hypervolemic in the setting of cirrhosis and alcohol abuse. Currently no focal deficit no confusion. Monitor.  6. Vitamin B12 deficiency Relative vitamin B12 deficiency. Continue supplementation.  7. Hyperbilirubinemia Bilirubin normalizing after replacement of stent. Monitor.  8. Peripheral neuropathy. Continue gabapentin.  9.Right-sided right upper quadrantabdominal pain. Repeat CT scan does not show any evidence of acute abnormality. Currently pain control.  Patient was seen by physical therapy, who recommended Home health, which was arranged. On the day of the discharge the patient's vitals were stable, and no other acute medical condition were reported by patient. the patient was felt safe to be discharge at Home with Home health.  Consultants: General surgery  Gastroenterology  ID Procedures: ERCP and EUS  Discharge Exam: General: Appear in mild distress, no Rash; Oral Mucosa Clear, moist. Cardiovascular: S1 and S2 Present, no Murmur, Respiratory: normal respiratory effort, Bilateral Air entry present and no Crackles, no wheezes Abdomen: Bowel Sound present, Soft and no tenderness, no hernia Extremities: no Pedal edema, no calf tenderness Neurology: alert and oriented to time, place, and person affect appropriate.  Filed Weights   02/27/20 0351 03/03/20 0439 03/16/20 0813  Weight: (!) 97 kg 91.8 kg  91.8 kg   Vitals:   03/19/20 2011 03/20/20 0300  BP: 121/82 120/81  Pulse: 81 80  Resp: 18 18  Temp: 99 F (37.2 C) 98.9 F (37.2 C)  SpO2: 98% 98%    DISCHARGE MEDICATION: Allergies as of 03/20/2020      Reactions   Pork-derived Products Other (See Comments)   "I have pain in my stomach if I eat this"      Medication List    TAKE these medications   amoxicillin-clavulanate 875-125 MG tablet Commonly known as: AUGMENTIN Take 1 tablet by mouth 2 (two) times daily for 14 days.   cyanocobalamin 1000 MCG tablet Take 1 tablet (1,000 mcg total) by mouth daily.   folic acid 1 MG tablet Commonly known as: FOLVITE Take 1 tablet (1 mg total) by mouth daily.   furosemide 40 MG tablet Commonly known as: LASIX Take 1 tablet (40 mg total) by mouth daily.   gabapentin 300 MG capsule Commonly known as: NEURONTIN Take 1 capsule (300 mg total) by mouth 3 (three) times daily.   hydrocortisone 25 MG suppository Commonly known as: ANUSOL-HC Place 1 suppository (25 mg total) rectally 2 (two) times daily.   pantoprazole 40 MG tablet Commonly known as: PROTONIX Take 1 tablet (40 mg total) by mouth daily.   polyethylene glycol 17 g packet Commonly known as: MIRALAX / GLYCOLAX Take 17 g by mouth daily.   saccharomyces boulardii 250 MG capsule Commonly known as: Florastor Take 1 capsule (250 mg total) by  mouth 2 (two) times daily.   spironolactone 50 MG tablet Commonly known as: ALDACTONE Take 1 tablet (50 mg total) by mouth daily.   sulfamethoxazole-trimethoprim 800-160 MG tablet Commonly known as: BACTRIM DS Take 1 tablet by mouth 2 (two) times daily for 14 days.   thiamine 100 MG tablet Take 1 tablet (100 mg total) by mouth daily.      Allergies  Allergen Reactions   Pork-Derived Products Other (See Comments)    "I have pain in my stomach if I eat this"   Discharge Instructions    Ambulatory referral to Oncology   Complete by: As directed    Diet - low sodium  heart healthy   Complete by: As directed    Increase activity slowly   Complete by: As directed       The results of significant diagnostics from this hospitalization (including imaging, microbiology, ancillary and laboratory) are listed below for reference.    Significant Diagnostic Studies: CT ABDOMEN W CONTRAST  Result Date: 03/08/2020 CLINICAL DATA:  Acute abdominal pain, nonlocalized. EXAM: CT ABDOMEN WITH CONTRAST TECHNIQUE: Multidetector CT imaging of the abdomen was performed using the standard protocol following bolus administration of intravenous contrast. CONTRAST:  179mL OMNIPAQUE IOHEXOL 300 MG/ML  SOLN COMPARISON:  February 28 2020 FINDINGS: Lower chest: Basilar atelectasis.  No effusion.  No consolidation. Hepatobiliary: Liver with lobular and nodular contours. Fissural widening and mild caudate enlargement. Portal vein is patent. Hepatic veins are patent. Post cholecystectomy. Biliary stent remains in place. Small amount of pneumobilia. Not changed since previous study of February 28, 2020 Small areas of low attenuation in the RIGHT hepatic lobe are less conspicuous than on the previous exam. Largest seen on image 54 of series 3 measuring 9 mm. Stranding suggested along the anterior and lateral RIGHT abdomen in the omentum and in the pericolonic fat with very similar appearance to previous imaging studies. Associated with peritoneal fluid that is diminished in volume when compared to the prior exam following paracentesis. Pancreas: Pancreas is normal without ductal dilation, inflammation or focal lesion. Spleen: Mildly enlarged. Adrenals/Urinary Tract: Adrenal glands are normal. Kidneys enhance symmetrically.  No sign of hydronephrosis. Stomach/Bowel: Stool like material within small bowel loops of the RIGHT hemiabdomen. Appendix is normal. Inflammation along the RIGHT colon in the RIGHT paracolic gutter and RIGHT hemiabdomen similar to the prior study. Scattered areas of interloop fluid are  diminished as well since the prior exam. Vascular/Lymphatic: Vascular structures in the abdomen are patent. No sign of adenopathy in the upper abdomen or in the retroperitoneum. Other: Abdominal fluid and omental stranding as described. Musculoskeletal: Spinal degenerative changes. No acute or destructive bone process. IMPRESSION: 1. Small areas of low attenuation in the RIGHT hepatic lobe are less conspicuous than on the previous exam. Largest seen on image 54 of series 3 measuring 9 mm. This may represent improvement with respect to small hepatic abscesses are areas of focal inflammation. Correlate with clinical evidence of cholangitis. 2. Fat stranding along the RIGHT colon in the RIGHT paracolic gutter and RIGHT hemiabdomen similar to the prior study. Findings are of uncertain significance in the setting of multiple studies without clear evidence of biliary leak. Findings could also be related to underlying liver disease and ascites but this stranding raises the question of underlying peritoneal or omental inflammation though remains of uncertain significance in the absence of peritoneal enhancement and prior imaging evaluations. Correlation with results of paracentesis is suggested. 3. Biliary stent remains in place. Small amount of pneumobilia.  Not changed since previous study. 4. Mild splenomegaly. 5. Stool like material within small bowel loops of the RIGHT hemiabdomen. This could be related to ileus in the setting of inflammation. 6. Aortic atherosclerosis. Aortic Atherosclerosis (ICD10-I70.0). Electronically Signed   By: Donzetta Kohut M.D.   On: 03/08/2020 17:40   CT ABDOMEN PELVIS W CONTRAST  Result Date: 03/18/2020 CLINICAL DATA:  Possible intra-abdominal abscess. Recent admission for cholangitis post CBD stent placement and JP drain. Worsening abdominal pain, nausea and fever. EXAM: CT ABDOMEN AND PELVIS WITH CONTRAST TECHNIQUE: Multidetector CT imaging of the abdomen and pelvis was performed using  the standard protocol following bolus administration of intravenous contrast. CONTRAST:  OMNIPAQUE IOHEXOL 300 MG/ML  SOLN COMPARISON:  03/08/2020 and 02/28/2020 FINDINGS: Lower chest: Lung bases are clear. Hepatobiliary: Previous cholecystectomy. Evidence of patient's known cirrhosis. Common bile duct stent remains in place and unchanged. Mild pneumobilia over the left lobe of the liver centrally. Subtle low-density 1.2 cm focus over the inferior tip of the right lobe of the liver without significant change. Single adjacent subcentimeter hypodensity over the inferior right lobe of the liver without significant change. No significant change from recent prior exam from 03/08/2020 although several resolved subcentimeter hypodensities over the inferior right lobe of the liver compared to 02/28/2020. This likely represents resolving infection. Pancreas: Within normal. Spleen: Normal. Adrenals/Urinary Tract: Adrenal glands are normal. Kidneys are normal in size without hydronephrosis or nephrolithiasis. Ureters and bladder are normal. Stomach/Bowel: Stomach is normal. Small bowel is unremarkable. Appendix is normal. Minimal colonic diverticulosis. Vascular/Lymphatic: Abdominal aorta is normal caliber. Subtle calcified plaque over the proximal abdominal aorta. Remaining vascular structures are unremarkable. No adenopathy. Reproductive: Normal. Other: Mild ascites with slight overall improvement. Musculoskeletal: Degenerative change of the spine and hips. IMPRESSION: 1. Evidence of patient's known cirrhosis. Mild ascites with slight overall improvement. Common bile duct stent remains in place and unchanged. 2. Two small low-density foci over the inferior right lobe of the liver without significant change from recent prior exam from 03/08/2020, although several resolved subcentimeter hypodensities over the inferior right lobe of the liver compared to 02/28/2020. This likely represents resolving infection. 3. Minimal  colonic diverticulosis. 4. Aortic atherosclerosis. Aortic Atherosclerosis (ICD10-I70.0). Electronically Signed   By: Elberta Fortis M.D.   On: 03/18/2020 18:57   CT ABDOMEN PELVIS W CONTRAST  Result Date: 02/28/2020 CLINICAL DATA:  Abdominal abscess/infection suspected Abdominal pain.  Patient had recent paracentesis. EXAM: CT ABDOMEN AND PELVIS WITH CONTRAST TECHNIQUE: Multidetector CT imaging of the abdomen and pelvis was performed using the standard protocol following bolus administration of intravenous contrast. CONTRAST:  OMNIPAQUE IOHEXOL 300 MG/ML  SOLN COMPARISON:  Abdominal CT and ultrasound 4 days ago, 02/24/2020. multiple prior CT. FINDINGS: Lower chest: Calcified granuloma in the right lower lobe. Linear atelectasis in the left lower lobe. No pleural fluid. Heart is normal in size. Hepatobiliary: Cirrhotic hepatic morphology with nodular contours. The liver is enlarged spanning 26.7 cm cranial caudal. Again seen 1.2 cm low-density lesion in the inferior right lobe of the liver, series 3, image 65, unchanged from recent exam. No significant peripheral enhancement. There multiple small subcentimeter adjacent hepatic hypodensities in the right lobe extending from images 59 through 66. Subcentimeter vague hypodensity in the central right lobe series 3, image 39. Postcholecystectomy. Biliary stent remains in place, no significant intra or extrahepatic biliary ductal dilatation. Mild pneumobilia. Pancreas: No ductal dilatation or inflammation. Spleen: No splenomegaly. Unchanged cleft posteriorly. No focal lesion. Adrenals/Urinary Tract: No adrenal nodule. No  hydronephrosis or perinephric edema. Homogeneous renal enhancement with symmetric excretion on delayed phase imaging. Urinary bladder is partially distended without wall thickening. Stomach/Bowel: Patulous distal esophagus. Nondistended stomach. Normal positioning of the duodenum and ligament of Treitz. Administered enteric contrast reaches the  colon. There is no bowel obstruction. No significant bowel inflammation. Appendix is air-filled and otherwise normal. Distal colonic diverticulosis without evidence of diverticulitis. Vascular/Lymphatic: Normal caliber abdominal aorta. No evidence of portal vein thrombosis. No bulky abdominopelvic adenopathy. Reproductive: Prominent prostate gland. Other: Moderate volume abdominopelvic ascites. This measures simple fluid density. There is no peripheral enhancement or loculation. No free intra-abdominal air. Mesenteric and omental edema is typical of ascites. Small umbilical hernia containing fat and small amount of ascites. Mild skin thickening adjacent to the umbilical hernia in the midline. Fat in both inguinal canals. Musculoskeletal: There are no acute or suspicious osseous abnormalities. Peripherally sclerotic lesion in the left femoral head is unchanged from prior exams. 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 IMPRESSION: 1. Stable low-density lesions in the right lobe of the liver, largest measuring 1.2 cm, suspicious for small abscesses. No significant progression or convalescence. Vague subcentimeter structure in the central liver was not definitively seen on prior exam, unclear if this represents a new area of infection or better seen currently due to differences in phase of contrast. 2. Moderate volume abdominopelvic ascites. There is no peripheral enhancement or loculation. 3. Cirrhosis. Biliary stent remains in place, no significant intra or extrahepatic biliary ductal dilatation. 4. Colonic diverticulosis without diverticulitis. 5. Small umbilical hernia containing fat and small amount of ascites. Mild skin thickening adjacent to the umbilical hernia in the midline. 6. Additional stable chronic findings as described. Electronically Signed   By: Keith Rake M.D.   On: 02/28/2020 15:25   CT  ABDOMEN PELVIS W CONTRAST  Result Date: 02/24/2020 CLINICAL DATA:  Generalized abdominal pain, swelling, and jaundice with vomiting and fever. Leukocytosis. EXAM: CT ABDOMEN AND PELVIS WITH CONTRAST TECHNIQUE: Multidetector CT imaging of the abdomen and pelvis was performed using the standard protocol following bolus administration of intravenous contrast. CONTRAST:  123mL OMNIPAQUE IOHEXOL 300 MG/ML  SOLN COMPARISON:  12/24/2019 FINDINGS: Lower chest: Punctate calcified nodules in the right middle lobe and right lower lobe. No basilar lung consolidation or pleural effusion. Hepatobiliary: There is a new 1.2 cm hypodense lesion inferiorly in the right hepatic lobe (series 3, image 63), and there are additional more subtle subcentimeter hypodensities in the inferior right hepatic lobe which also appear new (for example series 3, images 56, 58, 59, and 62). Status post cholecystectomy. A biliary stent remains in place without significant dilatation of the common bile duct, however there is increased dilatation of the common hepatic duct about the proximal aspect of the stent measuring 2.1 cm in diameter. There is at most minimal central intrahepatic biliary dilatation. Pancreas: Unremarkable. Spleen: No focal lesion. Unchanged cleft along the posterior splenic margin. Adrenals/Urinary Tract: Unremarkable adrenal glands. No evidence of renal mass, calculi, or hydronephrosis. Unremarkable bladder. Stomach/Bowel: The stomach is unremarkable. There is no evidence of bowel obstruction. Mildly prominent gaseous distension of the appendix without evidence of appendicitis. Vascular/Lymphatic: No significant vascular findings are present. No enlarged abdominal or pelvic lymph nodes. Reproductive: Mildly enlarged prostate. Other: Increased small to moderate volume ascites. No loculated fluid collection. No pneumoperitoneum. Small umbilical hernia containing fat and fluid. Musculoskeletal: No acute osseous abnormality or  suspicious osseous lesion. IMPRESSION: 1. New small  ill-defined hypodensities inferiorly in the right hepatic lobe, nonspecific though infection/small abscesses are a concern with this history. 2. Increased small to moderate volume ascites. 3. Unchanged biliary stent with increased dilatation of the common hepatic duct. 4. Aortic Atherosclerosis (ICD10-I70.0). Electronically Signed   By: Logan Bores M.D.   On: 02/24/2020 05:42   DG Chest Port 1 View  Result Date: 02/27/2020 CLINICAL DATA:  Shortness of breath.  Sepsis. EXAM: PORTABLE CHEST 1 VIEW COMPARISON:  12/03/2019 FINDINGS: Lordotic technique is demonstrated. Lungs are adequately inflated without focal airspace consolidation or effusion. Borderline stable cardiomegaly. Remainder of the exam is unchanged. IMPRESSION: No acute cardiopulmonary disease. Electronically Signed   By: Marin Olp M.D.   On: 02/27/2020 09:13   DG ERCP BILIARY & PANCREATIC DUCTS  Result Date: 03/17/2020 CLINICAL DATA:  Evaluate biliary stricture EXAM: ERCP TECHNIQUE: Multiple spot images obtained with the fluoroscopic device and submitted for interpretation post-procedure. FLUOROSCOPY TIME:  Fluoroscopy Time:  2 minutes 4 seconds Radiation Exposure Index (if provided by the fluoroscopic device): 35.9 mGy Number of Acquired Spot Images: 2 COMPARISON:  03/08/2020, ERCP from 02/24/2020 FINDINGS: Two spot films were obtained during placement of a metallic stent along the distal common bile duct extending into the duodenum. Mild narrowing is noted at the level of the ampullary of Vater. Previously seen plastic biliary stent has been removed. IMPRESSION: Metallic stent placement in the distal common bile duct. These images were submitted for radiologic interpretation only. Please see the procedural report for the amount of contrast and the fluoroscopy time utilized. Electronically Signed   By: Inez Catalina M.D.   On: 03/17/2020 12:43   DG ERCP BILIARY & PANCREATIC DUCTS  Result  Date: 02/24/2020 CLINICAL DATA:  ERCP. EXAM: ERCP TECHNIQUE: Multiple spot images obtained with the fluoroscopic device and submitted for interpretation post-procedure. FLUOROSCOPY TIME:  Fluoroscopy Time:  2 minutes and 13 seconds Radiation Exposure Index (if provided by the fluoroscopic device): 66.82 mGy Number of Acquired Spot Images: 6 COMPARISON:  CT dated February 24, 2020.  11/29/2019 FINDINGS: And insult spot images demonstrate a common bile duct stent. The stent has been removed. Injection of contrast followed by balloon sweep demonstrates a persistent narrowing of the mid and distal common bile duct. There appears to be some mild intrahepatic biliary ductal dilatation. The patient is status post prior cholecystectomy. Final images demonstrate replacement of the plastic biliary stent. IMPRESSION: Status post ERCP as detailed above. These images were submitted for radiologic interpretation only. Please see the procedural report for the amount of contrast and the fluoroscopy time utilized. Electronically Signed   By: Constance Holster M.D.   On: 02/24/2020 18:49   DG ABD ACUTE 2+V W 1V CHEST  Result Date: 03/10/2020 CLINICAL DATA:  Abdominal pain for 2 weeks, diarrhea, nausea and weakness for 4 days EXAM: DG ABDOMEN ACUTE W/ 1V CHEST COMPARISON:  CT 03/08/2020 FINDINGS: Few bandlike opacities in the left lung base likely reflecting subsegmental atelectatic change. No consolidation, features of edema, pneumothorax, or effusion. The aorta is calcified. The remaining cardiomediastinal contours are unremarkable. No subdiaphragmatic free air. Multiple clustered loops of borderline dilated small bowel are present within the mid abdomen with mild thickening of the small bowel folds in slight separation which could reflect mural edematous changes. Mild air distention of the colon is noted as well. Cholecystectomy clips and a biliary stent noted in the right upper quadrant. Multilevel degenerative changes in the  shoulders, spine, hips and pelvis. Stable bony excrescence along the  superolateral acetabular margin on the right is similar to prior. IMPRESSION: Findings most suggestive of developing ileus versus obstruction with features of mild bowel edema which is fairly nonspecific given patient's recent ascites. Prior cholecystectomy and biliary stenting. Electronically Signed   By: Lovena Le M.D.   On: 03/10/2020 17:29   VAS [place] ABI WITH/WO TBI  Result Date: 03/18/2020 LOWER EXTREMITY DOPPLER STUDY Indications: Claudication. High Risk Factors: Hypertension.  Comparison Study: No prior studies. Performing Technologist: Carlos Levering RVT  Examination Guidelines: A complete evaluation includes at minimum, Doppler waveform signals and systolic blood pressure reading at the level of bilateral brachial, anterior tibial, and posterior tibial arteries, when vessel segments are accessible. Bilateral testing is considered an integral part of a complete examination. Photoelectric Plethysmograph (PPG) waveforms and toe systolic pressure readings are included as required and additional duplex testing as needed. Limited examinations for reoccurring indications may be performed as noted.  ABI Findings: +--------+------------------+-----+---------+--------+  Right    Rt Pressure (mmHg) Index Waveform  Comment   +--------+------------------+-----+---------+--------+  Brachial 127                      triphasic           +--------+------------------+-----+---------+--------+  PTA      160                1.26  triphasic           +--------+------------------+-----+---------+--------+  DP       162                1.28  triphasic           +--------+------------------+-----+---------+--------+ +--------+------------------+-----+---------+-------+  Left     Lt Pressure (mmHg) Index Waveform  Comment  +--------+------------------+-----+---------+-------+  Brachial 125                      triphasic           +--------+------------------+-----+---------+-------+  PTA      149                1.17  triphasic          +--------+------------------+-----+---------+-------+  DP       154                1.21  triphasic          +--------+------------------+-----+---------+-------+ +-------+-----------+-----------+------------+------------+  ABI/TBI Today's ABI Today's TBI Previous ABI Previous TBI  +-------+-----------+-----------+------------+------------+  Right   1.28                                               +-------+-----------+-----------+------------+------------+  Left    1.21                                               +-------+-----------+-----------+------------+------------+  Summary: Right: Resting right ankle-brachial index is within normal range. No evidence of significant right lower extremity arterial disease. Left: Resting left ankle-brachial index is within normal range. No evidence of significant left lower extremity arterial disease.  *See table(s) above for measurements and observations.  Electronically signed by Ruta Hinds MD on 03/18/2020 at 12:13:37 PM.   Final    IR ABDOMEN US LIMITED  Result Date: 03/13/2020 CLINICAL DATA:  History of ascites and prior paracentesis. EXAM: LIMITED ABDOMEN ULTRASOUND FOR ASCITES TECHNIQUE: Limited ultrasound survey for ascites was performed in all four abdominal quadrants. COMPARISON:  03/10/2020 FINDINGS: Survey of the peritoneal cavity demonstrates no ascites. IMPRESSION: No ascites visualized by ultrasound. Electronically Signed   By: Aletta Edouard M.D.   On: 03/13/2020 13:51   US Abdomen Limited RUQ  Result Date: 02/24/2020 CLINICAL DATA:  Jaundice EXAM: ULTRASOUND ABDOMEN LIMITED RIGHT UPPER QUADRANT COMPARISON:  12/24/2019 FINDINGS: Gallbladder: Surgically absent Common bile duct: Diameter: 8 mm Liver: Nodularity of the liver capsule compatible with cirrhosis. No focal parenchymal abnormality. No intrahepatic duct dilation. Portal vein is patent  on color Doppler imaging with normal direction of blood flow towards the liver. Other: There is a small amount of ascites throughout the abdomen, greatest in the lower quadrants. IMPRESSION: 1. Small volume ascites. 2. Findings consistent with hepatic cirrhosis. 3. Cholecystectomy. Electronically Signed   By: Randa Ngo M.D.   On: 02/24/2020 03:23   IR Paracentesis  Result Date: 03/10/2020 INDICATION: Abdominal distention. History of spontaneous bacterial peritonitis. Recurrent ascites. Request for repeat diagnostic and therapeutic paracentesis. EXAM: ULTRASOUND GUIDED RIGHT LOWER QUADRANT PARACENTESIS MEDICATIONS: None. COMPLICATIONS: None immediate. PROCEDURE: Informed written consent was obtained from the patient after a discussion of the risks, benefits and alternatives to treatment. A timeout was performed prior to the initiation of the procedure. Initial ultrasound scanning demonstrates a small amount of ascites within the right lower abdominal quadrant. The right lower abdomen was prepped and draped in the usual sterile fashion. 1% lidocaine was used for local anesthesia. Following this, a 19 gauge, 7-cm, Yueh catheter was introduced. An ultrasound image was saved for documentation purposes. The paracentesis was performed. The catheter was removed and a dressing was applied. The patient tolerated the procedure well without immediate post procedural complication. FINDINGS: A total of approximately 600 mL of hazy, amber colored fluid was removed. Samples were sent to the laboratory as requested by the clinical team. IMPRESSION: Successful ultrasound-guided paracentesis yielding 600 mL of peritoneal fluid. Read by: Ascencion Dike PA-C Electronically Signed   By: Lucrezia Europe M.D.   On: 03/10/2020 12:47   IR Paracentesis  Result Date: 03/06/2020 INDICATION: Ascites secondary to alcoholic cirrhosis. Request for diagnostic and therapeutic paracentesis. EXAM: ULTRASOUND GUIDED PARACENTESIS MEDICATIONS: 1%  lidocaine 15 mL COMPLICATIONS: None immediate. PROCEDURE: Informed written consent was obtained from the patient after a discussion of the risks, benefits and alternatives to treatment. A timeout was performed prior to the initiation of the procedure. Initial ultrasound scanning demonstrates a moderate amount of ascites within the left lateral abdomen. The left lateral abdomen was prepped and draped in the usual sterile fashion. 1% lidocaine was used for local anesthesia. Following this, a 19 gauge, 7-cm, Yueh catheter was introduced. An ultrasound image was saved for documentation purposes. The paracentesis was performed. The catheter was removed and a dressing was applied. The patient tolerated the procedure well without immediate post procedural complication. FINDINGS: A total of approximately 1.1 L of clear yellow fluid was removed. Samples were sent to the laboratory as requested by the clinical team. IMPRESSION: Successful ultrasound-guided paracentesis yielding 1.1 L of peritoneal fluid. Read by: Gareth Eagle, PA-C Electronically Signed   By: Jerilynn Mages.  Shick M.D.   On: 03/06/2020 10:15   IR Paracentesis  Result Date: 02/28/2020 INDICATION: Abdominal pain, distention. Recurrent ascites. Request for therapeutic paracentesis. EXAM: ULTRASOUND GUIDED LEFT LOWER QUADRANT PARACENTESIS MEDICATIONS: None. COMPLICATIONS: None immediate. PROCEDURE:  Informed written consent was obtained from the patient after a discussion of the risks, benefits and alternatives to treatment. A timeout was performed prior to the initiation of the procedure. Initial ultrasound scanning demonstrates a small to moderate amount of ascites within the left lower abdominal quadrant. The left lower abdomen was prepped and draped in the usual sterile fashion. 1% lidocaine was used for local anesthesia. Following this, a 19 gauge, 7-cm, Yueh catheter was introduced. An ultrasound image was saved for documentation purposes. The paracentesis was  performed. The catheter was removed and a dressing was applied. The patient tolerated the procedure well without immediate post procedural complication. FINDINGS: A total of approximately 1.9 L of clear yellow fluid was removed. IMPRESSION: Successful ultrasound-guided paracentesis yielding 1.9 liters of peritoneal fluid. Read by: Ascencion Dike PA-C Electronically Signed   By: Corrie Mckusick D.O.   On: 02/28/2020 16:23   IR Paracentesis  Result Date: 02/25/2020 INDICATION: Patient with a history of alcoholic hepatitis and ascites presents today for a therapeutic and diagnostic paracentesis. EXAM: ULTRASOUND GUIDED PARACENTESIS MEDICATIONS: 1% lidocaine 10 mL COMPLICATIONS: None immediate PROCEDURE: Informed written consent was obtained from the patient after a discussion of the risks, benefits and alternatives to treatment. A timeout was performed prior to the initiation of the procedure. Initial ultrasound scanning demonstrates a large amount of ascites within the left lower abdominal quadrant. The left lower abdomen was prepped and draped in the usual sterile fashion. 1% lidocaine was used for local anesthesia. Following this, a 19 gauge, 7-cm, Yueh catheter was introduced. An ultrasound image was saved for documentation purposes. The paracentesis was performed. The catheter was removed and a dressing was applied. The patient tolerated the procedure well without immediate post procedural complication. FINDINGS: A total of approximately 2.2 L of dark yellow fluid was removed. Samples were sent to the laboratory as requested by the clinical team. IMPRESSION: Successful ultrasound-guided paracentesis yielding 2.2 liters of peritoneal fluid. Read by: Soyla Dryer, NP Electronically Signed   By: Lucrezia Europe M.D.   On: 02/25/2020 14:28    Microbiology: No results found for this or any previous visit (from the past 240 hour(s)).   Labs: CBC: Recent Labs  Lab 03/17/20 0912 03/18/20 0832  WBC 15.0* 11.8*    NEUTROABS 12.3* 8.2*  HGB 13.6 14.4  HCT 40.7 42.6  MCV 96.4 95.7  PLT 478* 001*   Basic Metabolic Panel: Recent Labs  Lab 03/15/20 0840 03/16/20 0601 03/17/20 0912 03/18/20 0832  NA 128* 130* 129* 133*  K 4.0 4.4 3.9 4.1  CL 94* 94* 93* 96*  CO2 24 25 21* 27  GLUCOSE 149* 123* 207* 112*  BUN $Re'9 11 10 13  'GTd$ CREATININE 0.74 0.73 0.83 0.86  CALCIUM 8.9 8.9 9.3 9.0  MG  --   --   --  2.0   Liver Function Tests: Recent Labs  Lab 03/15/20 0840 03/16/20 0601 03/17/20 0912 03/18/20 0832  AST 36 34 59* 46*  ALT 28 29 53* 48*  ALKPHOS 143* 145* 207* 176*  BILITOT 0.9 1.5* 1.4* 1.3*  PROT 6.9 7.0 7.6 7.4  ALBUMIN 2.8* 2.7* 2.9* 2.8*   No results for input(s): LIPASE, AMYLASE in the last 168 hours. No results for input(s): AMMONIA in the last 168 hours. Cardiac Enzymes: No results for input(s): CKTOTAL, CKMB, CKMBINDEX, TROPONINI in the last 168 hours. BNP (last 3 results) Recent Labs    02/29/20 0344 03/01/20 0836 03/02/20 0336  BNP 62.9 98.0 102.7*   CBG: No  results for input(s): GLUCAP in the last 168 hours.  Time spent: 35 minutes  Signed:  Berle Mull  [facility] 03/20/2020

## 2020-03-22 NOTE — Progress Notes (Signed)
I left a voice message for patient's on regarding his appointment to see medical oncology.  This is scheduled for Thursday 04/06/2020 to be here by 1:00 to lab work and to see Cira Rue NP/Dr. Burr Medico.  I asked that he call back to confirm receipt of this message.  My direct contact number was left on this voice message.

## 2020-04-05 ENCOUNTER — Ambulatory Visit: Payer: Self-pay | Admitting: Nurse Practitioner

## 2020-04-06 ENCOUNTER — Inpatient Hospital Stay: Payer: Self-pay | Attending: Nurse Practitioner | Admitting: Nurse Practitioner

## 2020-04-06 ENCOUNTER — Inpatient Hospital Stay: Payer: Self-pay

## 2020-04-07 ENCOUNTER — Ambulatory Visit: Payer: Self-pay | Attending: Internal Medicine | Admitting: Internal Medicine

## 2020-04-07 ENCOUNTER — Other Ambulatory Visit: Payer: Self-pay

## 2020-04-07 ENCOUNTER — Encounter: Payer: Self-pay | Admitting: Internal Medicine

## 2020-04-07 VITALS — BP 140/70 | HR 78 | Ht 69.0 in | Wt 203.0 lb

## 2020-04-07 DIAGNOSIS — K7031 Alcoholic cirrhosis of liver with ascites: Secondary | ICD-10-CM

## 2020-04-07 DIAGNOSIS — Z550 Illiteracy and low-level literacy: Secondary | ICD-10-CM

## 2020-04-07 DIAGNOSIS — K750 Abscess of liver: Secondary | ICD-10-CM

## 2020-04-07 DIAGNOSIS — E538 Deficiency of other specified B group vitamins: Secondary | ICD-10-CM

## 2020-04-07 DIAGNOSIS — Z09 Encounter for follow-up examination after completed treatment for conditions other than malignant neoplasm: Secondary | ICD-10-CM

## 2020-04-07 DIAGNOSIS — C250 Malignant neoplasm of head of pancreas: Secondary | ICD-10-CM

## 2020-04-07 MED ORDER — THIAMINE HCL 100 MG PO TABS: 100.0000 mg | ORAL_TABLET | Freq: Every day | ORAL | 3 refills | Status: AC

## 2020-04-07 MED ORDER — FUROSEMIDE 40 MG PO TABS: 40.0000 mg | ORAL_TABLET | Freq: Every day | ORAL | 2 refills | Status: AC

## 2020-04-07 MED ORDER — PANTOPRAZOLE SODIUM 40 MG PO TBEC: 40.0000 mg | DELAYED_RELEASE_TABLET | Freq: Every day | ORAL | 2 refills | Status: AC

## 2020-04-07 MED ORDER — CYANOCOBALAMIN 1000 MCG PO TABS: 1000.0000 ug | ORAL_TABLET | Freq: Every day | ORAL | 6 refills | Status: AC

## 2020-04-07 MED ORDER — GABAPENTIN 300 MG PO CAPS: 300.0000 mg | ORAL_CAPSULE | Freq: Three times a day (TID) | ORAL | 0 refills | Status: AC

## 2020-04-07 MED ORDER — FOLIC ACID 1 MG PO TABS: 1.0000 mg | ORAL_TABLET | Freq: Every day | ORAL | 5 refills | Status: AC

## 2020-04-07 MED ORDER — SPIRONOLACTONE 50 MG PO TABS: 50.0000 mg | ORAL_TABLET | Freq: Every day | ORAL | 2 refills | Status: AC

## 2020-04-07 NOTE — Progress Notes (Signed)
Patient ID: Micheal Watson, male    DOB: [birth year]  MRN: [id number]  CC: Hospitalization Follow-up   Subjective: Micheal Watson is a 59 y.o. male who presents for hosp f/u PCP is Dr. Margarita Rana whom he saw 12/2019.  No showed for f/u in 01/2020 His concerns today include:  Patient with history of HTN, EtOH abuse, ETOH cirrhosis, hepatic abscess, pancreatic CA, B12 deficiency, peripheral neuropathy  Patient hospitalized 7/21-8/16/2021 with recurrent abdominal pain and fever post acute cholecystitis/cholangitis status post CBD stent. Found to have hepatic abscess and SBP. Blood cultures positive for Proteus, Klebsiella and E. coli. Treated with broad-spectrum antibiotics. Abscess drained. Followed by infectious disease. Repeat CAT scan done 2 days prior to discharge showed infection in the liver resolving. -Found to have pancreatic mass. Status post ERCP and biliary stent placement with sphincterectomy. Pathology positive for adenocarcinoma. -Presented with elevated liver enzymes. Advised to refrain from EtOH use. Started on Aldactone and Lasix for cirrhosis with ascites. Found to have B12 deficiency and was started on B12 supplement. Also complained of symptoms suggesting peripheral neuropathy. He was placed on gabapentin. -Patient was to follow-up with ID, general surgery, oncology. Since leaving the hospital he is not seen any of the specialists. He had an appointment scheduled this morning with the general surgeon but patient states he did not know where to go.  Today: He reports no fever, nausea vomiting, diarrhea or abdominal pain since discharge. He endorses early satiety and lower extremity edema. He drinks a beer every now and again. Reports he last drank 5 days ago. He was discharged on several medications but he did not bring his medicines with him and he does not recall the names. Patient Active Problem List   Diagnosis Date Noted  . Jaundice   . Hyponatremia   . Sepsis  following intra-abdominal surgery ([place]) 02/24/2020  . Alcohol dependence with uncomplicated withdrawal ([place]) 02/24/2020  . Abdominal pain 12/24/2019  . Essential hypertension 12/15/2019  . Bile leak, postoperative 11/28/2019  . Acute cholecystitis without calculus 11/21/2019  . Protein-calorie malnutrition ([place]) 04/11/2019  . Hyperammonemia ([place]) 04/09/2019  . Elevated LFTs   . Gynecomastia, male   . Hyperbilirubinemia   . Tremor of unknown origin   . COVID-19 virus infection 11/27/2018  . Acute respiratory disease due to COVID-19 virus 11/27/2018  . SIRS (systemic inflammatory response syndrome) ([place]) 11/25/2018  . Acute on chronic respiratory failure with hypoxia ([place]) 11/21/2018  . Suspected COVID-19 virus infection 11/17/2018     Current Outpatient Medications on File Prior to Visit  Medication Sig Dispense Refill  . hydrocortisone (ANUSOL-HC) 25 MG suppository Place 1 suppository (25 mg total) rectally 2 (two) times daily. 12 suppository 0  . polyethylene glycol (MIRALAX / GLYCOLAX) 17 g packet Take 17 g by mouth daily. 14 each 0  . saccharomyces boulardii (FLORASTOR) 250 MG capsule Take 1 capsule (250 mg total) by mouth 2 (two) times daily. 30 capsule 0   No current facility-administered medications on file prior to visit.    Allergies  Allergen Reactions  . Pork-Derived Products Other (See Comments)    "I have pain in my stomach if I eat this"    Social History   Socioeconomic History  . Marital status: Married    Spouse name: Not on file  . Number of children: Not on file  . Years of education: Not on file  . Highest education level: Not on file  Occupational History  . Not on file  Tobacco Use  .  Smoking status: Former Smoker    Packs/day: 0.30    Types: Cigarettes    Quit date: 10/08/2018    Years since quitting: 1.4  . Smokeless tobacco: Never Used  Substance and Sexual Activity  . Alcohol use: Yes    Alcohol/week: 2.0 standard drinks    Types: 2 Cans of  beer per week  . Drug use: Not on file  . Sexual activity: Not on file  Other Topics Concern  . Not on file  Social History Narrative  . Not on file   Social Determinants of Health   Financial Resource Strain:   . Difficulty of Paying Living Expenses: Not on file  Food Insecurity:   . Worried About Charity fundraiser in the Last Year: Not on file  . Ran Out of Food in the Last Year: Not on file  Transportation Needs:   . Lack of Transportation (Medical): Not on file  . Lack of Transportation (Non-Medical): Not on file  Physical Activity:   . Days of Exercise per Week: Not on file  . Minutes of Exercise per Session: Not on file  Stress:   . Feeling of Stress : Not on file  Social Connections:   . Frequency of Communication with Friends and Family: Not on file  . Frequency of Social Gatherings with Friends and Family: Not on file  . Attends Religious Services: Not on file  . Active Member of Clubs or Organizations: Not on file  . Attends Archivist Meetings: Not on file  . Marital Status: Not on file  Intimate Partner Violence: Not At Risk  . Fear of Current or Ex-Partner: No  . Emotionally Abused: No  . Physically Abused: No  . Sexually Abused: No    Family History  Problem Relation Age of Onset  . CVA Father     Past Surgical History:  Procedure Laterality Date  . BILIARY BRUSHING  11/29/2019   Procedure: BILIARY BRUSHING;  Surgeon: Clarene Essex, MD;  Location: [place];  Service: Endoscopy;;  . BILIARY BRUSHING  02/24/2020   Procedure: BILIARY BRUSHING;  Surgeon: Arta Silence, MD;  Location: [facility] ENDOSCOPY;  Service: Endoscopy;;  . BILIARY DILATION  11/29/2019   Procedure: BILIARY DILATION;  Surgeon: Clarene Essex, MD;  Location: [facility] ENDOSCOPY;  Service: Endoscopy;;  . BILIARY STENT PLACEMENT  11/29/2019   Procedure: BILIARY STENT PLACEMENT;  Surgeon: Clarene Essex, MD;  Location: [facility] ENDOSCOPY;  Service: Endoscopy;;  . BILIARY STENT PLACEMENT  02/24/2020    Procedure: BILIARY STENT PLACEMENT;  Surgeon: Arta Silence, MD;  Location: [place];  Service: Endoscopy;;  . CHOLECYSTECTOMY N/A 11/22/2019   Procedure: LAPAROSCOPIC CHOLECYSTECTOMY;  Surgeon: Georganna Skeans, MD;  Location: [place];  Service: General;  Laterality: N/A;  . ENDOSCOPIC RETROGRADE CHOLANGIOPANCREATOGRAPHY (ERCP) WITH PROPOFOL N/A 03/16/2020   Procedure: ENDOSCOPIC RETROGRADE CHOLANGIOPANCREATOGRAPHY (ERCP) WITH PROPOFOL;  Surgeon: Clarene Essex, MD;  Location: [place];  Service: Endoscopy;  Laterality: N/A;  . ERCP N/A 11/29/2019   Procedure: ENDOSCOPIC RETROGRADE CHOLANGIOPANCREATOGRAPHY (ERCP);  Surgeon: Clarene Essex, MD;  Location: [place];  Service: Endoscopy;  Laterality: N/A;  . ERCP N/A 02/24/2020   Procedure: ENDOSCOPIC RETROGRADE CHOLANGIOPANCREATOGRAPHY (ERCP);  Surgeon: Arta Silence, MD;  Location: [facility] ENDOSCOPY;  Service: Endoscopy;  Laterality: N/A;  . ESOPHAGOGASTRODUODENOSCOPY (EGD) WITH PROPOFOL N/A 03/16/2020   Procedure: ESOPHAGOGASTRODUODENOSCOPY (EGD) WITH PROPOFOL;  Surgeon: Arta Silence, MD;  Location: [place];  Service: Endoscopy;  Laterality: N/A;  . FINE NEEDLE ASPIRATION  03/16/2020   Procedure: FINE  NEEDLE ASPIRATION (FNA) LINEAR;  Surgeon: Arta Silence, MD;  Location: [place];  Service: Endoscopy;;  . IR PARACENTESIS  02/25/2020  . IR PARACENTESIS  02/28/2020  . IR PARACENTESIS  03/06/2020  . IR PARACENTESIS  03/10/2020  . LAPAROSCOPY N/A 12/07/2019   Procedure: LAPAROSCOPY DIAGNOSTIC;  Surgeon: Rolm Bookbinder, MD;  Location: [place];  Service: General;  Laterality: N/A;  . SPHINCTEROTOMY  11/29/2019   Procedure: Joan Mayans;  Surgeon: Clarene Essex, MD;  Location: [facility] ENDOSCOPY;  Service: Endoscopy;;  . STENT REMOVAL  02/24/2020   Procedure: STENT REMOVAL;  Surgeon: Arta Silence, MD;  Location: [facility] ENDOSCOPY;  Service: Endoscopy;;  . STENT REMOVAL  03/16/2020   Procedure: STENT REMOVAL;  Surgeon: Arta Silence, MD;  Location: [place];  Service: Endoscopy;;  . UPPER ESOPHAGEAL ENDOSCOPIC ULTRASOUND (EUS) N/A 03/16/2020   Procedure: UPPER ESOPHAGEAL ENDOSCOPIC ULTRASOUND (EUS);  Surgeon: Arta Silence, MD;  Location: [facility] ENDOSCOPY;  Service: Endoscopy;  Laterality: N/A;    ROS: Review of Systems Negative except as stated above  PHYSICAL EXAM: BP 140/70   Pulse 78   Ht 5\' 9"  (1.753 m)   Wt 203 lb (92.1 kg)   SpO2 99%   BMI 29.98 kg/m   Physical Exam  General appearance - alert, well appearing, and in no distress Mental status - normal mood, behavior, speech, dress, motor activity, and thought processes Eyes -nonicteric sclera Neck - supple, no significant adenopathy Chest - clear to auscultation, no wheezes, rales or rhonchi, symmetric air entry Heart - normal rate, regular rhythm, normal S1, S2, no murmurs, rubs, clicks or gallops Abdomen -mild distention. Normal bowel sounds. Soft. Mild tenderness in the epigastric to right upper quadrant area.  Extremities -1+ bilateral lower extremity edema. CMP Latest Ref Rng & Units 03/18/2020 03/17/2020 03/16/2020  Glucose 70 - 99 mg/dL 112(H) 207(H) 123(H)  BUN 6 - 20 mg/dL 13 10 11   Creatinine 0.61 - 1.24 mg/dL 0.86 0.83 0.73  Sodium 135 - 145 mmol/L 133(L) 129(L) 130(L)  Potassium 3.5 - 5.1 mmol/L 4.1 3.9 4.4  Chloride 98 - 111 mmol/L 96(L) 93(L) 94(L)  CO2 22 - 32 mmol/L 27 21(L) 25  Calcium 8.9 - 10.3 mg/dL 9.0 9.3 8.9  Total Protein 6.5 - 8.1 g/dL 7.4 7.6 7.0  Total Bilirubin 0.3 - 1.2 mg/dL 1.3(H) 1.4(H) 1.5(H)  Alkaline Phos 38 - 126 U/L 176(H) 207(H) 145(H)  AST 15 - 41 U/L 46(H) 59(H) 34  ALT 0 - 44 U/L 48(H) 53(H) 29   Lipid Panel     Component Value Date/Time   CHOL 295 (H) 04/11/2019 0621   TRIG 499 (H) 04/11/2019 0621   HDL 14 (L) 04/11/2019 0621   CHOLHDL 21.1 04/11/2019 0621   VLDL UNABLE TO CALCULATE IF TRIGLYCERIDE OVER 400 mg/dL 04/11/2019 0621   LDLCALC UNABLE TO CALCULATE IF TRIGLYCERIDE OVER 400 mg/dL 04/11/2019 0621   LDLDIRECT  173.5 (H) 04/11/2019 0621    CBC    Component Value Date/Time   WBC 11.8 (H) 03/18/2020 0832   RBC 4.45 03/18/2020 0832   HGB 14.4 03/18/2020 0832   HCT 42.6 03/18/2020 0832   HCT 34.7 (L) 04/09/2019 2238   PLT 501 (H) 03/18/2020 0832   MCV 95.7 03/18/2020 0832   MCH 32.4 03/18/2020 0832   MCHC 33.8 03/18/2020 0832   RDW 13.1 03/18/2020 0832   LYMPHSABS 2.1 03/18/2020 0832   MONOABS 1.4 (H) 03/18/2020 0832   EOSABS 0.0 03/18/2020 0832   BASOSABS 0.1 03/18/2020 1610  ASSESSMENT AND PLAN: 1. Hospital discharge follow-up 2. Hepatic abscess -Appears clinically improved. He has not had any fever or significant abdominal pain since discharge. He will keep follow-up appointment with infectious disease. -Patient with low health literacy. I had one of our Spanish-speaking RNs meet with him today to help him navigate the healthcare system and get appointments with oncology and the general surgeon. We have also given him the forms to apply for the orange card/cone discount.  3. Cancer of head of pancreas [facility]) Needs follow-up with oncology and general surgeon. - Comprehensive metabolic panel - CBC  4. Alcoholic cirrhosis of liver with ascites ([place]) Advised to abstain from alcohol use completely. Refills given on all medicines including furosemide and spironolactone. - folic acid (FOLVITE) 1 MG tablet; Take 1 tablet (1 mg total) by mouth daily.  Dispense: 30 tablet; Refill: 5 - thiamine 100 MG tablet; Take 1 tablet (100 mg total) by mouth daily.  Dispense: 30 tablet; Refill: 3 - furosemide (LASIX) 40 MG tablet; Take 1 tablet (40 mg total) by mouth daily.  Dispense: 30 tablet; Refill: 2 - pantoprazole (PROTONIX) 40 MG tablet; Take 1 tablet (40 mg total) by mouth daily.  Dispense: 30 tablet; Refill: 2 - spironolactone (ALDACTONE) 50 MG tablet; Take 1 tablet (50 mg total) by mouth daily.  Dispense: 30 tablet; Refill: 2  5. Vitamin B12 deficiency - cyanocobalamin 1000 MCG tablet; Take 1  tablet (1,000 mcg total) by mouth daily.  Dispense: 30 tablet; Refill: 6  6. Limited literacy  Patient was given the opportunity to ask questions.  Patient verbalized understanding of the plan and was able to repeat key elements of the plan.  [facility] interpreter used during this encounter. #[id number]  Orders Placed This Encounter  Procedures  . Comprehensive metabolic panel  . CBC     Requested Prescriptions   Signed Prescriptions Disp Refills  . folic acid (FOLVITE) 1 MG tablet 30 tablet 5    Sig: Take 1 tablet (1 mg total) by mouth daily.  . cyanocobalamin 1000 MCG tablet 30 tablet 6    Sig: Take 1 tablet (1,000 mcg total) by mouth daily.  Marland Kitchen thiamine 100 MG tablet 30 tablet 3    Sig: Take 1 tablet (100 mg total) by mouth daily.  Marland Kitchen gabapentin (NEURONTIN) 300 MG capsule 90 capsule 0    Sig: Take 1 capsule (300 mg total) by mouth 3 (three) times daily.  . furosemide (LASIX) 40 MG tablet 30 tablet 2    Sig: Take 1 tablet (40 mg total) by mouth daily.  . pantoprazole (PROTONIX) 40 MG tablet 30 tablet 2    Sig: Take 1 tablet (40 mg total) by mouth daily.  Marland Kitchen spironolactone (ALDACTONE) 50 MG tablet 30 tablet 2    Sig: Take 1 tablet (50 mg total) by mouth daily.    Return in about 2 weeks (around 04/21/2020) for with Dr. Margarita Rana.  Karle Plumber, MD, FACP

## 2020-04-08 LAB — CBC
Hematocrit: 42.6 % (ref 37.5–51.0)
Hemoglobin: 14.4 g/dL (ref 13.0–17.7)
MCH: 32.5 pg (ref 26.6–33.0)
MCHC: 33.8 g/dL (ref 31.5–35.7)
MCV: 96 fL (ref 79–97)
Platelets: 236 10*3/uL (ref 150–450)
RBC: 4.43 x10E6/uL (ref 4.14–5.80)
RDW: 14 % (ref 11.6–15.4)
WBC: 8.2 10*3/uL (ref 3.4–10.8)

## 2020-04-08 LAB — COMPREHENSIVE METABOLIC PANEL
ALT: 16 IU/L (ref 0–44)
AST: 24 IU/L (ref 0–40)
Albumin/Globulin Ratio: 1.3 (ref 1.2–2.2)
Albumin: 4.2 g/dL (ref 3.8–4.9)
Alkaline Phosphatase: 157 IU/L — ABNORMAL HIGH (ref 48–121)
BUN/Creatinine Ratio: 7 — ABNORMAL LOW (ref 9–20)
BUN: 6 mg/dL (ref 6–24)
Bilirubin Total: 1.2 mg/dL (ref 0.0–1.2)
CO2: 22 mmol/L (ref 20–29)
Calcium: 9.2 mg/dL (ref 8.7–10.2)
Chloride: 103 mmol/L (ref 96–106)
Creatinine, Ser: 0.86 mg/dL (ref 0.76–1.27)
GFR calc Af Amer: 110 mL/min/{1.73_m2} (ref >59)
GFR calc non Af Amer: 95 mL/min/{1.73_m2} (ref >59)
Globulin, Total: 3.3 g/dL (ref 1.5–4.5)
Glucose: 138 mg/dL — ABNORMAL HIGH (ref 65–99)
Potassium: 4 mmol/L (ref 3.5–5.2)
Sodium: 140 mmol/L (ref 134–144)
Total Protein: 7.5 g/dL (ref 6.0–8.5)

## 2020-04-11 ENCOUNTER — Ambulatory Visit: Payer: Self-pay | Admitting: Family

## 2020-04-11 ENCOUNTER — Telehealth: Payer: Self-pay

## 2020-04-11 ENCOUNTER — Ambulatory Visit: Payer: Self-pay

## 2020-04-11 NOTE — Telephone Encounter (Signed)
[employer] interpreters Clifton James  Id# [id number]  contacted pt to go over lab result pt hung up on interpreter twice. Didn't call pt back

## 2020-04-14 NOTE — Progress Notes (Signed)
Micheal Watson in house [place] attempted to reach the patient by phone.  Call would not go through however he was texted.  She then spoke with patient's son Micheal Watson about the appointment on 9/22 to arrive by 1:15 at [facility].  He verbalized an understanding.

## 2020-04-26 ENCOUNTER — Telehealth: Payer: Self-pay | Admitting: Nurse Practitioner

## 2020-04-26 ENCOUNTER — Inpatient Hospital Stay: Payer: Self-pay | Admitting: Nurse Practitioner

## 2020-04-26 NOTE — Telephone Encounter (Signed)
I called the patient to verify his appointment today, no answer. I did not leave a message. I spoke to his son, he raised his voice that he does not have anything to do with this appointment and does not know the nature of the visit but will confirm with the patient. He agrees to call back.  Cira Rue, NP

## 2020-05-04 LAB — ACID FAST CULTURE WITH REFLEXED SENSITIVITIES (MYCOBACTERIA): Acid Fast Culture: NEGATIVE

## 2020-07-17 IMAGING — CT CT ABD-PELV W/ CM
2 of 5 series · 16 of 46 positions shown, 18 images · IV contrast (APPLIED)
Comparison: December 06, 2019

CLINICAL DATA: Left lower quadrant abdominal pain.

EXAM:
CT ABDOMEN AND PELVIS WITH CONTRAST
TECHNIQUE: Multidetector CT imaging of the abdomen and pelvis was performed
using the standard protocol following bolus administration of
intravenous contrast.
CONTRAST:  100mL OMNIPAQUE IOHEXOL 300 MG/ML  SOLN

[Series 3: abd/ pelvis 5.0 i30f 2 · axial · 0.81mm/px · z∈[+799,+1224]mm · 13 of 97 slices shown, 15 images]
[im 6/97  soft-tissue]
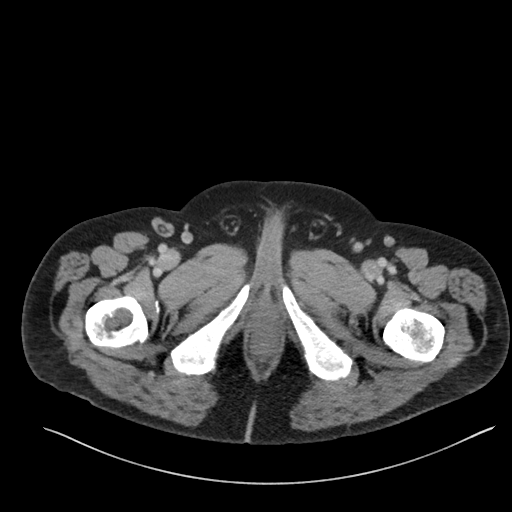
[im 6/97  bone]
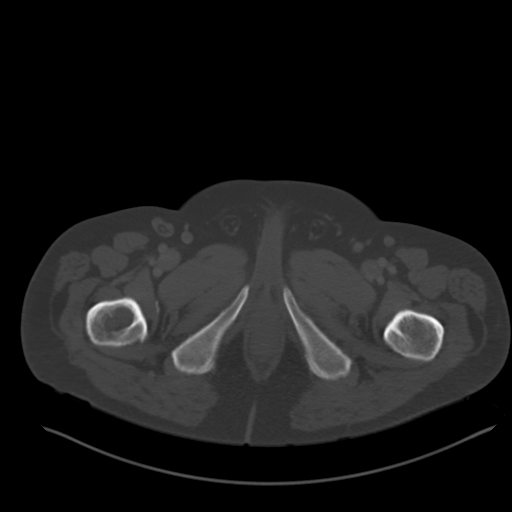
[im 11/97  soft-tissue]
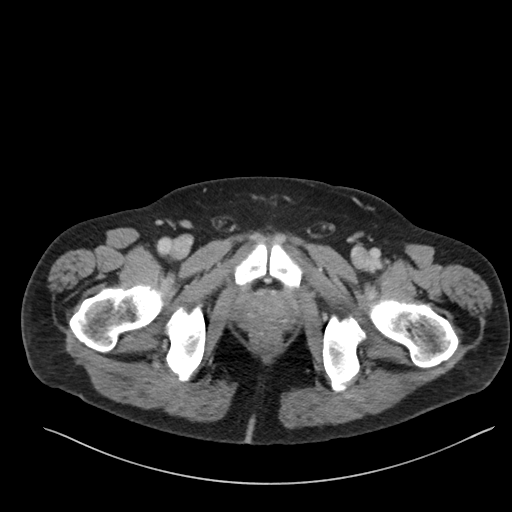
[im 22/97  soft-tissue]
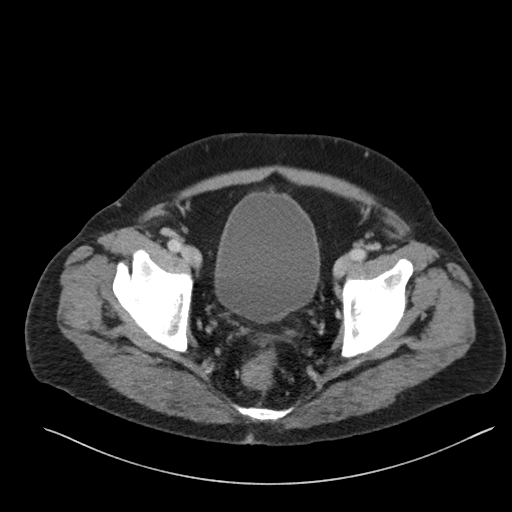
[im 27/97  soft-tissue]
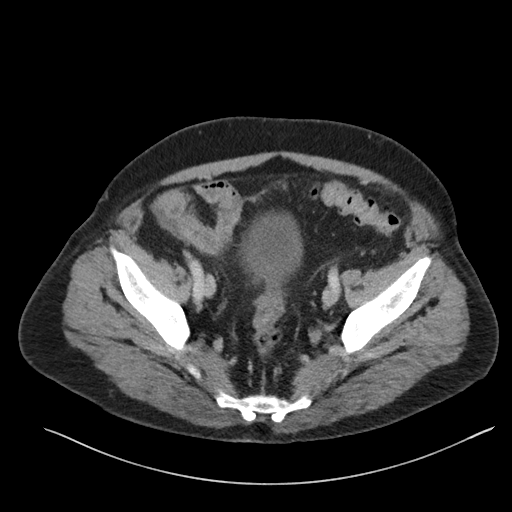
[im 33/97  soft-tissue]
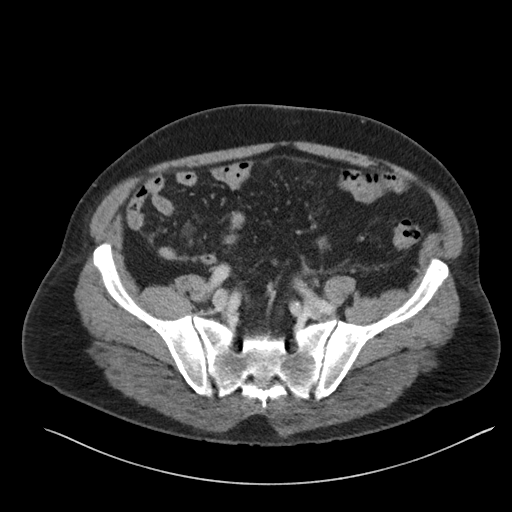
[im 43/97  soft-tissue]
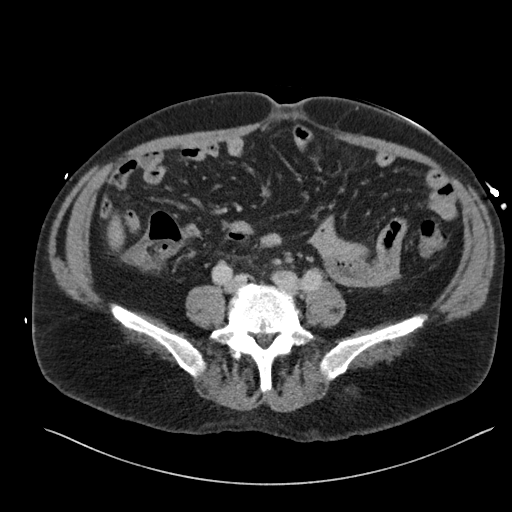
[im 49/97  soft-tissue]
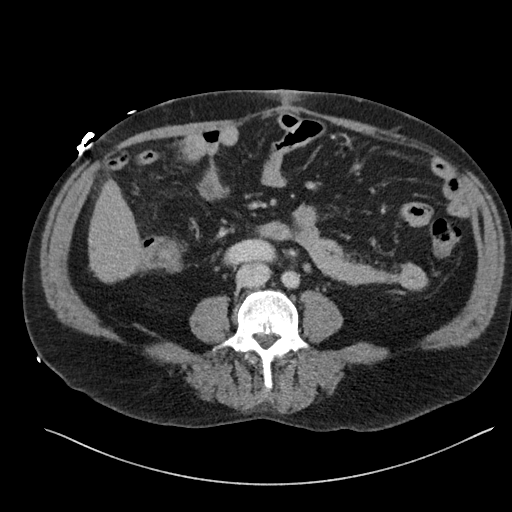
[im 54/97  soft-tissue]
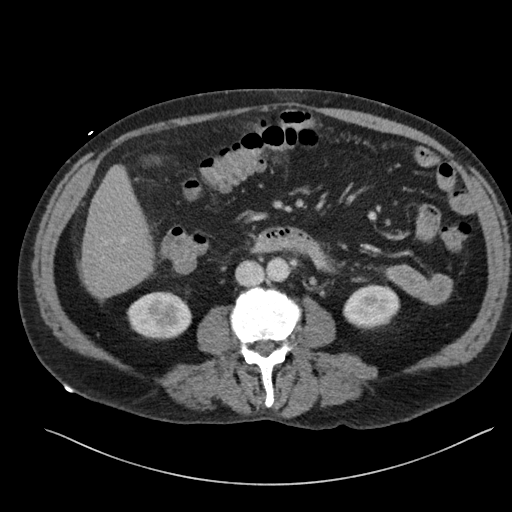
[im 65/97  soft-tissue]
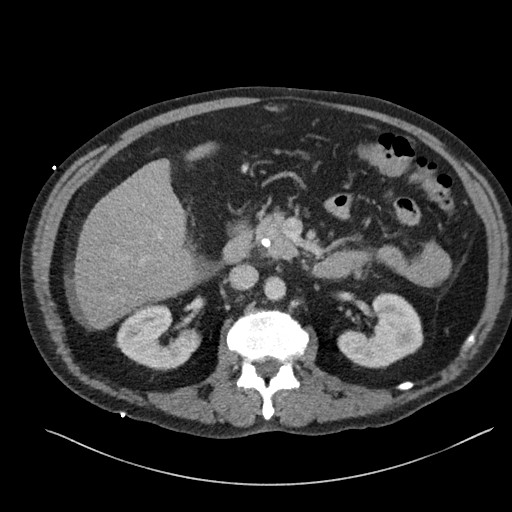
[im 65/97  bone]
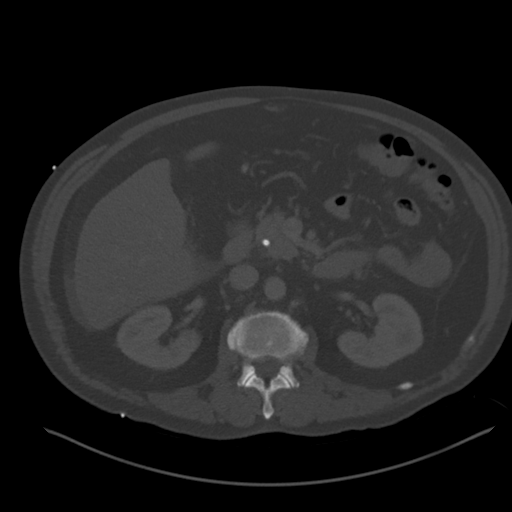
[im 70/97  soft-tissue]
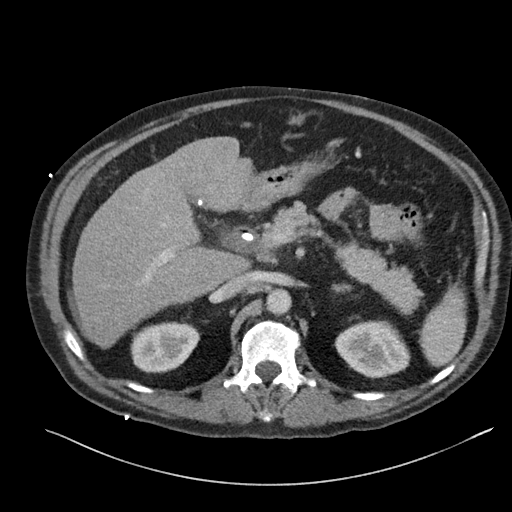
[im 75/97  soft-tissue]
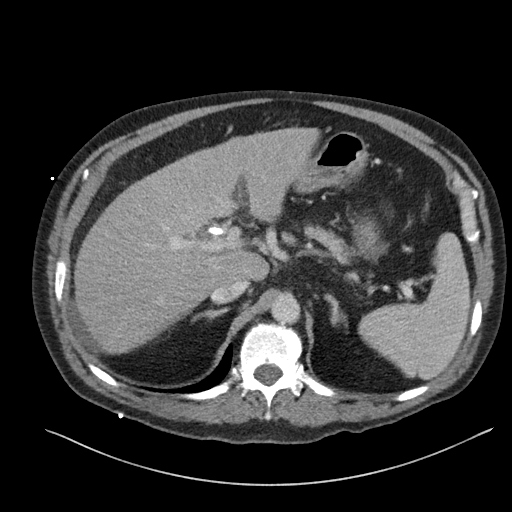
[im 86/97  soft-tissue]
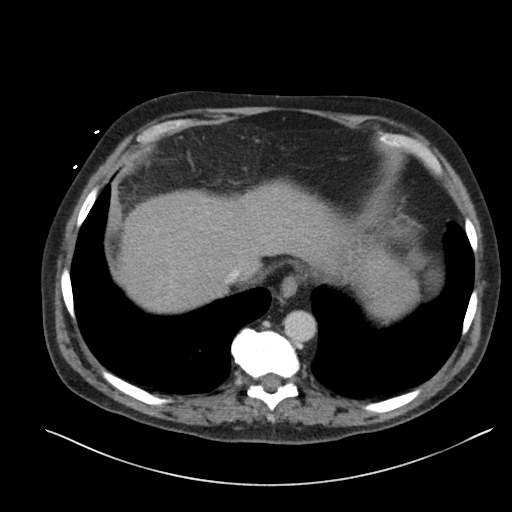
[im 91/97  soft-tissue]
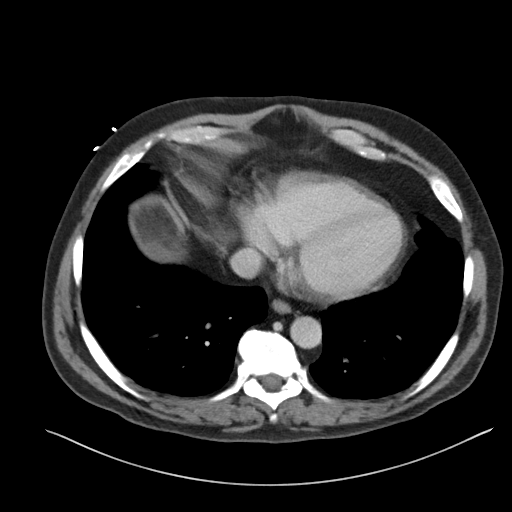

[Series 6: coronal soft tissue · coronal · 0.81mm/px · 3 of 101 slices shown]
[im 34/101  soft-tissue]
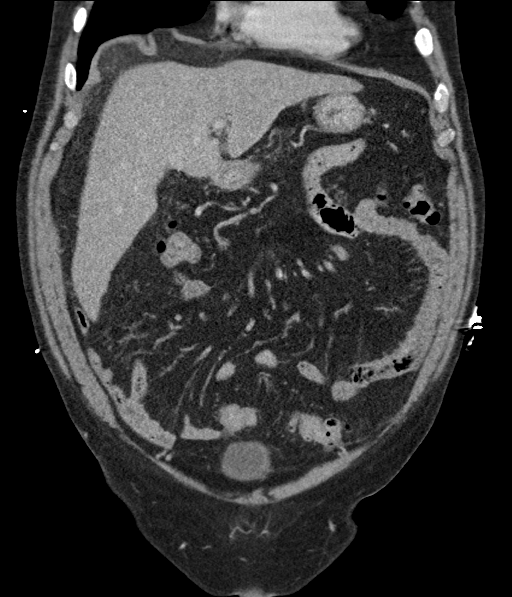
[im 45/101  soft-tissue]
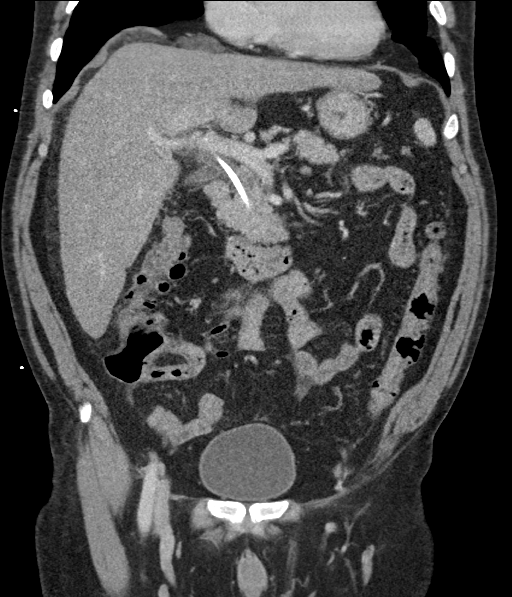
[im 56/101  soft-tissue]
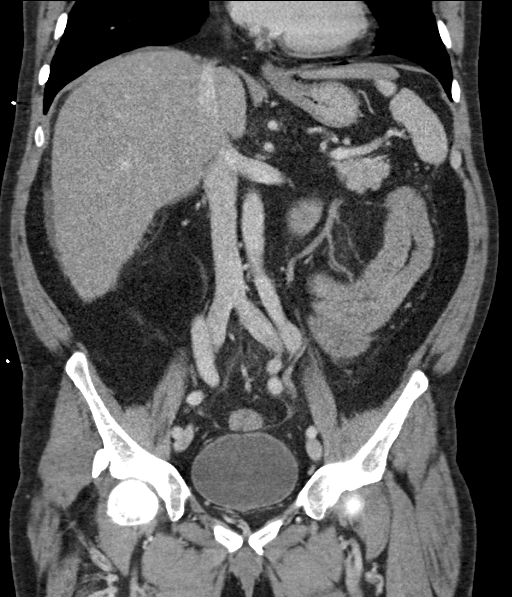

[16 of 46 positions shown; findings below may reference images not displayed]

FINDINGS: Lower chest: No acute abnormality.

Hepatobiliary: A stable 6 mm focus of parenchymal low attenuation is
seen within the anterior aspect of the right lobe of the liver
(axial CT image 38, CT series number 3) a very small amount of
perihepatic fluid is noted. Status post cholecystectomy. No biliary
dilatation. A common bile duct stent is in place.

Pancreas: Unremarkable. No pancreatic ductal dilatation or
surrounding inflammatory changes.

Spleen: Normal in size without focal abnormality.

Adrenals/Urinary Tract: Adrenal glands are unremarkable. Kidneys are
normal, without renal calculi, focal lesion, or hydronephrosis.
Bladder is unremarkable.

Stomach/Bowel: Stomach is within normal limits. Appendix appears
normal. No evidence of bowel wall thickening, distention, or
inflammatory changes. Noninflamed diverticula are seen within the
sigmoid colon.

Vascular/Lymphatic: No significant vascular findings are present. No
enlarged abdominal or pelvic lymph nodes.

Reproductive: The prostate gland is moderately enlarged.

Other: No abdominal wall hernia or abnormality. No abdominopelvic
ascites.

Musculoskeletal: Multilevel degenerative changes seen throughout the
lumbar spine.
IMPRESSION: 1. Evidence of prior cholecystectomy.
2. Common bile duct stent.
3. Noninflamed sigmoid diverticulosis.
4. Very small amount of perihepatic fluid, decreased in severity
when compared to the prior exam.
